# Patient Record
Sex: Male | Born: 1945 | Race: Black or African American | Hispanic: No | Marital: Single | Smoking: Current every day smoker
Health system: Southern US, Community
[De-identification: ages and names within clinical notes are randomized; demographics above are authoritative.]

## PROBLEM LIST (undated history)

## (undated) ENCOUNTER — Emergency Department (HOSPITAL_COMMUNITY): Payer: Medicare Other | Source: Home / Self Care

## (undated) DIAGNOSIS — R972 Elevated prostate specific antigen [PSA]: Secondary | ICD-10-CM

## (undated) DIAGNOSIS — I10 Essential (primary) hypertension: Secondary | ICD-10-CM

## (undated) DIAGNOSIS — Z951 Presence of aortocoronary bypass graft: Secondary | ICD-10-CM

## (undated) DIAGNOSIS — E162 Hypoglycemia, unspecified: Secondary | ICD-10-CM

## (undated) DIAGNOSIS — Z992 Dependence on renal dialysis: Secondary | ICD-10-CM

## (undated) DIAGNOSIS — M1990 Unspecified osteoarthritis, unspecified site: Secondary | ICD-10-CM

## (undated) DIAGNOSIS — K219 Gastro-esophageal reflux disease without esophagitis: Secondary | ICD-10-CM

## (undated) DIAGNOSIS — R0602 Shortness of breath: Secondary | ICD-10-CM

## (undated) DIAGNOSIS — N35919 Unspecified urethral stricture, male, unspecified site: Secondary | ICD-10-CM

## (undated) DIAGNOSIS — I4891 Unspecified atrial fibrillation: Secondary | ICD-10-CM

## (undated) DIAGNOSIS — C61 Malignant neoplasm of prostate: Secondary | ICD-10-CM

## (undated) DIAGNOSIS — I214 Non-ST elevation (NSTEMI) myocardial infarction: Secondary | ICD-10-CM

## (undated) DIAGNOSIS — R011 Cardiac murmur, unspecified: Secondary | ICD-10-CM

## (undated) DIAGNOSIS — K4090 Unilateral inguinal hernia, without obstruction or gangrene, not specified as recurrent: Secondary | ICD-10-CM

## (undated) DIAGNOSIS — N186 End stage renal disease: Secondary | ICD-10-CM

## (undated) DIAGNOSIS — N320 Bladder-neck obstruction: Secondary | ICD-10-CM

## (undated) DIAGNOSIS — Z720 Tobacco use: Secondary | ICD-10-CM

## (undated) DIAGNOSIS — E119 Type 2 diabetes mellitus without complications: Secondary | ICD-10-CM

## (undated) DIAGNOSIS — N189 Chronic kidney disease, unspecified: Secondary | ICD-10-CM

## (undated) DIAGNOSIS — I2510 Atherosclerotic heart disease of native coronary artery without angina pectoris: Secondary | ICD-10-CM

## (undated) DIAGNOSIS — E785 Hyperlipidemia, unspecified: Secondary | ICD-10-CM

## (undated) DIAGNOSIS — M2550 Pain in unspecified joint: Secondary | ICD-10-CM

## (undated) DIAGNOSIS — I509 Heart failure, unspecified: Secondary | ICD-10-CM

## (undated) HISTORY — DX: Chronic kidney disease, unspecified: N18.9

## (undated) HISTORY — DX: Essential (primary) hypertension: I10

## (undated) HISTORY — DX: Pain in unspecified joint: M25.50

## (undated) HISTORY — DX: Unspecified urethral stricture, male, unspecified site: N35.919

## (undated) HISTORY — DX: Bladder-neck obstruction: N32.0

## (undated) HISTORY — PX: KIDNEY SURGERY: SHX687

## (undated) HISTORY — DX: Elevated prostate specific antigen (PSA): R97.20

## (undated) HISTORY — PX: PELVIC LAPAROSCOPY: SHX162

## (undated) HISTORY — DX: Hyperlipidemia, unspecified: E78.5

## (undated) HISTORY — DX: Malignant neoplasm of prostate: C61

## (undated) HISTORY — PX: APPENDECTOMY: SHX54

---

## 2001-11-27 ENCOUNTER — Ambulatory Visit (HOSPITAL_COMMUNITY): Admission: RE | Admit: 2001-11-27 | Discharge: 2001-11-27 | Payer: Self-pay | Admitting: Family Medicine

## 2001-11-27 ENCOUNTER — Encounter: Payer: Self-pay | Admitting: Family Medicine

## 2002-10-22 ENCOUNTER — Emergency Department (HOSPITAL_COMMUNITY): Admission: EM | Admit: 2002-10-22 | Discharge: 2002-10-22 | Payer: Self-pay | Admitting: *Deleted

## 2003-04-23 ENCOUNTER — Ambulatory Visit (HOSPITAL_COMMUNITY): Admission: RE | Admit: 2003-04-23 | Discharge: 2003-04-23 | Payer: Self-pay | Admitting: Urology

## 2003-04-23 ENCOUNTER — Encounter (INDEPENDENT_AMBULATORY_CARE_PROVIDER_SITE_OTHER): Payer: Self-pay | Admitting: Specialist

## 2003-04-23 ENCOUNTER — Ambulatory Visit (HOSPITAL_BASED_OUTPATIENT_CLINIC_OR_DEPARTMENT_OTHER): Admission: RE | Admit: 2003-04-23 | Discharge: 2003-04-23 | Payer: Self-pay | Admitting: Urology

## 2003-09-19 ENCOUNTER — Ambulatory Visit: Payer: Self-pay | Admitting: Nurse Practitioner

## 2003-10-14 ENCOUNTER — Ambulatory Visit: Payer: Self-pay | Admitting: *Deleted

## 2003-10-22 ENCOUNTER — Ambulatory Visit: Payer: Self-pay | Admitting: Nurse Practitioner

## 2003-10-23 ENCOUNTER — Ambulatory Visit: Payer: Self-pay | Admitting: *Deleted

## 2004-05-20 ENCOUNTER — Ambulatory Visit: Payer: Self-pay | Admitting: Nurse Practitioner

## 2004-05-27 ENCOUNTER — Ambulatory Visit: Payer: Self-pay | Admitting: Nurse Practitioner

## 2004-06-19 ENCOUNTER — Emergency Department (HOSPITAL_COMMUNITY): Admission: EM | Admit: 2004-06-19 | Discharge: 2004-06-19 | Payer: Self-pay | Admitting: Emergency Medicine

## 2004-06-21 ENCOUNTER — Ambulatory Visit: Payer: Self-pay | Admitting: Nurse Practitioner

## 2004-11-30 ENCOUNTER — Ambulatory Visit: Payer: Self-pay | Admitting: Nurse Practitioner

## 2005-03-23 ENCOUNTER — Ambulatory Visit: Payer: Self-pay | Admitting: Nurse Practitioner

## 2005-04-25 ENCOUNTER — Ambulatory Visit: Payer: Self-pay | Admitting: Nurse Practitioner

## 2005-05-10 ENCOUNTER — Ambulatory Visit: Payer: Self-pay | Admitting: Internal Medicine

## 2005-10-14 ENCOUNTER — Ambulatory Visit: Payer: Self-pay | Admitting: Nurse Practitioner

## 2005-10-27 ENCOUNTER — Ambulatory Visit: Payer: Self-pay | Admitting: Nurse Practitioner

## 2005-11-10 ENCOUNTER — Ambulatory Visit: Payer: Self-pay | Admitting: Nurse Practitioner

## 2006-05-22 ENCOUNTER — Ambulatory Visit: Payer: Self-pay | Admitting: Nurse Practitioner

## 2006-06-14 ENCOUNTER — Ambulatory Visit: Payer: Self-pay | Admitting: Family Medicine

## 2006-09-27 ENCOUNTER — Encounter (INDEPENDENT_AMBULATORY_CARE_PROVIDER_SITE_OTHER): Payer: Self-pay | Admitting: *Deleted

## 2006-11-27 ENCOUNTER — Encounter (INDEPENDENT_AMBULATORY_CARE_PROVIDER_SITE_OTHER): Payer: Self-pay | Admitting: Nurse Practitioner

## 2006-11-27 ENCOUNTER — Ambulatory Visit: Payer: Self-pay | Admitting: Internal Medicine

## 2006-11-27 LAB — CONVERTED CEMR LAB
ALT: 17 units/L (ref 0–53)
AST: 19 units/L (ref 0–37)
Albumin: 4.5 g/dL (ref 3.5–5.2)
Alkaline Phosphatase: 104 units/L (ref 39–117)
BUN: 38 mg/dL — ABNORMAL HIGH (ref 6–23)
Basophils Absolute: 0 10*3/uL (ref 0.0–0.1)
Basophils Relative: 0 % (ref 0–1)
CO2: 23 meq/L (ref 19–32)
Calcium: 10.5 mg/dL (ref 8.4–10.5)
Chloride: 105 meq/L (ref 96–112)
Cholesterol: 151 mg/dL (ref 0–200)
Creatinine, Ser: 2.47 mg/dL — ABNORMAL HIGH (ref 0.40–1.50)
Eosinophils Absolute: 0.2 10*3/uL (ref 0.2–0.7)
Eosinophils Relative: 1 % (ref 0–5)
Glucose, Bld: 166 mg/dL — ABNORMAL HIGH (ref 70–99)
HCT: 47.3 % (ref 39.0–52.0)
HDL: 31 mg/dL — ABNORMAL LOW (ref >39)
Hemoglobin: 15.3 g/dL (ref 13.0–17.0)
LDL Cholesterol: 86 mg/dL (ref 0–99)
Lymphocytes Relative: 24 % (ref 12–46)
Lymphs Abs: 2.9 10*3/uL (ref 0.7–4.0)
MCHC: 32.3 g/dL (ref 30.0–36.0)
MCV: 98.5 fL (ref 78.0–100.0)
Monocytes Absolute: 0.6 10*3/uL (ref 0.1–1.0)
Monocytes Relative: 5 % (ref 3–12)
Neutro Abs: 8.6 10*3/uL — ABNORMAL HIGH (ref 1.7–7.7)
Neutrophils Relative %: 70 % (ref 43–77)
PSA: 2.86 ng/mL (ref 0.10–4.00)
Platelets: 240 10*3/uL (ref 150–400)
Potassium: 4.1 meq/L (ref 3.5–5.3)
RBC: 4.8 M/uL (ref 4.22–5.81)
RDW: 13.6 % (ref 11.5–15.5)
Sodium: 143 meq/L (ref 135–145)
TSH: 2.031 microintl units/mL (ref 0.350–5.50)
Total Bilirubin: 0.6 mg/dL (ref 0.3–1.2)
Total CHOL/HDL Ratio: 4.9
Total Protein: 7.2 g/dL (ref 6.0–8.3)
Triglycerides: 169 mg/dL — ABNORMAL HIGH (ref <150)
VLDL: 34 mg/dL (ref 0–40)
WBC: 12.2 10*3/uL — ABNORMAL HIGH (ref 4.0–10.5)

## 2006-11-28 ENCOUNTER — Encounter (INDEPENDENT_AMBULATORY_CARE_PROVIDER_SITE_OTHER): Payer: Self-pay | Admitting: Nurse Practitioner

## 2006-12-11 ENCOUNTER — Ambulatory Visit: Payer: Self-pay | Admitting: Internal Medicine

## 2007-01-03 ENCOUNTER — Ambulatory Visit: Payer: Self-pay | Admitting: Internal Medicine

## 2007-01-17 ENCOUNTER — Ambulatory Visit: Payer: Self-pay | Admitting: Internal Medicine

## 2007-01-17 ENCOUNTER — Encounter (INDEPENDENT_AMBULATORY_CARE_PROVIDER_SITE_OTHER): Payer: Self-pay | Admitting: Nurse Practitioner

## 2007-01-17 LAB — CONVERTED CEMR LAB
ALT: 12 units/L (ref 0–53)
AST: 14 units/L (ref 0–37)
Albumin: 4.2 g/dL (ref 3.5–5.2)
Alkaline Phosphatase: 88 units/L (ref 39–117)
BUN: 48 mg/dL — ABNORMAL HIGH (ref 6–23)
CO2: 22 meq/L (ref 19–32)
Calcium: 10.3 mg/dL (ref 8.4–10.5)
Chloride: 105 meq/L (ref 96–112)
Cholesterol: 172 mg/dL (ref 0–200)
Creatinine, Ser: 3.12 mg/dL — ABNORMAL HIGH (ref 0.40–1.50)
Glucose, Bld: 243 mg/dL — ABNORMAL HIGH (ref 70–99)
HDL: 37 mg/dL — ABNORMAL LOW (ref >39)
LDL Cholesterol: 103 mg/dL — ABNORMAL HIGH (ref 0–99)
Potassium: 4.4 meq/L (ref 3.5–5.3)
Sodium: 141 meq/L (ref 135–145)
Total Bilirubin: 0.3 mg/dL (ref 0.3–1.2)
Total CHOL/HDL Ratio: 4.6
Total Protein: 6.7 g/dL (ref 6.0–8.3)
Triglycerides: 159 mg/dL — ABNORMAL HIGH (ref <150)
VLDL: 32 mg/dL (ref 0–40)

## 2007-02-07 ENCOUNTER — Ambulatory Visit: Payer: Self-pay | Admitting: Family Medicine

## 2007-02-22 ENCOUNTER — Ambulatory Visit: Payer: Self-pay | Admitting: Internal Medicine

## 2007-03-13 ENCOUNTER — Encounter (INDEPENDENT_AMBULATORY_CARE_PROVIDER_SITE_OTHER): Payer: Self-pay | Admitting: Nurse Practitioner

## 2007-03-13 ENCOUNTER — Ambulatory Visit: Payer: Self-pay | Admitting: Internal Medicine

## 2007-03-13 LAB — CONVERTED CEMR LAB
ALT: 10 units/L (ref 0–53)
AST: 14 units/L (ref 0–37)
Albumin: 4.6 g/dL (ref 3.5–5.2)
Alkaline Phosphatase: 63 units/L (ref 39–117)
Bilirubin, Direct: 0.1 mg/dL (ref 0.0–0.3)
Cholesterol: 144 mg/dL (ref 0–200)
HDL: 33 mg/dL — ABNORMAL LOW (ref >39)
Indirect Bilirubin: 0.3 mg/dL (ref 0.0–0.9)
LDL Cholesterol: 85 mg/dL (ref 0–99)
Total Bilirubin: 0.4 mg/dL (ref 0.3–1.2)
Total CHOL/HDL Ratio: 4.4
Total Protein: 7.1 g/dL (ref 6.0–8.3)
Triglycerides: 131 mg/dL (ref <150)
VLDL: 26 mg/dL (ref 0–40)

## 2007-04-05 ENCOUNTER — Ambulatory Visit: Payer: Self-pay | Admitting: Internal Medicine

## 2007-04-30 ENCOUNTER — Ambulatory Visit: Payer: Self-pay | Admitting: Internal Medicine

## 2007-04-30 ENCOUNTER — Encounter (INDEPENDENT_AMBULATORY_CARE_PROVIDER_SITE_OTHER): Payer: Self-pay | Admitting: Nurse Practitioner

## 2007-04-30 LAB — CONVERTED CEMR LAB
BUN: 36 mg/dL — ABNORMAL HIGH (ref 6–23)
CO2: 21 meq/L (ref 19–32)
Calcium: 10.3 mg/dL (ref 8.4–10.5)
Chloride: 110 meq/L (ref 96–112)
Creatinine, Ser: 2.66 mg/dL — ABNORMAL HIGH (ref 0.40–1.50)
Glucose, Bld: 117 mg/dL — ABNORMAL HIGH (ref 70–99)
Potassium: 4 meq/L (ref 3.5–5.3)
Sodium: 143 meq/L (ref 135–145)

## 2007-05-15 ENCOUNTER — Ambulatory Visit: Payer: Self-pay | Admitting: Internal Medicine

## 2007-06-01 ENCOUNTER — Ambulatory Visit (HOSPITAL_COMMUNITY): Admission: RE | Admit: 2007-06-01 | Discharge: 2007-06-01 | Payer: Self-pay | Admitting: Nephrology

## 2007-07-12 ENCOUNTER — Ambulatory Visit: Payer: Self-pay | Admitting: Internal Medicine

## 2007-07-12 ENCOUNTER — Encounter (INDEPENDENT_AMBULATORY_CARE_PROVIDER_SITE_OTHER): Payer: Self-pay | Admitting: Nurse Practitioner

## 2007-07-12 LAB — CONVERTED CEMR LAB
ALT: 10 units/L (ref 0–53)
AST: 17 units/L (ref 0–37)
Albumin ELP: 60 % (ref 55.8–66.1)
Albumin: 4.6 g/dL (ref 3.5–5.2)
Alkaline Phosphatase: 83 units/L (ref 39–117)
Alpha-1-Globulin: 4.1 % (ref 2.9–4.9)
Alpha-2-Globulin: 11.6 % (ref 7.1–11.8)
BUN: 46 mg/dL — ABNORMAL HIGH (ref 6–23)
Beta Globulin: 5.9 % (ref 4.7–7.2)
CO2: 27 meq/L (ref 19–32)
Calcium, Total (PTH): 10.5 mg/dL (ref 8.4–10.5)
Calcium: 10.5 mg/dL (ref 8.4–10.5)
Chloride: 106 meq/L (ref 96–112)
Creatinine, Ser: 3.02 mg/dL — ABNORMAL HIGH (ref 0.40–1.50)
Ferritin: 362 ng/mL — ABNORMAL HIGH (ref 22–322)
Gamma Globulin: 13.1 % (ref 11.1–18.8)
Glucose, Bld: 111 mg/dL — ABNORMAL HIGH (ref 70–99)
Hemoglobin: 12.9 g/dL — ABNORMAL LOW (ref 13.0–17.0)
Iron: 55 ug/dL (ref 42–165)
PTH: 114.5 pg/mL — ABNORMAL HIGH (ref 14.0–72.0)
Phosphorus: 3.4 mg/dL (ref 2.3–4.6)
Potassium: 4 meq/L (ref 3.5–5.3)
Saturation Ratios: 17 % — ABNORMAL LOW (ref 20–55)
Sodium: 143 meq/L (ref 135–145)
TIBC: 324 ug/dL (ref 215–435)
Total Bilirubin: 0.4 mg/dL (ref 0.3–1.2)
Total Protein, Serum Electrophoresis: 7.4 g/dL (ref 6.0–8.3)
Total Protein: 7.4 g/dL (ref 6.0–8.3)
UIBC: 269 ug/dL

## 2007-07-19 ENCOUNTER — Encounter (INDEPENDENT_AMBULATORY_CARE_PROVIDER_SITE_OTHER): Payer: Self-pay | Admitting: Nurse Practitioner

## 2007-07-19 LAB — CONVERTED CEMR LAB
Albumin, U: DETECTED %
Alpha 1, Urine: DETECTED % — AB
Alpha 2, Urine: DETECTED % — AB
Beta, Urine: DETECTED % — AB
Free Kappa Lt Chains,Ur: 8.78 mg/dL — ABNORMAL HIGH (ref 0.04–1.51)
Free Kappa/Lambda Ratio: 7.26 — ABNORMAL HIGH (ref 0.46–4.00)
Free Lambda Lt Chains,Ur: 1.21 mg/dL — ABNORMAL HIGH (ref 0.08–1.01)
Gamma Globulin, Urine: DETECTED % — AB
Time: 24
Total Protein, Urine-Ur/day: 1451 mg/24hr — ABNORMAL HIGH (ref 10–140)
Volume, Urine: 1800 mL

## 2007-10-08 ENCOUNTER — Encounter (INDEPENDENT_AMBULATORY_CARE_PROVIDER_SITE_OTHER): Payer: Self-pay | Admitting: Family Medicine

## 2007-10-08 ENCOUNTER — Ambulatory Visit: Payer: Self-pay | Admitting: Family Medicine

## 2007-10-08 LAB — CONVERTED CEMR LAB
BUN: 44 mg/dL — ABNORMAL HIGH (ref 6–23)
CO2: 23 meq/L (ref 19–32)
Calcium: 9.7 mg/dL (ref 8.4–10.5)
Chloride: 106 meq/L (ref 96–112)
Cholesterol: 139 mg/dL (ref 0–200)
Creatinine, Ser: 3.02 mg/dL — ABNORMAL HIGH (ref 0.40–1.50)
HDL: 34 mg/dL — ABNORMAL LOW (ref >39)
Total CHOL/HDL Ratio: 4.1

## 2007-11-07 ENCOUNTER — Ambulatory Visit: Payer: Self-pay | Admitting: Internal Medicine

## 2007-12-19 ENCOUNTER — Encounter (INDEPENDENT_AMBULATORY_CARE_PROVIDER_SITE_OTHER): Payer: Self-pay | Admitting: Family Medicine

## 2007-12-19 ENCOUNTER — Ambulatory Visit: Payer: Self-pay | Admitting: Internal Medicine

## 2007-12-19 LAB — CONVERTED CEMR LAB
Albumin: 4.4 g/dL (ref 3.5–5.2)
BUN: 43 mg/dL — ABNORMAL HIGH (ref 6–23)
CO2: 22 meq/L (ref 19–32)
Calcium: 9.9 mg/dL (ref 8.4–10.5)
Chloride: 106 meq/L (ref 96–112)
Glucose, Bld: 146 mg/dL — ABNORMAL HIGH (ref 70–99)
HDL: 35 mg/dL — ABNORMAL LOW (ref >39)
PSA: 3.96 ng/mL (ref 0.10–4.00)
Potassium: 4.1 meq/L (ref 3.5–5.3)
Triglycerides: 95 mg/dL (ref <150)

## 2008-02-04 ENCOUNTER — Ambulatory Visit: Payer: Self-pay | Admitting: Internal Medicine

## 2008-03-07 ENCOUNTER — Ambulatory Visit: Payer: Self-pay | Admitting: Internal Medicine

## 2008-03-14 ENCOUNTER — Ambulatory Visit: Payer: Self-pay | Admitting: Internal Medicine

## 2008-03-24 ENCOUNTER — Ambulatory Visit: Payer: Self-pay | Admitting: Internal Medicine

## 2008-03-24 ENCOUNTER — Encounter (INDEPENDENT_AMBULATORY_CARE_PROVIDER_SITE_OTHER): Payer: Self-pay | Admitting: Internal Medicine

## 2008-03-24 LAB — CONVERTED CEMR LAB
HDL: 35 mg/dL — ABNORMAL LOW (ref >39)
LDL Cholesterol: 104 mg/dL — ABNORMAL HIGH (ref 0–99)
Total CHOL/HDL Ratio: 4.6

## 2008-03-28 ENCOUNTER — Encounter (INDEPENDENT_AMBULATORY_CARE_PROVIDER_SITE_OTHER): Payer: Self-pay | Admitting: *Deleted

## 2008-04-02 ENCOUNTER — Ambulatory Visit: Payer: Self-pay | Admitting: Internal Medicine

## 2008-05-14 ENCOUNTER — Ambulatory Visit: Payer: Self-pay | Admitting: Internal Medicine

## 2008-06-11 ENCOUNTER — Ambulatory Visit: Payer: Self-pay | Admitting: Internal Medicine

## 2008-07-23 ENCOUNTER — Ambulatory Visit: Payer: Self-pay | Admitting: Internal Medicine

## 2008-09-17 ENCOUNTER — Ambulatory Visit: Payer: Self-pay | Admitting: Internal Medicine

## 2008-10-19 ENCOUNTER — Emergency Department (HOSPITAL_COMMUNITY): Admission: EM | Admit: 2008-10-19 | Discharge: 2008-10-19 | Payer: Self-pay | Admitting: Emergency Medicine

## 2008-12-17 ENCOUNTER — Ambulatory Visit: Payer: Self-pay | Admitting: Internal Medicine

## 2008-12-17 ENCOUNTER — Encounter (INDEPENDENT_AMBULATORY_CARE_PROVIDER_SITE_OTHER): Payer: Self-pay | Admitting: Internal Medicine

## 2008-12-17 LAB — CONVERTED CEMR LAB
Albumin: 4.5 g/dL (ref 3.5–5.2)
Basophils Absolute: 0 10*3/uL (ref 0.0–0.1)
Calcium: 10.3 mg/dL (ref 8.4–10.5)
Chloride: 107 meq/L (ref 96–112)
Creatinine, Ser: 3.86 mg/dL — ABNORMAL HIGH (ref 0.40–1.50)
Eosinophils Absolute: 0.2 10*3/uL (ref 0.0–0.7)
Lymphs Abs: 2.5 10*3/uL (ref 0.7–4.0)
MCV: 95.8 fL (ref 78.0–100.0)
Neutrophils Relative %: 65 % (ref 43–77)
PTH: 178.2 pg/mL — ABNORMAL HIGH (ref 14.0–72.0)
Phosphorus: 3 mg/dL (ref 2.3–4.6)
Platelets: 236 10*3/uL (ref 150–400)
WBC: 9.6 10*3/uL (ref 4.0–10.5)

## 2009-01-27 ENCOUNTER — Ambulatory Visit: Payer: Self-pay | Admitting: Family Medicine

## 2009-02-24 ENCOUNTER — Ambulatory Visit: Payer: Self-pay | Admitting: Internal Medicine

## 2009-02-24 LAB — CONVERTED CEMR LAB
BUN: 38 mg/dL — ABNORMAL HIGH (ref 6–23)
Calcium: 10.4 mg/dL (ref 8.4–10.5)
Creatinine, Ser: 3.44 mg/dL — ABNORMAL HIGH (ref 0.40–1.50)
Potassium: 4.2 meq/L (ref 3.5–5.3)

## 2009-04-14 ENCOUNTER — Ambulatory Visit: Payer: Self-pay | Admitting: Internal Medicine

## 2009-04-14 LAB — CONVERTED CEMR LAB
Calcium: 10.2 mg/dL (ref 8.4–10.5)
Potassium: 4.5 meq/L (ref 3.5–5.3)
Sodium: 143 meq/L (ref 135–145)

## 2009-04-22 ENCOUNTER — Ambulatory Visit: Payer: Self-pay | Admitting: Internal Medicine

## 2009-06-05 ENCOUNTER — Ambulatory Visit: Payer: Self-pay | Admitting: Internal Medicine

## 2009-06-05 LAB — CONVERTED CEMR LAB
CO2: 22 meq/L (ref 19–32)
Glucose, Bld: 136 mg/dL — ABNORMAL HIGH (ref 70–99)
Potassium: 4.1 meq/L (ref 3.5–5.3)
Sodium: 140 meq/L (ref 135–145)

## 2009-06-15 ENCOUNTER — Ambulatory Visit: Payer: Self-pay | Admitting: Internal Medicine

## 2009-06-15 LAB — CONVERTED CEMR LAB
CO2: 23 meq/L (ref 19–32)
CRP: 0.2 mg/dL (ref <0.6)
Calcium: 10.2 mg/dL (ref 8.4–10.5)
Chloride: 109 meq/L (ref 96–112)
Cholesterol: 137 mg/dL (ref 0–200)
Creatinine, Ser: 4.07 mg/dL — ABNORMAL HIGH (ref 0.40–1.50)
Glucose, Bld: 94 mg/dL (ref 70–99)
Total Bilirubin: 0.4 mg/dL (ref 0.3–1.2)
Total CHOL/HDL Ratio: 3.9
Total Protein: 7.3 g/dL (ref 6.0–8.3)
Triglycerides: 95 mg/dL (ref <150)
VLDL: 19 mg/dL (ref 0–40)

## 2010-01-10 HISTORY — PX: AV FISTULA PLACEMENT, BRACHIOCEPHALIC: SHX1207

## 2010-01-18 ENCOUNTER — Encounter (INDEPENDENT_AMBULATORY_CARE_PROVIDER_SITE_OTHER): Payer: Self-pay | Admitting: *Deleted

## 2010-01-18 LAB — CONVERTED CEMR LAB
BUN: 43 mg/dL — ABNORMAL HIGH (ref 6–23)
Calcium: 10.9 mg/dL — ABNORMAL HIGH (ref 8.4–10.5)
Chloride: 103 meq/L (ref 96–112)
Creatinine, Ser: 3.89 mg/dL — ABNORMAL HIGH (ref 0.40–1.50)
Ferritin: 199 ng/mL (ref 22–322)
Hemoglobin: 13.5 g/dL (ref 13.0–17.0)
Iron: 57 ug/dL (ref 42–165)
Phosphorus: 3.8 mg/dL (ref 2.3–4.6)
Saturation Ratios: 19 % — ABNORMAL LOW (ref 20–55)
TIBC: 305 ug/dL (ref 215–435)

## 2010-01-29 ENCOUNTER — Encounter (INDEPENDENT_AMBULATORY_CARE_PROVIDER_SITE_OTHER): Payer: Self-pay | Admitting: Family Medicine

## 2010-01-29 LAB — CONVERTED CEMR LAB: PSA: 4.52 ng/mL — ABNORMAL HIGH (ref <=4.00)

## 2010-01-30 ENCOUNTER — Encounter (INDEPENDENT_AMBULATORY_CARE_PROVIDER_SITE_OTHER): Payer: Self-pay | Admitting: Family Medicine

## 2010-02-11 ENCOUNTER — Ambulatory Visit: Admit: 2010-02-11 | Payer: Self-pay | Admitting: Vascular Surgery

## 2010-02-11 ENCOUNTER — Ambulatory Visit (INDEPENDENT_AMBULATORY_CARE_PROVIDER_SITE_OTHER): Payer: Self-pay | Admitting: Vascular Surgery

## 2010-02-11 ENCOUNTER — Encounter (INDEPENDENT_AMBULATORY_CARE_PROVIDER_SITE_OTHER): Payer: Self-pay

## 2010-02-11 DIAGNOSIS — Z01810 Encounter for preprocedural cardiovascular examination: Secondary | ICD-10-CM

## 2010-02-11 DIAGNOSIS — N186 End stage renal disease: Secondary | ICD-10-CM

## 2010-02-11 DIAGNOSIS — N184 Chronic kidney disease, stage 4 (severe): Secondary | ICD-10-CM

## 2010-02-19 NOTE — Procedures (Unsigned)
CEPHALIC VEIN MAPPING  INDICATION:  Stage IV chronic kidney disease.  HISTORY:  EXAM: The right cephalic vein is compressible.  Diameter measurements range from 0.21 to 0.60 cm.  The right basilic vein is compressible.  Diameter measurements range from 0.38 to 0.55 cm.  The left cephalic vein is compressible.  Diameter measurements range from 0.26 to 0.49 cm.  The left basilic vein is compressible.  Diameter measurements range from 0.15 to 0.49 cm.  See attached worksheet for all measurements.  IMPRESSION:  Patent bilateral cephalic and basilic veins with diameter measurements as described above.  ___________________________________________ Janetta Hora. Fields, MD  CH/MEDQ  D:  02/11/2010  T:  02/11/2010  Job:  [id number]

## 2010-02-22 NOTE — Assessment & Plan Note (Signed)
OFFICE VISIT  [name] [name], [name] [name] DOB:  08-26-1945                                       02/11/2010 QIONG#:[id number]  CHIEF COMPLAINT:  Needs dialysis access.  HISTORY OF PRESENT ILLNESS:  The patient is a 65 year old male referred by Dr. Kathrene Bongo for placement of long-term hemodialysis access.  The patient had no prior access procedures.  He is currently not on dialysis.  He is right-handed.  Most recent serum creatinine on the records reviewed from Dr. Jon Gills office was 3.89 on January 18, 2010.  CHRONIC MEDICAL PROBLEMS:  Hypertension, renal insufficiency, diabetes. These are currently controlled and followed by Dr. Kathrene Bongo.  He also has a history of elevated cholesterol.  SOCIAL HISTORY:  He is single.  He has 4 children.  He smokes 1 pack of cigarettes per day.  He does not consume alcohol regularly.  FAMILY HISTORY:  Remarkable for diabetes and renal failure in his brother.  REVIEW OF SYSTEMS:  A full 12-point review of systems was performed with the patient today.  This was remarkable for some pain in his legs with walking and some recent weight gain.  All other systems were negative.  MEDICATIONS:  Avapro, aspirin, clonidine, allopurinol, Norvasc, Actos, Protonix, Lipitor, vitamin E, Amaryl, lisinopril, metoprolol, TriCor, hydralazine and Lasix.  ALLERGIES:  He has known drug allergies.  PHYSICAL EXAMINATION:  Blood pressure is 158/89 in the left arm, heart rate is 88 and regular, oxygen saturation is 99% on room air, respirations 16.  HEENT:  Unremarkable.  Neck has 2+ carotid pulses without bruit.  Chest:  Clear to auscultation.  Cardiac exam is regular rate rhythm with a 3/6 systolic murmur heard best in the right side of the chest.  Abdomen is soft, nontender, nondistended.  No masses. Extremities:  He has 2+ radial and 2+ femoral pulses bilaterally. Musculoskeletal exam shows no major obvious joint  deformities. Neurologic exam shows symmetric upper extremity and lower extremity motor strength which is 5/5.  Skin has no open ulcers or rashes.  On exam of the upper extremities bilaterally on placement of the tourniquet, cephalic vein is difficult to palpate.  He had a vein mapping ultrasound today, which shows the cephalic vein in the forearm is fairly small and inconsistent bilaterally.  However, the upper arm cephalic vein was between 33 mm and 49 mm in diameter on the left side and between 32 mm and 68 mm in diameter of the right side. The basilic vein was also greater than 3 mm bilaterally.  I believe the best option for the patient initially would be placement of left brachiocephalic AV fistula.  We have scheduled this for March 02, 2010.  Risks, benefits, possible complications and procedure details were explained to the patient today including bit not limited to non- maturation of the fistula, infection, bleeding, ischemic steal.  He understands and agrees to proceed.    Janetta Hora. Shakoya Gilmore, MD Electronically Signed  CEF/MEDQ  D:  02/11/2010  T:  02/12/2010  Job:  [id number]  cc:   Cecille Aver, M.D.

## 2010-02-25 ENCOUNTER — Ambulatory Visit (HOSPITAL_COMMUNITY)
Admission: RE | Admit: 2010-02-25 | Discharge: 2010-02-25 | Disposition: A | Discharge status: Home / Self Care | Payer: Self-pay | Source: Ambulatory Visit | Attending: Vascular Surgery | Admitting: Vascular Surgery

## 2010-02-25 ENCOUNTER — Other Ambulatory Visit: Payer: Self-pay | Admitting: Vascular Surgery

## 2010-02-25 ENCOUNTER — Encounter (HOSPITAL_COMMUNITY)
Admission: RE | Admit: 2010-02-25 | Discharge: 2010-02-25 | Disposition: A | Discharge status: Home / Self Care | Payer: Self-pay | Source: Ambulatory Visit | Attending: Vascular Surgery | Admitting: Vascular Surgery

## 2010-02-25 DIAGNOSIS — Z01818 Encounter for other preprocedural examination: Secondary | ICD-10-CM | POA: Insufficient documentation

## 2010-02-25 DIAGNOSIS — N186 End stage renal disease: Secondary | ICD-10-CM

## 2010-02-25 DIAGNOSIS — I120 Hypertensive chronic kidney disease with stage 5 chronic kidney disease or end stage renal disease: Secondary | ICD-10-CM | POA: Insufficient documentation

## 2010-02-25 LAB — SURGICAL PCR SCREEN: Staphylococcus aureus: NEGATIVE

## 2010-02-25 LAB — POCT I-STAT 4, (NA,K, GLUC, HGB,HCT): Hemoglobin: 12.9 g/dL — ABNORMAL LOW (ref 13.0–17.0)

## 2010-03-02 ENCOUNTER — Ambulatory Visit (HOSPITAL_COMMUNITY)
Admission: RE | Admit: 2010-03-02 | Discharge: 2010-03-02 | Disposition: A | Discharge status: Home / Self Care | Payer: Self-pay | Source: Ambulatory Visit | Attending: Vascular Surgery | Admitting: Vascular Surgery

## 2010-03-02 DIAGNOSIS — Z01810 Encounter for preprocedural cardiovascular examination: Secondary | ICD-10-CM | POA: Insufficient documentation

## 2010-03-02 DIAGNOSIS — J449 Chronic obstructive pulmonary disease, unspecified: Secondary | ICD-10-CM | POA: Insufficient documentation

## 2010-03-02 DIAGNOSIS — N186 End stage renal disease: Secondary | ICD-10-CM | POA: Insufficient documentation

## 2010-03-02 DIAGNOSIS — I120 Hypertensive chronic kidney disease with stage 5 chronic kidney disease or end stage renal disease: Secondary | ICD-10-CM | POA: Insufficient documentation

## 2010-03-02 DIAGNOSIS — K219 Gastro-esophageal reflux disease without esophagitis: Secondary | ICD-10-CM | POA: Insufficient documentation

## 2010-03-02 DIAGNOSIS — F17200 Nicotine dependence, unspecified, uncomplicated: Secondary | ICD-10-CM | POA: Insufficient documentation

## 2010-03-02 DIAGNOSIS — J4489 Other specified chronic obstructive pulmonary disease: Secondary | ICD-10-CM | POA: Insufficient documentation

## 2010-03-02 DIAGNOSIS — E119 Type 2 diabetes mellitus without complications: Secondary | ICD-10-CM | POA: Insufficient documentation

## 2010-03-02 LAB — GLUCOSE, CAPILLARY
Glucose-Capillary: 157 mg/dL — ABNORMAL HIGH (ref 70–99)
Glucose-Capillary: 162 mg/dL — ABNORMAL HIGH (ref 70–99)

## 2010-03-03 NOTE — Op Note (Signed)
  NAME:  [name] [name], [name] [name]                ACCOUNT NO.:  [id number]  MEDICAL RECORD NO.:  [id number]           PATIENT TYPE:  O  LOCATION:  [facility]                         FACILITY:  [facility]  PHYSICIAN:  Janetta Hora. Fields, MD  DATE OF BIRTH:  [birth year]  DATE OF PROCEDURE:  03/02/2010 DATE OF DISCHARGE:                              OPERATIVE REPORT   PROCEDURE:  Left brachiocephalic AV fistula.  PREOPERATIVE DIAGNOSIS:  End-stage renal disease.  POSTOPERATIVE DIAGNOSIS:  End-stage renal disease.  ANESTHESIA:  Local with IV sedation.  ASSISTANT:  Pecola Leisure, PA  OPERATIVE FINDINGS:  3.5-mm cephalic vein.  OPERATIVE DETAILS:  After obtaining informed consent, the patient was taken to the operating room.  The patient was placed in supine position on the operating table.  After adequate sedation, the patient's entire left upper extremity was prepped and draped in usual sterile fashion. Local anesthesia was infiltrated near the left antecubital crease.  A transverse incision was made in this location and carried down through the subcutaneous tissues down to the level of the cephalic vein.  The cephalic vein was of good quality, approximately 3.5 mm in diameter. This was dissected free circumferentially.  Small side branches were ligated and divided between silk ties.  Next, brachial artery was dissected free in the medial portion incision.  This was also of good quality approximately 3.5-4 mm in diameter.  This was dissected free circumferentially and small side branch was ligated and divided between silk ties or clips.  The patient was given 5000 units of intravenous heparin.  The distal cephalic vein was ligated with 2-0 silk tie and the vein was transected and swung over the level of the artery.  The artery was then controlled proximally and distally with vessel loops.  A longitudinal opening was made in the brachial artery and vein was then sewn end of vein to side of artery using  a running 7-0 Prolene suture. Just prior to completion anastomosis, this was fore bled, back bled, and thoroughly flushed.  Anastomosis was secured.  Vessel loops were released.  There was a palpable thrill in the fistula immediately.  The patient also had a palpable radial pulse.  Hemostasis was obtained. Subcutaneous tissues were reapproximated using running 3-0 Vicryl suture.  The skin was closed with 4-0 Vicryl subcuticular stitch.  The patient tolerated the procedure well and there were no complications.  Instrument, sponge, and needle counts were correct at the end of the case.  The patient was taken to the recovery room in stable condition.     Janetta Hora. Fields, MD     CEF/MEDQ  D:  03/02/2010  T:  03/02/2010  Job:  [id number]  Electronically Signed by Fabienne Bruns MD on 03/03/2010 03:09:43 PM

## 2010-04-01 ENCOUNTER — Ambulatory Visit (INDEPENDENT_AMBULATORY_CARE_PROVIDER_SITE_OTHER): Payer: Self-pay

## 2010-04-01 DIAGNOSIS — N186 End stage renal disease: Secondary | ICD-10-CM

## 2010-04-01 NOTE — Assessment & Plan Note (Signed)
OFFICE VISIT  MERCED, HANNERS DOB:  June 27, 1945                                       04/01/2010 JXBJY#:[id number]  CHIEF COMPLAINT:  Followup left brachial cephalic AV fistula.  HISTORY OF PRESENT ILLNESS:  This patient is a 65 year old gentleman with chronic renal insufficiency and with a GFR of 25 who had a left brachial cephalic AV fistula placed on 30/86/5784 by Dr. Darrick Penna.  The patient has been doing well with no signs of steal.  He has good motion and sensation in his hand and is able to use it normally.  PHYSICAL EXAMINATION:  This is a well-developed, well-nourished gentleman in no acute distress.  His heart rate is 84, saturations are 96% and his respiratory rate is 10.  Bilateral upper extremities are warm and pink.  He has good strength in the left hand.  His left hand is warm and pink.  He has 3+ radial pulse.  He has a good thrill and bruit in the left upper arm AV fistula.  The vein is somewhat tortuous but easily palpable.  It is approximately 4 mm in size.  ASSESSMENT:  Well maturing left brachial cephalic AV fistula which is slightly tortuous.  There are no signs of steal in the left upper extremity.  The patient will follow up as needed as he is presently not on hemodialysis.  Della Goo, PA-C  Charles E. Fields, MD Electronically Signed  RR/MEDQ  D:  04/01/2010  T:  04/01/2010  Job:  [id number]

## 2010-05-28 NOTE — Op Note (Signed)
NAME:  [name] [name], BARBERI NO.:  [id number]   MEDICAL RECORD NO.:  [id number]                   PATIENT TYPE:  AMB   LOCATION:  [facility]                                 FACILITY:  [facility]   PHYSICIAN:  Maretta Bees. Vonita Moss, M.D.             DATE OF BIRTH:  10/11/1945   DATE OF PROCEDURE:  04/23/2003  DATE OF DISCHARGE:                                 OPERATIVE REPORT   PREOPERATIVE DIAGNOSES:  1. Phimosis.  2. Balanitis.   POSTOPERATIVE DIAGNOSES:  1. Phimosis.  2. Balanitis.   OPERATION/PROCEDURE:  Circumcision.   SURGEON:  Maretta Bees. Vonita Moss, M.D.   ASSISTANT:  Thyra Breed, M.D.   ANESTHESIA:  General endotracheal anesthesia.   DRAINS:  None.   COMPLICATIONS:  None.   INDICATIONS FOR PROCEDURE:  [name] is a very pleasant 65 year old male  with increasing inability to withdraw his foreskin over the past three  months.  In addition to his phimosis, he has noted occasional purulent  drainage as well as dysuria consistent with balanitis.  He has medical  problems including diabetes mellitus which may have likely lead to his  phimosis as well as hypertension.  At this time [name] has been consented  on the risks, benefits and alternatives of undergoing a circumcision.  The  patient understands these risks and is willing to proceed.   DESCRIPTION OF PROCEDURE:  Following identification by his arm bracelet, the  patient was brought to the operating room and placed in the supine position.  He received preoperative IV antibiotics and his genitalia were shaved.  His  genitalia were then prepped and draped in the usual sterile fashion.  The  patient's foreskin was unable to be retracted initially.  The marking pen  was used to create a circumcising incision along the outline of the corona  beneath the penile skin.  Scalpel was used to sharply incise the foreskin.  Bovie electrocautery was used to obtain hemostasis.  We then turned our  attention to the  distal aspect of the phallus.  Two hemostats were used to  elevate the foreskin away from the glans.  Metzenbaum scissors were then  used to incise the foreskin in the midline to the level of the glans.  Prior  to proceeding, the glans and inner foreskin were then prepped again on the  operating room table with Betadine solution.  The inner aspect of the  foreskin was quite rugated and not smooth in appearance.  This was likely as  a result of the patient's balanitis.  However, once the specimen was  removed, it was sent for pathologic analysis.  The remainder of the foreskin  was then affixed on all four corners by hemostats.  Metzenbaum scissors were  then used to sharply remove the intervening piece of foreskin.  Once the  specimen was passed from the table, Bovie electrocautery was used to obtain  hemostasis, isolating any  active or potential bleeding on the penile shaft.  Once excellent hemostasis was obtained, a 4-0 chromic suture was used to  first reapproximate the frenulum and then create a U stitch to affix the  ventral midline.  A second 4-0 interrupted chromic suture was used to affix  the foreskin to the corona of the glans and the ventral midline.  Lateral  interrupted chromic sutures were then placed.  The intervening segments were  then closed using running 4-0 chromic suture.  The penile shaft skin was  then injected with 0.5% Marcaine.  The penis was then washed and dried.  Vaseline gauze was used to loosely wrap the suture line.  Rolling gauze was  then applied.  Finally a piece of Coban was used to complete the dressing.  Vaseline and bacitracin were then applied to the tip of the penis.  This  marked termination of the procedure.  The patient tolerated the procedure  well and there were no complications.   Please note that Dr. Vonita Moss was present and participated in the entire  procedure as he was the responsible surgeon.   DISPOSITION:  After awakening from general  anesthesia, the patient was  transported to the post anesthesia care unit in stable condition.  From  there the patient would be discharged to home.     Thyra Breed, MD                            Maretta Bees. Vonita Moss, M.D.    EG/MEDQ  D:  04/23/2003  T:  04/23/2003  Job:  [id number]

## 2010-06-03 ENCOUNTER — Encounter (INDEPENDENT_AMBULATORY_CARE_PROVIDER_SITE_OTHER): Payer: Self-pay

## 2010-06-03 ENCOUNTER — Ambulatory Visit (INDEPENDENT_AMBULATORY_CARE_PROVIDER_SITE_OTHER): Payer: Self-pay | Admitting: Vascular Surgery

## 2010-06-03 DIAGNOSIS — T82598A Other mechanical complication of other cardiac and vascular devices and implants, initial encounter: Secondary | ICD-10-CM

## 2010-06-03 DIAGNOSIS — N186 End stage renal disease: Secondary | ICD-10-CM

## 2010-06-04 NOTE — Assessment & Plan Note (Signed)
OFFICE VISIT  [name] [name], [name] [name] DOB:  Jan 27, 1945                                       06/03/2010 EAVWU#:[id number]  The patient returns today for followup after placement of a left brachiocephalic AV fistula on 03/02/2010.  The fistula is not currently being used.  He denies any steal symptoms in the arm.  CHRONIC MEDICAL PROBLEMS:  Continue to remain renal insufficiency, hypertension and diabetes.  These are controlled and followed by Dr. Kathrene Bongo.  REVIEW OF SYSTEMS:  He denies any shortness of breath.  He denies any chest pain.  PHYSICAL EXAM:  Vital signs:  Blood pressure is 124/73 in the right arm, heart rate 72 and regular, respirations 18.  Left upper extremity: There is an easily palpable thrill in the fistula.  It is palpable throughout most of the upper arm.  Left hand is pink, warm and well- perfused.  Neurologic:  Shows intact sensation and motor function in the left hand.  He had a duplex ultrasound of the left upper extremity today to analyze his fistula.  I reviewed and interpreted the study.  This shows that the fistula diameter is between 6 and 10 mm.  The depth from the skin is less than 1 cm throughout its entire course.  At this point I believe the patient's fistula is mature and ready for use at any time necessary.  He will follow up with [place] on an as-needed basis.    Janetta Hora. Cedric Mcclaine, MD Electronically Signed  CEF/MEDQ  D:  06/03/2010  T:  06/04/2010  Job:  [id number]

## 2010-06-10 NOTE — Procedures (Unsigned)
VASCULAR LAB EXAM  INDICATION:  A 60-month follow-up of left arm AV fistula.  HISTORY:  EXAM:  Left arm AV fistula duplex.  IMPRESSION: 1. Patent left brachial to cephalic arteriovenous fistula with no     focal increase in velocity or internal narrowing noted at the     anastomosis level or within the inflow or outflow vessels. 2. The antegrade left radial artery appears within normal limits. 3. Depth, diameter, velocity, and outflow vein branch measurements are     noted on the attached worksheet.  ___________________________________________ Janetta Hora. Fields, MD  CH/MEDQ  D:  06/03/2010  T:  06/03/2010  Job:  [id number]

## 2010-08-17 ENCOUNTER — Ambulatory Visit (INDEPENDENT_AMBULATORY_CARE_PROVIDER_SITE_OTHER): Payer: Self-pay | Admitting: Surgery

## 2010-08-18 ENCOUNTER — Ambulatory Visit (INDEPENDENT_AMBULATORY_CARE_PROVIDER_SITE_OTHER): Payer: PRIVATE HEALTH INSURANCE | Admitting: General Surgery

## 2010-08-18 ENCOUNTER — Encounter (INDEPENDENT_AMBULATORY_CARE_PROVIDER_SITE_OTHER): Payer: Self-pay | Admitting: General Surgery

## 2010-08-18 VITALS — BP 130/64 | HR 84 | Ht 67.0 in | Wt 198.0 lb

## 2010-08-18 DIAGNOSIS — N2581 Secondary hyperparathyroidism of renal origin: Secondary | ICD-10-CM | POA: Insufficient documentation

## 2010-08-18 NOTE — Progress Notes (Signed)
[name] [name] is a 65 y.o. male.    Chief Complaint  Patient presents with  . Eval elevated PTH    HPI HPI 65 year old African American male with diabetes mellitus, hypertension, chronic renal insufficiency not yet on hemodialysis referred for elevated parathyroid hormone level. The patient is a very poor historian. He did not bring any of his medications today. Moreover he cannot recall any of his medications.  He does have hypertension and diabetes mellitus. He also has chronic renal insufficiency. He has undergone placement of a AV fistula in anticipation of impending dialysis. He denies any bone fractures or osteoporosis. It appears his parathyroid hormone elevation was detected on routine chemistries. He denies any polydipsia, constipation, anorexia, nausea, or muscle weakness. He denies any depression. He has a remote history of kidney stones. He states that his nephrologist is Dr. Kathrene Bongo.    The patient does not know his medications. However a list of his medications from a vascular surgery history and physical exam from February 2012 does not show any thiazide diuretics. His medications at that time included Avapro, aspirin, clonidine, allopurinol, Norvasc, Actos, Protonix, Lipitor, vitamin E, Amaryl, lisinopril, metoprolol, TriCor, hydralazine and Lasix.  Past Medical History  Diagnosis Date  . Diabetes mellitus   . Hyperlipidemia   . Hypertension   . Chronic renal insufficiency   . Joint pain     Past Surgical History  Procedure Date  . Appendectomy 65 yrs old    open  . Av fistula placement, brachiocephalic 2012    Family History  Problem Relation Age of Onset  . Diabetes Brother   . Kidney disease Brother     Social History History  Substance Use Topics  . Smoking status: Current Everyday Smoker -- 1.0 packs/day for 30 years  . Smokeless tobacco: Not on file  . Alcohol Use: No    No Known Allergies  Current Outpatient Prescriptions  Medication Sig  Dispense Refill  . furosemide (LASIX) 80 MG tablet Take 80 mg by mouth 2 (two) times daily.          Review of Systems Review of Systems  Constitutional: Negative for fever, chills, weight loss and malaise/fatigue.  HENT:       Has dentures but doesn't wear  Eyes: Positive for blurred vision (both eyes).  Respiratory: Negative for shortness of breath.        +DOE  Cardiovascular: Positive for leg swelling. Negative for chest pain, orthopnea and PND.  Gastrointestinal: Negative for diarrhea, constipation, blood in stool and melena.  Genitourinary: Negative for dysuria and urgency.       Remote h/o nephrolithiasis; CRI - not on HD yet  Musculoskeletal: Positive for joint pain (rt shoulder).  Neurological: Negative for dizziness, tremors, seizures and loss of consciousness.  Endo/Heme/Allergies: Negative for polydipsia. Does not bruise/bleed easily.  Psychiatric/Behavioral: Negative for depression.    Physical Exam Physical Exam  Vitals reviewed. Constitutional: He is oriented to person, place, and time. He appears well-developed and well-nourished. No distress.       obese  Eyes: Pupils are equal, round, and reactive to light.       Muddy sclera  Neck: Normal range of motion. Neck supple. No JVD present. No tracheal deviation present. No mass and no thyromegaly present.  Cardiovascular: Normal rate.   Murmur (Rt upper chest III/VI SEM) heard. Respiratory: Effort normal and breath sounds normal. No respiratory distress. He has no wheezes.  GI: Soft. Bowel sounds are normal. He exhibits no distension. There is  no tenderness. There is no guarding.    Musculoskeletal: Normal range of motion. He exhibits no edema.  Lymphadenopathy:    He has no cervical adenopathy.  Neurological: He is alert and oriented to person, place, and time.  Skin: Skin is warm and dry.          No calciphylaxis   Psychiatric: He has a normal mood and affect. His behavior is normal.     Blood pressure  130/64, pulse 84, height 5\' 7"  (1.702 m), weight 198 lb (89.812 kg).  Data reviewed: I reviewed the notes from [facility] dated July 29, 2010  I also reviewed lab work that was completed on June 21, 2010. Hemoglobin 12.4, sodium 141, potassium 3.6, chloride 105, bicarbonate 22, BUN 43, creatinine 4.12, albumin 4.6, calcium normal at 10.1, phosphorus normal at 3.5, parathyroid hormone 340.2. Iron studies within normal limits  I also reviewed history and physical done by Dr. Fabienne Bruns in February 2012   Assessment/Plan This 65 year old African American male with diabetes mellitus, hypertension, chronic renal insufficiency, and elevated parathyroid hormone level  most consistent with secondary hyperparathyroidism.  Given his renal insufficiency, his elevated parathyroid hormone level is most consistent with secondary hyperparathyroidism.  At this point I did not believe the patient would benefit from surgery.  However he needs medical management for his elevated parathyroid hormone level. This is generally done with Cinacalcet.  His elevated parathyroid hormone level should be medically managed by his nephrologist.  I will leave it up to his nephrologist to let us know when surgery is indicated.  I will see him on an as-needed basis.  Mary Sella. Andrey Campanile, MD [facility], [place]  [facility] M 08/18/2010, 10:42 AM

## 2011-01-06 ENCOUNTER — Emergency Department (HOSPITAL_COMMUNITY)
Admission: EM | Admit: 2011-01-06 | Discharge: 2011-01-06 | Disposition: A | Discharge status: Home / Self Care | Payer: Medicaid Other | Attending: Emergency Medicine | Admitting: Emergency Medicine

## 2011-01-06 ENCOUNTER — Encounter (HOSPITAL_COMMUNITY): Payer: Self-pay | Admitting: *Deleted

## 2011-01-06 DIAGNOSIS — E119 Type 2 diabetes mellitus without complications: Secondary | ICD-10-CM | POA: Insufficient documentation

## 2011-01-06 DIAGNOSIS — F17200 Nicotine dependence, unspecified, uncomplicated: Secondary | ICD-10-CM | POA: Insufficient documentation

## 2011-01-06 DIAGNOSIS — M109 Gout, unspecified: Secondary | ICD-10-CM | POA: Insufficient documentation

## 2011-01-06 DIAGNOSIS — N189 Chronic kidney disease, unspecified: Secondary | ICD-10-CM | POA: Insufficient documentation

## 2011-01-06 DIAGNOSIS — I129 Hypertensive chronic kidney disease with stage 1 through stage 4 chronic kidney disease, or unspecified chronic kidney disease: Secondary | ICD-10-CM | POA: Insufficient documentation

## 2011-01-06 DIAGNOSIS — M25579 Pain in unspecified ankle and joints of unspecified foot: Secondary | ICD-10-CM | POA: Insufficient documentation

## 2011-01-06 DIAGNOSIS — E785 Hyperlipidemia, unspecified: Secondary | ICD-10-CM | POA: Insufficient documentation

## 2011-01-06 MED ORDER — PREDNISONE 10 MG PO TABS: 40.0000 mg | ORAL_TABLET | Freq: Every day | ORAL | Status: AC

## 2011-01-06 MED ORDER — PREDNISONE 20 MG PO TABS
60.0000 mg | ORAL_TABLET | Freq: Once | ORAL | Status: AC
  Administered 2011-01-06: 60 mg via ORAL
  Filled 2011-01-06: qty 3

## 2011-01-06 MED ORDER — HYDROCODONE-ACETAMINOPHEN 5-325 MG PO TABS
2.0000 | ORAL_TABLET | Freq: Once | ORAL | Status: AC
  Administered 2011-01-06: 2 via ORAL
  Filled 2011-01-06: qty 2

## 2011-01-06 MED ORDER — HYDROCODONE-ACETAMINOPHEN 5-500 MG PO TABS: 1.0000 | ORAL_TABLET | Freq: Four times a day (QID) | ORAL | Status: AC | PRN

## 2011-01-06 NOTE — ED Notes (Signed)
Pt has dialysis graft maturing in left upper arm.

## 2011-01-06 NOTE — ED Notes (Signed)
Pt reports gout to right ankle. States flare up started that last night. States was able to walk on his ankle till this am.

## 2011-01-06 NOTE — ED Notes (Signed)
Patient states he has gout in his right foot, around the ankle. Patient denies he has fallen or twisted his ankle. Patient states he has a hx of gout in his feet and once in his leg. Right ankle appears swollen with nonpitting edema and feels warm to touch. Patient states this episode started yesterday abut mid-day and is continuing today. Patient states he has been off is medication for gout for about a year. Patient denies chest pain, n/v.

## 2011-01-06 NOTE — ED Provider Notes (Signed)
History     CSN: [id number]  Arrival date & time 01/06/11  6578   First MD Initiated Contact with Patient 01/06/11 [phone]      Chief Complaint  Patient presents with  . Gout    (Consider location/radiation/quality/duration/timing/severity/associated sxs/prior treatment) Patient is a 65 y.o. male presenting with ankle pain.  Ankle Pain  The incident occurred 2 days ago. The incident occurred at home. There was no injury mechanism. The pain is present in the right ankle. The quality of the pain is described as aching and sharp. The pain is at a severity of 6/10. The pain is moderate. The pain has been constant since onset. Pertinent negatives include no numbness, no muscle weakness, no loss of sensation and no tingling. Inability to bear weight: Painful to bear weight. He reports no foreign bodies present. The symptoms are aggravated by bearing weight. He has tried nothing for the symptoms. The treatment provided no relief.    Past Medical History  Diagnosis Date  . Diabetes mellitus   . Hyperlipidemia   . Hypertension   . Chronic renal insufficiency   . Joint pain   . Gout     Past Surgical History  Procedure Date  . Appendectomy 65 yrs old    open  . Av fistula placement, brachiocephalic 2012    Family History  Problem Relation Age of Onset  . Diabetes Brother   . Kidney disease Brother     History  Substance Use Topics  . Smoking status: Current Everyday Smoker -- 1.0 packs/day for 30 years  . Smokeless tobacco: Not on file  . Alcohol Use: No      Review of Systems  Constitutional: Negative for fever and chills.  Respiratory: Negative for shortness of breath.   Cardiovascular: Negative for chest pain.  Gastrointestinal: Negative for nausea, vomiting and abdominal pain.  Neurological: Negative for tingling and numbness.  All other systems reviewed and are negative.    Allergies  Review of patient's allergies indicates no known allergies.  Home  Medications   Current Outpatient Rx  Name Route Sig Dispense Refill  . AMLODIPINE BESYLATE 10 MG PO TABS Oral Take 10 mg by mouth daily.      . ASPIRIN EC 81 MG PO TBEC Oral Take 81 mg by mouth daily.      Marland Kitchen CALCITRIOL 0.25 MCG PO CAPS Oral Take 0.25 mcg by mouth daily.      Marland Kitchen CLONIDINE HCL 0.3 MG PO TABS Oral Take 0.3 mg by mouth 2 (two) times daily.      . [employer] ACID REDUCER PO Oral Take 1 tablet by mouth daily.      . FUROSEMIDE 40 MG PO TABS Oral Take 80 mg by mouth 2 (two) times daily.      Marland Kitchen HYDRALAZINE HCL 100 MG PO TABS Oral Take 100 mg by mouth 2 (two) times daily.      Marland Kitchen METOPROLOL TARTRATE 25 MG PO TABS Oral Take 25 mg by mouth 2 (two) times daily.      Marland Kitchen TAMSULOSIN HCL 0.4 MG PO CAPS Oral Take 0.4 mg by mouth daily.      Marland Kitchen HYDROCODONE-ACETAMINOPHEN 5-500 MG PO TABS Oral Take 1-2 tablets by mouth every 6 (six) hours as needed for pain. 15 tablet 0  . PREDNISONE 10 MG PO TABS Oral Take 4 tablets (40 mg total) by mouth daily. 16 tablet 0    BP 142/73  Pulse 77  Temp(Src) 98.6 F (37 C) (Oral)  Resp 18  SpO2 95%  Physical Exam  Constitutional: He is oriented to person, place, and time. He appears well-developed and well-nourished. No distress.  HENT:  Head: Normocephalic and atraumatic.  Mouth/Throat: No oropharyngeal exudate.  Eyes: EOM are normal. Pupils are equal, round, and reactive to light.  Neck: Normal range of motion. Neck supple.  Cardiovascular: Normal rate and regular rhythm.  Exam reveals no friction rub.   No murmur heard. Pulmonary/Chest: Effort normal and breath sounds normal. No respiratory distress. He has no wheezes. He has no rales.  Abdominal: He exhibits no distension. There is no tenderness. There is no rebound.  Musculoskeletal: Normal range of motion. He exhibits no edema.       Right angle is mildly swollen. DP pulses intact bilaterally and 2+. Range of motion limited right ankle secondary to pain. No increased warmth compared to the contralateral  side. No erythema or large joint effusion appreciated.  Neurological: He is alert and oriented to person, place, and time.  Skin: He is not diaphoretic.    ED Course  Procedures (including critical care time)  Labs Reviewed - No data to display No results found.   1. Gout attack       MDM  64 year old male presents with right ankle and foot pain. States the pain feels like a gout flare. Has history of gout in same location previously. Denies fever, vomiting, or other systemic symptoms. Afebrile vital signs stable the emergency department. Right ankle is mildly smaller compared to the left, however no increased warmth. No erythema to the ankle. Good distal pulses and range of motion intact and ankle. Able to bear weight however it is difficult and hurts. No clinical concern for septic joint. We'll treat her with prednisone and Vicodin. Given a prescription for both and first dose of prednisone here. Instructed to followup with his regular doctor in 2 days for repeat    Will O. Derald Macleod, MD 01/06/11 [phone]

## 2011-01-08 NOTE — ED Provider Notes (Signed)
I saw and evaluated the patient, reviewed the resident's note and I agree with the findings and plan. C/o ankle pain, same as prior gout pain. No cellulitis. No fever.   Suzi Roots, MD 01/08/11 [phone]

## 2011-05-28 ENCOUNTER — Emergency Department (HOSPITAL_COMMUNITY)
Admission: EM | Admit: 2011-05-28 | Discharge: 2011-05-28 | Disposition: A | Discharge status: Home / Self Care | Payer: Medicaid Other | Source: Home / Self Care | Attending: Emergency Medicine | Admitting: Emergency Medicine

## 2011-05-28 ENCOUNTER — Encounter (HOSPITAL_COMMUNITY): Payer: Self-pay | Admitting: Emergency Medicine

## 2011-05-28 DIAGNOSIS — M109 Gout, unspecified: Secondary | ICD-10-CM

## 2011-05-28 MED ORDER — COLCHICINE 0.6 MG PO TABS
0.6000 mg | ORAL_TABLET | Freq: Two times a day (BID) | ORAL | Status: DC
Start: 2011-05-28 — End: 2011-09-21

## 2011-05-28 MED ORDER — INDOMETHACIN 25 MG PO CAPS: 25.0000 mg | ORAL_CAPSULE | Freq: Three times a day (TID) | ORAL | Status: AC

## 2011-05-28 MED ORDER — HYDROCODONE-ACETAMINOPHEN 5-325 MG PO TABS: 1.0000 | ORAL_TABLET | Freq: Four times a day (QID) | ORAL | Status: DC | PRN

## 2011-05-28 NOTE — ED Notes (Signed)
Right ankle pain, onset 05/26/2011.  Increasing pain, now extremely painful.  History of gout.

## 2011-05-28 NOTE — ED Provider Notes (Signed)
History     CSN: [id number]  Arrival date & time 05/28/11  1028   First MD Initiated Contact with Patient 05/28/11 1029      Chief Complaint  Patient presents with  . Ankle Pain    (Consider location/radiation/quality/duration/timing/severity/associated sxs/prior treatment) HPI Comments: Patient described that since Thursday started having pain on the lateral aspect of his right ankle, unrelated to any twisting, injury or fall. "It just started hurting, it's worse when I walk on it or move my ankle." It it feels like, when I had gout... it's very tender when I touch it or walk on it.  Patient denies any, weakness, tingling or numbness sensation or constitutional symptoms such as fevers, malaise fatigue or changes in appetite.  The history is provided by the patient and the spouse.    Past Medical History  Diagnosis Date  . Diabetes mellitus   . Hyperlipidemia   . Hypertension   . Chronic renal insufficiency   . Joint pain   . Gout     Past Surgical History  Procedure Date  . Appendectomy 66 yrs old    open  . Av fistula placement, brachiocephalic 2012    Family History  Problem Relation Age of Onset  . Diabetes Brother   . Kidney disease Brother     History  Substance Use Topics  . Smoking status: Current Everyday Smoker -- 1.0 packs/day for 30 years  . Smokeless tobacco: Not on file  . Alcohol Use: No      Review of Systems  Constitutional: Negative for fever, chills, activity change and appetite change.  Musculoskeletal: Positive for joint swelling. Negative for myalgias.  Skin: Negative for pallor and rash.    Allergies  Review of patient's allergies indicates no known allergies.  Home Medications   Current Outpatient Rx  Name Route Sig Dispense Refill  . AMLODIPINE BESYLATE 10 MG PO TABS Oral Take 10 mg by mouth daily.      . ASPIRIN EC 81 MG PO TBEC Oral Take 81 mg by mouth daily.      Marland Kitchen CALCITRIOL 0.25 MCG PO CAPS Oral Take 0.25 mcg by mouth  daily.      Marland Kitchen CLONIDINE HCL 0.3 MG PO TABS Oral Take 0.3 mg by mouth 2 (two) times daily.      . COLCHICINE 0.6 MG PO TABS Oral Take 1 tablet (0.6 mg total) by mouth 2 (two) times daily. 14 tablet 0  . [facility]S ACID REDUCER PO Oral Take 1 tablet by mouth daily.      . FUROSEMIDE 40 MG PO TABS Oral Take 80 mg by mouth 2 (two) times daily.      Marland Kitchen HYDRALAZINE HCL 100 MG PO TABS Oral Take 100 mg by mouth 2 (two) times daily.      Marland Kitchen HYDROCODONE-ACETAMINOPHEN 5-325 MG PO TABS Oral Take 1 tablet by mouth every 6 (six) hours as needed. 15 tablet 0  . INDOMETHACIN 25 MG PO CAPS Oral Take 1 capsule (25 mg total) by mouth 3 (three) times daily. 21 capsule 0  . METOPROLOL TARTRATE 25 MG PO TABS Oral Take 25 mg by mouth 2 (two) times daily.      Marland Kitchen TAMSULOSIN HCL 0.4 MG PO CAPS Oral Take 0.4 mg by mouth daily.        BP 163/72  Pulse 88  Temp(Src) 98.7 F (37.1 C) (Oral)  Resp 16  SpO2 95%  Physical Exam  Constitutional: He appears well-developed and well-nourished.  HENT:  Head: Normocephalic.  Eyes: Conjunctivae are normal.  Musculoskeletal:       Right ankle: He exhibits decreased range of motion and swelling. He exhibits no ecchymosis, no deformity, no laceration and normal pulse. tenderness. Lateral malleolus tenderness found. Achilles tendon exhibits no pain and no defect.       Feet:  Neurological: He is alert.  Skin: There is erythema.    ED Course  Procedures (including critical care time)  Labs Reviewed - No data to display No results found.   1. Gout       MDM  Exam and history were consistent with a flareup of gout. It's been less than 48 hours of sudden onset of right ankle swelling (non-trauma related).          Jimmie Molly, MD 05/28/11 [phone]

## 2011-05-28 NOTE — Discharge Instructions (Signed)
Try to keep your affected ankle elevated as much as possible and take this medicines as prescribed. Have printed some materials to familiarized herself with some diet modifications that could be helpful in trying to prevent future episodes. Any concerns or changes or worsening return for followup or see your primary care Dr.   Gout Gout is an inflammatory condition (arthritis) caused by a buildup of uric acid crystals in the joints. Uric acid is a chemical that is normally present in the blood. Under some circumstances, uric acid can form into crystals in your joints. This causes joint redness, soreness, and swelling (inflammation). Repeat attacks are common. Over time, uric acid crystals can form into masses (tophi) near a joint, causing disfigurement. Gout is treatable and often preventable. CAUSES  The disease begins with elevated levels of uric acid in the blood. Uric acid is produced by your body when it breaks down a naturally found substance called purines. This also happens when you eat certain foods such as meats and fish. Causes of an elevated uric acid level include:  Being passed down from parent to child (heredity).   Diseases that cause increased uric acid production (obesity, psoriasis, some cancers).   Excessive alcohol use.   Diet, especially diets rich in meat and seafood.   Medicines, including certain cancer-fighting drugs (chemotherapy), diuretics, and aspirin.   Chronic kidney disease. The kidneys are no longer able to remove uric acid well.   Problems with metabolism.  Conditions strongly associated with gout include:  Obesity.   High blood pressure.   High cholesterol.   Diabetes.  Not everyone with elevated uric acid levels gets gout. It is not understood why some people get gout and others do not. Surgery, joint injury, and eating too much of certain foods are some of the factors that can lead to gout. SYMPTOMS   An attack of gout comes on quickly. It  causes intense pain with redness, swelling, and warmth in a joint.   Fever can occur.   Often, only one joint is involved. Certain joints are more commonly involved:   Base of the big toe.   Knee.   Ankle.   Wrist.   Finger.  Without treatment, an attack usually goes away in a few days to weeks. Between attacks, you usually will not have symptoms, which is different from many other forms of arthritis. DIAGNOSIS  Your caregiver will suspect gout based on your symptoms and exam. Removal of fluid from the joint (arthrocentesis) is done to check for uric acid crystals. Your caregiver will give you a medicine that numbs the area (local anesthetic) and use a needle to remove joint fluid for exam. Gout is confirmed when uric acid crystals are seen in joint fluid, using a special microscope. Sometimes, blood, urine, and X-ray tests are also used. TREATMENT  There are 2 phases to gout treatment: treating the sudden onset (acute) attack and preventing attacks (prophylaxis). Treatment of an Acute Attack  Medicines are used. These include anti-inflammatory medicines or steroid medicines.   An injection of steroid medicine into the affected joint is sometimes necessary.   The painful joint is rested. Movement can worsen the arthritis.   You may use warm or cold treatments on painful joints, depending which works best for you.   Discuss the use of coffee, vitamin C, or cherries with your caregiver. These may be helpful treatment options.  Treatment to Prevent Attacks After the acute attack subsides, your caregiver may advise prophylactic medicine. These  medicines either help your kidneys eliminate uric acid from your body or decrease your uric acid production. You may need to stay on these medicines for a very long time. The early phase of treatment with prophylactic medicine can be associated with an increase in acute gout attacks. For this reason, during the first few months of treatment, your  caregiver may also advise you to take medicines usually used for acute gout treatment. Be sure you understand your caregiver's directions. You should also discuss dietary treatment with your caregiver. Certain foods such as meats and fish can increase uric acid levels. Other foods such as dairy can decrease levels. Your caregiver can give you a list of foods to avoid. HOME CARE INSTRUCTIONS   Do not take aspirin to relieve pain. This raises uric acid levels.   Only take over-the-counter or prescription medicines for pain, discomfort, or fever as directed by your caregiver.   Rest the joint as much as possible. When in bed, keep sheets and blankets off painful areas.   Keep the affected joint raised (elevated).   Use crutches if the painful joint is in your leg.   Drink enough water and fluids to keep your urine clear or pale yellow. This helps your body get rid of uric acid. Do not drink alcoholic beverages. They slow the passage of uric acid.   Follow your caregiver's dietary instructions. Pay careful attention to the amount of protein you eat. Your daily diet should emphasize fruits, vegetables, whole grains, and fat-free or low-fat milk products.   Maintain a healthy body weight.  SEEK MEDICAL CARE IF:   You have an oral temperature above 102 F (38.9 C).   You develop diarrhea, vomiting, or any side effects from medicines.   You do not feel better in 24 hours, or you are getting worse.  SEEK IMMEDIATE MEDICAL CARE IF:   Your joint becomes suddenly more tender and you have:   Chills.   An oral temperature above 102 F (38.9 C), not controlled by medicine.  MAKE SURE YOU:   Understand these instructions.   Will watch your condition.   Will get help right away if you are not doing well or get worse.  Document Released: 12/25/1999 Document Revised: 12/16/2010 Document Reviewed: 04/06/2009 [facility] Patient Information 2012 [place], [place].Gout Gout is an inflammatory  condition (arthritis) caused by a buildup of uric acid crystals in the joints. Uric acid is a chemical that is normally present in the blood. Under some circumstances, uric acid can form into crystals in your joints. This causes joint redness, soreness, and swelling (inflammation). Repeat attacks are common. Over time, uric acid crystals can form into masses (tophi) near a joint, causing disfigurement. Gout is treatable and often preventable. CAUSES  The disease begins with elevated levels of uric acid in the blood. Uric acid is produced by your body when it breaks down a naturally found substance called purines. This also happens when you eat certain foods such as meats and fish. Causes of an elevated uric acid level include:  Being passed down from parent to child (heredity).   Diseases that cause increased uric acid production (obesity, psoriasis, some cancers).   Excessive alcohol use.   Diet, especially diets rich in meat and seafood.   Medicines, including certain cancer-fighting drugs (chemotherapy), diuretics, and aspirin.   Chronic kidney disease. The kidneys are no longer able to remove uric acid well.   Problems with metabolism.  Conditions strongly associated with gout include:  Obesity.  High blood pressure.   High cholesterol.   Diabetes.  Not everyone with elevated uric acid levels gets gout. It is not understood why some people get gout and others do not. Surgery, joint injury, and eating too much of certain foods are some of the factors that can lead to gout. SYMPTOMS   An attack of gout comes on quickly. It causes intense pain with redness, swelling, and warmth in a joint.   Fever can occur.   Often, only one joint is involved. Certain joints are more commonly involved:   Base of the big toe.   Knee.   Ankle.   Wrist.   Finger.  Without treatment, an attack usually goes away in a few days to weeks. Between attacks, you usually will not have symptoms,  which is different from many other forms of arthritis. DIAGNOSIS  Your caregiver will suspect gout based on your symptoms and exam. Removal of fluid from the joint (arthrocentesis) is done to check for uric acid crystals. Your caregiver will give you a medicine that numbs the area (local anesthetic) and use a needle to remove joint fluid for exam. Gout is confirmed when uric acid crystals are seen in joint fluid, using a special microscope. Sometimes, blood, urine, and X-ray tests are also used. TREATMENT  There are 2 phases to gout treatment: treating the sudden onset (acute) attack and preventing attacks (prophylaxis). Treatment of an Acute Attack  Medicines are used. These include anti-inflammatory medicines or steroid medicines.   An injection of steroid medicine into the affected joint is sometimes necessary.   The painful joint is rested. Movement can worsen the arthritis.   You may use warm or cold treatments on painful joints, depending which works best for you.   Discuss the use of coffee, vitamin C, or cherries with your caregiver. These may be helpful treatment options.  Treatment to Prevent Attacks After the acute attack subsides, your caregiver may advise prophylactic medicine. These medicines either help your kidneys eliminate uric acid from your body or decrease your uric acid production. You may need to stay on these medicines for a very long time. The early phase of treatment with prophylactic medicine can be associated with an increase in acute gout attacks. For this reason, during the first few months of treatment, your caregiver may also advise you to take medicines usually used for acute gout treatment. Be sure you understand your caregiver's directions. You should also discuss dietary treatment with your caregiver. Certain foods such as meats and fish can increase uric acid levels. Other foods such as dairy can decrease levels. Your caregiver can give you a list of foods to  avoid. HOME CARE INSTRUCTIONS   Do not take aspirin to relieve pain. This raises uric acid levels.   Only take over-the-counter or prescription medicines for pain, discomfort, or fever as directed by your caregiver.   Rest the joint as much as possible. When in bed, keep sheets and blankets off painful areas.   Keep the affected joint raised (elevated).   Use crutches if the painful joint is in your leg.   Drink enough water and fluids to keep your urine clear or pale yellow. This helps your body get rid of uric acid. Do not drink alcoholic beverages. They slow the passage of uric acid.   Follow your caregiver's dietary instructions. Pay careful attention to the amount of protein you eat. Your daily diet should emphasize fruits, vegetables, whole grains, and fat-free or low-fat milk products.  Maintain a healthy body weight.  SEEK MEDICAL CARE IF:   You have an oral temperature above 102 F (38.9 C).   You develop diarrhea, vomiting, or any side effects from medicines.   You do not feel better in 24 hours, or you are getting worse.  SEEK IMMEDIATE MEDICAL CARE IF:   Your joint becomes suddenly more tender and you have:   Chills.   An oral temperature above 102 F (38.9 C), not controlled by medicine.  MAKE SURE YOU:   Understand these instructions.   Will watch your condition.   Will get help right away if you are not doing well or get worse.  Document Released: 12/25/1999 Document Revised: 12/16/2010 Document Reviewed: 04/06/2009 [employer] Patient Information 2012 [employer], [employer]Gout Gout is caused by a buildup of uric acid crystals in the joints. The crystals make your joints sore. This is like having sand in your joints. Repeat attacks are common. Gout can be treated. HOME CARE   Do not take aspirin for pain.   Only take medicine as told by your doctor.   You may use cold treatments (ice) on painful joints.   Put ice in a plastic bag.   Place a towel between  your skin and the bag.   Leave the ice on for 15 to 20 minutes at a time, 3 to 4 times a day.   Rest in bed as much as possible. When in bed, keep the sheets and blankets off your sore joints.   Keep the sore joints raised (elevated).   Use crutches if your legs or ankles hurt.   Drink enough water and fluids to keep your pee (urine) clear or pale yellow. This helps your body get rid of uric acid. Do not drink alcohol.   Follow diet instructions as told by your doctor.   Keep your body at a healthy weight.  GET HELP RIGHT AWAY IF:   You have a temperature by mouth above 102 F (38.9 C), not controlled by medicine.   You have watery poop (diarrhea).   You are throwing up (vomiting).   You do not feel better in 1 day, or you are getting worse.   Your joint hurts more.   You have the chills.  MAKE SURE YOU:   Understand these instructions.   Will watch your condition.   Will get help right away if you are not doing well or get worse.  Document Released: 10/06/2007 Document Revised: 12/16/2010 Document Reviewed: 04/06/2009 [facility] Patient Information 2012 [place], [place].Purine Restricted Diet A low-purine diet consists of foods that reduce uric acid made in your body. INDICATIONS FOR USE  Your caregiver may ask you to follow a low-purine diet to reduce gout flairs.  GUIDELINES  Avoid high-purine foods, including all alcohol, yeast extracts taken as supplements, and sauces made from meats (like gravy). Do not eat high-purine meats, including anchovies, sardines, herring, mussels, tuna, codfish, scallops, trout, haddock, bacon, organ meats, tripe, goose, wild game, and sweetbreads.  Grains  Allowed/Recommended: All, except those listed to consume in moderation.   Consume in Moderation: Oatmeal (? cup uncooked daily), wheat bran or germ ( cup daily), and whole grains.  Vegetables  Allowed/Recommended: All, except those listed to consume in moderation.   Consume in  Moderation: Asparagus, cauliflower, spinach, mushrooms, and green peas ( cup daily).  Fruit  Allowed/Recommended: All.   Consume in Moderation: None.  Meat and Meat Substitutes  Allowed/Recommended: Eggs, nuts, and peanut butter.   Consume in  Moderation: Limit to 4 to 6 oz daily. Avoid high-purine meats. Lentils, peas, and dried beans (1 cup daily).  Milk  Allowed/Recommended: All. Choose low-fat or skim when possible.   Consume in Moderation: None.  Fats and Oils  Allowed/Recommended: All.   Consume in Moderation: None.  Beverages  Allowed/Recommended: All, except those listed to avoid.   Avoid: All alcohol.  Condiments/Miscellaneous  Allowed/Recommended: All, except those listed to consume in moderation.   Consume in Moderation: Bouillon and meat-based broths and soups.  Document Released: 04/23/2010 Document Revised: 12/16/2010 Document Reviewed: 04/23/2010 [facility] Patient Information 2012 [place], [place].C.

## 2011-09-21 ENCOUNTER — Emergency Department (HOSPITAL_COMMUNITY)
Admission: EM | Admit: 2011-09-21 | Discharge: 2011-09-21 | Disposition: A | Discharge status: Home / Self Care | Payer: Medicaid Other | Source: Home / Self Care | Attending: Emergency Medicine | Admitting: Emergency Medicine

## 2011-09-21 ENCOUNTER — Encounter (HOSPITAL_COMMUNITY): Payer: Self-pay | Admitting: Emergency Medicine

## 2011-09-21 DIAGNOSIS — N289 Disorder of kidney and ureter, unspecified: Secondary | ICD-10-CM

## 2011-09-21 DIAGNOSIS — M109 Gout, unspecified: Secondary | ICD-10-CM

## 2011-09-21 LAB — POCT I-STAT, CHEM 8
BUN: 38 mg/dL — ABNORMAL HIGH (ref 6–23)
Calcium, Ion: 1.31 mmol/L — ABNORMAL HIGH (ref 1.13–1.30)
Chloride: 107 mEq/L (ref 96–112)
Glucose, Bld: 199 mg/dL — ABNORMAL HIGH (ref 70–99)

## 2011-09-21 MED ORDER — HYDROCODONE-ACETAMINOPHEN 5-325 MG PO TABS: 2.0000 | ORAL_TABLET | ORAL | Status: AC | PRN

## 2011-09-21 MED ORDER — METHYLPREDNISOLONE ACETATE 80 MG/ML IJ SUSP
80.0000 mg | Freq: Once | INTRAMUSCULAR | Status: AC
  Administered 2011-09-21: 80 mg via INTRAMUSCULAR

## 2011-09-21 MED ORDER — DICLOFENAC SODIUM 1 % TD GEL: 1.0000 "application " | Freq: Four times a day (QID) | TRANSDERMAL | Status: DC

## 2011-09-21 MED ORDER — COLCHICINE 0.6 MG PO TABS: ORAL_TABLET | ORAL | Status: DC

## 2011-09-21 MED ORDER — METHYLPREDNISOLONE ACETATE 80 MG/ML IJ SUSP
INTRAMUSCULAR | Status: AC
  Filled 2011-09-21: qty 1

## 2011-09-21 NOTE — ED Notes (Signed)
Prior hist of gout; "feels like gout again"

## 2011-09-21 NOTE — ED Provider Notes (Signed)
History     CSN: [id number]  Arrival date & time 09/21/11  1203   First MD Initiated Contact with Patient 09/21/11 1204      Chief Complaint  Patient presents with  . Foot Pain    (Consider location/radiation/quality/duration/timing/severity/associated sxs/prior treatment) HPI Comments: Patient reports atraumatic lateral right foot and right MTP hypersensitivity, pain, erythema consistent with previous gout flares starting 2 days ago. No nausea, vomiting, fevers. No deformity, paresthesias, bruising. Has been seen in ED and at the urgent care Center  2x this year with similar sx in his right ankle. States he has gout in both of his feet and ankles. He was prescribed colchicine at his last visit, which he states that he did not fill.   ROS as noted in HPI. All other ROS negative.   Patient is a 66 y.o. male presenting with lower extremity pain. The history is provided by the patient. No language interpreter was used.  Foot Pain This is a recurrent problem. The current episode started more than 2 days ago. The problem occurs constantly. The problem has not changed since onset.The symptoms are aggravated by walking and standing. Nothing relieves the symptoms. He has tried rest and a warm compress for the symptoms. The treatment provided no relief.    Past Medical History  Diagnosis Date  . Diabetes mellitus   . Hyperlipidemia   . Hypertension   . Chronic renal insufficiency   . Joint pain   . Gout     Past Surgical History  Procedure Date  . Appendectomy 66 yrs old    open  . Av fistula placement, brachiocephalic 2012    Family History  Problem Relation Age of Onset  . Diabetes Brother   . Kidney disease Brother     History  Substance Use Topics  . Smoking status: Current Every Day Smoker -- 1.0 packs/day for 30 years  . Smokeless tobacco: Not on file  . Alcohol Use: No      Review of Systems  Constitutional: Negative for fever.  Gastrointestinal: Negative for  nausea and vomiting.  Musculoskeletal: Positive for arthralgias.  Skin: Negative for rash.  Neurological: Negative for numbness.    Allergies  Review of patient's allergies indicates no known allergies.  Home Medications   Current Outpatient Rx  Name Route Sig Dispense Refill  . PANTOPRAZOLE SODIUM 40 MG PO TBEC Oral Take 40 mg by mouth daily.    Marland Kitchen AMLODIPINE BESYLATE 10 MG PO TABS Oral Take 10 mg by mouth daily.      . ASPIRIN EC 81 MG PO TBEC Oral Take 81 mg by mouth daily.      Marland Kitchen CALCITRIOL 0.25 MCG PO CAPS Oral Take 0.25 mcg by mouth daily.      Marland Kitchen CLONIDINE HCL 0.3 MG PO TABS Oral Take 0.3 mg by mouth 2 (two) times daily.      . COLCHICINE 0.6 MG PO TABS  2 tabs po x 1, then one tab po 1 hour later 6 tablet 0  . DICLOFENAC SODIUM 1 % TD GEL Topical Apply 1 application topically 4 (four) times daily. 100 g 0  . [employer] ACID REDUCER PO Oral Take 1 tablet by mouth daily.      Marland Kitchen HYDRALAZINE HCL 100 MG PO TABS Oral Take 100 mg by mouth 2 (two) times daily.      Marland Kitchen HYDROCODONE-ACETAMINOPHEN 5-325 MG PO TABS Oral Take 2 tablets by mouth every 4 (four) hours as needed for pain. 20 tablet  0  . METOPROLOL TARTRATE 25 MG PO TABS Oral Take 25 mg by mouth 2 (two) times daily.      Marland Kitchen TAMSULOSIN HCL 0.4 MG PO CAPS Oral Take 0.4 mg by mouth daily.        BP 113/65  Pulse 80  Temp 98.1 F (36.7 C) (Oral)  Resp 20  SpO2 96%  Physical Exam  Nursing note and vitals reviewed. Constitutional: He is oriented to person, place, and time. He appears well-developed and well-nourished.  HENT:  Head: Normocephalic and atraumatic.  Eyes: Conjunctivae normal and EOM are normal.  Neck: Normal range of motion.  Cardiovascular: Normal rate.   Pulmonary/Chest: Effort normal. No respiratory distress.  Abdominal: He exhibits no distension.  Musculoskeletal: Normal range of motion. He exhibits no edema.       Mild erythema, tenderness first left MTP. Sensation grossly intact. Patient able to wiggle all toes  actively.  Neurological: He is alert and oriented to person, place, and time. Coordination normal.  Skin: Skin is warm and dry.  Psychiatric: He has a normal mood and affect. His behavior is normal. Judgment and thought content normal.    ED Course  Procedures (including critical care time)  Labs Reviewed  POCT I-STAT, CHEM 8 - Abnormal; Notable for the following:    BUN 38 (*)     Creatinine, Ser 4.20 (*)     Glucose, Bld 199 (*)     Calcium, Ion 1.31 (*)     All other components within normal limits   No results found.   1. Gout flare   2. Renal insufficiency     Results for orders placed during the hospital encounter of 09/21/11  POCT I-STAT, CHEM 8      Component Value Range   Sodium 141  135 - 145 mEq/L   Potassium 3.8  3.5 - 5.1 mEq/L   Chloride 107  96 - 112 mEq/L   BUN 38 (*) 6 - 23 mg/dL   Creatinine, Ser 1.61 (*) 0.50 - 1.35 mg/dL   Glucose, Bld 096 (*) 70 - 99 mg/dL   Calcium, Ion 0.45 (*) 1.13 - 1.30 mmol/L   TCO2 23  0 - 100 mmol/L   Hemoglobin 13.6  13.0 - 17.0 g/dL   HCT 40.9  81.1 - 91.4 %     MDM  Presentation is consistent with gout flare. No evidence of septic joint. Will check i-STAT to guide NSAID therapy given patient's history of renal insufficiency. Depo-Medrol 80 mg IM x1 now.  Baseline BUN/creatinine 40s/high 3's-low 4's. NSAIDs contraindicated. Discontinuing his Lasix. Restarting colchicine, Norco as needed for pain. Topical Voltaren. Will have him followup with Dr. Kathrene Bongo, his nephrologist in 2 or 3 days. Discussed labs, MDM, and plan with patient. Signs and symptoms that should prompt his return to the ER. Patient agrees with plan.  Luiz Blare, MD 09/21/11 1319

## 2012-03-31 ENCOUNTER — Emergency Department (HOSPITAL_COMMUNITY)
Admission: EM | Admit: 2012-03-31 | Discharge: 2012-03-31 | Disposition: A | Discharge status: Home / Self Care | Payer: Medicaid Other | Attending: Emergency Medicine | Admitting: Emergency Medicine

## 2012-03-31 ENCOUNTER — Encounter (HOSPITAL_COMMUNITY): Payer: Self-pay | Admitting: Nurse Practitioner

## 2012-03-31 DIAGNOSIS — F17200 Nicotine dependence, unspecified, uncomplicated: Secondary | ICD-10-CM | POA: Insufficient documentation

## 2012-03-31 DIAGNOSIS — Z862 Personal history of diseases of the blood and blood-forming organs and certain disorders involving the immune mechanism: Secondary | ICD-10-CM | POA: Insufficient documentation

## 2012-03-31 DIAGNOSIS — Z79899 Other long term (current) drug therapy: Secondary | ICD-10-CM | POA: Insufficient documentation

## 2012-03-31 DIAGNOSIS — E119 Type 2 diabetes mellitus without complications: Secondary | ICD-10-CM | POA: Insufficient documentation

## 2012-03-31 DIAGNOSIS — Z8639 Personal history of other endocrine, nutritional and metabolic disease: Secondary | ICD-10-CM | POA: Insufficient documentation

## 2012-03-31 DIAGNOSIS — R040 Epistaxis: Secondary | ICD-10-CM

## 2012-03-31 DIAGNOSIS — Z7982 Long term (current) use of aspirin: Secondary | ICD-10-CM | POA: Insufficient documentation

## 2012-03-31 DIAGNOSIS — I129 Hypertensive chronic kidney disease with stage 1 through stage 4 chronic kidney disease, or unspecified chronic kidney disease: Secondary | ICD-10-CM | POA: Insufficient documentation

## 2012-03-31 DIAGNOSIS — N189 Chronic kidney disease, unspecified: Secondary | ICD-10-CM | POA: Insufficient documentation

## 2012-03-31 NOTE — ED Provider Notes (Signed)
History     CSN: [id number]  Arrival date & time 03/31/12  1101   First MD Initiated Contact with Patient 03/31/12 1126      Chief Complaint  Patient presents with  . Epistaxis    (Consider location/radiation/quality/duration/timing/severity/associated sxs/prior treatment) HPI Patient reports nosebleed from right nare last night lasting 30 minutes. He stopped the nosebleed by placing ice on his nose. He went to sleep. He had another nosebleed today lasting 30 or 40 minutes, again from right nare . Stop it by placing ice and packing his nose with cotton ball. He is presently asymptomatic. No other associated symptoms. Nothing makes symptoms better or worse. States "I feel great" Past Medical History  Diagnosis Date  . Diabetes mellitus   . Hyperlipidemia   . Hypertension   . Chronic renal insufficiency   . Joint pain   . Gout     Past Surgical History  Procedure Laterality Date  . Appendectomy  67 yrs old    open  . Av fistula placement, brachiocephalic  2012    Family History  Problem Relation Age of Onset  . Diabetes Brother   . Kidney disease Brother     History  Substance Use Topics  . Smoking status: Current Every Day Smoker -- 1.00 packs/day for 30 years  . Smokeless tobacco: Not on file  . Alcohol Use: No      Review of Systems  Constitutional: Negative.   HENT:       Epistaxis  Neurological: Negative.   Hematological: Negative.     Allergies  Review of patient's allergies indicates no known allergies.  Home Medications   Current Outpatient Rx  Name  Route  Sig  Dispense  Refill  . amLODipine (NORVASC) 10 MG tablet   Oral   Take 10 mg by mouth daily.           Marland Kitchen aspirin EC 81 MG tablet   Oral   Take 81 mg by mouth daily.           Marland Kitchen b complex-vitamin c-folic acid (NEPHRO-VITE) 0.8 MG TABS   Oral   Take 0.8 mg by mouth at bedtime.         . calcitRIOL (ROCALTROL) 0.25 MCG capsule   Oral   Take 0.25 mcg by mouth daily.            . cholecalciferol (VITAMIN D) 1000 UNITS tablet   Oral   Take 1,000 Units by mouth daily.         . cloNIDine (CATAPRES) 0.3 MG tablet   Oral   Take 0.3 mg by mouth 2 (two) times daily.           . diclofenac sodium (VOLTAREN) 1 % GEL   Topical   Apply 1 application topically 4 (four) times daily.   100 g   0   . furosemide (LASIX) 40 MG tablet   Oral   Take 40 mg by mouth daily.         . hydrALAZINE (APRESOLINE) 100 MG tablet   Oral   Take 100 mg by mouth 2 (two) times daily.           . metoprolol tartrate (LOPRESSOR) 25 MG tablet   Oral   Take 25 mg by mouth 2 (two) times daily.           Marland Kitchen omeprazole (PRILOSEC) 20 MG capsule   Oral   Take 20 mg by mouth daily.         Marland Kitchen  Tamsulosin HCl (FLOMAX) 0.4 MG CAPS   Oral   Take 0.4 mg by mouth daily.             BP 174/75  Pulse 62  Temp(Src) 97.2 F (36.2 C) (Oral)  Resp 16  SpO2 98%  Physical Exam  Nursing note and vitals reviewed. Constitutional: He appears well-developed and well-nourished.  HENT:  Head: Normocephalic and atraumatic.  Nose inspected with fiberoptic headlamp and nasal speculum. No dried blood nares no bleeding visualized. No bleeding site localized  Eyes: Conjunctivae are normal. Pupils are equal, round, and reactive to light.  Neck: Neck supple. No tracheal deviation present. No thyromegaly present.  Cardiovascular: Normal rate and regular rhythm.   No murmur heard. Pulmonary/Chest: Effort normal and breath sounds normal.  Abdominal: Soft. Bowel sounds are normal. He exhibits no distension. There is no tenderness.  Musculoskeletal: Normal range of motion. He exhibits no edema and no tenderness.  Left upper extremity with dialysis fistula with good thrill  Neurological: He is alert. Coordination normal.  Skin: Skin is warm and dry. No rash noted.  Psychiatric: He has a normal mood and affect.    ED Course  Procedures (including critical care time)  Labs Reviewed - No  data to display No results found.   No diagnosis found.    MDM  Patient's blood pressure mildly elevated. Did not take antihypertensive medications this morning. He was given his usual medications before leaving the ED. Plan home observation. Saline nasal spray. Return as needed. ENT referral.  Diagnosis epistaxis      Doug Sou, MD 03/31/12 1141

## 2012-03-31 NOTE — ED Notes (Signed)
ENT cart to bedside 

## 2012-03-31 NOTE — ED Notes (Signed)
Pt reports nosebleed last night for 20 minutes last night, controlled at home, then another nosebleed this am that he was able to control at home alos. Pt takes a daily asa.

## 2012-03-31 NOTE — ED Notes (Signed)
Pt has a dialysis access in Lt upper ARM for future plans for dialysis treatment.

## 2012-04-23 ENCOUNTER — Other Ambulatory Visit: Payer: Self-pay | Admitting: Urology

## 2012-05-04 ENCOUNTER — Encounter (HOSPITAL_COMMUNITY): Payer: Self-pay | Admitting: Pharmacy Technician

## 2012-05-09 ENCOUNTER — Ambulatory Visit (HOSPITAL_COMMUNITY)
Admission: RE | Admit: 2012-05-09 | Discharge: 2012-05-09 | Disposition: A | Discharge status: Home / Self Care | Payer: Medicare Other | Source: Ambulatory Visit | Attending: Urology | Admitting: Urology

## 2012-05-09 ENCOUNTER — Encounter (HOSPITAL_COMMUNITY)
Admission: RE | Admit: 2012-05-09 | Discharge: 2012-05-09 | Disposition: A | Discharge status: Home / Self Care | Payer: Medicare Other | Source: Ambulatory Visit | Attending: Urology | Admitting: Urology

## 2012-05-09 ENCOUNTER — Encounter (HOSPITAL_COMMUNITY): Payer: Self-pay

## 2012-05-09 DIAGNOSIS — Z01810 Encounter for preprocedural cardiovascular examination: Secondary | ICD-10-CM | POA: Insufficient documentation

## 2012-05-09 DIAGNOSIS — I10 Essential (primary) hypertension: Secondary | ICD-10-CM | POA: Insufficient documentation

## 2012-05-09 HISTORY — DX: Gastro-esophageal reflux disease without esophagitis: K21.9

## 2012-05-09 HISTORY — DX: Unspecified osteoarthritis, unspecified site: M19.90

## 2012-05-09 LAB — SURGICAL PCR SCREEN
MRSA, PCR: NEGATIVE
Staphylococcus aureus: NEGATIVE

## 2012-05-09 LAB — CBC
MCH: 31.9 pg (ref 26.0–34.0)
MCHC: 34.2 g/dL (ref 30.0–36.0)
Platelets: 291 10*3/uL (ref 150–400)

## 2012-05-09 LAB — BASIC METABOLIC PANEL
Calcium: 9.8 mg/dL (ref 8.4–10.5)
GFR calc Af Amer: 11 mL/min — ABNORMAL LOW (ref >90)
GFR calc non Af Amer: 10 mL/min — ABNORMAL LOW (ref >90)
Potassium: 4 mEq/L (ref 3.5–5.1)
Sodium: 141 mEq/L (ref 135–145)

## 2012-05-09 NOTE — Patient Instructions (Addendum)
22 [name] [name]  05/09/2012   Your procedure is scheduled on: 05/18/12  Report to [facility] at 0830 AM.  Call this number if you have problems the morning of surgery [phone]: [phone]   Remember:   Do not eat food or drink liquids After Midnight.     Take these medicines the morning of surgery with A SIP OF WATER: Norvasc, Metoprolol, lipitor, Clonidine, hydralazine   Do not wear jewelry, make-up or nail polish.  Do not wear lotions, powders, or perfumes. You may wear deodorant.  Do not shave 48 hours prior to surgery. Men may shave face and neck.  Do not bring valuables to the hospital.  Contacts, dentures or bridgework may not be worn into surgery.  Leave suitcase in the car. After surgery it may be brought to your room.  For patients admitted to the hospital, checkout time is 11:00 AM the day of discharge.    Please read over the following fact sheets that you were given: MRSA Information, incentive spirometry fact sheet, blood fact sheet.  Birdie Sons, RN  pre op nurse call if needed [phone]    FAILURE TO FOLLOW THESE INSTRUCTIONS MAY RESULT IN CANCELLATION OF YOUR SURGERY   Patient Signature: ___________________________________________

## 2012-05-18 ENCOUNTER — Inpatient Hospital Stay (HOSPITAL_COMMUNITY)
Admission: RE | Admit: 2012-05-18 | Discharge: 2012-05-20 | DRG: 707 | Disposition: A | Discharge status: Home / Self Care | Payer: Medicare Other | Source: Ambulatory Visit | Attending: Urology | Admitting: Urology

## 2012-05-18 ENCOUNTER — Ambulatory Visit (HOSPITAL_COMMUNITY): Payer: Medicare Other | Admitting: Registered Nurse

## 2012-05-18 ENCOUNTER — Encounter (HOSPITAL_COMMUNITY): Payer: Self-pay | Admitting: Registered Nurse

## 2012-05-18 ENCOUNTER — Encounter (HOSPITAL_COMMUNITY)
Admission: RE | Disposition: A | Discharge status: Home / Self Care | Payer: Self-pay | Source: Ambulatory Visit | Attending: Urology

## 2012-05-18 DIAGNOSIS — K219 Gastro-esophageal reflux disease without esophagitis: Secondary | ICD-10-CM | POA: Diagnosis present

## 2012-05-18 DIAGNOSIS — N186 End stage renal disease: Secondary | ICD-10-CM | POA: Diagnosis present

## 2012-05-18 DIAGNOSIS — I120 Hypertensive chronic kidney disease with stage 5 chronic kidney disease or end stage renal disease: Secondary | ICD-10-CM | POA: Diagnosis present

## 2012-05-18 DIAGNOSIS — K660 Peritoneal adhesions (postprocedural) (postinfection): Secondary | ICD-10-CM | POA: Diagnosis present

## 2012-05-18 DIAGNOSIS — M109 Gout, unspecified: Secondary | ICD-10-CM | POA: Diagnosis present

## 2012-05-18 DIAGNOSIS — Z992 Dependence on renal dialysis: Secondary | ICD-10-CM

## 2012-05-18 DIAGNOSIS — E785 Hyperlipidemia, unspecified: Secondary | ICD-10-CM | POA: Diagnosis present

## 2012-05-18 DIAGNOSIS — F17200 Nicotine dependence, unspecified, uncomplicated: Secondary | ICD-10-CM | POA: Diagnosis present

## 2012-05-18 DIAGNOSIS — E119 Type 2 diabetes mellitus without complications: Secondary | ICD-10-CM | POA: Diagnosis present

## 2012-05-18 DIAGNOSIS — C61 Malignant neoplasm of prostate: Principal | ICD-10-CM | POA: Diagnosis present

## 2012-05-18 HISTORY — PX: LYMPHADENECTOMY: SHX5960

## 2012-05-18 HISTORY — PX: ROBOT ASSISTED LAPAROSCOPIC RADICAL PROSTATECTOMY: SHX5141

## 2012-05-18 LAB — BASIC METABOLIC PANEL
BUN: 63 mg/dL — ABNORMAL HIGH (ref 6–23)
Calcium: 8.9 mg/dL (ref 8.4–10.5)
Creatinine, Ser: 5.27 mg/dL — ABNORMAL HIGH (ref 0.50–1.35)
GFR calc non Af Amer: 10 mL/min — ABNORMAL LOW (ref >90)
Glucose, Bld: 175 mg/dL — ABNORMAL HIGH (ref 70–99)

## 2012-05-18 LAB — TYPE AND SCREEN: ABO/RH(D): B POS

## 2012-05-18 LAB — HEMOGLOBIN AND HEMATOCRIT, BLOOD: Hemoglobin: 12 g/dL — ABNORMAL LOW (ref 13.0–17.0)

## 2012-05-18 LAB — GLUCOSE, CAPILLARY: Glucose-Capillary: 173 mg/dL — ABNORMAL HIGH (ref 70–99)

## 2012-05-18 SURGERY — ROBOTIC ASSISTED LAPAROSCOPIC RADICAL PROSTATECTOMY
Anesthesia: General | Wound class: Clean Contaminated

## 2012-05-18 MED ORDER — PROPOFOL 10 MG/ML IV BOLUS
INTRAVENOUS | Status: DC | PRN
  Administered 2012-05-18: 170 mg via INTRAVENOUS

## 2012-05-18 MED ORDER — ACETAMINOPHEN 10 MG/ML IV SOLN
INTRAVENOUS | Status: DC | PRN
  Administered 2012-05-18: 1000 mg via INTRAVENOUS

## 2012-05-18 MED ORDER — HYDRALAZINE HCL 100 MG PO TABS: 100.0000 mg | ORAL_TABLET | Freq: Two times a day (BID) | ORAL | Status: DC

## 2012-05-18 MED ORDER — ONDANSETRON HCL 4 MG/2ML IJ SOLN: 4.0000 mg | INTRAMUSCULAR | Status: DC | PRN

## 2012-05-18 MED ORDER — SODIUM CHLORIDE 0.9 % IV SOLN
INTRAVENOUS | Status: DC | PRN
  Administered 2012-05-18 (×2): via INTRAVENOUS

## 2012-05-18 MED ORDER — DEXTROSE-NACL 5-0.45 % IV SOLN
INTRAVENOUS | Status: DC
  Administered 2012-05-18 – 2012-05-19 (×2): via INTRAVENOUS

## 2012-05-18 MED ORDER — CEFAZOLIN SODIUM-DEXTROSE 2-3 GM-% IV SOLR
INTRAVENOUS | Status: AC
  Filled 2012-05-18: qty 50

## 2012-05-18 MED ORDER — MIDAZOLAM HCL 5 MG/5ML IJ SOLN
INTRAMUSCULAR | Status: DC | PRN
  Administered 2012-05-18: 1 mg via INTRAVENOUS

## 2012-05-18 MED ORDER — LIDOCAINE HCL (CARDIAC) 20 MG/ML IV SOLN
INTRAVENOUS | Status: DC | PRN
  Administered 2012-05-18: 40 mg via INTRAVENOUS

## 2012-05-18 MED ORDER — CIPROFLOXACIN HCL 500 MG PO TABS: 500.0000 mg | ORAL_TABLET | Freq: Two times a day (BID) | ORAL | Status: DC

## 2012-05-18 MED ORDER — CEFAZOLIN SODIUM-DEXTROSE 2-3 GM-% IV SOLR: 2.0000 g | INTRAVENOUS | Status: DC

## 2012-05-18 MED ORDER — SODIUM CHLORIDE 0.9 % IJ SOLN
INTRAMUSCULAR | Status: DC | PRN
  Administered 2012-05-18: 13:00:00

## 2012-05-18 MED ORDER — MEPERIDINE HCL 50 MG/ML IJ SOLN: 6.2500 mg | INTRAMUSCULAR | Status: DC | PRN

## 2012-05-18 MED ORDER — HYDROCODONE-ACETAMINOPHEN 5-325 MG PO TABS: 1.0000 | ORAL_TABLET | Freq: Four times a day (QID) | ORAL | Status: DC | PRN

## 2012-05-18 MED ORDER — VITAMIN D3 25 MCG (1000 UNIT) PO TABS
1000.0000 [IU] | ORAL_TABLET | Freq: Every day | ORAL | Status: DC
  Administered 2012-05-18 – 2012-05-20 (×3): 1000 [IU] via ORAL
  Filled 2012-05-18 (×3): qty 1

## 2012-05-18 MED ORDER — SODIUM CHLORIDE 0.9 % IV BOLUS (SEPSIS): 1000.0000 mL | Freq: Once | INTRAVENOUS | Status: DC

## 2012-05-18 MED ORDER — LIDOCAINE HCL 4 % MT SOLN
OROMUCOSAL | Status: DC | PRN
  Administered 2012-05-18: 4 mL via TOPICAL

## 2012-05-18 MED ORDER — CEFAZOLIN SODIUM-DEXTROSE 2-3 GM-% IV SOLR
INTRAVENOUS | Status: DC | PRN
  Administered 2012-05-18: 2 g via INTRAVENOUS

## 2012-05-18 MED ORDER — ASPIRIN EC 81 MG PO TBEC
81.0000 mg | DELAYED_RELEASE_TABLET | Freq: Every day | ORAL | Status: DC
  Administered 2012-05-18 – 2012-05-19 (×2): 81 mg via ORAL
  Filled 2012-05-18 (×3): qty 1

## 2012-05-18 MED ORDER — LACTATED RINGERS IV SOLN: INTRAVENOUS | Status: DC

## 2012-05-18 MED ORDER — FENTANYL CITRATE 0.05 MG/ML IJ SOLN: INTRAMUSCULAR | Status: DC | PRN

## 2012-05-18 MED ORDER — ATORVASTATIN CALCIUM 20 MG PO TABS
20.0000 mg | ORAL_TABLET | Freq: Every day | ORAL | Status: DC
  Administered 2012-05-19 – 2012-05-20 (×2): 20 mg via ORAL
  Filled 2012-05-18 (×2): qty 1

## 2012-05-18 MED ORDER — SUCCINYLCHOLINE CHLORIDE 20 MG/ML IJ SOLN
INTRAMUSCULAR | Status: DC | PRN
  Administered 2012-05-18: 100 mg via INTRAVENOUS

## 2012-05-18 MED ORDER — BUPIVACAINE LIPOSOME 1.3 % IJ SUSP
20.0000 mL | Freq: Once | INTRAMUSCULAR | Status: DC
  Filled 2012-05-18: qty 20

## 2012-05-18 MED ORDER — HYDROMORPHONE HCL PF 1 MG/ML IJ SOLN
0.5000 mg | INTRAMUSCULAR | Status: DC | PRN
  Administered 2012-05-18 – 2012-05-19 (×5): 1 mg via INTRAVENOUS
  Filled 2012-05-18 (×5): qty 1

## 2012-05-18 MED ORDER — CISATRACURIUM BESYLATE (PF) 10 MG/5ML IV SOLN
INTRAVENOUS | Status: DC | PRN
  Administered 2012-05-18 (×2): 2 mg via INTRAVENOUS
  Administered 2012-05-18: 10 mg via INTRAVENOUS

## 2012-05-18 MED ORDER — ACETAMINOPHEN 10 MG/ML IV SOLN
INTRAVENOUS | Status: AC
  Filled 2012-05-18: qty 100

## 2012-05-18 MED ORDER — ONDANSETRON HCL 4 MG/2ML IJ SOLN
INTRAMUSCULAR | Status: DC | PRN
  Administered 2012-05-18: 4 mg via INTRAVENOUS

## 2012-05-18 MED ORDER — PROMETHAZINE HCL 25 MG/ML IJ SOLN: 6.2500 mg | INTRAMUSCULAR | Status: DC | PRN

## 2012-05-18 MED ORDER — FUROSEMIDE 40 MG PO TABS
40.0000 mg | ORAL_TABLET | Freq: Every day | ORAL | Status: DC
  Administered 2012-05-18 – 2012-05-20 (×3): 40 mg via ORAL
  Filled 2012-05-18 (×3): qty 1

## 2012-05-18 MED ORDER — HYDRALAZINE HCL 50 MG PO TABS
100.0000 mg | ORAL_TABLET | Freq: Two times a day (BID) | ORAL | Status: DC
  Administered 2012-05-18 – 2012-05-20 (×4): 100 mg via ORAL
  Filled 2012-05-18 (×5): qty 2

## 2012-05-18 MED ORDER — BUPIVACAINE-EPINEPHRINE PF 0.25-1:200000 % IJ SOLN
INTRAMUSCULAR | Status: AC
  Filled 2012-05-18: qty 30

## 2012-05-18 MED ORDER — METOPROLOL TARTRATE 25 MG PO TABS
25.0000 mg | ORAL_TABLET | Freq: Two times a day (BID) | ORAL | Status: DC
  Administered 2012-05-18 – 2012-05-20 (×4): 25 mg via ORAL
  Filled 2012-05-18 (×5): qty 1

## 2012-05-18 MED ORDER — GLYCOPYRROLATE 0.2 MG/ML IJ SOLN
INTRAMUSCULAR | Status: DC | PRN
  Administered 2012-05-18: 0.4 mg via INTRAVENOUS

## 2012-05-18 MED ORDER — FENTANYL CITRATE 0.05 MG/ML IJ SOLN
INTRAMUSCULAR | Status: AC
  Filled 2012-05-18: qty 2

## 2012-05-18 MED ORDER — SUFENTANIL CITRATE 50 MCG/ML IV SOLN
INTRAVENOUS | Status: DC | PRN
  Administered 2012-05-18: 5 ug via INTRAVENOUS
  Administered 2012-05-18 (×3): 10 ug via INTRAVENOUS
  Administered 2012-05-18 (×2): 5 ug via INTRAVENOUS
  Administered 2012-05-18: 10 ug via INTRAVENOUS
  Administered 2012-05-18 (×3): 5 ug via INTRAVENOUS

## 2012-05-18 MED ORDER — STERILE WATER FOR IRRIGATION IR SOLN
Status: DC | PRN
  Administered 2012-05-18: 3000 mL

## 2012-05-18 MED ORDER — FENTANYL CITRATE 0.05 MG/ML IJ SOLN
25.0000 ug | INTRAMUSCULAR | Status: DC | PRN
  Administered 2012-05-18: 12.5 ug via INTRAVENOUS

## 2012-05-18 MED ORDER — OXYCODONE-ACETAMINOPHEN 5-325 MG PO TABS
1.0000 | ORAL_TABLET | ORAL | Status: DC | PRN
  Administered 2012-05-18: 1 via ORAL
  Administered 2012-05-19 – 2012-05-20 (×5): 2 via ORAL
  Filled 2012-05-18: qty 2
  Filled 2012-05-18: qty 1
  Filled 2012-05-18 (×4): qty 2

## 2012-05-18 MED ORDER — LACTATED RINGERS IR SOLN
Status: DC | PRN
  Administered 2012-05-18: 200 mL

## 2012-05-18 MED ORDER — AMLODIPINE BESYLATE 10 MG PO TABS
10.0000 mg | ORAL_TABLET | Freq: Every morning | ORAL | Status: DC
  Administered 2012-05-19 – 2012-05-20 (×2): 10 mg via ORAL
  Filled 2012-05-18 (×2): qty 1

## 2012-05-18 MED ORDER — CALCITRIOL 0.5 MCG PO CAPS
0.5000 ug | ORAL_CAPSULE | Freq: Every day | ORAL | Status: DC
  Administered 2012-05-18 – 2012-05-19 (×2): 0.5 ug via ORAL
  Filled 2012-05-18 (×3): qty 1

## 2012-05-18 MED ORDER — HEPARIN SODIUM (PORCINE) 1000 UNIT/ML IJ SOLN
INTRAMUSCULAR | Status: AC
  Filled 2012-05-18: qty 1

## 2012-05-18 MED ORDER — CLONIDINE HCL 0.3 MG PO TABS
0.3000 mg | ORAL_TABLET | Freq: Two times a day (BID) | ORAL | Status: DC
  Administered 2012-05-18 – 2012-05-20 (×4): 0.3 mg via ORAL
  Filled 2012-05-18 (×5): qty 1

## 2012-05-18 MED ORDER — ACETAMINOPHEN 10 MG/ML IV SOLN
1000.0000 mg | Freq: Four times a day (QID) | INTRAVENOUS | Status: AC
  Administered 2012-05-18 – 2012-05-19 (×3): 1000 mg via INTRAVENOUS
  Filled 2012-05-18 (×4): qty 100

## 2012-05-18 MED ORDER — HYDROMORPHONE HCL PF 1 MG/ML IJ SOLN
INTRAMUSCULAR | Status: DC | PRN
  Administered 2012-05-18 (×2): 1 mg via INTRAVENOUS

## 2012-05-18 MED ORDER — SENNA 8.6 MG PO TABS
1.0000 | ORAL_TABLET | Freq: Two times a day (BID) | ORAL | Status: DC
  Administered 2012-05-18 – 2012-05-20 (×4): 8.6 mg via ORAL
  Filled 2012-05-18: qty 2
  Filled 2012-05-18 (×3): qty 1

## 2012-05-18 MED ORDER — LACTATED RINGERS IV SOLN
INTRAVENOUS | Status: DC | PRN
  Administered 2012-05-18: 10:00:00 via INTRAVENOUS

## 2012-05-18 MED ORDER — NEOSTIGMINE METHYLSULFATE 1 MG/ML IJ SOLN
INTRAMUSCULAR | Status: DC | PRN
  Administered 2012-05-18: 3 mg via INTRAVENOUS

## 2012-05-18 MED ORDER — DOCUSATE SODIUM 100 MG PO CAPS
100.0000 mg | ORAL_CAPSULE | Freq: Two times a day (BID) | ORAL | Status: DC
  Administered 2012-05-18 – 2012-05-20 (×4): 100 mg via ORAL
  Filled 2012-05-18 (×5): qty 1

## 2012-05-18 MED ORDER — INDIGOTINDISULFONATE SODIUM 8 MG/ML IJ SOLN
INTRAMUSCULAR | Status: AC
  Filled 2012-05-18: qty 10

## 2012-05-18 SURGICAL SUPPLY — 62 items
ADH SKN CLS APL DERMABOND .7 (GAUZE/BANDAGES/DRESSINGS) ×2
CANISTER SUCTION 2500CC (MISCELLANEOUS) ×3 IMPLANT
CATH FOLEY 2WAY SLVR 18FR 30CC (CATHETERS) ×3 IMPLANT
CATH TIEMANN FOLEY 18FR 5CC (CATHETERS) ×3 IMPLANT
CHLORAPREP W/TINT 26ML (MISCELLANEOUS) ×3 IMPLANT
CLIP LIGATING HEM O LOK PURPLE (MISCELLANEOUS) ×5 IMPLANT
CLIP LIGATING HEMO LOK XL GOLD (MISCELLANEOUS) ×2 IMPLANT
CLOTH BEACON ORANGE TIMEOUT ST (SAFETY) ×3 IMPLANT
CONT SPECI 4OZ STER CLIK (MISCELLANEOUS) ×3 IMPLANT
CORD HIGH FREQUENCY UNIPOLAR (ELECTROSURGICAL) ×3 IMPLANT
COVER SURGICAL LIGHT HANDLE (MISCELLANEOUS) ×3 IMPLANT
COVER TIP SHEARS 8 DVNC (MISCELLANEOUS) ×2 IMPLANT
COVER TIP SHEARS 8MM DA VINCI (MISCELLANEOUS) ×1
CUTTER ECHEON FLEX ENDO 45 340 (ENDOMECHANICALS) ×3 IMPLANT
DECANTER SPIKE VIAL GLASS SM (MISCELLANEOUS) ×2 IMPLANT
DERMABOND ADVANCED (GAUZE/BANDAGES/DRESSINGS) ×1
DERMABOND ADVANCED .7 DNX12 (GAUZE/BANDAGES/DRESSINGS) ×2 IMPLANT
DRAPE SURG IRRIG POUCH 19X23 (DRAPES) ×3 IMPLANT
DRSG TEGADERM 2-3/8X2-3/4 SM (GAUZE/BANDAGES/DRESSINGS) ×8 IMPLANT
DRSG TEGADERM 4X4.75 (GAUZE/BANDAGES/DRESSINGS) ×4 IMPLANT
DRSG TEGADERM 6X8 (GAUZE/BANDAGES/DRESSINGS) ×6 IMPLANT
ELECT REM PT RETURN 9FT ADLT (ELECTROSURGICAL) ×3
ELECTRODE REM PT RTRN 9FT ADLT (ELECTROSURGICAL) ×2 IMPLANT
GAUZE SPONGE 2X2 8PLY STRL LF (GAUZE/BANDAGES/DRESSINGS) ×2 IMPLANT
GLOVE BIO SURGEON STRL SZ 6.5 (GLOVE) ×3 IMPLANT
GLOVE BIOGEL M STRL SZ7.5 (GLOVE) ×8 IMPLANT
GOWN STRL NON-REIN LRG LVL3 (GOWN DISPOSABLE) ×6 IMPLANT
GOWN STRL REIN XL XLG (GOWN DISPOSABLE) ×6 IMPLANT
HEMOSTAT SURGICEL 4X8 (HEMOSTASIS) ×1 IMPLANT
HOLDER FOLEY CATH W/STRAP (MISCELLANEOUS) ×3 IMPLANT
IV LACTATED RINGERS 1000ML (IV SOLUTION) ×2 IMPLANT
KIT ACCESSORY DA VINCI DISP (KITS) ×1
KIT ACCESSORY DVNC DISP (KITS) ×2 IMPLANT
KIT PROCEDURE DA VINCI SI (MISCELLANEOUS) ×1
KIT PROCEDURE DVNC SI (MISCELLANEOUS) ×2 IMPLANT
NDL INSUFFLATION 14GA 120MM (NEEDLE) ×2 IMPLANT
NDL SAFETY ECLIPSE 18X1.5 (NEEDLE) IMPLANT
NEEDLE HYPO 18GX1.5 SHARP (NEEDLE) ×3
NEEDLE HYPO 22GX1.5 SAFETY (NEEDLE) ×1 IMPLANT
NEEDLE INSUFFLATION 14GA 120MM (NEEDLE) ×3 IMPLANT
NEEDLE SPNL 22GX9.0 ACCUTG (NEEDLE) ×2 IMPLANT
PACK ROBOT UROLOGY CUSTOM (CUSTOM PROCEDURE TRAY) ×3 IMPLANT
RELOAD GREEN ECHELON 45 (STAPLE) ×3 IMPLANT
SCISSORS LAP 5X45 EPIX DISP (ENDOMECHANICALS) ×1 IMPLANT
SET TUBE IRRIG SUCTION NO TIP (IRRIGATION / IRRIGATOR) ×3 IMPLANT
SOLUTION ELECTROLUBE (MISCELLANEOUS) ×3 IMPLANT
SPONGE GAUZE 2X2 STER 10/PKG (GAUZE/BANDAGES/DRESSINGS)
SPONGE LAP 4X18 X RAY DECT (DISPOSABLE) ×3 IMPLANT
SUT ETHILON 3 0 PS 1 (SUTURE) ×3 IMPLANT
SUT MNCRL AB 4-0 PS2 18 (SUTURE) ×6 IMPLANT
SUT PDS AB 1 CT1 27 (SUTURE) ×6 IMPLANT
SUT VIC AB 3-0 SH 27 (SUTURE) ×12
SUT VIC AB 3-0 SH 27X BRD (SUTURE) IMPLANT
SUT VICRYL 0 UR6 27IN ABS (SUTURE) ×3 IMPLANT
SUT VLOC BARB 180 ABS3/0GR12 (SUTURE) ×9
SUTURE VLOC BRB 180 ABS3/0GR12 (SUTURE) IMPLANT
SYR 27GX1/2 1ML LL SAFETY (SYRINGE) ×3 IMPLANT
SYR 5ML LL (SYRINGE) ×2 IMPLANT
TOWEL OR NON WOVEN STRL DISP B (DISPOSABLE) ×3 IMPLANT
TROCAR 12M 150ML BLUNT (TROCAR) ×3 IMPLANT
TROCAR XCEL 12X100 BLDLESS (ENDOMECHANICALS) ×1 IMPLANT
WATER STERILE IRR 1500ML POUR (IV SOLUTION) ×4 IMPLANT

## 2012-05-18 NOTE — Progress Notes (Signed)
Patient still C/O pain, PO pain med given, stated he is still in pain to walk, will report off to night shift nurse to F/U. JP drain site still draining, dressing saturated with bloody output, dressing changed and Dr. Berneice Heinrich informed. Will continue to assess patient.

## 2012-05-18 NOTE — Progress Notes (Signed)
PAtient transferred from PACU, alert and oriented, C/O abdominal and back pain, PRN IV dilaudid given. Reviewed plain of care with patient. Surgical incision on the right side of the abd intact, no drainage, JP site on the left side draining alote dressing saturated with blood, reinforced dressing; will continue to monitor patient.

## 2012-05-18 NOTE — Anesthesia Preprocedure Evaluation (Addendum)
Anesthesia Evaluation  Patient identified by MRN, date of birth, ID band Patient awake    Reviewed: Allergy & Precautions, H&P , NPO status , Patient's Chart, lab work & pertinent test results  Airway Mallampati: II TM Distance: >3 FB Neck ROM: Full    Dental no notable dental hx.    Pulmonary Current Smoker,  breath sounds clear to auscultation  Pulmonary exam normal       Cardiovascular hypertension, Pt. on medications Rhythm:Regular Rate:Normal     Neuro/Psych negative neurological ROS  negative psych ROS   GI/Hepatic negative GI ROS, Neg liver ROS,   Endo/Other  diabetes, Type 2  Renal/GU CRFRenal disease  negative genitourinary   Musculoskeletal negative musculoskeletal ROS (+)   Abdominal   Peds negative pediatric ROS (+)  Hematology negative hematology ROS (+)   Anesthesia Other Findings   Reproductive/Obstetrics negative OB ROS                         Anesthesia Physical Anesthesia Plan  ASA: III  Anesthesia Plan: General   Post-op Pain Management:    Induction: Intravenous  Airway Management Planned: Oral ETT  Additional Equipment:   Intra-op Plan:   Post-operative Plan: Extubation in OR  Informed Consent: I have reviewed the patients History and Physical, chart, labs and discussed the procedure including the risks, benefits and alternatives for the proposed anesthesia with the patient or authorized representative who has indicated his/her understanding and acceptance.   Dental advisory given  Plan Discussed with: CRNA  Anesthesia Plan Comments:         Anesthesia Quick Evaluation

## 2012-05-18 NOTE — Anesthesia Postprocedure Evaluation (Signed)
  Anesthesia Post-op Note  Patient: [name] [name]  Procedure(s) Performed: Procedure(s) (LRB): ROBOTIC ASSISTED LAPAROSCOPIC RADICAL PROSTATECTOMY (N/A) LYMPHADENECTOMY (Bilateral)  Patient Location: PACU  Anesthesia Type: General  Level of Consciousness: awake and alert   Airway and Oxygen Therapy: Patient Spontanous Breathing  Post-op Pain: mild  Post-op Assessment: Post-op Vital signs reviewed, Patient's Cardiovascular Status Stable, Respiratory Function Stable, Patent Airway and No signs of Nausea or vomiting  Last Vitals:  Filed Vitals:   05/18/12 1500  BP: 169/72  Pulse: 71  Temp:   Resp: 13    Post-op Vital Signs: stable   Complications: No apparent anesthesia complications

## 2012-05-18 NOTE — Brief Op Note (Signed)
05/18/2012  2:02 PM  PATIENT:  [name] [name]  67 y.o. male  PRE-OPERATIVE DIAGNOSIS:  PROSTATE CANCER  POST-OPERATIVE DIAGNOSIS:  prostate cancer  PROCEDURE:  Procedure(s): ROBOTIC ASSISTED LAPAROSCOPIC RADICAL PROSTATECTOMY (N/A) LYMPHADENECTOMY (Bilateral)  SURGEON:  Surgeon(s) and Role:    * Sebastian Ache, MD - Primary  PHYSICIAN ASSISTANT:   ASSISTANTS: Lujean Rave, PA   ANESTHESIA:   local and general  EBL:  Total I/O In: 100 [I.V.:100] Out: 375 [Urine:75; Blood:300]  BLOOD ADMINISTERED:none  DRAINS: 1 - JP to bulb suction, 2 - Foley to straight drain   LOCAL MEDICATIONS USED:  MARCAINE     SPECIMEN:  Source of Specimen:  1 - pre prostatic fat. 2 - Rt pelvic lymph nodes. 3 - Lt pelvic lymph nodes. 4 - radical prostatectomy  DISPOSITION OF SPECIMEN:  PATHOLOGY  COUNTS:  YES  TOURNIQUET:  * No tourniquets in log *  DICTATION: .Other Dictation: Dictation Number [id number]  PLAN OF CARE: Admit to inpatient   PATIENT DISPOSITION:  PACU - hemodynamically stable.   Delay start of Pharmacological VTE agent (>24hrs) due to surgical blood loss or risk of bleeding: yes

## 2012-05-18 NOTE — H&P (Signed)
[name] [name] is an 67 y.o. male.    Chief Complaint: Pre-Op Robotic Prostatectomy  HPI:   1 - Prostate Cancer - Gleason 3+3=6 in 5/12 cores up to 50% including bilateral gland, base, apex + 4 cores HGPIN on prostate biopsy 01/2012. PSA 7.1. TRUS volume 47cc wtih mild intravesciale component of lateral lobes. [facility] predicts only 5% indolent.  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  3 - Chronic Kidney Disease - Baseline Cr 3-4 range since 2011. Renal US 2009 w/o hydro and c/w medical renal disease. Follows with Dr. Lacy Duverney at [facility].  PMH sig for DM2, HTN, AV Fistula (not used). No CV disease. No blood thinners.  Today [name] [name] is seen to proceed with robotic prostatectomy. No interval fevers.  Past Medical History  Diagnosis Date  . Diabetes mellitus   . Hyperlipidemia   . Hypertension   . Chronic renal insufficiency   . Joint pain   . Gout   . GERD (gastroesophageal reflux disease)   . Cancer     prostate cancer  . Arthritis     Past Surgical History  Procedure Laterality Date  . Appendectomy  66 yrs old    open  . Av fistula placement, brachiocephalic  2012    Family History  Problem Relation Age of Onset  . Diabetes Brother   . Kidney disease Brother    Social History:  reports that he has been smoking Cigarettes.  He has a 40 pack-year smoking history. He has never used smokeless tobacco. He reports that he does not drink alcohol or use illicit drugs.  Allergies: No Known Allergies  No prescriptions prior to admission    No results found for this or any previous visit (from the past 48 hour(s)). No results found.  Review of Systems  Constitutional: Negative.  Negative for fever, chills, weight loss and malaise/fatigue.  HENT: Negative.   Eyes: Negative.   Respiratory: Negative.   Cardiovascular: Negative.   Gastrointestinal: Negative.    Genitourinary: Negative.  Negative for dysuria, urgency, frequency, hematuria and flank pain.  Musculoskeletal: Negative.   Skin: Negative.   Endo/Heme/Allergies: Negative.   Psychiatric/Behavioral: Negative.     There were no vitals taken for this visit. Physical Exam  Constitutional: He is oriented to person, place, and time. He appears well-developed and well-nourished.  HENT:  Head: Normocephalic and atraumatic.  Eyes: EOM are normal. Pupils are equal, round, and reactive to light.  Neck: Normal range of motion. Neck supple.  Cardiovascular: Normal rate and regular rhythm.   Respiratory: Effort normal.  GI: Soft. Bowel sounds are normal.  Genitourinary: Penis normal.  Musculoskeletal: Normal range of motion.  Neurological: He is alert and oriented to person, place, and time.  Skin: Skin is warm and dry.  Psychiatric: He has a normal mood and affect. His behavior is normal. Judgment and thought content normal.     Assessment/Plan  1 - Prostate Cancer - We re-discussed prostatectomy and specifically robotic prostatectomy with bilateral pelvic lymphadenectomy being the technique that I most commonly perform. I showed the patient on their abdomen the approximately 6 small incision (trocar) sites as well as presumed extraction sites with robotic approach as well as possible open incision sites should open conversion be necessary. We discussed peri-operative risks including bleeding, infection, deep vein thrombosis, pulmonary embolism, compartment syndrome, nuropathy / neuropraxia, heart attack, stroke, death, as well as long-term  risks such as non-cure / need for additional therapy. We specifically addressed that the procedure would compromise urinary control leading to stress incontinence which typically resolves with time and pelvic rehabilitation (Kegel's, etc..), but can sometimes be permanent and require additional therapy including surgery. We also specifically addressed sexual  sequellae including significant erectile dysfunction which typically partially resolves with time but can also be permanent and require additional therapy including surgery.   We re- discussed the typical hospital course including usual 1-2 night hospitalization, discharge with foley catheter in place usually for 1-2 weeks before voiding trial as well as usually 2 week recovery until able to perform most non-strenuous activity and 6 weeks until able to return to most jobs and more strenuous activity such as exercise.   Pt wishes to proceed today as scheduled.  2 - Lower Urinary Tract Symptoms - Contiue current regimen. We again discussed the expected post-prostatectomy stress incontinence that typically resolves with time and adherence to Kegel-protocol.  3 - Chronic Kidney Disease - Prior w/u without hydro or significane obstructive component. Will monitor GFR / lytes / UOP in house.  Andria Head 05/18/2012, 6:23 AM

## 2012-05-18 NOTE — Transfer of Care (Signed)
Immediate Anesthesia Transfer of Care Note  Patient: [name] [name]  Procedure(s) Performed: Procedure(s): ROBOTIC ASSISTED LAPAROSCOPIC RADICAL PROSTATECTOMY (N/A) LYMPHADENECTOMY (Bilateral)  Patient Location: PACU  Anesthesia Type:General  Level of Consciousness: awake, alert , oriented and patient cooperative  Airway & Oxygen Therapy: Patient Spontanous Breathing and Patient connected to face mask oxygen  Post-op Assessment: Report given to PACU RN and Post -op Vital signs reviewed and stable  Post vital signs: stable  Complications: No apparent anesthesia complications

## 2012-05-19 LAB — BASIC METABOLIC PANEL
BUN: 69 mg/dL — ABNORMAL HIGH (ref 6–23)
CO2: 16 mEq/L — ABNORMAL LOW (ref 19–32)
Chloride: 108 mEq/L (ref 96–112)
Creatinine, Ser: 6.19 mg/dL — ABNORMAL HIGH (ref 0.50–1.35)
Glucose, Bld: 111 mg/dL — ABNORMAL HIGH (ref 70–99)

## 2012-05-19 LAB — CREATININE, FLUID (PLEURAL, PERITONEAL, JP DRAINAGE): Creat, Fluid: 35.8 mg/dL

## 2012-05-19 LAB — HEMOGLOBIN AND HEMATOCRIT, BLOOD: HCT: 30.5 % — ABNORMAL LOW (ref 39.0–52.0)

## 2012-05-19 NOTE — Progress Notes (Signed)
Pt saturated two abd bads and drakin sponges during the night from JP site. MD aware. Day nurse communicated copious amounts of drainage to the MD. Got 180 ml from JP drain for 12 hour shift.

## 2012-05-19 NOTE — Progress Notes (Signed)
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 PSA 7.1. Cr 5.2 immediately pre/post-op.  2 -  Chronic Kidney Disease - Baseline Cr 3-4 range since 2011. Renal US 2009 w/o hydro and c/w medical renal disease. Follows with Dr. Lacy Duverney at [facility].   Today [name] [name] is POD 1 from prostatectomy. He is ambulatory x 1 last night and keeping things down without emesis.   Objective: Vital signs in last 24 hours: Temp:  [97.9 F (36.6 C)-99.7 F (37.6 C)] 99.2 F (37.3 C) (05/10 0700) Pulse Rate:  [68-96] 93 (05/10 0700) Resp:  [11-20] 20 (05/10 0700) BP: (140-169)/(60-88) 157/76 mmHg (05/10 0700) SpO2:  [95 %-100 %] 97 % (05/10 0700) Weight:  [79.83 kg (175 lb 15.9 oz)] 79.83 kg (175 lb 15.9 oz) (05/09 1700) Last BM Date: 05/17/12  Intake/Output from previous day: 05/09 0701 - 05/10 0700 In: 2084.2 [P.O.:120; I.V.:1764.2; IV Piggyback:200] Out: 1205 [Urine:650; Drains:255; Blood:300] Intake/Output this shift: Total I/O In: -  Out: 300 [Urine:300]  General appearance: alert, cooperative and appears stated age Head: Normocephalic, without obvious abnormality, atraumatic Eyes: conjunctivae/corneas clear. PERRL, EOM's intact. Fundi benign. Ears: normal TM's and external ear canals both ears Nose: Nares normal. Septum midline. Mucosa normal. No drainage or sinus tenderness. Throat: lips, mucosa, and tongue normal; teeth and gums normal Neck: no adenopathy, no carotid bruit, no JVD, supple, symmetrical, trachea midline and thyroid not enlarged, symmetric, no tenderness/mass/nodules Back: symmetric, no curvature. ROM normal. No CVA tenderness. Resp: clear to auscultation bilaterally Chest wall: no tenderness Cardio: regular rate and rhythm, S1, S2 normal, no murmur, click, rub or gallop GI: soft, non-tender;  bowel sounds normal; no masses,  no organomegaly Male genitalia: normal, Foley c/d/i wtih amber-colored urine Extremities: extremities normal, atraumatic, no cyanosis or edema Pulses: 2+ and symmetric Skin: Skin color, texture, turgor normal. No rashes or lesions Lymph nodes: Cervical, supraclavicular, and axillary nodes normal. Neurologic: Grossly normal Incision/Wound: Port sites / extraction site c/d/i. JP with serosanguinous efflux, clearing.  Lab Results:   Recent Labs  05/18/12 1424 05/19/12 0527  HGB 12.0* 10.4*  HCT 35.8* 30.5*   BMET  Recent Labs  05/18/12 1424 05/19/12 0527  NA 138 139  K 3.8 4.9  CL 107 108  CO2 19 16*  GLUCOSE 175* 111*  BUN 63* 69*  CREATININE 5.27* 6.19*  CALCIUM 8.9 9.1   PT/INR No results found for this basename: LABPROT, INR,  in the last 72 hours ABG No results found for this basename: PHART, PCO2, PO2, HCO3,  in the last 72 hours  Studies/Results: No results found.  Anti-infectives: Anti-infectives   Start     Dose/Rate Route Frequency Ordered Stop   05/18/12 0846  ceFAZolin (ANCEF) IVPB 2 g/50 mL premix  Status:  Discontinued     2 g 100 mL/hr over 30 Minutes Intravenous 30 min pre-op 05/18/12 0846 05/18/12 1543   05/18/12 0000  ciprofloxacin (CIPRO) 500 MG tablet     500 mg Oral 2 times daily 05/18/12 1409        Assessment/Plan:  1 - Prostate Cancer - s/p robotic prostatectomy, path pending. Check JP Cr today.   2 -  Chronic Kidney Disease - Cr rising some, but K and lytes acceptable as is UOP. Continue judicious fluids and renal diet and monitor BMP  while in house.  3 - Dispo - Ambulate, remain in house, likely DC tomorrow.  [facility], Katesha Eichel 05/19/2012

## 2012-05-19 NOTE — Op Note (Signed)
NAME:  [name] [name], [name] [name] NO.:  [id number]  MEDICAL RECORD NO.:  [id number]  LOCATION:  1445                         FACILITY:  [facility]  PHYSICIAN:  Sebastian Ache, MD     DATE OF BIRTH:  09-Jan-1946  DATE OF PROCEDURE:  05/18/2012 DATE OF DISCHARGE:                              OPERATIVE REPORT   DIAGNOSIS:  Low risk prostate cancer.  PROCEDURE: 1. Robotic assisted laparoscopic prostatectomy. 2. Bilateral pelvic lymphadenectomy. 3. Robotic adhesiolysis.  ASSISTANT:  Pecola Leisure, PA.  FINDINGS:  Significant adhesions between the cecum and the anterior abdominal wall as well as the sigmoid in the left pelvis.  DRAINS: 1. JP to bulb suction. 2. Foley catheter to straight drain.  ESTIMATED BLOOD LOSS:  300 mL.  SPECIMEN: 1. Periprostatic fat. 2. Right pelvic lymph nodes. 3. Left pelvic lymph nodes. 4. Radical prostatectomy.  INDICATION:  [name] [name] is a pleasant 67 year old gentleman with history of end-stage renal disease predialysis who was found of evaluation of elevated PSA to have large volume, low risk prostate cancer.  Options were discussed including surveillance versus surgery versus ablative therapies and the patient wished to proceed with surgical extirpation with robotic assistance.  Informed consent was obtained and placed in medical record.  PROCEDURE IN DETAIL:  The patient being, [name] [name], was verified. Procedure was carried out.  Time-out was performed.  Intravenous antibiotics were administered.  General endotracheal anesthesia was reduced.  The patient was placed in a low lithotomy position.  Sterile field was created by prepping and draping the patient's penis, perineum, and proximal thighs using iodine x3.  The infraxiphoid abdomen was prepped using chlorhexidine gluconate x3.  Foley catheter was placed to straight drain.  Arms were tucked.  Sequential compression devices were applied. A test of steep T-berg positioning was  performed and he was found to be in suiteably position.  Next, a high-flow low pressure pneumoperitoneum was obtained using Veress technique in the supraumbilical midline as to  avoid an area of previous right lower quadrant surgery.  Next, a 12-mm robotic camera port was placed in the same location.  Laparoscopic examination of peritoneal cavity revealed no sign of visceral injury.  There were several areas of adhesions including the area of the cecum to the anterior abdominal wall, as well as sigmoid and the left pelvis that would need adhesiolysis.  Additional ports were then carefully placed as follows.  Under direct vision, a right paramedian 8-mm robotic port, right far lateral 12-mm assistant port, right paramedian 5-mm assistant port, left paramedian 8-mm robotic port, left far lateral 8-mm robotic port.  Robot was docked and passed through its electronic checks. Attention was then directed at adhesiolysis. Then, adhesions were taken down from the area of the cecum and the anterior abdominal wall and then allowed better access to the area of the right iliac fossa for later lymphadenectomy similarly and adhesions were taken down in the area of some redundant-appearing sigmoid and the left pelvis allowing better access to the left iliac fossa for later lymphadenectomy.  Great care was taken to avoid injury to the bowel, this did not occur.  Attention was directed at developing the space of Retzius.  Incision made lateral to the left medial umbilical ligament from midline towards the area of the iliac vessels and following their curvature.  The bladder was swept away from the pelvic sidewall towards the area of the endopelvic fascia.  A mirror-image dissection was performed on the right side. Intervening medial attachments were taken down using cautery scissors to expose the anterior base of the prostate.  There was a significant amount of periprostatic fat.  This was carefully  released and set aside for permanent pathology.  First on the left side, the endopelvic fascia was carefully spread away from the lateral aspect of the prostate. A mirror-image dissection was performed on the right side.  This revealed the area of the dorsal venous complex.  This was controlled using endovascular load stapler, which resulted in excellent hemostatic control and no injury to the membranous urethra.  Attention was directed to the bladder dissection.  The area of bladder neck was identified by removing the Foley catheter back and forth.  Dissection proceeded in anterior- posterior location.  This separated the bladder neck from the base of the prostate.  There was a significant intravesical component to the patient's prostate.  Great care was taken to avoid injury to the ureteral orifices and this did not occur.  Posterior dissection was performed by incising approximately 11 mm posterior-inferior lip of the bladder neck incising posteriorly, dissecting posteriorly.  Bilateral seminal vessels were encountered, dissected for distance of 4 cm placed on gentle superior traction.  Bilateral seminal vesicles were carefully dissected towards their tips and also placed on gentle lateral traction. Dissection was then proceeded in the base to apex orientation In the plane of Denonvilliers, sweeping perirectal fascia and fibrofatty tissue away from the posterior area of the prostate.  Great care was taken to avoid rectal injury which did not occur.  This exposed the neurovascular pedicles on both sides, vascular control was performed using a sequential clipping technique first on the left side and the right side at the base of the apex orientation towards the area of the apex of the prostate.  Neuromuscular tissue corresponding to the area of the presumed nerve plexus was carefully swept away from the prostate. Membranous urethra was then controlled using cold scissors taking great  care to leave adequate membranous urethral stump.  This completely freed the prostate.  The specimen was placed to an EndoCatch bag for later retrieval.  Attention was then directed at pelvic lymphadenectomy. Given the patient's low risk disease, a limited template was used, first on the right side, all fiber fatty tissue in the confines of the external iliac vein, obturator nerve, and pelvic sidewall were carefully mobilized.  Lympohstasis was achieved using cold clips, and set aside for permanent pathology, labeled as right pelvic lymph nodes. A mirror- image, lymphadenectomy was performed on the left side.  The obturator nerves were inspected following these maneuvers and found to be completely uninjured.  Attention was then directed at bladder neck reconstruction. Given the prominent intravesical component of the prostate, the caliber of the bladder neck required reconstruction.  Figure-of-eight using a 3- 0 Vicryl was placed on each side, which better approximated the diameter of the membranous urethra and bladder neck.  This also allowed complete visualization of the ureteral orifices which were also inherintly repositioned away from the area of anastomosis using the figure-of-eight sutures by pushing them laterally.  Next, posterior reconstruction was performed using 3-0 V-Loc suture reapproximating the posterior bladder Neck to the posterior urethral plate which brought  the urethra and bladder neck in a tension-free apposition.  Mucosa-to-mucosa anastomosis was performed using double-armed V-Loc suture from 6 o'clock to 12 o'clock position, taking great care to avoid including the Foley catheter.  This did not occur.  Anterior reconstruction was performed by anchoring the same anastomotic stitch over the previous puboprostatic ligaments.  A new Foley catheter was placed which irrigated quantitatively without leak.  There was mild venous oozing in the area of the bladder neck on the  right side.  2 pieces of Surgicel and the figure-of-eight 3- 0 suture were placed in this location which resulted in excellent hemostasis.  A closed suction drain was brought through the previous left lateral robotic port.  Robot was undocked.  Specimen was retrieved by extending the previous camera port site for total distance of approximately 4 cm and removing the prostate specimen in its entirety.  Two retrieval sites were closed at the level of the fascia using figure-of-eight PDS x3.  The previous 12-mm assistant port site was closed at the level of the fascia using a Carter-Thomason suture passer under direct vision.  All skin sites were reapproximated using subcuticular Monocryl followed by Dermabond.  A drain stitch was applied.  Procedure was terminated.  The patient tolerated the procedure well.  There were no immediate periprocedural complications.  The patient was taken to postanesthesia care unit in stable condition.          ______________________________ Sebastian Ache, MD     TM/MEDQ  D:  05/18/2012  T:  05/19/2012  Job:  [id number]

## 2012-05-20 LAB — BASIC METABOLIC PANEL
Chloride: 105 mEq/L (ref 96–112)
Creatinine, Ser: 7.02 mg/dL — ABNORMAL HIGH (ref 0.50–1.35)
GFR calc Af Amer: 9 mL/min — ABNORMAL LOW (ref >90)
Sodium: 138 mEq/L (ref 135–145)

## 2012-05-20 MED ORDER — SENNA-DOCUSATE SODIUM 8.6-50 MG PO TABS: 1.0000 | ORAL_TABLET | Freq: Two times a day (BID) | ORAL | Status: DC

## 2012-05-20 MED ORDER — HYDROCODONE-ACETAMINOPHEN 5-325 MG PO TABS: 1.0000 | ORAL_TABLET | Freq: Four times a day (QID) | ORAL | Status: DC | PRN

## 2012-05-20 MED ORDER — CEPHALEXIN 250 MG PO CAPS: 250.0000 mg | ORAL_CAPSULE | Freq: Every day | ORAL | Status: DC

## 2012-05-20 NOTE — Progress Notes (Signed)
At 1700 pt sister was provided list of private sitters that could come sit with patient at home to help provide assistance. Pt sister stated that she was unable to get in touch with any of the companies and wanted patient to stay another night. Eduction provided that pt is medically clear and has been discharged and that he would not qualify for home health services. Paged MD regarding family concerns. Pt sister then requested to speak with "social worker" regarding home health agencies that can come work with patient. Case manager on call was paged regarding family request. Family and patient wished to leave hospital prior to case manager returning phone call. Dr. Kathrynn Running aware. Jenna Routzahn A

## 2012-05-20 NOTE — Progress Notes (Signed)
Patient discharged home, discharge instructions given and explain to patient and he verbalized understanding, Surgical incision intact except for the JP drain site, draining pinkish drainage, dressing applied and demonstrated to patient about care at home and extra supplies for home also given, no other wound noted. Accompanied home by sister, transported to the car by staff via wheelchair.

## 2012-05-20 NOTE — Progress Notes (Signed)
Patient discharged home but wanted to wait for sister before I go over the discharge education/teaching because she is trying to find a aid to assist him at home. Waiting for patient sister, home supplies in room. JP drain and foley care demonstrated to patient. Dressing, leg bag and foley drainage bag given to patient. Patient verbalized understanding.

## 2012-05-20 NOTE — Progress Notes (Signed)
Patient sister here but stated she is still waiting  To get in tough with the sitter. Patient stated he can manage the foley and JP at home after the education.

## 2012-05-20 NOTE — Discharge Summary (Signed)
Physician Discharge Summary  Patient ID: [name] [name] MRN: [id number] DOB/AGE: [birth year] 67 y.o.  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 PSA 7.1. Pt with urine leak with JP Cr of 35 on POD 1 and JP output approx 1/3 of total output. JP drain to remain. This likely contributes to rise in Cr from some systemic reabsorption. Pt instructed on how to document daily totals. By POD 2, the day of discharge, pt ambulatory, tolerating diet, pain controlled, and felt to be adequate for discharge.  2 - Chronic Kidney Disease - Baseline Cr 3-4 range since 2011. Renal US 2009 w/o hydro and c/w medical renal disease. Follows with Dr. Lacy Duverney at [facility]. Cr 5.2 immed post-op and trend up slightly to 7.0 at discharge, K normal at <5 and UOP normal at approx 1500cc per day withotu volume overload.   Consults: None  Significant Diagnostic Studies: labs: Serum Cr 7.0, K 4.8, Path - pending, JP Cr 36. Path pending at discharge.  Treatments: surgery:  robotic prostatectomy on 5/9   Discharge Exam: Blood pressure 137/57, pulse 78, temperature 98.8 F (37.1 C), temperature source Oral, resp. rate 18, height 5\' 7"  (1.702 m), weight 79.83 kg (175 lb 15.9 oz), SpO2 100.00%. General appearance: alert, cooperative and appears stated age Head: Normocephalic, without obvious abnormality, atraumatic Eyes: conjunctivae/corneas clear. PERRL, EOM's intact. Fundi benign. Ears: normal TM's and external ear canals both ears Nose: Nares normal. Septum midline. Mucosa normal. No drainage or sinus tenderness. Throat: lips, mucosa, and tongue  normal; teeth and gums normal Neck: no adenopathy, no carotid bruit, no JVD, supple, symmetrical, trachea midline and thyroid not enlarged, symmetric, no tenderness/mass/nodules Back: symmetric, no curvature. ROM normal. No CVA tenderness. Resp: clear to auscultation bilaterally Chest wall: no tenderness Cardio: regular rate and rhythm, S1, S2 normal, no murmur, click, rub or gallop GI: soft, non-tender; bowel sounds normal; no masses,  no organomegaly Male genitalia: normal, Foley c/d/i with clear yellow urine. Extremities: extremities normal, atraumatic, no cyanosis or edema and LUE AVF with palpable thrill Pulses: 2+ and symmetric Skin: Skin color, texture, turgor normal. No rashes or lesions Lymph nodes: Cervical, supraclavicular, and axillary nodes normal. Neurologic: Grossly normal Incision/Wound: JP site with serosanguinous thin fluid in bulb. All incision sites c/d/i. No hernias.  Disposition: 01-Home or Self Care     Medication List    STOP taking these medications       aspirin EC 81 MG tablet     b complex-vitamin c-folic acid 0.8 MG Tabs     calcitRIOL 0.5 MCG capsule  Commonly known as:  ROCALTROL     cholecalciferol 1000 UNITS tablet  Commonly known as:  VITAMIN D     tamsulosin 0.4 MG Caps  Commonly known as:  FLOMAX      TAKE these medications       amLODipine 10 MG tablet  Commonly known as:  NORVASC  Take 10 mg by mouth every morning.     atorvastatin 20 MG tablet  Commonly known as:  LIPITOR  Take 20 mg by mouth every morning.     cephALEXin 250 MG capsule  Commonly known as:  KEFLEX  Take 1 capsule (250 mg total) by mouth daily. While catheter in place to prevent infection     cloNIDine 0.3 MG tablet  Commonly known as:  CATAPRES  Take 0.3 mg by mouth 2 (two) times daily.     diclofenac sodium 1 % Gel  Commonly known as:  VOLTAREN  Apply 4 g topically 4 (four) times daily. On feet for gout     furosemide 40 MG tablet  Commonly known as:   LASIX  Take 40 mg by mouth daily.     hydrALAZINE 100 MG tablet  Commonly known as:  APRESOLINE  Take 100 mg by mouth 2 (two) times daily.     HYDROcodone-acetaminophen 5-325 MG per tablet  Commonly known as:  NORCO  Take 1-2 tablets by mouth every 6 (six) hours as needed for pain.     metoprolol tartrate 25 MG tablet  Commonly known as:  LOPRESSOR  Take 25 mg by mouth 2 (two) times daily.     sennosides-docusate sodium 8.6-50 MG tablet  Commonly known as:  SENOKOT-S  Take 1 tablet by mouth 2 (two) times daily. While taking pain meds to prevent constipation.     sodium chloride 0.65 % nasal spray  Commonly known as:  OCEAN  Place 1 spray into the nose as needed (for nose bleed).           Follow-up Information   Follow up with Sebastian Ache, MD On 05/28/2012. (at 9:30)    Contact information:   [address] [place] [address] [phone]       Signed: Sebastian Ache 05/20/2012, 7:40 AM

## 2012-05-21 ENCOUNTER — Encounter (HOSPITAL_COMMUNITY): Payer: Self-pay | Admitting: Urology

## 2012-08-28 ENCOUNTER — Encounter (HOSPITAL_COMMUNITY)
Admission: RE | Admit: 2012-08-28 | Discharge: 2012-08-28 | Disposition: A | Discharge status: Home / Self Care | Payer: Medicare Other | Source: Ambulatory Visit | Attending: Nephrology | Admitting: Nephrology

## 2012-08-28 DIAGNOSIS — D509 Iron deficiency anemia, unspecified: Secondary | ICD-10-CM | POA: Insufficient documentation

## 2012-08-28 MED ORDER — SODIUM CHLORIDE 0.9 % IV SOLN
1020.0000 mg | Freq: Once | INTRAVENOUS | Status: AC
  Administered 2012-08-28: 1020 mg via INTRAVENOUS
  Filled 2012-08-28: qty 34

## 2012-09-02 ENCOUNTER — Encounter: Payer: Self-pay | Admitting: Radiation Oncology

## 2012-09-02 DIAGNOSIS — C61 Malignant neoplasm of prostate: Secondary | ICD-10-CM | POA: Insufficient documentation

## 2012-09-02 NOTE — Progress Notes (Signed)
Radiation Oncology         (336) [phone] ________________________________  Initial outpatient Consultation  Name: [name] [name] MRN: [id number]  Date: 09/03/2012  DOB: 07/05/1945  NW:[id number],MVHQIO, MD  Sebastian Ache, MD   REFERRING PHYSICIAN: Sebastian Ache, MD  DIAGNOSIS: 67 y.o. 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 gentleman.  He was noted to have an elevated PSA of 7.1 by his primary care physician, Dr. Julio Sicks in 11/13, representing an increase from 3.96 in 2009.  Accordingly, he was referred for evaluation in urology by Dr. Berneice Heinrich on 01/17/12,  digital rectal examination was performed at that time revealing a 30 gm prostate with no nodules.  The patient proceeded to transrectal ultrasound with 12 biopsies of the prostate on 02/07/12.  The prostate volume measured 46.84 cc.  Out of 12 core biopsies, 5 were positive.  The maximum Gleason score was 3+3.    The patient elected to proceed with robotic-assisted laparoscopic radical prostatectomy on 05/18/2012. At the time of surgery, patient was found to have adenocarcinoma involving approximately 35% of the gland with a Gleason's score of 4+3 involving both prostate lobes. Adenocarcinoma was present at the left apical margin and right mid anterior margin. Lymphovascular invasion was identified. The seminal vesicles were not involved. Extracapsular extension was not definitively identified.  Postoperatively, the patient was counseled regarding his high risk features and his urologist outlined the potential role for adjuvant versus salvage radiotherapy after 3-6 months of recovery to optimize urinary continence.  Since surgery, the patient has regained good urinary control. His PSA was 0.02 in June of 2014 and increased to 0.05 in July of 2014.  The  patient understands the potential role for radiotherapy following prostatectomy in the setting of adverse pathology findings and not 0 rising PSA. Accordingly, he has kindly been referred today for discussion of potential radiation treatment options.  PREVIOUS RADIATION THERAPY: No  PAST MEDICAL HISTORY:  has a past medical history of Diabetes mellitus; Hyperlipidemia; Hypertension; Chronic renal insufficiency; Joint pain; Gout; GERD (gastroesophageal reflux disease); Arthritis; Prostate cancer; Bladder neck contracture; Elevated prostate specific antigen (PSA); and Urethral stricture unspecified.    PAST SURGICAL HISTORY: Past Surgical History  Procedure Laterality Date  . Appendectomy  67 yrs old    open  . Av fistula placement, brachiocephalic  2012  . Robot assisted laparoscopic radical prostatectomy N/A 05/18/2012    Procedure: ROBOTIC ASSISTED LAPAROSCOPIC RADICAL PROSTATECTOMY;  Surgeon: Sebastian Ache, MD;  Location: [facility];  Service: Urology;  Laterality: N/A;  . Lymphadenectomy Bilateral 05/18/2012    Procedure: LYMPHADENECTOMY;  Surgeon: Sebastian Ache, MD;  Location: [facility];  Service: Urology;  Laterality: Bilateral;  . Circumcision, non-newborn    . Kidney surgery    . Pelvic laparoscopy      FAMILY HISTORY: family history includes Diabetes in his brother; Hypertension in his brother, brother, brother, sister, and sister; Kidney disease in his brother; Stroke in his mother.  SOCIAL HISTORY:  reports that he has been smoking Cigarettes.  He has a 40 pack-year smoking history. He has never used smokeless tobacco. He reports that he does not drink alcohol or use illicit drugs.  ALLERGIES: Review of patient's allergies indicates no known allergies.  MEDICATIONS:  Current Outpatient Prescriptions  Medication Sig Dispense Refill  . amLODipine (NORVASC) 10 MG tablet  Take 10 mg by mouth every morning.       Marland Kitchen aspirin 81 MG tablet Take 81 mg by mouth daily.      Marland Kitchen atorvastatin  (LIPITOR) 20 MG tablet Take 20 mg by mouth every morning.      . calcitRIOL (ROCALTROL) 0.25 MCG capsule Take 0.25 mcg by mouth daily.      . cephALEXin (KEFLEX) 250 MG capsule Take 1 capsule (250 mg total) by mouth daily. While catheter in place to prevent infection  14 capsule  0  . cholecalciferol (VITAMIN D) 1000 UNITS tablet Take 1,000 Units by mouth daily.      . cloNIDine (CATAPRES) 0.3 MG tablet Take 0.3 mg by mouth 2 (two) times daily.       . diclofenac sodium (VOLTAREN) 1 % GEL Apply 4 g topically 4 (four) times daily. On feet for gout      . furosemide (LASIX) 40 MG tablet Take 40 mg by mouth daily.      . hydrALAZINE (APRESOLINE) 100 MG tablet Take 100 mg by mouth 2 (two) times daily.       Marland Kitchen HYDROcodone-acetaminophen (NORCO) 5-325 MG per tablet Take 1-2 tablets by mouth every 6 (six) hours as needed for pain.  30 tablet  1  . metoprolol tartrate (LOPRESSOR) 25 MG tablet Take 25 mg by mouth 2 (two) times daily.        . sennosides-docusate sodium (SENOKOT-S) 8.6-50 MG tablet Take 1 tablet by mouth 2 (two) times daily. While taking pain meds to prevent constipation.  30 tablet  1  . sodium chloride (OCEAN) 0.65 % nasal spray Place 1 spray into the nose as needed (for nose bleed).      . tamsulosin (FLOMAX) 0.4 MG CAPS capsule Take by mouth.       No current facility-administered medications for this encounter.    REVIEW OF SYSTEMS:  A 15 point review of systems is documented in the electronic medical record. This was obtained by the nursing staff. However, I reviewed this with the patient to discuss relevant findings and make appropriate changes.  A comprehensive review of systems was negative..  The patient completed an IPSS and IIEF questionnaire.  His IPSS score was 8 indicating mild urinary outflow obstructive symptoms.  He indicated that his erectile function is unable to complete sexual activity.   PHYSICAL EXAM: This patient is in no acute distress.  He is alert and oriented.    height is 5\' 7"  (1.702 m) and weight is 171 lb 14.4 oz (77.973 kg). His oral temperature is 97.9 F (36.6 C). His blood pressure is 150/70 and his pulse is 78. His respiration is 16 and oxygen saturation is 100%.  He exhibits no respiratory distress or labored breathing.  He appears neurologically intact.  His mood is pleasant.  His affect is appropriate.  Please note the digital rectal exam findings described above.  KPS = 100  LABORATORY DATA:  Lab Results  Component Value Date   WBC 11.1* 05/09/2012   HGB 10.4* 05/19/2012   HCT 30.5* 05/19/2012   MCV 93.3 05/09/2012   PLT 291 05/09/2012   Lab Results  Component Value Date   NA 138 05/20/2012   K 4.7 05/20/2012   CL 105 05/20/2012   CO2 22 05/20/2012   Lab Results  Component Value Date   ALT 10 06/15/2009   AST 17 06/15/2009   ALKPHOS 66 06/15/2009   BILITOT 0.4 06/15/2009     RADIOGRAPHY: No  results found.    IMPRESSION: This gentleman is a 67 y.o. 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  At this time, the patient would likely benefit from prostatic fossa radiotherapy.  PLAN:Today, I talked to the patient and family about the findings and work-up thus far.  We discussed the natural history of disease and general treatment, highlighting the role or radiotherapy in the management.  We discussed the available radiation techniques, and focused on the details of logistics and delivery.  We reviewed the anticipated acute and late sequelae associated with radiation in this setting.  The patient was encouraged to ask questions that I answered to the best of my ability.  I filled out a patient counseling form during our discussion including treatment diagrams.  We retained a copy for our records.  The patient would like to proceed with radiation and will be scheduled for CT simulation.  I spent 60 minutes minutes face  to face with the patient and more than 50% of that time was spent in counseling and/or coordination of care.   ------------------------------------------------  Artist Pais. Kathrynn Running, M.D.

## 2012-09-03 ENCOUNTER — Ambulatory Visit
Admission: RE | Admit: 2012-09-03 | Discharge: 2012-09-03 | Disposition: A | Discharge status: Home / Self Care | Payer: Medicare Other | Source: Ambulatory Visit | Attending: Radiation Oncology | Admitting: Radiation Oncology

## 2012-09-03 ENCOUNTER — Encounter: Payer: Self-pay | Admitting: Radiation Oncology

## 2012-09-03 VITALS — BP 150/70 | HR 78 | Temp 97.9°F | Resp 16 | Ht 67.0 in | Wt 171.9 lb

## 2012-09-03 DIAGNOSIS — E119 Type 2 diabetes mellitus without complications: Secondary | ICD-10-CM | POA: Insufficient documentation

## 2012-09-03 DIAGNOSIS — K219 Gastro-esophageal reflux disease without esophagitis: Secondary | ICD-10-CM | POA: Insufficient documentation

## 2012-09-03 DIAGNOSIS — Z79899 Other long term (current) drug therapy: Secondary | ICD-10-CM | POA: Insufficient documentation

## 2012-09-03 DIAGNOSIS — C61 Malignant neoplasm of prostate: Secondary | ICD-10-CM

## 2012-09-03 DIAGNOSIS — N2581 Secondary hyperparathyroidism of renal origin: Secondary | ICD-10-CM

## 2012-09-03 DIAGNOSIS — I10 Essential (primary) hypertension: Secondary | ICD-10-CM | POA: Insufficient documentation

## 2012-09-03 DIAGNOSIS — E785 Hyperlipidemia, unspecified: Secondary | ICD-10-CM | POA: Insufficient documentation

## 2012-09-03 DIAGNOSIS — Z9079 Acquired absence of other genital organ(s): Secondary | ICD-10-CM | POA: Insufficient documentation

## 2012-09-03 NOTE — Progress Notes (Signed)
Complete PATIENT MEASURE OF DISTRESS worksheet with a score of 0 submitted to social work.  

## 2012-09-03 NOTE — Progress Notes (Signed)
Reports that on average he doesn't get up during the night to void because he has cut his oral fluid intake back considerably. Reports a strong normal urine flow. Denies hematuria or dysuria. Reports taking flomax as directed. Reports that he has taken five antibiotic pills for an infection. Reports that he has completed the dose and his "urine is clear but sudsy." Reports the month prior he took 10 days worth of antibiotics. Reports urinary incontinence has resolved. Denies pain associated with bowel movement, denies blood in stool or diarrhea. Reports drinking two cans of Ensure per day. Denies bone pain but does report arthritis in his shoulders. Denies nausea, vomiting, headache ,dizziness, night sweats or unintentional weight loss.

## 2012-09-03 NOTE — Addendum Note (Signed)
Encounter addended by: Agnes [name], RN on: 09/03/2012  1:37 PM<BR>     Documentation filed: Notes Section

## 2012-09-03 NOTE — Progress Notes (Signed)
GU Location of Tumor / Histology: adenocarcinoma of prostate   If Prostate Cancer, Gleason Score is (4 + 3=7) and PSA is (7.1 pre-op)  Patient presented to Dr. Berneice Heinrich in January 2014 with slow but, progress rise in PSA  Biopsies of prostate (if applicable) revealed:     Past/Anticipated interventions by urology, if any: referral to Dr. Kathrynn Running and follow up with Dr. Berneice Heinrich in six months  Past/Anticipated interventions by medical oncology, if any: None  Weight changes, if any: None noted  Bowel/Bladder complaints, if any: incontinence has greatly improved with Kegel exercises   Nausea/Vomiting, if any: None noted  Pain issues, if any:  None noted  SAFETY ISSUES:  Prior radiation? NO  Pacemaker/ICD? NO  Possible current pregnancy? NO  Is the patient on methotrexate? NO  Current Complaints / other details:  67 year old male. Single. Divorced. Housekeeper. With chronic kidney disease and stress urinary incontinence.

## 2012-09-03 NOTE — Progress Notes (Signed)
See progress note under physician encounter. 

## 2012-09-14 ENCOUNTER — Ambulatory Visit
Admission: RE | Admit: 2012-09-14 | Discharge: 2012-09-14 | Disposition: A | Discharge status: Home / Self Care | Payer: Medicare Other | Source: Ambulatory Visit | Attending: Radiation Oncology | Admitting: Radiation Oncology

## 2012-09-14 DIAGNOSIS — Z510 Encounter for antineoplastic radiation therapy: Secondary | ICD-10-CM | POA: Diagnosis present

## 2012-09-14 DIAGNOSIS — Z9079 Acquired absence of other genital organ(s): Secondary | ICD-10-CM | POA: Diagnosis not present

## 2012-09-14 DIAGNOSIS — C61 Malignant neoplasm of prostate: Secondary | ICD-10-CM | POA: Diagnosis not present

## 2012-09-14 DIAGNOSIS — R300 Dysuria: Secondary | ICD-10-CM | POA: Diagnosis not present

## 2012-09-14 NOTE — Progress Notes (Signed)
  Radiation Oncology         [phone] ________________________________  Name: Delvin Hedeen MRN: [id number]  Date: 09/14/2012  DOB: 11-01-1945  SIMULATION AND TREATMENT PLANNING NOTE  DIAGNOSIS:  67 y.o. 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  Identity was confirmed.  All relevant records and images related to the planned course of therapy were reviewed.  The patient freely provided informed written consent to proceed with treatment after reviewing the details related to the planned course of therapy. The consent form was witnessed and verified by the simulation staff.  Then, the patient was set-up in a stable reproducible supine position for radiation therapy.  A vacuum lock pillow device was custom fabricated to position his legs in a reproducible immobilized position.  Then, I performed a urethrogram under sterile conditions to identify the prostatic apex.  CT images were obtained.  Surface markings were placed.  The CT images were loaded into the planning software.  Then the prostate target and avoidance structures including the rectum, bladder, bowel and hips were contoured.  Treatment planning then occurred.  The radiation prescription was entered and confirmed.  A total of 1 complex treatment device was fabricated. I have requested : Intensity Modulated Radiotherapy (IMRT) is medically necessary for this case for the following reason:  Rectal sparing.Marland Kitchen  PLAN:  The patient will receive 68.4 Gy in 38 fractions.  ________________________________  Artist Pais Kathrynn Running, M.D.

## 2012-09-18 DIAGNOSIS — Z510 Encounter for antineoplastic radiation therapy: Secondary | ICD-10-CM | POA: Diagnosis not present

## 2012-09-25 ENCOUNTER — Ambulatory Visit
Admission: RE | Admit: 2012-09-25 | Discharge: 2012-09-25 | Disposition: A | Discharge status: Home / Self Care | Payer: Medicare Other | Source: Ambulatory Visit | Attending: Radiation Oncology | Admitting: Radiation Oncology

## 2012-09-25 DIAGNOSIS — Z510 Encounter for antineoplastic radiation therapy: Secondary | ICD-10-CM | POA: Diagnosis not present

## 2012-09-26 ENCOUNTER — Ambulatory Visit
Admission: RE | Admit: 2012-09-26 | Discharge: 2012-09-26 | Disposition: A | Discharge status: Home / Self Care | Payer: Medicare Other | Source: Ambulatory Visit | Attending: Radiation Oncology | Admitting: Radiation Oncology

## 2012-09-26 DIAGNOSIS — Z510 Encounter for antineoplastic radiation therapy: Secondary | ICD-10-CM | POA: Diagnosis not present

## 2012-09-27 ENCOUNTER — Ambulatory Visit
Admission: RE | Admit: 2012-09-27 | Discharge: 2012-09-27 | Disposition: A | Discharge status: Home / Self Care | Payer: Medicare Other | Source: Ambulatory Visit | Attending: Radiation Oncology | Admitting: Radiation Oncology

## 2012-09-27 ENCOUNTER — Encounter: Payer: Self-pay | Admitting: Radiation Oncology

## 2012-09-27 VITALS — BP 130/57 | HR 69 | Temp 97.8°F | Resp 20 | Wt 173.4 lb

## 2012-09-27 DIAGNOSIS — C61 Malignant neoplasm of prostate: Secondary | ICD-10-CM

## 2012-09-27 DIAGNOSIS — Z510 Encounter for antineoplastic radiation therapy: Secondary | ICD-10-CM | POA: Diagnosis not present

## 2012-09-27 NOTE — Progress Notes (Addendum)
Pt denies pain, urinary, bowel issues, fatigue, loss of appetite. Post sim ed completed w/pt and wife. Gave pt "Radiation and You' booklet w/all pertinent information marked and discussed, re: fatigue, urinary issues/management, nutrition, pain. All questions answered. Pt and wife verbalized understanding.

## 2012-09-27 NOTE — Progress Notes (Signed)
  Radiation Oncology         [phone] ________________________________  Name: [name] [name] MRN: [id number]  Date: 09/27/2012  DOB: 1945/11/23  Weekly Radiation Therapy Management  Current Dose: 5.4 Gy     Planned Dose:  68.4 Gy  Narrative . . . . . . . . The patient presents for routine under treatment assessment.                                                      The patient is without complaint.                                 Set-up films were reviewed.                                 The chart was checked. Physical Findings. . .  weight is 173 lb 6.4 oz (78.654 kg). His oral temperature is 97.8 F (36.6 C). His blood pressure is 130/57 and his pulse is 69. His respiration is 20. . Weight essentially stable.  No significant changes. Impression . . . . . . . The patient is  tolerating radiation. Plan . . . . . . . . . . . . Continue treatment as planned.  ________________________________  Artist Pais. Kathrynn Running, M.D.

## 2012-09-28 ENCOUNTER — Ambulatory Visit
Admission: RE | Admit: 2012-09-28 | Discharge: 2012-09-28 | Disposition: A | Discharge status: Home / Self Care | Payer: Medicare Other | Source: Ambulatory Visit | Attending: Radiation Oncology | Admitting: Radiation Oncology

## 2012-09-28 DIAGNOSIS — Z510 Encounter for antineoplastic radiation therapy: Secondary | ICD-10-CM | POA: Diagnosis not present

## 2012-10-01 ENCOUNTER — Ambulatory Visit
Admission: RE | Admit: 2012-10-01 | Discharge: 2012-10-01 | Disposition: A | Discharge status: Home / Self Care | Payer: Medicare Other | Source: Ambulatory Visit | Attending: Radiation Oncology | Admitting: Radiation Oncology

## 2012-10-01 DIAGNOSIS — Z510 Encounter for antineoplastic radiation therapy: Secondary | ICD-10-CM | POA: Diagnosis not present

## 2012-10-02 ENCOUNTER — Ambulatory Visit
Admission: RE | Admit: 2012-10-02 | Discharge: 2012-10-02 | Disposition: A | Discharge status: Home / Self Care | Payer: Medicare Other | Source: Ambulatory Visit | Attending: Radiation Oncology | Admitting: Radiation Oncology

## 2012-10-02 DIAGNOSIS — Z510 Encounter for antineoplastic radiation therapy: Secondary | ICD-10-CM | POA: Diagnosis not present

## 2012-10-03 ENCOUNTER — Ambulatory Visit
Admission: RE | Admit: 2012-10-03 | Discharge: 2012-10-03 | Disposition: A | Discharge status: Home / Self Care | Payer: Medicare Other | Source: Ambulatory Visit | Attending: Radiation Oncology | Admitting: Radiation Oncology

## 2012-10-03 DIAGNOSIS — Z510 Encounter for antineoplastic radiation therapy: Secondary | ICD-10-CM | POA: Diagnosis not present

## 2012-10-04 ENCOUNTER — Ambulatory Visit
Admission: RE | Admit: 2012-10-04 | Discharge: 2012-10-04 | Disposition: A | Discharge status: Home / Self Care | Payer: Medicare Other | Source: Ambulatory Visit | Attending: Radiation Oncology | Admitting: Radiation Oncology

## 2012-10-04 DIAGNOSIS — Z510 Encounter for antineoplastic radiation therapy: Secondary | ICD-10-CM | POA: Diagnosis not present

## 2012-10-05 ENCOUNTER — Ambulatory Visit
Admission: RE | Admit: 2012-10-05 | Discharge: 2012-10-05 | Disposition: A | Discharge status: Home / Self Care | Payer: Medicare Other | Source: Ambulatory Visit | Attending: Radiation Oncology | Admitting: Radiation Oncology

## 2012-10-05 ENCOUNTER — Encounter: Payer: Self-pay | Admitting: Radiation Oncology

## 2012-10-05 VITALS — BP 138/63 | HR 67 | Resp 16 | Wt 171.4 lb

## 2012-10-05 DIAGNOSIS — C61 Malignant neoplasm of prostate: Secondary | ICD-10-CM

## 2012-10-05 DIAGNOSIS — Z510 Encounter for antineoplastic radiation therapy: Secondary | ICD-10-CM | POA: Diagnosis not present

## 2012-10-05 NOTE — Progress Notes (Addendum)
  Radiation Oncology         [phone] ________________________________  Name: Elijiah Mickley MRN: [id number]  Date: 10/05/2012  DOB: 06/09/1945  Weekly Radiation Therapy Management  Current Dose: 16.2 Gy     Planned Dose:  68.4 Gy  Narrative . . . . . . . . The patient presents for routine under treatment assessment.                                  The patient is without complaint.                                 Set-up films were reviewed.                                 The chart was checked. Physical Findings. . .  weight is 171 lb 6.4 oz (77.747 kg). His blood pressure is 138/63 and his pulse is 67. His respiration is 16. . Weight essentially stable.  No significant changes. Impression . . . . . . . The patient is tolerating radiation. Plan . . . . . . . . . . . . Continue treatment as planned.  ________________________________  Artist Pais. Kathrynn Running, M.D.

## 2012-10-05 NOTE — Addendum Note (Signed)
Encounter addended by: Oneita Hurt, MD on: 10/05/2012  8:55 AM<BR>     Documentation filed: Notes Section

## 2012-10-05 NOTE — Progress Notes (Signed)
Reports a strong urine stream and denies difficulty emptying his bladder. Denies hematuria or dysuria. Denies urgency and incontinence. Reports intermittent burning with urination. Reports on average he gets up twice during the night to void and three times if he drinks a lot before bed. Denies diarrhea, pain or blood associated with bowel movements.

## 2012-10-08 ENCOUNTER — Ambulatory Visit
Admission: RE | Admit: 2012-10-08 | Discharge: 2012-10-08 | Disposition: A | Discharge status: Home / Self Care | Payer: Medicare Other | Source: Ambulatory Visit | Attending: Radiation Oncology | Admitting: Radiation Oncology

## 2012-10-08 DIAGNOSIS — Z510 Encounter for antineoplastic radiation therapy: Secondary | ICD-10-CM | POA: Diagnosis not present

## 2012-10-09 ENCOUNTER — Ambulatory Visit
Admission: RE | Admit: 2012-10-09 | Discharge: 2012-10-09 | Disposition: A | Discharge status: Home / Self Care | Payer: Medicare Other | Source: Ambulatory Visit | Attending: Radiation Oncology | Admitting: Radiation Oncology

## 2012-10-09 DIAGNOSIS — Z510 Encounter for antineoplastic radiation therapy: Secondary | ICD-10-CM | POA: Diagnosis not present

## 2012-10-10 ENCOUNTER — Ambulatory Visit
Admission: RE | Admit: 2012-10-10 | Discharge: 2012-10-10 | Disposition: A | Discharge status: Home / Self Care | Payer: Medicare Other | Source: Ambulatory Visit | Attending: Radiation Oncology | Admitting: Radiation Oncology

## 2012-10-10 DIAGNOSIS — Z510 Encounter for antineoplastic radiation therapy: Secondary | ICD-10-CM | POA: Diagnosis not present

## 2012-10-11 ENCOUNTER — Ambulatory Visit
Admission: RE | Admit: 2012-10-11 | Discharge: 2012-10-11 | Disposition: A | Discharge status: Home / Self Care | Payer: Medicare Other | Source: Ambulatory Visit | Attending: Radiation Oncology | Admitting: Radiation Oncology

## 2012-10-11 DIAGNOSIS — Z510 Encounter for antineoplastic radiation therapy: Secondary | ICD-10-CM | POA: Diagnosis not present

## 2012-10-12 ENCOUNTER — Encounter: Payer: Self-pay | Admitting: Radiation Oncology

## 2012-10-12 ENCOUNTER — Ambulatory Visit
Admission: RE | Admit: 2012-10-12 | Discharge: 2012-10-12 | Disposition: A | Discharge status: Home / Self Care | Payer: Medicare Other | Source: Ambulatory Visit | Attending: Radiation Oncology | Admitting: Radiation Oncology

## 2012-10-12 VITALS — BP 164/74 | HR 81 | Resp 16 | Wt 171.9 lb

## 2012-10-12 DIAGNOSIS — C61 Malignant neoplasm of prostate: Secondary | ICD-10-CM

## 2012-10-12 DIAGNOSIS — Z510 Encounter for antineoplastic radiation therapy: Secondary | ICD-10-CM | POA: Diagnosis not present

## 2012-10-12 NOTE — Progress Notes (Signed)
  Radiation Oncology         [phone] ________________________________  Name: [name] [name] MRN: [id number]  Date: 10/12/2012  DOB: 30-Mar-1945  Weekly Radiation Therapy Management  Current Dose: 25.2 Gy     Planned Dose:  68.4 Gy  Narrative . . . . . . . . The patient presents for routine under treatment assessment.                                                      Reports a strong urine stream and denies difficulty emptying his bladder. Denies hematuria. Denies urgency and incontinence. Reports intermittent burning with urination. Reports on average he gets up twice during the night to void and three times if he drinks a lot before bed. Denies diarrhea, pain or blood associated with bowel movements. Reports drinking a ensure each morning and each night. Weight stable                                 Set-up films were reviewed.                                 The chart was checked. Physical Findings. . .  weight is 171 lb 14.4 oz (77.973 kg). His blood pressure is 164/74 and his pulse is 81. His respiration is 16. . Weight essentially stable.  No significant changes. Impression . . . . . . . The patient is tolerating radiation. Plan . . . . . . . . . . . . Continue treatment as planned.  ________________________________  Artist Pais. Kathrynn Running, M.D.

## 2012-10-12 NOTE — Progress Notes (Signed)
Reports a strong urine stream and denies difficulty emptying his bladder. Denies hematuria. Denies urgency and incontinence. Reports intermittent burning with urination. Reports on average he gets up twice during the night to void and three times if he drinks a lot before bed. Denies diarrhea, pain or blood associated with bowel movements. Reports drinking a ensure each morning and each night. Weight stable.

## 2012-10-15 ENCOUNTER — Ambulatory Visit
Admission: RE | Admit: 2012-10-15 | Discharge: 2012-10-15 | Disposition: A | Discharge status: Home / Self Care | Payer: Medicare Other | Source: Ambulatory Visit | Attending: Radiation Oncology | Admitting: Radiation Oncology

## 2012-10-15 DIAGNOSIS — Z510 Encounter for antineoplastic radiation therapy: Secondary | ICD-10-CM | POA: Diagnosis not present

## 2012-10-16 ENCOUNTER — Ambulatory Visit
Admission: RE | Admit: 2012-10-16 | Discharge: 2012-10-16 | Disposition: A | Discharge status: Home / Self Care | Payer: Medicare Other | Source: Ambulatory Visit | Attending: Radiation Oncology | Admitting: Radiation Oncology

## 2012-10-16 DIAGNOSIS — Z510 Encounter for antineoplastic radiation therapy: Secondary | ICD-10-CM | POA: Diagnosis not present

## 2012-10-17 ENCOUNTER — Ambulatory Visit
Admission: RE | Admit: 2012-10-17 | Discharge: 2012-10-17 | Disposition: A | Discharge status: Home / Self Care | Payer: Medicare Other | Source: Ambulatory Visit | Attending: Radiation Oncology | Admitting: Radiation Oncology

## 2012-10-17 ENCOUNTER — Encounter: Payer: Self-pay | Admitting: Radiation Oncology

## 2012-10-17 ENCOUNTER — Telehealth: Payer: Self-pay | Admitting: *Deleted

## 2012-10-17 VITALS — BP 139/65 | HR 72 | Resp 16 | Wt 171.2 lb

## 2012-10-17 DIAGNOSIS — C61 Malignant neoplasm of prostate: Secondary | ICD-10-CM

## 2012-10-17 DIAGNOSIS — Z510 Encounter for antineoplastic radiation therapy: Secondary | ICD-10-CM | POA: Diagnosis not present

## 2012-10-17 NOTE — Telephone Encounter (Signed)
error 

## 2012-10-17 NOTE — Progress Notes (Signed)
  Radiation Oncology         (336) [phone] ________________________________  Name: [name] [name] MRN: [id number]  Date: 10/17/2012  DOB: 13-Dec-1945  Weekly Radiation Therapy Management  Current Dose: 30.6 Gy     Planned Dose:  68.4 Gy  Narrative . . . . . . . . The patient presents for routine under treatment assessment.                                             Reports a strong urine stream and denies difficulty emptying his bladder. Denies hematuria. Denies urgency and incontinence. Reports intermittent burning with urination. Reports on average he gets up twice during the night to void and three times if he drinks a lot before bed. However, patient reports that he did not have to get up last night to void at all. Denies diarrhea, pain or blood associated with bowel movements. Reports drinking a ensure each morning and each night.                                  Set-up films were reviewed.                                 The chart was checked. Physical Findings. . .  weight is 171 lb 3.2 oz (77.656 kg). His blood pressure is 139/65 and his pulse is 72. His respiration is 16. . Weight essentially stable.  No significant changes. Impression . . . . . . . The patient is tolerating radiation. Plan . . . . . . . . . . . . Continue treatment as planned.  ________________________________  Artist Pais. Kathrynn Running, M.D.

## 2012-10-17 NOTE — Progress Notes (Signed)
Reports a strong urine stream and denies difficulty emptying his bladder. Denies hematuria. Denies urgency and incontinence. Reports intermittent burning with urination. Reports on average he gets up twice during the night to void and three times if he drinks a lot before bed. However, patient reports that he did not have to get up last night to void at all. Denies diarrhea, pain or blood associated with bowel movements. Reports drinking a ensure each morning and each night. Weight stable.

## 2012-10-18 ENCOUNTER — Ambulatory Visit
Admission: RE | Admit: 2012-10-18 | Discharge: 2012-10-18 | Disposition: A | Discharge status: Home / Self Care | Payer: Medicare Other | Source: Ambulatory Visit | Attending: Radiation Oncology | Admitting: Radiation Oncology

## 2012-10-18 DIAGNOSIS — Z510 Encounter for antineoplastic radiation therapy: Secondary | ICD-10-CM | POA: Diagnosis not present

## 2012-10-19 ENCOUNTER — Ambulatory Visit
Admission: RE | Admit: 2012-10-19 | Discharge: 2012-10-19 | Disposition: A | Discharge status: Home / Self Care | Payer: Medicare Other | Source: Ambulatory Visit | Attending: Radiation Oncology | Admitting: Radiation Oncology

## 2012-10-19 DIAGNOSIS — Z510 Encounter for antineoplastic radiation therapy: Secondary | ICD-10-CM | POA: Diagnosis not present

## 2012-10-22 ENCOUNTER — Ambulatory Visit
Admission: RE | Admit: 2012-10-22 | Discharge: 2012-10-22 | Disposition: A | Discharge status: Home / Self Care | Payer: Medicare Other | Source: Ambulatory Visit | Attending: Radiation Oncology | Admitting: Radiation Oncology

## 2012-10-22 DIAGNOSIS — Z510 Encounter for antineoplastic radiation therapy: Secondary | ICD-10-CM | POA: Diagnosis not present

## 2012-10-23 ENCOUNTER — Ambulatory Visit
Admission: RE | Admit: 2012-10-23 | Discharge: 2012-10-23 | Disposition: A | Discharge status: Home / Self Care | Payer: Medicare Other | Source: Ambulatory Visit | Attending: Radiation Oncology | Admitting: Radiation Oncology

## 2012-10-23 DIAGNOSIS — Z510 Encounter for antineoplastic radiation therapy: Secondary | ICD-10-CM | POA: Diagnosis not present

## 2012-10-24 ENCOUNTER — Ambulatory Visit
Admission: RE | Admit: 2012-10-24 | Discharge: 2012-10-24 | Disposition: A | Discharge status: Home / Self Care | Payer: Medicare Other | Source: Ambulatory Visit | Attending: Radiation Oncology | Admitting: Radiation Oncology

## 2012-10-24 DIAGNOSIS — Z510 Encounter for antineoplastic radiation therapy: Secondary | ICD-10-CM | POA: Diagnosis not present

## 2012-10-25 ENCOUNTER — Ambulatory Visit
Admission: RE | Admit: 2012-10-25 | Discharge: 2012-10-25 | Disposition: A | Discharge status: Home / Self Care | Payer: Medicare Other | Source: Ambulatory Visit | Attending: Radiation Oncology | Admitting: Radiation Oncology

## 2012-10-25 DIAGNOSIS — Z510 Encounter for antineoplastic radiation therapy: Secondary | ICD-10-CM | POA: Diagnosis not present

## 2012-10-26 ENCOUNTER — Ambulatory Visit
Admission: RE | Admit: 2012-10-26 | Discharge: 2012-10-26 | Disposition: A | Discharge status: Home / Self Care | Payer: Medicare Other | Source: Ambulatory Visit | Attending: Radiation Oncology | Admitting: Radiation Oncology

## 2012-10-26 ENCOUNTER — Encounter: Payer: Self-pay | Admitting: Radiation Oncology

## 2012-10-26 VITALS — BP 125/60 | HR 68 | Resp 18 | Wt 173.5 lb

## 2012-10-26 DIAGNOSIS — C61 Malignant neoplasm of prostate: Secondary | ICD-10-CM

## 2012-10-26 DIAGNOSIS — Z510 Encounter for antineoplastic radiation therapy: Secondary | ICD-10-CM | POA: Diagnosis not present

## 2012-10-26 NOTE — Progress Notes (Signed)
Reports dysuria. Denies hematuria. Reports that he voided twice during the night. Denies diarrhea. Denies fatigue. Reports a strong urine stream. Denies difficulty emptying his bladder. Denies urgency or incontinence.

## 2012-10-26 NOTE — Progress Notes (Signed)
Department of Radiation Oncology  Phone:  [phone] Fax:        [phone]  Weekly Treatment Note    Name: [name] [name] Date: 10/26/2012 MRN: [id number] DOB: [birth year]   Current dose: 43.2 Gy  Current fraction: 24   MEDICATIONS: Current Outpatient Prescriptions  Medication Sig Dispense Refill  . amLODipine (NORVASC) 10 MG tablet Take 10 mg by mouth every morning.       Marland Kitchen aspirin 81 MG tablet Take 81 mg by mouth daily.      Marland Kitchen atorvastatin (LIPITOR) 20 MG tablet Take 20 mg by mouth every morning.      . calcitRIOL (ROCALTROL) 0.25 MCG capsule Take 0.25 mcg by mouth daily.      . cephALEXin (KEFLEX) 250 MG capsule Take 1 capsule (250 mg total) by mouth daily. While catheter in place to prevent infection  14 capsule  0  . cholecalciferol (VITAMIN D) 1000 UNITS tablet Take 1,000 Units by mouth daily.      . cloNIDine (CATAPRES) 0.3 MG tablet Take 0.3 mg by mouth 2 (two) times daily.       . diclofenac sodium (VOLTAREN) 1 % GEL Apply 4 g topically 4 (four) times daily. On feet for gout      . furosemide (LASIX) 40 MG tablet Take 40 mg by mouth daily.      . hydrALAZINE (APRESOLINE) 100 MG tablet Take 100 mg by mouth 2 (two) times daily.       . metoprolol tartrate (LOPRESSOR) 25 MG tablet Take 25 mg by mouth 2 (two) times daily.        . sennosides-docusate sodium (SENOKOT-S) 8.6-50 MG tablet Take 1 tablet by mouth 2 (two) times daily. While taking pain meds to prevent constipation.  30 tablet  1  . sodium chloride (OCEAN) 0.65 % nasal spray Place 1 spray into the nose as needed (for nose bleed).      . tamsulosin (FLOMAX) 0.4 MG CAPS capsule Take by mouth.      Marland Kitchen HYDROcodone-acetaminophen (NORCO) 5-325 MG per tablet Take 1-2 tablets by mouth every 6 (six) hours as needed for pain.  30 tablet  1   No current facility-administered medications for this encounter.     ALLERGIES: Review of patient's allergies indicates no known allergies.   LABORATORY DATA:  Lab Results    Component Value Date   WBC 11.1* 05/09/2012   HGB 10.4* 05/19/2012   HCT 30.5* 05/19/2012   MCV 93.3 05/09/2012   PLT 291 05/09/2012   Lab Results  Component Value Date   NA 138 05/20/2012   K 4.7 05/20/2012   CL 105 05/20/2012   CO2 22 05/20/2012   Lab Results  Component Value Date   ALT 10 06/15/2009   AST 17 06/15/2009   ALKPHOS 66 06/15/2009   BILITOT 0.4 06/15/2009     NARRATIVE: Praneel Haisley was seen today for weekly treatment management. The chart was checked and the patient's films were reviewed. The patient is doing well. Clinically he is stable. He reports a strong stream with nocturia approximately 2 times per night. The patient does have just a little bit of dysuria at the end of urination. This really is not bothering him at this time.  PHYSICAL EXAMINATION: weight is 173 lb 8 oz (78.699 kg). His blood pressure is 125/60 and his pulse is 68. His respiration is 18.        ASSESSMENT: The patient is doing satisfactorily with treatment.  PLAN: We will  continue with the patient's radiation treatment as planned.

## 2012-10-29 ENCOUNTER — Ambulatory Visit
Admission: RE | Admit: 2012-10-29 | Discharge: 2012-10-29 | Disposition: A | Discharge status: Home / Self Care | Payer: Medicare Other | Source: Ambulatory Visit | Attending: Radiation Oncology | Admitting: Radiation Oncology

## 2012-10-29 DIAGNOSIS — Z510 Encounter for antineoplastic radiation therapy: Secondary | ICD-10-CM | POA: Diagnosis not present

## 2012-10-30 ENCOUNTER — Ambulatory Visit
Admission: RE | Admit: 2012-10-30 | Discharge: 2012-10-30 | Disposition: A | Discharge status: Home / Self Care | Payer: Medicare Other | Source: Ambulatory Visit | Attending: Radiation Oncology | Admitting: Radiation Oncology

## 2012-10-30 DIAGNOSIS — Z510 Encounter for antineoplastic radiation therapy: Secondary | ICD-10-CM | POA: Diagnosis not present

## 2012-10-31 ENCOUNTER — Encounter: Payer: Self-pay | Admitting: Radiation Oncology

## 2012-10-31 ENCOUNTER — Ambulatory Visit
Admission: RE | Admit: 2012-10-31 | Discharge: 2012-10-31 | Disposition: A | Discharge status: Home / Self Care | Payer: Medicare Other | Source: Ambulatory Visit | Attending: Radiation Oncology | Admitting: Radiation Oncology

## 2012-10-31 VITALS — BP 156/79 | HR 74 | Resp 18 | Wt 173.7 lb

## 2012-10-31 DIAGNOSIS — C61 Malignant neoplasm of prostate: Secondary | ICD-10-CM

## 2012-10-31 DIAGNOSIS — Z510 Encounter for antineoplastic radiation therapy: Secondary | ICD-10-CM | POA: Diagnosis not present

## 2012-10-31 NOTE — Progress Notes (Signed)
  Radiation Oncology         [phone] ________________________________  Name: [name] [name]  MRN: [id number]  Date: 10/31/2012  DOB: [birth year]  Weekly Radiation Therapy Management  Current Dose: 48.6 Gy     Planned Dose:  68.4 Gy  Narrative . . . . . . . . The patient presents for routine under treatment assessment.                                                      The patient is without complaint.                                 Set-up films were reviewed.                                 The chart was checked. Physical Findings. . .  weight is 173 lb 11.2 oz (78.79 kg). His blood pressure is 156/79 and his pulse is 74. His respiration is 18. . Weight essentially stable.  No significant changes. Impression . . . . . . . The patient is tolerating radiation. Plan . . . . . . . . . . . . Continue treatment as planned.  ________________________________  Artist Pais. Kathrynn Running, M.D.

## 2012-10-31 NOTE — Progress Notes (Signed)
Reports his father passed away this week of cancer at 90 something. Blood pressure elevated this morning. Patient reports he didn't sleep well last night and hasn't taken his bp medication yet this morning. Reports burning with urination at the end of the stream. Reports on average he gets up twice during the night. Denies diarrhea. Denies hematuria. Reports a strong steady urine stream.

## 2012-11-01 ENCOUNTER — Ambulatory Visit
Admission: RE | Admit: 2012-11-01 | Discharge: 2012-11-01 | Disposition: A | Discharge status: Home / Self Care | Payer: Medicare Other | Source: Ambulatory Visit | Attending: Radiation Oncology | Admitting: Radiation Oncology

## 2012-11-01 DIAGNOSIS — Z510 Encounter for antineoplastic radiation therapy: Secondary | ICD-10-CM | POA: Diagnosis not present

## 2012-11-02 ENCOUNTER — Ambulatory Visit
Admission: RE | Admit: 2012-11-02 | Discharge: 2012-11-02 | Disposition: A | Discharge status: Home / Self Care | Payer: Medicare Other | Source: Ambulatory Visit | Attending: Radiation Oncology | Admitting: Radiation Oncology

## 2012-11-02 DIAGNOSIS — Z510 Encounter for antineoplastic radiation therapy: Secondary | ICD-10-CM | POA: Diagnosis not present

## 2012-11-05 ENCOUNTER — Ambulatory Visit
Admission: RE | Admit: 2012-11-05 | Discharge: 2012-11-05 | Disposition: A | Discharge status: Home / Self Care | Payer: Medicare Other | Source: Ambulatory Visit | Attending: Radiation Oncology | Admitting: Radiation Oncology

## 2012-11-05 DIAGNOSIS — Z510 Encounter for antineoplastic radiation therapy: Secondary | ICD-10-CM | POA: Diagnosis not present

## 2012-11-06 ENCOUNTER — Ambulatory Visit
Admission: RE | Admit: 2012-11-06 | Discharge: 2012-11-06 | Disposition: A | Discharge status: Home / Self Care | Payer: Medicare Other | Source: Ambulatory Visit | Attending: Radiation Oncology | Admitting: Radiation Oncology

## 2012-11-06 DIAGNOSIS — Z510 Encounter for antineoplastic radiation therapy: Secondary | ICD-10-CM | POA: Diagnosis not present

## 2012-11-07 ENCOUNTER — Ambulatory Visit
Admission: RE | Admit: 2012-11-07 | Discharge: 2012-11-07 | Disposition: A | Discharge status: Home / Self Care | Payer: Medicare Other | Source: Ambulatory Visit | Attending: Radiation Oncology | Admitting: Radiation Oncology

## 2012-11-07 DIAGNOSIS — Z510 Encounter for antineoplastic radiation therapy: Secondary | ICD-10-CM | POA: Diagnosis not present

## 2012-11-08 ENCOUNTER — Ambulatory Visit
Admission: RE | Admit: 2012-11-08 | Discharge: 2012-11-08 | Disposition: A | Discharge status: Home / Self Care | Payer: Medicare Other | Source: Ambulatory Visit | Attending: Radiation Oncology | Admitting: Radiation Oncology

## 2012-11-08 DIAGNOSIS — Z510 Encounter for antineoplastic radiation therapy: Secondary | ICD-10-CM | POA: Diagnosis not present

## 2012-11-09 ENCOUNTER — Ambulatory Visit
Admission: RE | Admit: 2012-11-09 | Discharge: 2012-11-09 | Disposition: A | Discharge status: Home / Self Care | Payer: Medicare Other | Source: Ambulatory Visit | Attending: Radiation Oncology | Admitting: Radiation Oncology

## 2012-11-09 ENCOUNTER — Encounter: Payer: Self-pay | Admitting: Radiation Oncology

## 2012-11-09 VITALS — BP 110/56 | HR 67 | Resp 16 | Wt 173.0 lb

## 2012-11-09 DIAGNOSIS — Z510 Encounter for antineoplastic radiation therapy: Secondary | ICD-10-CM | POA: Diagnosis not present

## 2012-11-09 DIAGNOSIS — C61 Malignant neoplasm of prostate: Secondary | ICD-10-CM

## 2012-11-09 NOTE — Progress Notes (Signed)
Reports dysuria. Reports on average he gets up twice during the night to void. Denies hematuria. Reports a strong urine stream. Reports difficulty completely emptying his bladder. Denies diarrhea. Weight stable. Denies fatigue.

## 2012-11-09 NOTE — Progress Notes (Signed)
   Department of Radiation Oncology  Phone:  [phone] Fax:        [phone]  Weekly Treatment Note    Name: [name] [name] Date: 11/09/2012 MRN: [id number] DOB: 1945/06/04   Current dose: 61.2 Gy  Current fraction: 34   MEDICATIONS: Current Outpatient Prescriptions  Medication Sig Dispense Refill  . amLODipine (NORVASC) 10 MG tablet Take 10 mg by mouth every morning.       Marland Kitchen aspirin 81 MG tablet Take 81 mg by mouth daily.      Marland Kitchen atorvastatin (LIPITOR) 20 MG tablet Take 20 mg by mouth every morning.      . calcitRIOL (ROCALTROL) 0.25 MCG capsule Take 0.25 mcg by mouth daily.      . cephALEXin (KEFLEX) 250 MG capsule Take 1 capsule (250 mg total) by mouth daily. While catheter in place to prevent infection  14 capsule  0  . cholecalciferol (VITAMIN D) 1000 UNITS tablet Take 1,000 Units by mouth daily.      . cloNIDine (CATAPRES) 0.3 MG tablet Take 0.3 mg by mouth 2 (two) times daily.       . diclofenac sodium (VOLTAREN) 1 % GEL Apply 4 g topically 4 (four) times daily. On feet for gout      . furosemide (LASIX) 40 MG tablet Take 40 mg by mouth daily.      . hydrALAZINE (APRESOLINE) 100 MG tablet Take 100 mg by mouth 2 (two) times daily.       Marland Kitchen HYDROcodone-acetaminophen (NORCO) 5-325 MG per tablet Take 1-2 tablets by mouth every 6 (six) hours as needed for pain.  30 tablet  1  . metoprolol tartrate (LOPRESSOR) 25 MG tablet Take 25 mg by mouth 2 (two) times daily.        . sennosides-docusate sodium (SENOKOT-S) 8.6-50 MG tablet Take 1 tablet by mouth 2 (two) times daily. While taking pain meds to prevent constipation.  30 tablet  1  . sodium chloride (OCEAN) 0.65 % nasal spray Place 1 spray into the nose as needed (for nose bleed).      . tamsulosin (FLOMAX) 0.4 MG CAPS capsule Take by mouth.       No current facility-administered medications for this encounter.     ALLERGIES: Review of patient's allergies indicates no known allergies.   LABORATORY DATA:  Lab Results    Component Value Date   WBC 11.1* 05/09/2012   HGB 10.4* 05/19/2012   HCT 30.5* 05/19/2012   MCV 93.3 05/09/2012   PLT 291 05/09/2012   Lab Results  Component Value Date   NA 138 05/20/2012   K 4.7 05/20/2012   CL 105 05/20/2012   CO2 22 05/20/2012   Lab Results  Component Value Date   ALT 10 06/15/2009   AST 17 06/15/2009   ALKPHOS 66 06/15/2009   BILITOT 0.4 06/15/2009     NARRATIVE: [name] [name] was seen today for weekly treatment management. The chart was checked and the patient's films were reviewed. The patient reports some dysuria. He has nocturia approximately 2 times per night. He has a strong urinary stream. Clinically he has been stable. He denies any fatigue.  PHYSICAL EXAMINATION: weight is 173 lb (78.472 kg). His blood pressure is 110/56 and his pulse is 67. His respiration is 16.        ASSESSMENT: The patient is doing satisfactorily with treatment.  PLAN: We will continue with the patient's radiation treatment as planned.

## 2012-11-12 ENCOUNTER — Ambulatory Visit
Admission: RE | Admit: 2012-11-12 | Discharge: 2012-11-12 | Disposition: A | Discharge status: Home / Self Care | Payer: Medicare Other | Source: Ambulatory Visit | Attending: Radiation Oncology | Admitting: Radiation Oncology

## 2012-11-12 DIAGNOSIS — Z510 Encounter for antineoplastic radiation therapy: Secondary | ICD-10-CM | POA: Diagnosis not present

## 2012-11-13 ENCOUNTER — Ambulatory Visit
Admission: RE | Admit: 2012-11-13 | Discharge: 2012-11-13 | Disposition: A | Discharge status: Home / Self Care | Payer: Medicare Other | Source: Ambulatory Visit | Attending: Radiation Oncology | Admitting: Radiation Oncology

## 2012-11-13 DIAGNOSIS — Z510 Encounter for antineoplastic radiation therapy: Secondary | ICD-10-CM | POA: Diagnosis not present

## 2012-11-14 ENCOUNTER — Ambulatory Visit
Admission: RE | Admit: 2012-11-14 | Discharge: 2012-11-14 | Disposition: A | Discharge status: Home / Self Care | Payer: Medicare Other | Source: Ambulatory Visit | Attending: Radiation Oncology | Admitting: Radiation Oncology

## 2012-11-14 ENCOUNTER — Encounter: Payer: Self-pay | Admitting: Radiation Oncology

## 2012-11-14 VITALS — BP 126/63 | HR 67 | Temp 98.0°F | Resp 16 | Wt 174.0 lb

## 2012-11-14 DIAGNOSIS — C61 Malignant neoplasm of prostate: Secondary | ICD-10-CM

## 2012-11-14 DIAGNOSIS — Z510 Encounter for antineoplastic radiation therapy: Secondary | ICD-10-CM | POA: Diagnosis not present

## 2012-11-14 NOTE — Progress Notes (Addendum)
Reports dysuria. Reports on average he gets up twice during the night to void. Denies hematuria. Reports a strong urine stream. Reports difficulty completely emptying his bladder. Denies diarrhea. Weight stable. Denies fatigue. Provided patient with one month follow up appointment card since he finishes tomorrow.

## 2012-11-14 NOTE — Progress Notes (Signed)
  Radiation Oncology         (336) [phone] ________________________________  Name: [name] [name] MRN: [id number]  Date: 11/14/2012  DOB: 1945/05/17  Weekly Radiation Therapy Management  Current Dose: 66.6 Gy     Planned Dose:  68.4 Gy  Narrative . . . . . . . . The patient presents for routine under treatment assessment.                                   The patient is without complaint.                                 Set-up films were reviewed.                                 The chart was checked. Physical Findings. . .  weight is 174 lb (78.926 kg). His oral temperature is 98 F (36.7 C). His blood pressure is 126/63 and his pulse is 67. His respiration is 16. . Weight essentially stable.  No significant changes. Impression . . . . . . . The patient is tolerating radiation. Plan . . . . . . . . . . . . Continue treatment as planned.  ________________________________  Artist Pais. Kathrynn Running, M.D.

## 2012-11-15 ENCOUNTER — Encounter: Payer: Self-pay | Admitting: Radiation Oncology

## 2012-11-15 ENCOUNTER — Ambulatory Visit
Admission: RE | Admit: 2012-11-15 | Discharge: 2012-11-15 | Disposition: A | Discharge status: Home / Self Care | Payer: Medicare Other | Source: Ambulatory Visit | Attending: Radiation Oncology | Admitting: Radiation Oncology

## 2012-11-15 DIAGNOSIS — Z510 Encounter for antineoplastic radiation therapy: Secondary | ICD-10-CM | POA: Diagnosis not present

## 2012-12-20 ENCOUNTER — Encounter: Payer: Self-pay | Admitting: Radiation Oncology

## 2012-12-20 ENCOUNTER — Ambulatory Visit
Admission: RE | Admit: 2012-12-20 | Discharge: 2012-12-20 | Disposition: A | Discharge status: Home / Self Care | Payer: Medicare Other | Source: Ambulatory Visit | Attending: Radiation Oncology | Admitting: Radiation Oncology

## 2012-12-20 VITALS — BP 153/73 | HR 75 | Temp 98.0°F | Resp 16 | Wt 173.6 lb

## 2012-12-20 DIAGNOSIS — C61 Malignant neoplasm of prostate: Secondary | ICD-10-CM

## 2012-12-20 NOTE — Progress Notes (Signed)
Reports dysuria has finally resolved. Denies hematuria. Reports a steady stream of urine. Reports frequency is less but, fluid intake is also less as directed "by kidney doctor." Reports on two occasions he passed gas and "dribbled a small amount of urine." Reports that he continues to take flomax as directed. Reports energy level is slowly improving. Has not set up urology follow up yet. Denies pain at this time.

## 2012-12-20 NOTE — Progress Notes (Signed)
Radiation Oncology         (336) [phone] ________________________________  Name: [name] [name] MRN: [id number]  Date: 12/20/2012  DOB: [birth year]  Follow-Up Visit Note  CC: Jackie Plum, MD  Sebastian Ache, MD  Diagnosis:   67 y.o. 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  Reports dysuria has finally resolved. Denies hematuria. Reports a steady stream of urine. Reports frequency is less but, fluid intake is also less as directed "by kidney doctor." Reports on two occasions he passed gas and "dribbled a small amount of urine." Reports that he continues to take flomax as directed. Reports energy level is slowly improving. Has not set up urology follow up yet. Denies pain at this time                              ALLERGIES:  has No Known Allergies.  Meds: Current Outpatient Prescriptions  Medication Sig Dispense Refill  . amLODipine (NORVASC) 10 MG tablet Take 10 mg by mouth every morning.       Marland Kitchen aspirin 81 MG tablet Take 81 mg by mouth daily.      Marland Kitchen atorvastatin (LIPITOR) 20 MG tablet Take 20 mg by mouth every morning.      . calcitRIOL (ROCALTROL) 0.25 MCG capsule Take 0.25 mcg by mouth daily.      . cephALEXin (KEFLEX) 250 MG capsule Take 1 capsule (250 mg total) by mouth daily. While catheter in place to prevent infection  14 capsule  0  . cholecalciferol (VITAMIN D) 1000 UNITS tablet Take 1,000 Units by mouth daily.      . cloNIDine (CATAPRES) 0.3 MG tablet Take 0.3 mg by mouth 2 (two) times daily.       . diclofenac sodium (VOLTAREN) 1 % GEL Apply 4 g topically 4 (four) times daily. On feet for gout      . furosemide (LASIX) 40 MG tablet Take 40 mg by mouth daily.      . hydrALAZINE (APRESOLINE) 100 MG tablet Take 100 mg by mouth 2 (two)  times daily.       Marland Kitchen HYDROcodone-acetaminophen (NORCO) 5-325 MG per tablet Take 1-2 tablets by mouth every 6 (six) hours as needed for pain.  30 tablet  1  . metoprolol tartrate (LOPRESSOR) 25 MG tablet Take 25 mg by mouth 2 (two) times daily.        . sennosides-docusate sodium (SENOKOT-S) 8.6-50 MG tablet Take 1 tablet by mouth 2 (two) times daily. While taking pain meds to prevent constipation.  30 tablet  1  . sodium chloride (OCEAN) 0.65 % nasal spray Place 1 spray into the nose as needed (for nose bleed).      . tamsulosin (FLOMAX) 0.4 MG CAPS capsule Take by mouth.       No current facility-administered medications for this encounter.    Physical Findings: The patient is in no acute distress. Patient is alert and oriented.  weight is 173 lb 9.6 oz (78.744 kg). His oral temperature is 98 F (36.7 C). His blood pressure is 153/73 and his pulse is 75. His respiration is 16 and oxygen saturation is 100%. .  No significant changes.  Impression:  The patient is recovering  from the effects of radiation.  Plan:  He will continue to follow-up with urology for ongoing PSA determinations.  I will look forward to following his response through their correspondence, and be happy to participate in care if clinically indicated.  I talked to the patient about what to expect in the future, including his risk for erectile dysfunction and rectal bleeding.  I encouraged him to call or return to the office if he has any question about his previous radiation or possible radiation effects.  He was comfortable with this plan.  _____________________________________  Artist Pais. Kathrynn Running, M.D.

## 2012-12-24 NOTE — Progress Notes (Signed)
  Radiation Oncology         (336) [phone] ________________________________  Name: [name] [name] MRN: [id number]  Date: 11/15/2012  DOB: 08/31/1945  End of Treatment Note  Diagnosis:   67 y.o. 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  Daily precise positioning was established with body fix immobilization custom molded. Image guidance was performed with daily megavoltage CT and table corrections.  Narrative: The patient tolerated radiation treatment relatively well.   The patient experienced some urinary outflow obstructive symptoms. He feels that he has incomplete emptying of his bladder. Otherwise, he had no significant bowel or bladder symptoms.  Plan: The patient has completed radiation treatment. The patient will return to radiation oncology clinic for routine followup in one month. I advised them to call or return sooner if they have any questions or concerns related to their recovery or treatment. ________________________________  Artist Pais. Kathrynn Running, M.D.

## 2013-03-29 ENCOUNTER — Encounter: Payer: Self-pay | Admitting: *Deleted

## 2013-04-21 ENCOUNTER — Emergency Department (HOSPITAL_COMMUNITY): Payer: Medicare Other

## 2013-04-21 ENCOUNTER — Encounter (HOSPITAL_COMMUNITY): Payer: Self-pay | Admitting: Emergency Medicine

## 2013-04-21 ENCOUNTER — Inpatient Hospital Stay (HOSPITAL_COMMUNITY)
Admission: EM | Admit: 2013-04-21 | Discharge: 2013-04-25 | DRG: 189 | Disposition: A | Discharge status: Home / Self Care | Payer: Medicare Other | Attending: Internal Medicine | Admitting: Internal Medicine

## 2013-04-21 DIAGNOSIS — E785 Hyperlipidemia, unspecified: Secondary | ICD-10-CM | POA: Diagnosis present

## 2013-04-21 DIAGNOSIS — I120 Hypertensive chronic kidney disease with stage 5 chronic kidney disease or end stage renal disease: Secondary | ICD-10-CM | POA: Diagnosis present

## 2013-04-21 DIAGNOSIS — I2789 Other specified pulmonary heart diseases: Secondary | ICD-10-CM | POA: Diagnosis present

## 2013-04-21 DIAGNOSIS — D631 Anemia in chronic kidney disease: Secondary | ICD-10-CM | POA: Diagnosis present

## 2013-04-21 DIAGNOSIS — N186 End stage renal disease: Secondary | ICD-10-CM | POA: Diagnosis present

## 2013-04-21 DIAGNOSIS — K219 Gastro-esophageal reflux disease without esophagitis: Secondary | ICD-10-CM | POA: Diagnosis present

## 2013-04-21 DIAGNOSIS — R7989 Other specified abnormal findings of blood chemistry: Secondary | ICD-10-CM

## 2013-04-21 DIAGNOSIS — R0600 Dyspnea, unspecified: Secondary | ICD-10-CM

## 2013-04-21 DIAGNOSIS — N2581 Secondary hyperparathyroidism of renal origin: Secondary | ICD-10-CM | POA: Diagnosis present

## 2013-04-21 DIAGNOSIS — Z7982 Long term (current) use of aspirin: Secondary | ICD-10-CM

## 2013-04-21 DIAGNOSIS — D62 Acute posthemorrhagic anemia: Secondary | ICD-10-CM | POA: Diagnosis present

## 2013-04-21 DIAGNOSIS — N039 Chronic nephritic syndrome with unspecified morphologic changes: Secondary | ICD-10-CM

## 2013-04-21 DIAGNOSIS — N189 Chronic kidney disease, unspecified: Secondary | ICD-10-CM

## 2013-04-21 DIAGNOSIS — D72829 Elevated white blood cell count, unspecified: Secondary | ICD-10-CM | POA: Diagnosis present

## 2013-04-21 DIAGNOSIS — I959 Hypotension, unspecified: Secondary | ICD-10-CM | POA: Diagnosis not present

## 2013-04-21 DIAGNOSIS — N179 Acute kidney failure, unspecified: Secondary | ICD-10-CM

## 2013-04-21 DIAGNOSIS — F17200 Nicotine dependence, unspecified, uncomplicated: Secondary | ICD-10-CM | POA: Diagnosis present

## 2013-04-21 DIAGNOSIS — I5043 Acute on chronic combined systolic (congestive) and diastolic (congestive) heart failure: Secondary | ICD-10-CM | POA: Diagnosis present

## 2013-04-21 DIAGNOSIS — E872 Acidosis, unspecified: Secondary | ICD-10-CM | POA: Diagnosis present

## 2013-04-21 DIAGNOSIS — R799 Abnormal finding of blood chemistry, unspecified: Secondary | ICD-10-CM

## 2013-04-21 DIAGNOSIS — I272 Pulmonary hypertension, unspecified: Secondary | ICD-10-CM | POA: Diagnosis present

## 2013-04-21 DIAGNOSIS — Z823 Family history of stroke: Secondary | ICD-10-CM

## 2013-04-21 DIAGNOSIS — R748 Abnormal levels of other serum enzymes: Secondary | ICD-10-CM | POA: Diagnosis present

## 2013-04-21 DIAGNOSIS — I059 Rheumatic mitral valve disease, unspecified: Secondary | ICD-10-CM | POA: Diagnosis present

## 2013-04-21 DIAGNOSIS — E119 Type 2 diabetes mellitus without complications: Secondary | ICD-10-CM | POA: Diagnosis present

## 2013-04-21 DIAGNOSIS — R778 Other specified abnormalities of plasma proteins: Secondary | ICD-10-CM | POA: Diagnosis present

## 2013-04-21 DIAGNOSIS — Z992 Dependence on renal dialysis: Secondary | ICD-10-CM

## 2013-04-21 DIAGNOSIS — M109 Gout, unspecified: Secondary | ICD-10-CM | POA: Diagnosis present

## 2013-04-21 DIAGNOSIS — J9600 Acute respiratory failure, unspecified whether with hypoxia or hypercapnia: Principal | ICD-10-CM | POA: Diagnosis present

## 2013-04-21 DIAGNOSIS — D649 Anemia, unspecified: Secondary | ICD-10-CM | POA: Diagnosis present

## 2013-04-21 DIAGNOSIS — R011 Cardiac murmur, unspecified: Secondary | ICD-10-CM

## 2013-04-21 DIAGNOSIS — Z8546 Personal history of malignant neoplasm of prostate: Secondary | ICD-10-CM

## 2013-04-21 DIAGNOSIS — Z8249 Family history of ischemic heart disease and other diseases of the circulatory system: Secondary | ICD-10-CM

## 2013-04-21 DIAGNOSIS — Z833 Family history of diabetes mellitus: Secondary | ICD-10-CM

## 2013-04-21 DIAGNOSIS — J810 Acute pulmonary edema: Secondary | ICD-10-CM

## 2013-04-21 DIAGNOSIS — Z79899 Other long term (current) drug therapy: Secondary | ICD-10-CM

## 2013-04-21 DIAGNOSIS — I509 Heart failure, unspecified: Secondary | ICD-10-CM | POA: Diagnosis present

## 2013-04-21 HISTORY — DX: Cardiac murmur, unspecified: R01.1

## 2013-04-21 HISTORY — DX: Heart failure, unspecified: I50.9

## 2013-04-21 HISTORY — DX: Shortness of breath: R06.02

## 2013-04-21 LAB — I-STAT ARTERIAL BLOOD GAS, ED
Acid-base deficit: 7 mmol/L — ABNORMAL HIGH (ref 0.0–2.0)
BICARBONATE: 16.6 meq/L — AB (ref 20.0–24.0)
O2 Saturation: 92 %
PCO2 ART: 27.1 mmHg — AB (ref 35.0–45.0)
PH ART: 7.396 (ref 7.350–7.450)
PO2 ART: 63 mmHg — AB (ref 80.0–100.0)
Patient temperature: 98.6
TCO2: 17 mmol/L (ref 0–100)

## 2013-04-21 LAB — COMPREHENSIVE METABOLIC PANEL
ALT: 10 U/L (ref 0–53)
AST: 12 U/L (ref 0–37)
Albumin: 3.3 g/dL — ABNORMAL LOW (ref 3.5–5.2)
Alkaline Phosphatase: 76 U/L (ref 39–117)
BILIRUBIN TOTAL: 0.3 mg/dL (ref 0.3–1.2)
BUN: 59 mg/dL — AB (ref 6–23)
CHLORIDE: 111 meq/L (ref 96–112)
CO2: 16 mEq/L — ABNORMAL LOW (ref 19–32)
Calcium: 9.6 mg/dL (ref 8.4–10.5)
Creatinine, Ser: 6.2 mg/dL — ABNORMAL HIGH (ref 0.50–1.35)
GFR calc Af Amer: 10 mL/min — ABNORMAL LOW (ref >90)
GFR calc non Af Amer: 8 mL/min — ABNORMAL LOW (ref >90)
Glucose, Bld: 136 mg/dL — ABNORMAL HIGH (ref 70–99)
Potassium: 4.8 mEq/L (ref 3.7–5.3)
Sodium: 144 mEq/L (ref 137–147)
Total Protein: 6.6 g/dL (ref 6.0–8.3)

## 2013-04-21 LAB — BASIC METABOLIC PANEL
BUN: 58 mg/dL — AB (ref 6–23)
CHLORIDE: 109 meq/L (ref 96–112)
CO2: 17 mEq/L — ABNORMAL LOW (ref 19–32)
Calcium: 9.7 mg/dL (ref 8.4–10.5)
Creatinine, Ser: 6.2 mg/dL — ABNORMAL HIGH (ref 0.50–1.35)
GFR calc Af Amer: 10 mL/min — ABNORMAL LOW (ref >90)
GFR, EST NON AFRICAN AMERICAN: 8 mL/min — AB (ref >90)
GLUCOSE: 112 mg/dL — AB (ref 70–99)
POTASSIUM: 4.6 meq/L (ref 3.7–5.3)
Sodium: 143 mEq/L (ref 137–147)

## 2013-04-21 LAB — URINALYSIS, ROUTINE W REFLEX MICROSCOPIC
Bilirubin Urine: NEGATIVE
GLUCOSE, UA: NEGATIVE mg/dL
HGB URINE DIPSTICK: NEGATIVE
Ketones, ur: NEGATIVE mg/dL
Leukocytes, UA: NEGATIVE
Nitrite: NEGATIVE
PH: 6.5 (ref 5.0–8.0)
Protein, ur: 100 mg/dL — AB
SPECIFIC GRAVITY, URINE: 1.01 (ref 1.005–1.030)
Urobilinogen, UA: 0.2 mg/dL (ref 0.0–1.0)

## 2013-04-21 LAB — TSH: TSH: 2.76 u[IU]/mL (ref 0.350–4.500)

## 2013-04-21 LAB — I-STAT TROPONIN, ED: Troponin i, poc: 0.13 ng/mL (ref 0.00–0.08)

## 2013-04-21 LAB — URINE MICROSCOPIC-ADD ON

## 2013-04-21 LAB — CBC
HEMATOCRIT: 29.9 % — AB (ref 39.0–52.0)
HEMOGLOBIN: 10 g/dL — AB (ref 13.0–17.0)
MCH: 31.9 pg (ref 26.0–34.0)
MCHC: 33.4 g/dL (ref 30.0–36.0)
MCV: 95.5 fL (ref 78.0–100.0)
Platelets: 255 10*3/uL (ref 150–400)
RBC: 3.13 MIL/uL — ABNORMAL LOW (ref 4.22–5.81)
RDW: 13.7 % (ref 11.5–15.5)
WBC: 12.7 10*3/uL — ABNORMAL HIGH (ref 4.0–10.5)

## 2013-04-21 LAB — MAGNESIUM: MAGNESIUM: 2.1 mg/dL (ref 1.5–2.5)

## 2013-04-21 LAB — TROPONIN I
Troponin I: 0.32 ng/mL (ref <0.30)
Troponin I: <0.3 ng/mL (ref <0.30)

## 2013-04-21 LAB — MRSA PCR SCREENING: MRSA BY PCR: NEGATIVE

## 2013-04-21 LAB — PRO B NATRIURETIC PEPTIDE: Pro B Natriuretic peptide (BNP): 10908 pg/mL — ABNORMAL HIGH (ref 0–125)

## 2013-04-21 MED ORDER — ASPIRIN 81 MG PO CHEW
324.0000 mg | CHEWABLE_TABLET | Freq: Once | ORAL | Status: AC
  Administered 2013-04-21: 324 mg via ORAL
  Filled 2013-04-21: qty 4

## 2013-04-21 MED ORDER — SODIUM CHLORIDE 0.9 % IV SOLN: 250.0000 mL | INTRAVENOUS | Status: DC | PRN

## 2013-04-21 MED ORDER — DEXTROSE 5 % IV SOLN
120.0000 mg | Freq: Three times a day (TID) | INTRAVENOUS | Status: DC
  Filled 2013-04-21 (×2): qty 12

## 2013-04-21 MED ORDER — PANTOPRAZOLE SODIUM 40 MG PO TBEC
40.0000 mg | DELAYED_RELEASE_TABLET | Freq: Every day | ORAL | Status: DC
  Administered 2013-04-22 – 2013-04-25 (×4): 40 mg via ORAL
  Filled 2013-04-21 (×4): qty 1

## 2013-04-21 MED ORDER — CALCITRIOL 0.25 MCG PO CAPS
0.2500 ug | ORAL_CAPSULE | Freq: Every day | ORAL | Status: DC
  Administered 2013-04-21 – 2013-04-24 (×4): 0.25 ug via ORAL
  Filled 2013-04-21 (×5): qty 1

## 2013-04-21 MED ORDER — TAMSULOSIN HCL 0.4 MG PO CAPS
0.4000 mg | ORAL_CAPSULE | Freq: Every day | ORAL | Status: DC
  Administered 2013-04-21 – 2013-04-24 (×4): 0.4 mg via ORAL
  Filled 2013-04-21 (×5): qty 1

## 2013-04-21 MED ORDER — FUROSEMIDE 10 MG/ML IJ SOLN
100.0000 mg | Freq: Once | INTRAMUSCULAR | Status: AC
  Administered 2013-04-21: 100 mg via INTRAVENOUS
  Filled 2013-04-21: qty 10

## 2013-04-21 MED ORDER — FUROSEMIDE 10 MG/ML IJ SOLN
40.0000 mg | Freq: Once | INTRAMUSCULAR | Status: AC
  Administered 2013-04-21: 40 mg via INTRAVENOUS
  Filled 2013-04-21: qty 4

## 2013-04-21 MED ORDER — METOPROLOL TARTRATE 25 MG PO TABS
25.0000 mg | ORAL_TABLET | Freq: Two times a day (BID) | ORAL | Status: DC
  Administered 2013-04-21 – 2013-04-23 (×3): 25 mg via ORAL
  Filled 2013-04-21 (×5): qty 1

## 2013-04-21 MED ORDER — SODIUM CHLORIDE 0.9 % IJ SOLN
3.0000 mL | Freq: Two times a day (BID) | INTRAMUSCULAR | Status: DC
  Administered 2013-04-21: 3 mL via INTRAVENOUS

## 2013-04-21 MED ORDER — ATORVASTATIN CALCIUM 20 MG PO TABS
20.0000 mg | ORAL_TABLET | Freq: Every morning | ORAL | Status: DC
  Administered 2013-04-22 – 2013-04-25 (×4): 20 mg via ORAL
  Filled 2013-04-21 (×4): qty 1

## 2013-04-21 MED ORDER — HEPARIN SODIUM (PORCINE) 5000 UNIT/ML IJ SOLN
5000.0000 [IU] | Freq: Three times a day (TID) | INTRAMUSCULAR | Status: DC
  Administered 2013-04-21 – 2013-04-25 (×12): 5000 [IU] via SUBCUTANEOUS
  Filled 2013-04-21 (×14): qty 1

## 2013-04-21 MED ORDER — ASPIRIN 81 MG PO TABS: 81.0000 mg | ORAL_TABLET | Freq: Every morning | ORAL | Status: DC

## 2013-04-21 MED ORDER — ONDANSETRON HCL 4 MG/2ML IJ SOLN: 4.0000 mg | Freq: Four times a day (QID) | INTRAMUSCULAR | Status: DC | PRN

## 2013-04-21 MED ORDER — SODIUM CHLORIDE 0.9 % IJ SOLN: 3.0000 mL | INTRAMUSCULAR | Status: DC | PRN

## 2013-04-21 MED ORDER — ASPIRIN EC 81 MG PO TBEC
81.0000 mg | DELAYED_RELEASE_TABLET | Freq: Every day | ORAL | Status: DC
  Administered 2013-04-22 – 2013-04-25 (×4): 81 mg via ORAL
  Filled 2013-04-21 (×4): qty 1

## 2013-04-21 MED ORDER — FUROSEMIDE 10 MG/ML IJ SOLN
80.0000 mg | Freq: Once | INTRAMUSCULAR | Status: AC
  Administered 2013-04-21: 80 mg via INTRAVENOUS
  Filled 2013-04-21: qty 8

## 2013-04-21 MED ORDER — FUROSEMIDE 10 MG/ML IJ SOLN
10.0000 mg/h | INTRAVENOUS | Status: DC
  Administered 2013-04-21 – 2013-04-23 (×2): 10 mg/h via INTRAVENOUS
  Filled 2013-04-21 (×4): qty 25

## 2013-04-21 MED ORDER — AMLODIPINE BESYLATE 10 MG PO TABS
10.0000 mg | ORAL_TABLET | Freq: Every morning | ORAL | Status: DC
  Administered 2013-04-22 – 2013-04-24 (×3): 10 mg via ORAL
  Filled 2013-04-21 (×3): qty 1

## 2013-04-21 MED ORDER — ACETAMINOPHEN 325 MG PO TABS
650.0000 mg | ORAL_TABLET | ORAL | Status: DC | PRN
  Administered 2013-04-24: 325 mg via ORAL
  Administered 2013-04-24 – 2013-04-25 (×2): 650 mg via ORAL
  Filled 2013-04-21: qty 2

## 2013-04-21 MED ORDER — HYDRALAZINE HCL 50 MG PO TABS
100.0000 mg | ORAL_TABLET | Freq: Two times a day (BID) | ORAL | Status: DC
  Administered 2013-04-21 – 2013-04-24 (×5): 100 mg via ORAL
  Filled 2013-04-21 (×7): qty 2

## 2013-04-21 MED ORDER — CLONIDINE HCL 0.3 MG PO TABS
0.3000 mg | ORAL_TABLET | Freq: Two times a day (BID) | ORAL | Status: DC
  Administered 2013-04-21 – 2013-04-24 (×5): 0.3 mg via ORAL
  Filled 2013-04-21 (×7): qty 1

## 2013-04-21 NOTE — H&P (Signed)
[facility] History and Physical  [name] [name] [id number] DOB: 12-Dec-1945 DOA: 04/21/2013  Referring physician: Dr. Modena Nunnery PCP: Benito Mccreedy, MD   Chief Complaint: Shortness of breath  HPI: [name] [name] is a 68 y.o. male  With history of diabetes, hypertension, hyperlipidemia, chronic kidney disease with a fistula that was placed approximately 2 years ago, gallop, gastroesophageal reflux disease, history of prostate cancer status post robotic assisted laparoscopic radical process back to me 05/18/2012 who presents to the ED with a 2 to three-day history of worsening shortness of breath on exertion. Patient stated that shortness of breath has worsened to the point where he had to stop and sit down today. Patient endorses orthopnea, proximal nocturnal dyspnea, lower extremity edema, wheezing. Patient denies any fevers, no chills, no chest pain, no constipation, no diarrhea, no weight gain, no change in chronic dysuria, no nausea, no vomiting, no diaphoresis, no palpitations, no weakness. Patient denies any melena, no hematemesis, no hematochezia. Patient was seen in the ED EKG done had T-wave changes in leads V4 through V6 which were more pronounced than prior EKG. Basic metabolic profile had a bicarbonate of 17 BUN of 58 creatinine of 6.20. First set of troponin was elevated at 0.3 to. Pro BNP was elevated at 10,908. CBC had a white count of 12.7 hemoglobin of 10.0 otherwise was within normal limits. Chest x-ray was consistent with pulmonary edema. Patient was given 80 mg of IV Lasix x1. We were called to admit the patient for further evaluation and management.   Review of Systems: As per history of present illness otherwise negative. Constitutional:  No weight loss, night sweats, Fevers, chills, fatigue.  HEENT:  No headaches, Difficulty swallowing,Tooth/dental problems,Sore throat,  No sneezing, itching, ear ache, nasal congestion, post nasal drip,  Cardio-vascular:  No chest  pain, Orthopnea, PND, swelling in lower extremities, anasarca, dizziness, palpitations  GI:  No heartburn, indigestion, abdominal pain, nausea, vomiting, diarrhea, change in bowel habits, loss of appetite  Resp:  No shortness of breath with exertion or at rest. No excess mucus, no productive cough, No non-productive cough, No coughing up of blood.No change in color of mucus.No wheezing.No chest wall deformity  Skin:  no rash or lesions.  GU:  no dysuria, change in color of urine, no urgency or frequency. No flank pain.  Musculoskeletal:  No joint pain or swelling. No decreased range of motion. No back pain.  Psych:  No change in mood or affect. No depression or anxiety. No memory loss.   Past Medical History  Diagnosis Date  . Diabetes mellitus   . Hyperlipidemia   . Hypertension   . Chronic renal insufficiency   . Joint pain   . Gout   . GERD (gastroesophageal reflux disease)   . Arthritis   . Prostate cancer     adenocarcinoma gleason 7  . Bladder neck contracture   . Elevated prostate specific antigen (PSA)   . Urethral stricture unspecified   . Murmur, cardiac 04/21/2013   Past Surgical History  Procedure Laterality Date  . Appendectomy  69 yrs old    open  . Av fistula placement, brachiocephalic  0000000  . Robot assisted laparoscopic radical prostatectomy N/A 05/18/2012    Procedure: ROBOTIC ASSISTED LAPAROSCOPIC RADICAL PROSTATECTOMY;  Surgeon: Alexis Frock, MD;  Location: [facility];  Service: Urology;  Laterality: N/A;  . Lymphadenectomy Bilateral 05/18/2012    Procedure: LYMPHADENECTOMY;  Surgeon: Alexis Frock, MD;  Location: [facility];  Service: Urology;  Laterality: Bilateral;  . Circumcision, non-newborn    .  Kidney surgery    . Pelvic laparoscopy     Social History:  reports that he has been smoking Cigarettes.  He has a 40 pack-year smoking history. He has never used smokeless tobacco. He reports that he does not drink alcohol or use illicit drugs.  No Known  Allergies  Family History  Problem Relation Age of Onset  . Diabetes Brother   . Kidney disease Brother   . Hypertension Brother   . Stroke Mother   . Hypertension Sister   . Hypertension Brother   . Hypertension Brother   . Hypertension Sister      Prior to Admission medications   Medication Sig Start Date End Date Taking? Authorizing Provider  amLODipine (NORVASC) 10 MG tablet Take 10 mg by mouth every morning.    Yes Historical Provider, MD  aspirin 81 MG tablet Take 81 mg by mouth every morning.    Yes Historical Provider, MD  atorvastatin (LIPITOR) 20 MG tablet Take 20 mg by mouth every morning.   Yes Historical Provider, MD  calcitRIOL (ROCALTROL) 0.25 MCG capsule Take 0.25 mcg by mouth at bedtime.    Yes Historical Provider, MD  cholecalciferol (VITAMIN D) 1000 UNITS tablet Take 1,000 Units by mouth every morning.    Yes Historical Provider, MD  cloNIDine (CATAPRES) 0.3 MG tablet Take 0.3 mg by mouth 2 (two) times daily.    Yes Historical Provider, MD  diclofenac sodium (VOLTAREN) 1 % GEL Apply 4 g topically 4 (four) times daily. On feet for gout   Yes Historical Provider, MD  furosemide (LASIX) 40 MG tablet Take 40 mg by mouth every morning.    Yes Historical Provider, MD  hydrALAZINE (APRESOLINE) 100 MG tablet Take 100 mg by mouth 2 (two) times daily.    Yes Historical Provider, MD  metoprolol tartrate (LOPRESSOR) 25 MG tablet Take 25 mg by mouth 2 (two) times daily.     Yes Historical Provider, MD  OVER THE COUNTER MEDICATION Take 1 tablet by mouth daily as needed (Dietary supplementation). Red vitamin pill: Take in the morning as needed.   Yes Historical Provider, MD  PRESCRIPTION MEDICATION Apply 1 application topically daily as needed (Itching--spots on waist and top of foot). Gel for itching   Yes Historical Provider, MD  tamsulosin (FLOMAX) 0.4 MG CAPS capsule Take 0.4 mg by mouth at bedtime.    Yes Historical Provider, MD   Physical Exam: Filed Vitals:   04/21/13 1730    BP: 136/71  Pulse: 76  Temp:   Resp: 20    BP 136/71  Pulse 76  Temp(Src) 98.3 F (36.8 C) (Oral)  Resp 20  Ht 5\' 7"  (1.702 m)  Wt 78.291 kg (172 lb 9.6 oz)  BMI 27.03 kg/m2  SpO2 97%  General:  Patient sitting bolt upright use of accessory muscles of respiration however speaking in complete sentences. Eyes: PERRLA, EOMI,, normal lids, irises & conjunctiva ENT: grossly normal hearing, lips & tongue Neck: no LAD, masses or thyromegaly, positive JVD Cardiovascular: RRR, with a harsh systolic murmur at the left upper sternal border. Positive JVD. 2+ bilateral lower extremity edema. Respiratory: Bibasilar crackles. Use of accessory muscles of respiration. Thoracoabdominal breathing no wheezing.  Abdomen: soft, ntnd, positive bowel sounds, no rebound, no guarding. Skin: no rash or induration seen on limited exam Musculoskeletal: grossly normal tone BUE/BLE. Left upper extremity with good thrill at site of fistula. Psychiatric: grossly normal mood and affect, speech fluent and appropriate Neurologic: Alert and oriented x3. Cranial nerves  II through XII are grossly intact. Sensation is intact. Visual fields are intact. Gait not tested secondary to safety.           Labs on Admission:  Basic Metabolic Panel:  Recent Labs Lab 04/21/13 1426  NA 143  K 4.6  CL 109  CO2 17*  GLUCOSE 112*  BUN 58*  CREATININE 6.20*  CALCIUM 9.7   Liver Function Tests: No results found for this basename: AST, ALT, ALKPHOS, BILITOT, PROT, ALBUMIN,  in the last 168 hours No results found for this basename: LIPASE, AMYLASE,  in the last 168 hours No results found for this basename: AMMONIA,  in the last 168 hours CBC:  Recent Labs Lab 04/21/13 1426  WBC 12.7*  HGB 10.0*  HCT 29.9*  MCV 95.5  PLT 255   Cardiac Enzymes:  Recent Labs Lab 04/21/13 1613  TROPONINI 0.32*    BNP (last 3 results)  Recent Labs  04/21/13 1426  PROBNP 10908.0*   CBG: No results found for this  basename: GLUCAP,  in the last 168 hours  Radiological Exams on Admission: Dg Chest 2 View  04/21/2013   CLINICAL DATA:  Shortness of breath, history of hypertension  EXAM: CHEST  2 VIEW  COMPARISON:  DG CHEST 2 VIEW dated 05/09/2012  FINDINGS: Mild cardiac enlargement. Severe interstitial opacification in the bilateral central lung zones with vascular congestion and tiny bilateral pleural effusions.  IMPRESSION: Congestive heart failure with severe interstitial pulmonary edema   Electronically Signed   By: Skipper Cliche M.D.   On: 04/21/2013 15:41    EKG: Independently reviewed. NSR with t wave inversion in V4 through V6 more pronounced than prior EKG.  Assessment/Plan Principal Problem:   Acute respiratory failure Active Problems:   Acute exacerbation of CHF (congestive heart failure)   Hyperparathyroidism, secondary   Dyspnea   Elevated troponin   CKD (chronic kidney disease)   Leukocytosis   Anemia   Murmur, cardiac  #1 acute respiratory failure Patient will use of accessory muscles of respiration shortness of breath, orthopnea, paroxysmal nocturnal dyspnea, lower extremity edema, known to have elevated pro BNP and chest x-ray consistent with pulmonary edema. EKG does show more pronounced T wave inversions in leads V4 through V6. First set of troponin of 0.32. Patient does have chronic kidney disease and a fistula placed 2 years ago. Differential includes acute CHF exacerbation versus worsening renal function/end-stage renal disease requiring hemodialysis. Will admit the patient to the step down unit. Cycle cardiac enzymes every 6 hours x3. Check a 2-D echo. Check a TSH. Strict is and os. Daily weights. Place on Lasix 120 mg IV every 8 hours. We'll consult with cardiology and nephrology for further evaluation and management. We'll place on the BiPAP. We'll also consult critical care medicine.  #2 acute CHF exacerbation Patient presented with symptoms consistent with acute CHF  exacerbation including worsening shortness of breath, orthopnea, paroxysmal nocturnal dyspnea, lower extremity edema, positive JVD. With elevated pro BNP. The rest of troponin slightly elevated at 0.32. EKG does have multiple bouts T-wave in lead inversions in leads V4 through V6. Will cycle cardiac enzymes every 6 hours x3. Check a fasting lipid panel. Check a 2-D echo. Check a TSH. Place on Lasix 120 mg IV every 8 hours. Continue home dose aspirin, beta blocker, hydralazine, Lipitor. Strict I./os. Daily weights. Place on the BiPAP. Consult with cardiology for further evaluation and management.  #3 harsh systolic murmur Concern for possible aortic stenosis. Will check a 2-D echo.  #  4 chronic kidney disease Patient with a history of chronic kidney disease. Patient has a fistula in place. Patient presented in volume overload with acute respiratory failure. Concern as to whether patient has progressed to end-stage renal disease where he might need hemodialysis started. We'll consult with nephrology for further evaluation and management.  #5 elevated troponin Questionable etiology. May be secondary to problem #2 versus acute coronary syndrome. We'll continue to cycle serial cardiac enzymes. Check a 2-D echo. Continue aspirin, beta blocker, hydralazine, Lipitor. Follow.  #6 anemia Likely anemia of chronic disease secondary to chronic kidney disease. Check an anemia panel. Follow H&H.  #7 leukocytosis Chest x-ray negative for acute infiltrate. Check a UA with cultures and sensitivities. Follow for now.  #8 prophylaxis PPI for GI prophylaxis. Heparin for DVT prophylaxis.  Code Status: Full Family Communication: Updated patient and sister at bedside. Disposition Plan: Admit to the step down unit.  Time spent: 75 minutes  Eugenie Filler M.D. [facility] Pager [phone]

## 2013-04-21 NOTE — ED Notes (Signed)
Attempted report again. RN is off floor transporting pt, will call back ASAP

## 2013-04-21 NOTE — ED Notes (Signed)
Attempted to call report

## 2013-04-21 NOTE — Consult Note (Signed)
Renal Service Consult Note [facility]  [name] [name] 04/21/2013 [name] [name] Requesting Physician: Dr Grandville Silos    Reason for Consult:  CKD stage V HPI: The patient is a 68 y.o. year-old with hx of DM, HTN, gout, DJD, prostate cancer, CHF and CKD who presented to ED today with SOB increasingly worsening over the past few weeks.  +Orthopnea. Seen in ED where EKG showed some T-wave changes and troponin was 0.32. WBC 12k, Hb 10.0, CXR showing CHF with IS pulm edema.   Patient denies any chest pain, N/V/D, no wt gain, +chronic dysuria, no gen weakness. +LE swelling, wheezing and PND.   He got IV lasix in ED and has voided 3 times, SOB is better, on nasal cannula.     Date    Creat    eGFR 2008-2009 2.47 - 3.02 2010-2011 3.44 - 4.28 2012-2013 3.89 - 4.29 May 2012 5.27 - 7.02  7 - 10 04/21/13 6.20   8  ROS  no rash  no HA  no CP  no jt pain  Past Medical History  Past Medical History  Diagnosis Date  . Diabetes mellitus   . Hyperlipidemia   . Hypertension   . Chronic renal insufficiency   . Joint pain   . Gout   . GERD (gastroesophageal reflux disease)   . Arthritis   . Prostate cancer     adenocarcinoma gleason 7  . Bladder neck contracture   . Elevated prostate specific antigen (PSA)   . Urethral stricture unspecified   . Murmur, cardiac 04/21/2013  . CHF (congestive heart failure)   . Shortness of breath    Past Surgical History  Past Surgical History  Procedure Laterality Date  . Appendectomy  68 yrs old    open  . Av fistula placement, brachiocephalic  [id number]  . Robot assisted laparoscopic radical prostatectomy N/A 05/18/2012    Procedure: ROBOTIC ASSISTED LAPAROSCOPIC RADICAL PROSTATECTOMY;  Surgeon: Alexis Frock, MD;  Location: [facility];  Service: Urology;  Laterality: N/A;  . Lymphadenectomy Bilateral 05/18/2012    Procedure: LYMPHADENECTOMY;  Surgeon: Alexis Frock, MD;  Location: [facility];  Service: Urology;  Laterality: Bilateral;  .  Circumcision, non-newborn    . Kidney surgery    . Pelvic laparoscopy     Family History  Family History  Problem Relation Age of Onset  . Diabetes Brother   . Kidney disease Brother   . Hypertension Brother   . Stroke Mother   . Hypertension Sister   . Hypertension Brother   . Hypertension Brother   . Hypertension Sister    Social History  reports that he has been smoking Cigarettes.  He has a 40 pack-year smoking history. He has never used smokeless tobacco. He reports that he does not drink alcohol or use illicit drugs. Allergies No Known Allergies Home medications Prior to Admission medications   Medication Sig Start Date End Date Taking? Authorizing Provider  amLODipine (NORVASC) 10 MG tablet Take 10 mg by mouth every morning.    Yes Historical Provider, MD  aspirin 81 MG tablet Take 81 mg by mouth every morning.    Yes Historical Provider, MD  atorvastatin (LIPITOR) 20 MG tablet Take 20 mg by mouth every morning.   Yes Historical Provider, MD  calcitRIOL (ROCALTROL) 0.25 MCG capsule Take 0.25 mcg by mouth at bedtime.    Yes Historical Provider, MD  cholecalciferol (VITAMIN D) 1000 UNITS tablet Take 1,000 Units by mouth every morning.  Yes Historical Provider, MD  cloNIDine (CATAPRES) 0.3 MG tablet Take 0.3 mg by mouth 2 (two) times daily.    Yes Historical Provider, MD  diclofenac sodium (VOLTAREN) 1 % GEL Apply 4 g topically 4 (four) times daily. On feet for gout   Yes Historical Provider, MD  furosemide (LASIX) 40 MG tablet Take 40 mg by mouth every morning.    Yes Historical Provider, MD  hydrALAZINE (APRESOLINE) 100 MG tablet Take 100 mg by mouth 2 (two) times daily.    Yes Historical Provider, MD  metoprolol tartrate (LOPRESSOR) 25 MG tablet Take 25 mg by mouth 2 (two) times daily.     Yes Historical Provider, MD  OVER THE COUNTER MEDICATION Take 1 tablet by mouth daily as needed (Dietary supplementation). Red vitamin pill: Take in the morning as needed.   Yes Historical  Provider, MD  PRESCRIPTION MEDICATION Apply 1 application topically daily as needed (Itching--spots on waist and top of foot). Gel for itching   Yes Historical Provider, MD  tamsulosin (FLOMAX) 0.4 MG CAPS capsule Take 0.4 mg by mouth at bedtime.    Yes Historical Provider, MD   Liver Function Tests No results found for this basename: AST, ALT, ALKPHOS, BILITOT, PROT, ALBUMIN,  in the last 168 hours No results found for this basename: LIPASE, AMYLASE,  in the last 168 hours CBC  Recent Labs Lab 04/21/13 1426  WBC 12.7*  HGB 10.0*  HCT 29.9*  MCV 95.5  PLT 191   Basic Metabolic Panel  Recent Labs Lab 04/21/13 1426  NA 143  K 4.6  CL 109  CO2 17*  GLUCOSE 112*  BUN 58*  CREATININE 6.20*  CALCIUM 9.7    Filed Vitals:   04/21/13 1830 04/21/13 1900 04/21/13 1930 04/21/13 1938  BP: 171/75 157/78 151/80 151/80  Pulse: 76 88 76 77  Temp: 98 F (36.7 C)     TempSrc: Oral     Resp: 20 23 30 23   Height: 5' 7"  (1.702 m)     Weight: 75.8 kg (167 lb 1.7 oz)     SpO2: 99% 97% 100% 100%   Exam Alert, looks fatigued, dyspneic wild minimal exertion, not in severe distress No rash, cyanosis or gangrene Sclera anicteric, throat clear +JVD Bilateral rales 1/3 up post lung fields RRR transmitted bruit from L arm AVF, no rub Abd soft, NTND, no mass or ascites GU nl genitalia No leg or UE edema LUA AVF strong bruit and mature on exam Neuro is nf, no asterixis  CXR - pulm edema moderate severity  Assessment: 1 Dyspnea / acute pulm edema / volume excess 2 CKD stage V- uremic 3 DM2 4 HTN on 4 BP meds 5 Anemia Hb 10.0 6 Prostate cancer s/p surgery   Plan- agree with high dose IV lasix, fluid restriction, renal diet, BP meds.  Needs to start dialysis. Pt agreeable, orders written for first HD tomorrow. AVF looks good.  Decrease volume with HD / diuresis.   Kelly Splinter MD (pgr) [phone]    ([phone] 04/21/2013, 8:23 PM

## 2013-04-21 NOTE — ED Notes (Signed)
Pt reports sob increasingly worse over past few weeks. Reports since Friday he can not lie down to sleep or do any activity without getting SOB. He is labored at rest now. No pain

## 2013-04-21 NOTE — Progress Notes (Signed)
Rt placed pt on bipap as order, but rn reported dr only wanted bipap as needed.  RT removed machine and placed pt back on o2 at this time.. RT will continue to monitor.  Bipap on standby at this time.

## 2013-04-21 NOTE — ED Notes (Signed)
Admitting physician requesting pt not be transferred until he comes down to assess the pt

## 2013-04-21 NOTE — ED Provider Notes (Signed)
CSN: [id number]     Arrival date & time 04/21/13  1350 History   First MD Initiated Contact with Patient 04/21/13 1500     Chief Complaint  Patient presents with  . Shortness of Breath     (Consider location/radiation/quality/duration/timing/severity/associated sxs/prior Treatment) The history is provided by the patient.   history of present illness: 68 yo male who presents with chief complaint of shortness of breath. He reports progressively worsening shortness of breath over the course of the past 2 months. Symptoms have been acutely worse for the past 2 days. Started with shortness of breath with exertion that has now progressed to an inability to recline even at an angle. Symptoms constant now described as severe. He denies any fevers, cough, chest pain. He has had associated worsening lower extremity edema. Patient had dialysis fistula placed 2 years ago. It has not yet been accessed. He reports being arranged for an upcoming cardiac workup but has no known history of congestive heart failure.  Past Medical History  Diagnosis Date  . Diabetes mellitus   . Hyperlipidemia   . Hypertension   . Chronic renal insufficiency   . Joint pain   . Gout   . GERD (gastroesophageal reflux disease)   . Arthritis   . Prostate cancer     adenocarcinoma gleason 7  . Bladder neck contracture   . Elevated prostate specific antigen (PSA)   . Urethral stricture unspecified   . Murmur, cardiac 04/21/2013  . CHF (congestive heart failure)   . Shortness of breath    Past Surgical History  Procedure Laterality Date  . Appendectomy  68 yrs old    open  . Av fistula placement, brachiocephalic  [id number]  . Robot assisted laparoscopic radical prostatectomy N/A 05/18/2012    Procedure: ROBOTIC ASSISTED LAPAROSCOPIC RADICAL PROSTATECTOMY;  Surgeon: Alexis Frock, MD;  Location: [facility];  Service: Urology;  Laterality: N/A;  . Lymphadenectomy Bilateral 05/18/2012    Procedure: LYMPHADENECTOMY;  Surgeon: Alexis Frock, MD;  Location: [facility];  Service: Urology;  Laterality: Bilateral;  . Circumcision, non-newborn    . Kidney surgery    . Pelvic laparoscopy     Family History  Problem Relation Age of Onset  . Diabetes Brother   . Kidney disease Brother   . Hypertension Brother   . Stroke Mother   . Hypertension Sister   . Hypertension Brother   . Hypertension Brother   . Hypertension Sister    History  Substance Use Topics  . Smoking status: Current Every Day Smoker -- 1.00 packs/day for 40 years    Types: Cigarettes  . Smokeless tobacco: Never Used     Comment: reports smoking approximately 15 cigarettes per day  . Alcohol Use: No    Review of Systems  Constitutional: Negative for fever and chills.  HENT: Negative.   Eyes: Negative.   Respiratory: Positive for shortness of breath. Negative for cough.   Cardiovascular: Positive for leg swelling. Negative for chest pain and palpitations.  Gastrointestinal: Negative for nausea, vomiting, abdominal pain, diarrhea and constipation.  Genitourinary: Negative.   Musculoskeletal: Negative.   Skin: Negative.   Neurological: Negative.   All other systems reviewed and are negative.     Allergies  Review of patient's allergies indicates no known allergies.  Home Medications   No current outpatient prescriptions on file. BP 172/98  Pulse 76  Temp(Src) 98 F (36.7 C) (Oral)  Resp 23  Ht 5\' 7"  (1.702 m)  Wt 167 lb 1.7  oz (75.8 kg)  BMI 26.17 kg/m2  SpO2 96% Physical Exam  Nursing note and vitals reviewed. Constitutional: He is oriented to person, place, and time. He appears well-developed and well-nourished. No distress.  HENT:  Head: Normocephalic and atraumatic.  Eyes: Conjunctivae are normal.  Neck: Neck supple.  Cardiovascular: Normal rate, regular rhythm and intact distal pulses.   Murmur heard.  Systolic murmur is present with a grade of 4/6  Pulmonary/Chest: Accessory muscle usage present. Tachypnea noted. He has rales  (diffuse bilaterally).  Abdominal: Soft. He exhibits no distension. There is no tenderness.  Musculoskeletal: Normal range of motion.  Neurological: He is alert and oriented to person, place, and time.  Skin: Skin is warm and dry.    ED Course  Procedures (including critical care time) Labs Review Labs Reviewed  CBC - Abnormal; Notable for the following:    WBC 12.7 (*)    RBC 3.13 (*)    Hemoglobin 10.0 (*)    HCT 29.9 (*)    All other components within normal limits  BASIC METABOLIC PANEL - Abnormal; Notable for the following:    CO2 17 (*)    Glucose, Bld 112 (*)    BUN 58 (*)    Creatinine, Ser 6.20 (*)    GFR calc non Af Amer 8 (*)    GFR calc Af Amer 10 (*)    All other components within normal limits  PRO B NATRIURETIC PEPTIDE - Abnormal; Notable for the following:    Pro B Natriuretic peptide (BNP) 10908.0 (*)    All other components within normal limits  TROPONIN I - Abnormal; Notable for the following:    Troponin I 0.32 (*)    All other components within normal limits  URINALYSIS, ROUTINE W REFLEX MICROSCOPIC - Abnormal; Notable for the following:    Protein, ur 100 (*)    All other components within normal limits  COMPREHENSIVE METABOLIC PANEL - Abnormal; Notable for the following:    CO2 16 (*)    Glucose, Bld 136 (*)    BUN 59 (*)    Creatinine, Ser 6.20 (*)    Albumin 3.3 (*)    GFR calc non Af Amer 8 (*)    GFR calc Af Amer 10 (*)    All other components within normal limits  I-STAT TROPOININ, ED - Abnormal; Notable for the following:    Troponin i, poc 0.13 (*)    All other components within normal limits  I-STAT ARTERIAL BLOOD GAS, ED - Abnormal; Notable for the following:    pCO2 arterial 27.1 (*)    pO2, Arterial 63.0 (*)    Bicarbonate 16.6 (*)    Acid-base deficit 7.0 (*)    All other components within normal limits  MRSA PCR SCREENING  URINE CULTURE  MAGNESIUM  TSH  TROPONIN I  URINE MICROSCOPIC-ADD ON  VITAMIN B12  FOLATE  IRON AND  TIBC  FERRITIN  LIPID PANEL  BASIC METABOLIC PANEL  PRO B NATRIURETIC PEPTIDE  TROPONIN I  TROPONIN I  HEPATITIS B SURFACE ANTIGEN  HEPATITIS B SURFACE ANTIBODY  HEPATITIS B CORE ANTIBODY, IGM  LIPID PANEL   Imaging Review Dg Chest 2 View  04/21/2013   CLINICAL DATA:  Shortness of breath, history of hypertension  EXAM: CHEST  2 VIEW  COMPARISON:  DG CHEST 2 VIEW dated 05/09/2012  FINDINGS: Mild cardiac enlargement. Severe interstitial opacification in the bilateral central lung zones with vascular congestion and tiny bilateral pleural effusions.  IMPRESSION: Congestive  heart failure with severe interstitial pulmonary edema   Electronically Signed   By: Skipper Cliche M.D.   On: 04/21/2013 15:41     EKG Interpretation   Date/Time:  Sunday April 21 2013 14:01:14 EDT Ventricular Rate:  81 PR Interval:  184 QRS Duration: 98 QT Interval:  372 QTC Calculation: 432 R Axis:   97 Text Interpretation:  Normal sinus rhythm Possible Left atrial enlargement  Rightward axis T wave abnormality, consider lateral ischemia Abnormal ECG  Confirmed by Christy Gentles  MD, Elenore Rota ([id number]) on 04/21/2013 2:15:07 PM      MDM   Final diagnoses:  Chronic renal failure  Dyspnea    [name] [name] is a 68 y.o. male with PMSH of hypertension, hyperlipidemia, chronic renal insufficiency here with 2 days of acutely worsening shortness of breath and orthopnea.  Concern for acute on chronic renal failure, new onset heart failure. No signs of infectious etiology.  Labs/Imaging: CBC, BMP, BNP, Trop, CXR.   Date: 04/21/2013  Rate: 81  Rhythm: normal sinus rhythm  QRS Axis: normal  Intervals: normal  ST/T Wave abnormalities: nonspecific T wave changes  Conduction Disutrbances:none  Narrative Interpretation: Nonspecific T wave changes inferolaterally unchanged from prior EKG  Old EKG Reviewed: unchanged   CXR with diffuse infiltrate bilaterally.  BNP 10,900.  Giving Lasix 80 mg IV.  Hospitalist consult and  will admit for further management.   Renaldo Reel, MD 04/21/13 2337

## 2013-04-21 NOTE — Consult Note (Signed)
Reason for Consult: elevation troponin, BNP, concern for HF Referring Physician: Dr. [name] is an 68 y.o. male.  HPI: [name] [name] is a 68 yo man with PMH of T2DM, hypertension, dylslipidemia, chronic kidney disease with fistula placed 2 years ago (not on dialysis yet), who still makes urine, GERD, prostate cancer who presents with several days of shortness of breath with exertion requiring multiple stops. He has associated PND and orthopnea with lower extremity edema. No infectious symptoms of fever/chills/nausea/vomiting/diarrhea. He walks frequently with his sister for 1-1.5 miles without difficulty or SOB. Cardiology consulted for mild elevation in troponin, significant BNP elevation and pulmonary edema on chest x-ray. 80+40 mg IV lasix give in the ER.      Past Medical History  Diagnosis Date  . Diabetes mellitus   . Hyperlipidemia   . Hypertension   . Chronic renal insufficiency   . Joint pain   . Gout   . GERD (gastroesophageal reflux disease)   . Arthritis   . Prostate cancer     adenocarcinoma gleason 7  . Bladder neck contracture   . Elevated prostate specific antigen (PSA)   . Urethral stricture unspecified   . Murmur, cardiac 04/21/2013    Past Surgical History  Procedure Laterality Date  . Appendectomy  68 yrs old    open  . Av fistula placement, brachiocephalic  [id number]  . Robot assisted laparoscopic radical prostatectomy N/A 05/18/2012    Procedure: ROBOTIC ASSISTED LAPAROSCOPIC RADICAL PROSTATECTOMY;  Surgeon: Alexis Frock, MD;  Location: [facility];  Service: Urology;  Laterality: N/A;  . Lymphadenectomy Bilateral 05/18/2012    Procedure: LYMPHADENECTOMY;  Surgeon: Alexis Frock, MD;  Location: [facility];  Service: Urology;  Laterality: Bilateral;  . Circumcision, non-newborn    . Kidney surgery    . Pelvic laparoscopy      Family History  Problem Relation Age of Onset  . Diabetes Brother   . Kidney disease Brother   . Hypertension Brother   .  Stroke Mother   . Hypertension Sister   . Hypertension Brother   . Hypertension Brother   . Hypertension Sister     Social History:  reports that he has been smoking Cigarettes.  He has a 40 pack-year smoking history. He has never used smokeless tobacco. He reports that he does not drink alcohol or use illicit drugs.  Allergies: No Known Allergies  Medications:  I have reviewed the patient's current medications. Prior to Admission:  Prescriptions prior to admission  Medication Sig Dispense Refill  . amLODipine (NORVASC) 10 MG tablet Take 10 mg by mouth every morning.       Marland Kitchen aspirin 81 MG tablet Take 81 mg by mouth every morning.       Marland Kitchen atorvastatin (LIPITOR) 20 MG tablet Take 20 mg by mouth every morning.      . calcitRIOL (ROCALTROL) 0.25 MCG capsule Take 0.25 mcg by mouth at bedtime.       . cholecalciferol (VITAMIN D) 1000 UNITS tablet Take 1,000 Units by mouth every morning.       . cloNIDine (CATAPRES) 0.3 MG tablet Take 0.3 mg by mouth 2 (two) times daily.       . diclofenac sodium (VOLTAREN) 1 % GEL Apply 4 g topically 4 (four) times daily. On feet for gout      . furosemide (LASIX) 40 MG tablet Take 40 mg by mouth every morning.       . hydrALAZINE (APRESOLINE) 100 MG tablet  Take 100 mg by mouth 2 (two) times daily.       . metoprolol tartrate (LOPRESSOR) 25 MG tablet Take 25 mg by mouth 2 (two) times daily.        Marland Kitchen OVER THE COUNTER MEDICATION Take 1 tablet by mouth daily as needed (Dietary supplementation). Red vitamin pill: Take in the morning as needed.      Marland Kitchen PRESCRIPTION MEDICATION Apply 1 application topically daily as needed (Itching--spots on waist and top of foot). Gel for itching      . tamsulosin (FLOMAX) 0.4 MG CAPS capsule Take 0.4 mg by mouth at bedtime.        Scheduled: . [START ON 04/22/2013] amLODipine  10 mg Oral q morning - 10a  . [START ON 04/22/2013] aspirin EC  81 mg Oral Daily  . [START ON 04/22/2013] atorvastatin  20 mg Oral q morning - 10a  .  calcitRIOL  0.25 mcg Oral QHS  . cloNIDine  0.3 mg Oral BID  . [START ON 04/22/2013] furosemide  120 mg Intravenous 3 times per day  . heparin  5,000 Units Subcutaneous 3 times per day  . hydrALAZINE  100 mg Oral BID  . metoprolol tartrate  25 mg Oral BID  . [START ON 04/22/2013] pantoprazole  40 mg Oral Q0600  . sodium chloride  3 mL Intravenous Q12H  . tamsulosin  0.4 mg Oral QHS   Continuous:   Results for orders placed during the hospital encounter of 04/21/13 (from the past 48 hour(s))  CBC     Status: Abnormal   Collection Time    04/21/13  2:26 PM      Result Value Ref Range   WBC 12.7 (*) 4.0 - 10.5 K/uL   RBC 3.13 (*) 4.22 - 5.81 MIL/uL   Hemoglobin 10.0 (*) 13.0 - 17.0 g/dL   HCT 29.9 (*) 39.0 - 52.0 %   MCV 95.5  78.0 - 100.0 fL   MCH 31.9  26.0 - 34.0 pg   MCHC 33.4  30.0 - 36.0 g/dL   RDW 13.7  11.5 - 15.5 %   Platelets 255  150 - 400 K/uL  BASIC METABOLIC PANEL     Status: Abnormal   Collection Time    04/21/13  2:26 PM      Result Value Ref Range   Sodium 143  137 - 147 mEq/L   Potassium 4.6  3.7 - 5.3 mEq/L   Chloride 109  96 - 112 mEq/L   CO2 17 (*) 19 - 32 mEq/L   Glucose, Bld 112 (*) 70 - 99 mg/dL   BUN 58 (*) 6 - 23 mg/dL   Creatinine, Ser 6.20 (*) 0.50 - 1.35 mg/dL   Calcium 9.7  8.4 - 10.5 mg/dL   GFR calc non Af Amer 8 (*) >90 mL/min   GFR calc Af Amer 10 (*) >90 mL/min   Comment: (NOTE)     The eGFR has been calculated using the CKD EPI equation.     This calculation has not been validated in all clinical situations.     eGFR's persistently <90 mL/min signify possible Chronic Kidney     Disease.  PRO B NATRIURETIC PEPTIDE     Status: Abnormal   Collection Time    04/21/13  2:26 PM      Result Value Ref Range   Pro B Natriuretic peptide (BNP) 10908.0 (*) 0 - 125 pg/mL  Randolm Idol, ED     Status: Abnormal   Collection  Time    04/21/13  4:00 PM      Result Value Ref Range   Troponin i, poc 0.13 (*) 0.00 - 0.08 ng/mL   Comment NOTIFIED  PHYSICIAN     Comment 3            Comment: Due to the release kinetics of cTnI,     a negative result within the first hours     of the onset of symptoms does not rule out     myocardial infarction with certainty.     If myocardial infarction is still suspected,     repeat the test at appropriate intervals.  TROPONIN I     Status: Abnormal   Collection Time    04/21/13  4:13 PM      Result Value Ref Range   Troponin I 0.32 (*) <0.30 ng/mL   Comment:            Due to the release kinetics of cTnI,     a negative result within the first hours     of the onset of symptoms does not rule out     myocardial infarction with certainty.     If myocardial infarction is still suspected,     repeat the test at appropriate intervals.     CRITICAL RESULT CALLED TO, READ BACK BY AND VERIFIED WITH:     BULLOCK,N RN 04/21/13 1705 [place]  I-STAT ARTERIAL BLOOD GAS, ED     Status: Abnormal   Collection Time    04/21/13  6:02 PM      Result Value Ref Range   pH, Arterial 7.396  7.350 - 7.450   pCO2 arterial 27.1 (*) 35.0 - 45.0 mmHg   pO2, Arterial 63.0 (*) 80.0 - 100.0 mmHg   Bicarbonate 16.6 (*) 20.0 - 24.0 mEq/L   TCO2 17  0 - 100 mmol/L   O2 Saturation 92.0     Acid-base deficit 7.0 (*) 0.0 - 2.0 mmol/L   Patient temperature 98.6 F     Collection site RADIAL, ALLEN'S TEST ACCEPTABLE     Drawn by Operator     Sample type ARTERIAL      Dg Chest 2 View  04/21/2013   CLINICAL DATA:  Shortness of breath, history of hypertension  EXAM: CHEST  2 VIEW  COMPARISON:  DG CHEST 2 VIEW dated 05/09/2012  FINDINGS: Mild cardiac enlargement. Severe interstitial opacification in the bilateral central lung zones with vascular congestion and tiny bilateral pleural effusions.  IMPRESSION: Congestive heart failure with severe interstitial pulmonary edema   Electronically Signed   By: Skipper Cliche M.D.   On: 04/21/2013 15:41    Review of Systems  Constitutional: Positive for malaise/fatigue. Negative for  fever, chills and weight loss.  HENT: Negative for ear pain.   Eyes: Negative for double vision and redness.  Respiratory: Positive for shortness of breath. Negative for hemoptysis.   Cardiovascular: Negative for chest pain and claudication.  Gastrointestinal: Negative for nausea and vomiting.  Genitourinary: Negative for dysuria and hematuria.  Musculoskeletal: Negative for myalgias and neck pain.  Skin: Negative for rash.  Neurological: Negative for tingling, tremors, sensory change and headaches.  Endo/Heme/Allergies: Negative for polydipsia.  Psychiatric/Behavioral: Negative for suicidal ideas, hallucinations and substance abuse.   Blood pressure 136/71, pulse 76, temperature 98.3 F (36.8 C), temperature source Oral, resp. rate 20, height _0  (1.702 m), weight 78.291 kg (172 lb 9.6 oz), SpO2 97.00%. Physical Exam  Nursing note and vitals reviewed.  Constitutional: He is oriented to person, place, and time. He appears well-developed and well-nourished. No distress.  HENT:  Head: Normocephalic and atraumatic.  Nose: Nose normal.  Mouth/Throat: Oropharynx is clear and moist. No oropharyngeal exudate.  Eyes: Conjunctivae and EOM are normal. Pupils are equal, round, and reactive to light. No scleral icterus.  Neck: Normal range of motion. Neck supple. JVD present. No tracheal deviation present.  Cardiovascular: Normal rate, regular rhythm and intact distal pulses.   Murmur heard. Respiratory: He is in respiratory distress. He has wheezes. He has rales.  Scattered wheezes and rales at bases 1/2 way up bilaterally  GI: Soft. Bowel sounds are normal. He exhibits no distension. There is no tenderness. There is no rebound.  Musculoskeletal: Normal range of motion. He exhibits edema.  Neurological: He is alert and oriented to person, place, and time. No cranial nerve deficit. Coordination normal.  Skin: Skin is warm and dry. No rash noted. He is not diaphoretic. No erythema.  Psychiatric:  He has a normal mood and affect. His behavior is normal. Thought content normal.   Labs reviewed; na 143, K 4.6, bun/cr 58/6.2, abg 7.39/27/63  Wbc 12.7, h/h 10/30, plt 250s Chest x-ray: bilateral pulmonary edema ECG: SR, HR 80s, diffuse V3-V6, lateral ST depressions/twi Trop 0.13 and 0.32 ProBNP 10,900  Problem List Acute volume overload Chronic renal failure - near end state renal failure Troponinemia Pulmonary edema Systolic murmur at apex and mid-chest Elevated BNP Hypertension Dyslipidemia T2DM Leukocytosis Anemia  Assessment/Plan: [name] is a 68 yo man with T2DM, HTN, HLD, CKD stage V/VI here with pulmonary edema. Differential diagnosis for his pulmonary edema is acute on chronic renal failure, flash pulmonary edema, myocardial infarction, acute heart failure. I favor a diagnosis of progressive kidney disease. I interpret his low level of elevated troponin in the sitting of significant CKD and BNP as evidence of supply/demand mismatch with underlying coronary artery disease very possible but not culprit. At some point functional test vs. cardiac catheterization to delineate coronary anatomy is warranted. In the acute setting, I agree with aggressive IV diuresis (120 mg IV lasix given in total thus far), start lasix gtt and if no response, then hemodialysis initiation should be strongly considered. Will trend troponins for now and if significant rise and/or fall then initiation of heparin is warranted but would not start heparin for this low level troponin for now. He has a systolic murmur at apex c/w mitral regurgitation that could be functional so echocardiogram will be helpful. He's currently hemodynamically stable so will also watch closely.  - stepdown/ICU admission with telemetry - aggressive diuresis; IV lasix 120 mg, would start lasix gtt at 10 mg/hr and titrate up as necessary - trend cardiac markers (large aspirin given) - renal consult to help in determination of  initiation of dialysis  - echocardiogram in AM to evaluate LV function and presence of MVR - consider functional test vs cardiac catheterization once acute issues resolved (unless overt NSTEMI occurs and is felt to be significantly contributory)  Jules Husbands 04/21/2013, 6:32 PM

## 2013-04-21 NOTE — ED Notes (Signed)
Pt denies CP, continues to c/o sob but sts much better than when he arrived.

## 2013-04-21 NOTE — ED Notes (Signed)
Dr. Thompson at bedside. 

## 2013-04-21 NOTE — ED Provider Notes (Signed)
I saw and evaluated the patient, reviewed the resident's note and I agree with the findings and plan.   EKG Interpretation   Date/Time:  Sunday April 21 2013 14:01:14 EDT Ventricular Rate:  81 PR Interval:  184 QRS Duration: 98 QT Interval:  372 QTC Calculation: 432 R Axis:   97 Text Interpretation:  Normal sinus rhythm Possible Left atrial enlargement  Rightward axis T wave abnormality, consider lateral ischemia Abnormal ECG  Confirmed by Christy Gentles  MD, Elenore Rota ([id number]) on 04/21/2013 2:15:07 PM      Pt presents with worsening dyspnea, history of chronic renal failure not on dialysis.  CXR consistent with worsening CHF.  Plan on admission, IV diuretics.  Does not appear to be in distress and require Bipap at this time.  Ultimately would benefit from Renal and cardiology consultation.    Kathalene Frames, MD 04/21/13 [phone]

## 2013-04-22 DIAGNOSIS — D649 Anemia, unspecified: Secondary | ICD-10-CM

## 2013-04-22 DIAGNOSIS — I059 Rheumatic mitral valve disease, unspecified: Secondary | ICD-10-CM

## 2013-04-22 DIAGNOSIS — R011 Cardiac murmur, unspecified: Secondary | ICD-10-CM

## 2013-04-22 LAB — BASIC METABOLIC PANEL
BUN: 62 mg/dL — ABNORMAL HIGH (ref 6–23)
CALCIUM: 9.6 mg/dL (ref 8.4–10.5)
CO2: 15 meq/L — AB (ref 19–32)
Chloride: 111 mEq/L (ref 96–112)
Creatinine, Ser: 6.51 mg/dL — ABNORMAL HIGH (ref 0.50–1.35)
GFR calc Af Amer: 9 mL/min — ABNORMAL LOW (ref >90)
GFR calc non Af Amer: 8 mL/min — ABNORMAL LOW (ref >90)
Glucose, Bld: 96 mg/dL (ref 70–99)
POTASSIUM: 4.4 meq/L (ref 3.7–5.3)
SODIUM: 146 meq/L (ref 137–147)

## 2013-04-22 LAB — FERRITIN: FERRITIN: 189 ng/mL (ref 22–322)

## 2013-04-22 LAB — LIPID PANEL
Cholesterol: 87 mg/dL (ref 0–200)
HDL: 31 mg/dL — AB (ref >39)
LDL Cholesterol: 41 mg/dL (ref 0–99)
TRIGLYCERIDES: 76 mg/dL (ref <150)
Total CHOL/HDL Ratio: 2.8 RATIO
VLDL: 15 mg/dL (ref 0–40)

## 2013-04-22 LAB — PHOSPHORUS
PHOSPHORUS: 4.1 mg/dL (ref 2.3–4.6)
Phosphorus: 4.2 mg/dL (ref 2.3–4.6)

## 2013-04-22 LAB — HEPATITIS B SURFACE ANTIBODY,QUALITATIVE: HEP B S AB: NEGATIVE

## 2013-04-22 LAB — URINE CULTURE
COLONY COUNT: NO GROWTH
CULTURE: NO GROWTH

## 2013-04-22 LAB — HEPATITIS B SURFACE ANTIGEN: Hepatitis B Surface Ag: NEGATIVE

## 2013-04-22 LAB — PRO B NATRIURETIC PEPTIDE: PRO B NATRI PEPTIDE: 11525 pg/mL — AB (ref 0–125)

## 2013-04-22 LAB — FOLATE: Folate: >20 ng/mL

## 2013-04-22 LAB — TROPONIN I: Troponin I: <0.3 ng/mL (ref <0.30)

## 2013-04-22 LAB — VITAMIN B12: Vitamin B-12: 655 pg/mL (ref 211–911)

## 2013-04-22 LAB — HEPATITIS B CORE ANTIBODY, IGM: Hep B C IgM: NONREACTIVE

## 2013-04-22 LAB — GLUCOSE, CAPILLARY: GLUCOSE-CAPILLARY: 116 mg/dL — AB (ref 70–99)

## 2013-04-22 LAB — IRON AND TIBC
Iron: 32 ug/dL — ABNORMAL LOW (ref 42–135)
Saturation Ratios: 15 % — ABNORMAL LOW (ref 20–55)
TIBC: 210 ug/dL — AB (ref 215–435)
UIBC: 178 ug/dL (ref 125–400)

## 2013-04-22 MED ORDER — HYDRALAZINE HCL 20 MG/ML IJ SOLN
10.0000 mg | Freq: Once | INTRAMUSCULAR | Status: AC
  Administered 2013-04-22: 10 mg via INTRAVENOUS

## 2013-04-22 MED ORDER — PNEUMOCOCCAL VAC POLYVALENT 25 MCG/0.5ML IJ INJ
0.5000 mL | INJECTION | INTRAMUSCULAR | Status: AC
  Administered 2013-04-23: 0.5 mL via INTRAMUSCULAR
  Filled 2013-04-22: qty 0.5

## 2013-04-22 MED ORDER — INSULIN ASPART 100 UNIT/ML ~~LOC~~ SOLN
0.0000 [IU] | Freq: Three times a day (TID) | SUBCUTANEOUS | Status: DC
  Administered 2013-04-23 (×2): 1 [IU] via SUBCUTANEOUS
  Administered 2013-04-24: 2 [IU] via SUBCUTANEOUS
  Administered 2013-04-24 – 2013-04-25 (×2): 1 [IU] via SUBCUTANEOUS

## 2013-04-22 MED ORDER — HYDRALAZINE HCL 20 MG/ML IJ SOLN
10.0000 mg | Freq: Four times a day (QID) | INTRAMUSCULAR | Status: DC | PRN
  Administered 2013-04-22 – 2013-04-23 (×2): 10 mg via INTRAVENOUS
  Filled 2013-04-22 (×2): qty 1

## 2013-04-22 NOTE — Progress Notes (Signed)
  Echocardiogram 2D Echocardiogram has been performed.  Valinda Hoar 04/22/2013, 3:25 PM

## 2013-04-22 NOTE — Progress Notes (Signed)
[facility] TEAM 1 - Stepdown/ICU TEAM Progress Note  [name] [name] HYQ:[id number] DOB: 21-Jun-1945 DOA: 04/21/2013 PCP: Benito Mccreedy, MD  Admit HPI / Brief Narrative: 68 y.o. M with history of diabetes, hypertension, hyperlipidemia, chronic kidney disease with a fistula that was placed approximately 2 years ago, gastroesophageal reflux disease, prostate cancer status post robotic assisted laparoscopic radical resection 05/18/2012 who presented to the ED with a 2-3 day history of worsening shortness of breath on exertion. Patient was seen in the ED EKG done had T-wave changes in leads V4 through V6 which were more pronounced than prior EKG. Basic metabolic profile had a bicarbonate of 17 BUN of 58 creatinine of 6.20. First set of troponin was elevated at 0.3. CBC had a white count of 12.7 hemoglobin of 10.0 otherwise was within normal limits. Chest x-ray was consistent with pulmonary edema.  HPI/Subjective: Pt is feeling much better today.  SOB has improved markedly.  Denies cp, f/c, n/v, or abdom pain.    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 Nephrology following - to begin HD today   Elevated troponin Likely stress induced in setting of severe volume overload and kidney failure - Cards has seen   Anemia of chronic kidney disease Care per Nephrology   HTN Poorly controlled - for HD today - no med adjustments until after HD  DM Does not appear to be on meds as outpt - check A1c - order CBG checks   Code Status: FULL Family Communication: spoke w/ pt and family at bedside Disposition Plan: SDU - possible transfer to Nephrology floor 4/14  Consultants: Cardiology Nephrology  Procedures: TTE - 4/12 -  pending   Antibiotics: none  DVT prophylaxis: SQ heparin   Objective: Blood pressure 160/69, pulse 93, temperature 97.9 F (36.6 C), temperature source Oral, resp. rate 18, height 5\' 7"  (1.702 m), weight 75.116 kg (165 lb 9.6 oz), SpO2 99.00%.  Intake/Output Summary (Last 24 hours) at 04/22/13 1302 Last data filed at 04/22/13 1200  Gross per 24 hour  Intake 566.17 ml  Output   3025 ml  Net -2458.83 ml   Exam: General: No acute respiratory distress sitting in bed  Lungs: mild bibasilar crackles - no wheeze  Cardiovascular: Regular rate and rhythm without gallop or rub - 2/6 holosystolic M  Abdomen: Nontender, nondistended, soft, bowel sounds positive, no rebound, no ascites, no appreciable mass Extremities: No significant cyanosis, clubbing, or edema bilateral lower extremities  Data Reviewed: Basic Metabolic Panel:  Recent Labs Lab 04/21/13 1426 04/21/13 2035 04/22/13 0735  NA 143 144 146  K 4.6 4.8 4.4  CL 109 111 111  CO2 17* 16* 15*  GLUCOSE 112* 136* 96  BUN 58* 59* 62*  CREATININE 6.20* 6.20* 6.51*  CALCIUM 9.7 9.6 9.6  MG  --  2.1  --    Liver Function Tests:  Recent Labs Lab 04/21/13 2035  AST 12  ALT 10  ALKPHOS 76  BILITOT 0.3  PROT 6.6  ALBUMIN 3.3*   CBC:  Recent Labs Lab 04/21/13 1426  WBC 12.7*  HGB 10.0*  HCT 29.9*  MCV 95.5  PLT 255   Cardiac Enzymes:  Recent Labs Lab 04/21/13 1613 04/21/13 2035 04/22/13 0040  04/22/13 0735  TROPONINI 0.32* <0.30 <0.30 <0.30   BNP (last 3 results)  Recent Labs  04/21/13 1426 04/22/13 0735  PROBNP 10908.0* 11525.0*    Recent Results (from the past 240 hour(s))  MRSA PCR SCREENING     Status: None   Collection Time    04/21/13  7:02 PM      Result Value Ref Range Status   MRSA by PCR NEGATIVE  NEGATIVE Final   Comment:            The GeneXpert MRSA Assay (FDA     approved for NASAL specimens     only), is one component of a     comprehensive MRSA colonization     surveillance  program. It is not     intended to diagnose MRSA     infection nor to guide or     monitor treatment for     MRSA infections.     Studies:  Recent x-ray studies have been reviewed in detail by the Attending Physician  Scheduled Meds:  Scheduled Meds: . amLODipine  10 mg Oral q morning - 10a  . aspirin EC  81 mg Oral Daily  . atorvastatin  20 mg Oral q morning - 10a  . calcitRIOL  0.25 mcg Oral QHS  . cloNIDine  0.3 mg Oral BID  . heparin  5,000 Units Subcutaneous 3 times per day  . hydrALAZINE  100 mg Oral BID  . metoprolol tartrate  25 mg Oral BID  . pantoprazole  40 mg Oral Q0600  . [START ON 04/23/2013] pneumococcal 23 valent vaccine  0.5 mL Intramuscular Tomorrow-1000  . sodium chloride  3 mL Intravenous Q12H  . tamsulosin  0.4 mg Oral QHS    Time spent on care of this patient: 35 mins   Cherene Altes, MD   [facility] Office  [phone] Pager - Text Page per Amion as per below:  On-Call/Text Page:      [email or web address]      password [email or web address]  If 7PM-7AM, please contact night-coverage [email or web address] Password [email or web address] 04/22/2013, 1:02 PM   LOS: 1 day

## 2013-04-22 NOTE — Care Management Note (Signed)
    Page 1 of 1   04/22/2013     10:32:35 AM   CARE MANAGEMENT NOTE 04/22/2013  Patient:  [name] [name],[name] [name]   Account Number:  [id number]  Date Initiated:  04/22/2013  Documentation initiated by:  Elissa Hefty  Subjective/Objective Assessment:   adm w resp failure, iv lasix drip     Action/Plan:   lives w wife, pcp dr Iona Beard [email or web address]   Anticipated DC Date:     Anticipated DC Plan:  [place]  CM consult      Choice offered to / List presented to:             Status of service:   Medicare Important Message given?   (If response is "NO", the following Medicare IM given date fields will be blank) Date Medicare IM given:   Date Additional Medicare IM given:    Discharge Disposition:    Per UR Regulation:  Reviewed for med. necessity/level of care/duration of stay  If discussed at [place] of Stay Meetings, dates discussed:    Comments:  4/13 1031 debbie Trea Carnegie rn,bsn will moniter pt for dc planning needs as pt progresses.

## 2013-04-22 NOTE — Progress Notes (Signed)
Had first hemodialysis Tx. Tx was ordered for 2hr 63min. Tx was stopped 35 min early due to severe cramping in legs and hands that did not resovle with UF off and a 200cc NS bolus. UF goal was 2.5L. Was only able to get 1463cc of fluid off. VS remained stable during the Tx,

## 2013-04-22 NOTE — Progress Notes (Signed)
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 rate 18, height 5' 7"  (1.702 m), weight 75.116 kg (165 lb 9.6 oz), SpO2 99.00%. NAD NCAT / EOMI Nl WOB, bibasilar crackles RRR AVF + B/T No rashes/lesions  Assessment 1. CKD5HD/ESRD 2. Anemia, at goal 3. Uremia 4. HTN 5. DM 6. Met acidosis 7. Volume Excess   Plan 1. HD today, Tx #1, Use AVF. Pt CLIP process started 2. Check Phos, PTH  Pearson Grippe MD 04/22/2013, 2:23 PM   Recent Labs Lab 04/21/13 1426 04/21/13 2035 04/22/13 0735  NA 143 144 146  K 4.6 4.8 4.4  CL 109 111 111  CO2 17* 16* 15*  GLUCOSE 112* 136* 96  BUN 58* 59* 62*  CREATININE 6.20* 6.20* 6.51*  CALCIUM 9.7 9.6 9.6    Recent Labs Lab 04/21/13 1426  WBC 12.7*  HGB 10.0*  HCT 29.9*  MCV 95.5  PLT 255

## 2013-04-22 NOTE — Progress Notes (Signed)
Subjective:  Breathing better following diuresis.  No chest pain.  Objective:   Vital Signs in the last 24 hours: Temp:  [97.9 F (36.6 C)-98.3 F (36.8 C)] 97.9 F (36.6 C) (04/13 1149) Pulse Rate:  [69-93] 93 (04/13 1149) Resp:  [11-30] 18 (04/13 1149) BP: (126-188)/(66-121) 160/69 mmHg (04/13 1149) SpO2:  [94 %-100 %] 99 % (04/13 1149) Weight:  [165 lb 9.6 oz (75.116 kg)-172 lb 9.6 oz (78.291 kg)] 165 lb 9.6 oz (75.116 kg) (04/13 0431)  Intake/Output from previous day: 04/12 0701 - 04/13 0700 In: 316.2 [P.O.:240; I.V.:76.2] Out: 2025 [Urine:2025]  Medications: . amLODipine  10 mg Oral q morning - 10a  . aspirin EC  81 mg Oral Daily  . atorvastatin  20 mg Oral q morning - 10a  . calcitRIOL  0.25 mcg Oral QHS  . cloNIDine  0.3 mg Oral BID  . heparin  5,000 Units Subcutaneous 3 times per day  . hydrALAZINE  100 mg Oral BID  . metoprolol tartrate  25 mg Oral BID  . pantoprazole  40 mg Oral Q0600  . [START ON 04/23/2013] pneumococcal 23 valent vaccine  0.5 mL Intramuscular Tomorrow-1000  . sodium chloride  3 mL Intravenous Q12H  . tamsulosin  0.4 mg Oral QHS    . furosemide (LASIX) infusion 10 mg/hr (04/21/13 2353)    Physical Exam:   General appearance: alert, cooperative and no distress Neck: no adenopathy, supple, symmetrical, trachea midline, thyroid not enlarged, symmetric, no tenderness/mass/nodules and JVD 8 cm Lungs: decreased BS with basilar rales Heart: regular rate and rhythm and 1/6 Systolic murmur Abdomen: soft, non-tender; bowel sounds normal; no masses,  no organomegaly Extremities: no edema, redness or tenderness in the calves or thighs Pulses: 2+ and symmetric Skin: Skin color, texture, turgor normal. No rashes or lesions Neurologic: Grossly normal   Rate: 80  Rhythm: normal sinus rhythm  ECG:  NSR with lateral T wave changes  Lab Results:    Recent Labs  04/21/13 2035 04/22/13 0735  NA 144 146  K 4.8 4.4  CL 111 111  CO2 16* 15*    GLUCOSE 136* 96  BUN 59* 62*  CREATININE 6.20* 6.51*   Troponin (Point of Care Test)  Recent Labs  04/21/13 1600  TROPIPOC 0.13*    Recent Labs  04/22/13 0040 04/22/13 0735  TROPONINI <0.30 <0.30   Hepatic Function Panel  Recent Labs  04/21/13 2035  PROT 6.6  ALBUMIN 3.3*  AST 12  ALT 10  ALKPHOS 76  BILITOT 0.3   No results found for this basename: INR,  in the last 72 hours BNP (last 3 results)  Recent Labs  04/21/13 1426 04/22/13 0735  PROBNP 10908.0* 11525.0*    Lipid Panel     Component Value Date/Time   CHOL 87 04/22/2013 0735   TRIG 76 04/22/2013 0735   HDL 31* 04/22/2013 0735   CHOLHDL 2.8 04/22/2013 0735   VLDL 15 04/22/2013 0735   LDLCALC 41 04/22/2013 0735      Imaging:  Dg Chest 2 View  04/21/2013   CLINICAL DATA:  Shortness of breath, history of hypertension  EXAM: CHEST  2 VIEW  COMPARISON:  DG CHEST 2 VIEW dated 05/09/2012  FINDINGS: Mild cardiac enlargement. Severe interstitial opacification in the bilateral central lung zones with vascular congestion and tiny bilateral pleural effusions.  IMPRESSION: Congestive heart failure with severe interstitial pulmonary edema   Electronically Signed   By: Skipper Cliche M.D.   On: 04/21/2013 15:41  Assessment/Plan:   Principal Problem:   Acute respiratory failure Active Problems:   Hyperparathyroidism, secondary   Dyspnea   Elevated troponin   CKD (chronic kidney disease)   Acute exacerbation of CHF (congestive heart failure)   Leukocytosis   Anemia   Murmur, cardiac   [name] [name] is a 68 yo man with T2DM, HTN, HLD, CKD stage V/VI  Admitted with pulmonary edema. Mild trop POC elevation with normal subsequent lab; suspect secondary to CHF rather than true NSTEMI.  I/O -2058 since admission with IV lasix. To initiate dialysis today. Will schedule for echo. CXR with severe interstitial edema. If dialysis is to continue consider definitive R and L cardiac cath vs functional testing for risk  stratification.    Troy Sine, MD, [facility] 04/22/2013, 12:11 PM

## 2013-04-22 NOTE — Progress Notes (Signed)
Md notified pts SBP 160s-180s. New orders received. Will continue to monitor. [name] [name]

## 2013-04-22 NOTE — Progress Notes (Signed)
Notified Md about pt's SBP 170's -180's. New orders received.  Will continue to monitor. Wynona Canes

## 2013-04-23 DIAGNOSIS — I272 Pulmonary hypertension, unspecified: Secondary | ICD-10-CM | POA: Diagnosis present

## 2013-04-23 DIAGNOSIS — I5043 Acute on chronic combined systolic (congestive) and diastolic (congestive) heart failure: Secondary | ICD-10-CM | POA: Diagnosis present

## 2013-04-23 LAB — CBC
HCT: 31 % — ABNORMAL LOW (ref 39.0–52.0)
Hemoglobin: 10.3 g/dL — ABNORMAL LOW (ref 13.0–17.0)
MCH: 31.6 pg (ref 26.0–34.0)
MCHC: 33.2 g/dL (ref 30.0–36.0)
MCV: 95.1 fL (ref 78.0–100.0)
PLATELETS: 226 10*3/uL (ref 150–400)
RBC: 3.26 MIL/uL — AB (ref 4.22–5.81)
RDW: 13.1 % (ref 11.5–15.5)
WBC: 12.8 10*3/uL — ABNORMAL HIGH (ref 4.0–10.5)

## 2013-04-23 LAB — BASIC METABOLIC PANEL
BUN: 45 mg/dL — ABNORMAL HIGH (ref 6–23)
CALCIUM: 9.7 mg/dL (ref 8.4–10.5)
CO2: 20 meq/L (ref 19–32)
Chloride: 108 mEq/L (ref 96–112)
Creatinine, Ser: 5.27 mg/dL — ABNORMAL HIGH (ref 0.50–1.35)
GFR calc Af Amer: 12 mL/min — ABNORMAL LOW (ref >90)
GFR calc non Af Amer: 10 mL/min — ABNORMAL LOW (ref >90)
GLUCOSE: 105 mg/dL — AB (ref 70–99)
Potassium: 3.8 mEq/L (ref 3.7–5.3)
Sodium: 147 mEq/L (ref 137–147)

## 2013-04-23 LAB — PTH, INTACT AND CALCIUM
Calcium, Total (PTH): 9.4 mg/dL (ref 8.4–10.5)
PTH: 649.2 pg/mL — ABNORMAL HIGH (ref 14.0–72.0)

## 2013-04-23 LAB — HEPATITIS B CORE ANTIBODY, TOTAL: Hep B Core Total Ab: NONREACTIVE

## 2013-04-23 LAB — GLUCOSE, CAPILLARY
Glucose-Capillary: 107 mg/dL — ABNORMAL HIGH (ref 70–99)
Glucose-Capillary: 149 mg/dL — ABNORMAL HIGH (ref 70–99)
Glucose-Capillary: 131 mg/dL — ABNORMAL HIGH (ref 70–99)
Glucose-Capillary: 142 mg/dL — ABNORMAL HIGH (ref 70–99)

## 2013-04-23 LAB — PARATHYROID HORMONE, INTACT (NO CA): PTH: 533.2 pg/mL — ABNORMAL HIGH (ref 14.0–72.0)

## 2013-04-23 LAB — HEPATITIS B SURFACE ANTIBODY,QUALITATIVE: HEP B S AB: NEGATIVE

## 2013-04-23 LAB — PRO B NATRIURETIC PEPTIDE: Pro B Natriuretic peptide (BNP): 9802 pg/mL — ABNORMAL HIGH (ref 0–125)

## 2013-04-23 LAB — HEMOGLOBIN A1C
Hgb A1c MFr Bld: 5 % (ref <5.7)
Mean Plasma Glucose: 97 mg/dL (ref <117)

## 2013-04-23 LAB — HEPATITIS B SURFACE ANTIGEN: Hepatitis B Surface Ag: NEGATIVE

## 2013-04-23 MED ORDER — METOPROLOL TARTRATE 50 MG PO TABS
50.0000 mg | ORAL_TABLET | Freq: Two times a day (BID) | ORAL | Status: DC
  Administered 2013-04-23 – 2013-04-25 (×4): 50 mg via ORAL
  Filled 2013-04-23 (×5): qty 1

## 2013-04-23 NOTE — Progress Notes (Signed)
Subjective:  Breathing better; dialysis initiated yesterday but stopped secondary to cramps. No chest pain.  Objective:   Vital Signs in the last 24 hours: Temp:  [98 F (36.7 C)-98.4 F (36.9 C)] 98.2 F (36.8 C) (04/14 1127) Pulse Rate:  [41-112] 78 (04/14 1127) Resp:  [14-23] 18 (04/14 0000) BP: (121-190)/(56-112) 121/56 mmHg (04/14 1127) SpO2:  [92 %-100 %] 98 % (04/14 1127) Weight:  [158 lb 8.2 oz (71.9 kg)-163 lb 9.3 oz (74.2 kg)] 158 lb 8.2 oz (71.9 kg) (04/14 0344)  Intake/Output from previous day: 04/13 0701 - 04/14 0700 In: 660 [P.O.:420; I.V.:240] Out: 3188 [Urine:1725]  Medications: . amLODipine  10 mg Oral q morning - 10a  . aspirin EC  81 mg Oral Daily  . atorvastatin  20 mg Oral q morning - 10a  . calcitRIOL  0.25 mcg Oral QHS  . cloNIDine  0.3 mg Oral BID  . heparin  5,000 Units Subcutaneous 3 times per day  . hydrALAZINE  100 mg Oral BID  . insulin aspart  0-9 Units Subcutaneous TID WC  . metoprolol tartrate  25 mg Oral BID  . pantoprazole  40 mg Oral Q0600  . tamsulosin  0.4 mg Oral QHS       Physical Exam:   General appearance: alert, cooperative and no distress Neck: no adenopathy, no carotid bruit, no JVD, supple, symmetrical, trachea midline and thyroid not enlarged, symmetric, no tenderness/mass/nodules Lungs: improved aeration Heart: regular rate and rhythm and 1/6 sem Abdomen: soft, non-tender; bowel sounds normal; no masses,  no organomegaly Extremities: extremities normal, atraumatic, no cyanosis or edema Neurologic: Grossly normal   Rate: 64  Rhythm: normal sinus rhythm  Lab Results:    Recent Labs  04/22/13 0735 04/23/13 0322  NA 146 147  K 4.4 3.8  CL 111 108  CO2 15* 20  GLUCOSE 96 105*  BUN 62* 45*  CREATININE 6.51* 5.27*    Recent Labs  04/22/13 0040 04/22/13 0735  TROPONINI <0.30 <0.30   Hepatic Function Panel  Recent Labs  04/21/13 2035  PROT 6.6  ALBUMIN 3.3*  AST 12  ALT 10  ALKPHOS 76  BILITOT  0.3   No results found for this basename: INR,  in the last 72 hours BNP (last 3 results)  Recent Labs  04/21/13 1426 04/22/13 0735 04/23/13 0322  PROBNP 10908.0* 11525.0* 9802.0*    Lipid Panel     Component Value Date/Time   CHOL 87 04/22/2013 0735   TRIG 76 04/22/2013 0735   HDL 31* 04/22/2013 0735   CHOLHDL 2.8 04/22/2013 0735   VLDL 15 04/22/2013 0735   LDLCALC 41 04/22/2013 0735      Imaging:  Dg Chest 2 View  04/21/2013   CLINICAL DATA:  Shortness of breath, history of hypertension  EXAM: CHEST  2 VIEW  COMPARISON:  DG CHEST 2 VIEW dated 05/09/2012  FINDINGS: Mild cardiac enlargement. Severe interstitial opacification in the bilateral central lung zones with vascular congestion and tiny bilateral pleural effusions.  IMPRESSION: Congestive heart failure with severe interstitial pulmonary edema   Electronically Signed   By: Skipper Cliche M.D.   On: 04/21/2013 15:41    Echo Study Conclusions  - Left ventricle: The cavity size was normal. There was severe concentric hypertrophy. Systolic function was mildly to moderately reduced. The estimated ejection fraction was in the range of 40% to 45%. LV apical false tendon. Inferior hypokinesis. Doppler parameters are consistent with abnormal left ventricular relaxation (grade 1 diastolic dysfunction).  The E/e' ratio is >10, suggesting elevated LV filling pressure. - Aortic valve: Trileaflet; mildly calcified leaflets. There was no stenosis. Mild to moderate regurgitation. - Mitral valve: Mildly thickened leaflets . Moderate, posteriorly directed regurgitation. - Left atrium: Moderately dilated (37 cm2). - Tricuspid valve: Mild regurgitation. - Pulmonary arteries: PA peak pressure: 77mm Hg (S). - Inferior vena cava: The vessel was normal in size; the respirophasic diameter changes were in the normal range (= 50%); findings are consistent with normal central venous pressure. - Pericardium, extracardiac: There was no  pericardial effusion   Assessment/Plan:   Principal Problem:   Acute respiratory failure Active Problems:   Hyperparathyroidism, secondary   Dyspnea   Elevated troponin   CKD (chronic kidney disease)   Acute exacerbation of CHF (congestive heart failure)   Leukocytosis   Anemia   Murmur, cardiac   Pulmonary hypertension   Systolic and diastolic CHF, acute on chronic   Breathing better following dialysis. Now ESRD with initial volume overload contributing to mild Trop increase. EF 40 - 45% with inferior hypokinesis. Severe LVH with Grade I diastolic dysfunction.  Mild/mod AR, Mod MR, and pulmonary Htn.  No angina. With reduced LV function if plan is for DC tomorrow would do outpatient lexiscan myoview for CAD/ischemic evaluation.    Troy Sine, MD, [facility] 04/23/2013, 12:24 PM

## 2013-04-23 NOTE — Progress Notes (Signed)
04/23/2013 10:20 AM Hemodialysis Outpatient Note; this patient has been accepted at the [facility] at [facility] on a Tuesday, Thursday and Saturday 2nd shift schedule. Treatment can begin on Thursday April 25, 2013 at 11:00 AM. Thank you.Gordy Savers

## 2013-04-23 NOTE — Progress Notes (Addendum)
ICU/STEP DOWN [facility] PROGRESS NOTE  [name] [name] NWG:[id number] DOB: Sep 16, 1945 DOA: 04/21/2013 PCP: Benito Mccreedy, MD       Principal Problem:   Acute respiratory failure Active Problems:   Hyperparathyroidism, secondary   Dyspnea   Elevated troponin   CKD (chronic kidney disease)   Acute exacerbation of CHF (congestive heart failure)   Leukocytosis   Anemia   Murmur, cardiac   Pulmonary hypertension   Systolic and diastolic CHF, acute on chronic      VITAL SIGNS:  Temp: 36.9 Pulse Rate: 78 Resp: BP: SpO2: 98% on room FiO2 (%):   Ventilator settings  Mode  Rate  Tidal Volume  FiO2  PO2/ FIO2  PIP  Plateau      Assessment/Plan: Neuro  Resp 1. Acute hypoxic respiratory failure due to pulmonary edema; resolved with HD. Patient to receive HTN on 4/14 per nephrology  CVS  1.Acute CHF -systolic and diastolic  -Due to uncontrolled HTN and volume overload related to kidney failure - to initiate HD -   2.Harsh systolic Murmur  -Systolic and diastolic CHF see echocardiogram results below    3. Elevated troponin  -Per cardiology if patient discharged to in a.m. would perform outpatient [place] for CAD/ischemic evaluation  4. HTN  -Poorly controlled but improving continue amlodipine 10 mg daily, Catapres 0.3 mg BID, hydralazine 100 mg BID -Increase metoprolol to 50 mg BID     GI  Renal balance today;        /overall;        Creatinine ;        Hourly output   1. 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 DM  -Hemoglobin A1c pending   -Continue NovoLog 0-9 units with meals, CBG QAC/QHS   Extremeties  Heme/labs  ID    Code Status: Full Family Communication:  Disposition Plan:     Devices   LINES / TUBES:        Consultants:   Procedures/SIGNIFICANT EVENTS: Echocardiogram 04/23/2013 - Left ventricle: The cavity size was normal. Severe  concentric hypertrophy.  -LVEF= 40% to 45%. LV apical false tendon. Inferior hypokinesis.  -(grade 1 diastolic dysfunction). The E/e' ratio is >10, suggesting elevated LV filling pressure. - Aortic valve: no stenosis. Mild to moderate regurgitation. - Mitral valve: Moderate, posteriorly directed regurgitation. - Left atrium: Moderately dilated (37 cm2). - Tricuspid valve: Mild regurgitation. - Pulmonary arteries: PA peak pressure: 67mm Hg (S). - Inferior vena cava: The vessel was normal in size; the respirophasic diameter changes were in the normal range (= 50%); findings are consistent with normal central venous pressure.      CULTURES:      Antibiotics:    Continuous Infusions:     HPI/Subjective: 68 y.o. M with history of diabetes, hypertension, hyperlipidemia, chronic kidney disease with a fistula that was placed approximately 2 years ago, gastroesophageal reflux disease, prostate cancer status post robotic assisted laparoscopic radical resection 05/18/2012 who presented to the ED with a 2-3 day history of worsening shortness of breath on exertion. Patient was seen in the ED EKG done had T-wave changes in leads V4 through V6 which were more pronounced than prior EKG. Basic metabolic profile had a bicarbonate of 17 BUN of 58 creatinine of 6.20. First set of troponin was elevated at 0.3. CBC had a white count of 12.7 hemoglobin of 10.0 otherwise was within normal limits. Chest x-ray  was consistent with pulmonary edema. 4/14 patient states negative SOB, negative CP, negative N./V.    Exam:   General: A./O. x4, NAD   Cardiovascular: Regular in rate, negative murmurs rubs or gallops  Respiratory: Clear to auscultation bilateral  Abdomen: Soft, nontender, nondistended, plus bowel sounds  Musculoskeletal:  Negative pedal edema, left AV fistula covered and clean negative sign of infection     Data Reviewed: Basic Metabolic Panel:  Recent Labs Lab 04/21/13 1426  04/21/13 2035 04/22/13 0735 04/22/13 1532 04/22/13 2000 04/23/13 0322  NA 143 144 146  --   --  147  K 4.6 4.8 4.4  --   --  3.8  CL 109 111 111  --   --  108  CO2 17* 16* 15*  --   --  20  GLUCOSE 112* 136* 96  --   --  105*  BUN 58* 59* 62*  --   --  45*  CREATININE 6.20* 6.20* 6.51*  --   --  5.27*  CALCIUM 9.7 9.6 9.6  --  9.4 9.7  MG  --  2.1  --   --   --   --   PHOS  --   --   --  4.2 4.1  --    Liver Function Tests:  Recent Labs Lab 04/21/13 2035  AST 12  ALT 10  ALKPHOS 76  BILITOT 0.3  PROT 6.6  ALBUMIN 3.3*   No results found for this basename: LIPASE, AMYLASE,  in the last 168 hours No results found for this basename: AMMONIA,  in the last 168 hours CBC:  Recent Labs Lab 04/21/13 1426 04/23/13 0322  WBC 12.7* 12.8*  HGB 10.0* 10.3*  HCT 29.9* 31.0*  MCV 95.5 95.1  PLT 255 226   Cardiac Enzymes:  Recent Labs Lab 04/21/13 1613 04/21/13 2035 04/22/13 0040 04/22/13 0735  TROPONINI 0.32* <0.30 <0.30 <0.30   BNP (last 3 results)  Recent Labs  04/21/13 1426 04/22/13 0735 04/23/13 0322  PROBNP 10908.0* 11525.0* 9802.0*   CBG:  Recent Labs Lab 04/22/13 1639 04/23/13 0853 04/23/13 1129  GLUCAP 116* 107* 149*    Recent Results (from the past 240 hour(s))  MRSA PCR SCREENING     Status: None   Collection Time    04/21/13  7:02 PM      Result Value Ref Range Status   MRSA by PCR NEGATIVE  NEGATIVE Final   Comment:            The GeneXpert MRSA Assay (FDA     approved for NASAL specimens     only), is one component of a     comprehensive MRSA colonization     surveillance program. It is not     intended to diagnose MRSA     infection nor to guide or     monitor treatment for     MRSA infections.  URINE CULTURE     Status: None   Collection Time    04/21/13  7:04 PM      Result Value Ref Range Status   Specimen Description URINE, CLEAN CATCH   Final   Special Requests NONE   Final   Culture  Setup Time     Final   Value:  04/21/2013 20:27     Performed at [facility]     Final   Value: NO GROWTH     Performed at [facility]  Final   Value: NO GROWTH     Performed at [facility]   Report Status 04/22/2013 FINAL   Final     Dg Chest 2 View  04/21/2013   CLINICAL DATA:  Shortness of breath, history of hypertension  EXAM: CHEST  2 VIEW  COMPARISON:  DG CHEST 2 VIEW dated 05/09/2012  FINDINGS: Mild cardiac enlargement. Severe interstitial opacification in the bilateral central lung zones with vascular congestion and tiny bilateral pleural effusions.  IMPRESSION: Congestive heart failure with severe interstitial pulmonary edema   Electronically Signed   By: Skipper Cliche M.D.   On: 04/21/2013 15:41    Scheduled Meds: . amLODipine  10 mg Oral q morning - 10a  . aspirin EC  81 mg Oral Daily  . atorvastatin  20 mg Oral q morning - 10a  . calcitRIOL  0.25 mcg Oral QHS  . cloNIDine  0.3 mg Oral BID  . heparin  5,000 Units Subcutaneous 3 times per day  . hydrALAZINE  100 mg Oral BID  . insulin aspart  0-9 Units Subcutaneous TID WC  . metoprolol tartrate  25 mg Oral BID  . pantoprazole  40 mg Oral Q0600  . tamsulosin  0.4 mg Oral QHS        Time spent:40 min    Allie Bossier  [facility] Pager [phone]. If 7PM-7AM, please contact night-coverage at [email or web address], password [facility] 04/23/2013, 12:55 PM  LOS: 2 days

## 2013-04-23 NOTE — Progress Notes (Signed)
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, volume overload  Subjective:  HD #1 yesterday evening, stopped early 2/2 cramping despite stopped UF and NS replacement Pt w/o complaint this AM Lying flat on RA with no SOB BP stable Still on lasix gtt Has outpt chair THS 2nd shift [place]   04/13 0701 - 04/14 0700 In: 660 [P.O.:420; I.V.:240] Out: 3188 [Urine:1725]  Filed Weights   04/22/13 2000 04/22/13 2245 04/23/13 0344  Weight: 74.2 kg (163 lb 9.3 oz) 72.3 kg (159 lb 6.3 oz) 71.9 kg (158 lb 8.2 oz)    Current meds: reviewed calcitriol 0.25 daily Current Labs: reviewed    Physical Exam:  Blood pressure 162/69, pulse 89, temperature 98.4 F (36.9 C), temperature source Oral, resp. rate 18, height 5' 7"  (1.702 m), weight 71.9 kg (158 lb 8.2 oz), SpO2 92.00%. NAD NCAT / EOMI Nl WOB,  NO crackles RRR, RUSB murmur abates with occlusion of AVF AVF + B/T No rashes/lesions  Assessment 1. CKD5HD/ESRD 2. Anemia, at goal 3. Uremia, improving 4. HTN, stable, elevated 5. DM  6. Met acidosis, improving with HD 7. Volume Excess, resolving 8. Murmur is actually from AVF   Plan 1. HD in AM tomorrow 4/15 2. Stop lasix gtt 3. Phos at goal, PTH pending 4. Likely ok for DC tomorrow after HD, would like one more tx inpt given not entirely successful yesterday  Pearson Grippe MD 04/23/2013, 10:28 AM   Recent Labs Lab 04/21/13 2035 04/22/13 0735 04/22/13 1532 04/22/13 2000 04/23/13 0322  NA 144 146  --   --  147  K 4.8 4.4  --   --  3.8  CL 111 111  --   --  108  CO2 16* 15*  --   --  20  GLUCOSE 136* 96  --   --  105*  BUN 59* 62*  --   --  45*  CREATININE 6.20* 6.51*  --   --  5.27*  CALCIUM 9.6 9.6  --   --  9.7  PHOS  --   --  4.2 4.1  --     Recent Labs Lab 04/21/13 1426 04/23/13 0322  WBC 12.7* 12.8*  HGB 10.0* 10.3*  HCT 29.9* 31.0*  MCV 95.5 95.1  PLT 255 226

## 2013-04-24 DIAGNOSIS — R0989 Other specified symptoms and signs involving the circulatory and respiratory systems: Secondary | ICD-10-CM

## 2013-04-24 DIAGNOSIS — R0609 Other forms of dyspnea: Secondary | ICD-10-CM

## 2013-04-24 LAB — BASIC METABOLIC PANEL
BUN: 51 mg/dL — AB (ref 6–23)
CO2: 21 mEq/L (ref 19–32)
CREATININE: 6.37 mg/dL — AB (ref 0.50–1.35)
Calcium: 9.9 mg/dL (ref 8.4–10.5)
Chloride: 104 mEq/L (ref 96–112)
GFR calc Af Amer: 10 mL/min — ABNORMAL LOW (ref >90)
GFR, EST NON AFRICAN AMERICAN: 8 mL/min — AB (ref >90)
GLUCOSE: 108 mg/dL — AB (ref 70–99)
POTASSIUM: 4.1 meq/L (ref 3.7–5.3)
Sodium: 142 mEq/L (ref 137–147)

## 2013-04-24 LAB — GLUCOSE, CAPILLARY
Glucose-Capillary: 129 mg/dL — ABNORMAL HIGH (ref 70–99)
Glucose-Capillary: 182 mg/dL — ABNORMAL HIGH (ref 70–99)

## 2013-04-24 MED ORDER — LIDOCAINE HCL (PF) 1 % IJ SOLN: 5.0000 mL | INTRAMUSCULAR | Status: DC | PRN

## 2013-04-24 MED ORDER — PENTAFLUOROPROP-TETRAFLUOROETH EX AERO
INHALATION_SPRAY | CUTANEOUS | Status: AC
  Administered 2013-04-24: 1 via TOPICAL
  Filled 2013-04-24: qty 103.5

## 2013-04-24 MED ORDER — PENTAFLUOROPROP-TETRAFLUOROETH EX AERO
1.0000 "application " | INHALATION_SPRAY | CUTANEOUS | Status: DC | PRN
  Administered 2013-04-24: 1 via TOPICAL

## 2013-04-24 MED ORDER — LIDOCAINE-PRILOCAINE 2.5-2.5 % EX CREA
1.0000 "application " | TOPICAL_CREAM | CUTANEOUS | Status: DC | PRN
  Filled 2013-04-24: qty 5

## 2013-04-24 MED ORDER — SODIUM CHLORIDE 0.9 % IV SOLN: 100.0000 mL | INTRAVENOUS | Status: DC | PRN

## 2013-04-24 MED ORDER — ALTEPLASE 2 MG IJ SOLR
2.0000 mg | Freq: Once | INTRAMUSCULAR | Status: DC | PRN
  Filled 2013-04-24: qty 2

## 2013-04-24 MED ORDER — ACETAMINOPHEN 325 MG PO TABS
ORAL_TABLET | ORAL | Status: AC
  Filled 2013-04-24: qty 1

## 2013-04-24 MED ORDER — HEPARIN SODIUM (PORCINE) 1000 UNIT/ML DIALYSIS: 2000.0000 [IU] | INTRAMUSCULAR | Status: DC | PRN

## 2013-04-24 MED ORDER — NEPRO/CARBSTEADY PO LIQD
237.0000 mL | ORAL | Status: DC | PRN
  Filled 2013-04-24: qty 237

## 2013-04-24 MED ORDER — HEPARIN SODIUM (PORCINE) 1000 UNIT/ML DIALYSIS: 1000.0000 [IU] | INTRAMUSCULAR | Status: DC | PRN

## 2013-04-24 NOTE — Progress Notes (Signed)
Pharmacist Heart Failure Core Measure Documentation  Assessment: Rane Blitch has an EF documented as 40-45% on 04/22/13 by ECHO.  Rationale: Heart failure patients with left ventricular systolic dysfunction (LVSD) and an EF < 40% should be prescribed an angiotensin converting enzyme inhibitor (ACEI) or angiotensin receptor blocker (ARB) at discharge unless a contraindication is documented in the medical record.  This patient is not currently on an ACEI or ARB for HF.  This note is being placed in the record in order to provide documentation that a contraindication to the use of these agents is present for this encounter.  ACE Inhibitor or Angiotensin Receptor Blocker is contraindicated (specify all that apply)  []   ACEI allergy AND ARB allergy []   Angioedema []   Moderate or severe aortic stenosis []   Hyperkalemia []   Hypotension []   Renal artery stenosis [x]   Worsening renal function, preexisting renal disease or dysfunction   Benjamine Sprague [facility] 04/24/2013 10:13 AM

## 2013-04-24 NOTE — Progress Notes (Signed)
[facility] TEAM 1 - Stepdown/ICU TEAM Progress Note  [name] [name] DBZ:[id number] DOB: [birth year] DOA: 04/21/2013 PCP: Benito Mccreedy, MD  Admit HPI / Brief Narrative: 69 y.o. M with history of diabetes, hypertension, hyperlipidemia, chronic kidney disease with a fistula that was placed approximately 2 years ago, gastroesophageal reflux disease, prostate cancer status post robotic assisted laparoscopic radical resection 05/18/2012 who presented to the ED with a 2-3 day history of worsening shortness of breath on exertion. Patient was seen in the ED EKG done had T-wave changes in leads V4 through V6 which were more pronounced than prior EKG. Basic metabolic profile had a bicarbonate of 17 BUN of 58 creatinine of 6.20. First set of troponin was elevated at 0.3. CBC had a white count of 12.7 hemoglobin of 10.0 otherwise was within normal limits. Chest x-ray was consistent with pulmonary edema.  HPI/Subjective: No new complaints today.  Is scheduled for hemodialysis again this evening.  Currently denies chest pain nausea vomiting abdominal pain or shortness of breath.  Assessment/Plan:  Acute hypoxic respiratory failure due to pulmonary edema Rapidly improving with hemodialysis - wean to room air  Acute CHF - diastolic + systolic (EF 12%) Due to uncontrolled HTN and volume overload related to kidney failure - being treated with ongoing dialysis - nephrology to arrange for initiation of outpatient hemodialysis - given newly appreciated systolic component would benefit from coronary artery evaluation - cardiology suggest outpatient Cardiolite/Myoview after discharge - no ACE inhibitor at present due to hypotension - beta blocker dose limited by hypotension  Harsh systolic M TTE reveals moderate mitral valve regurg  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 Nephrology following and arranging for outpatient hemodialysis   Elevated troponin Likely stress induced in setting of severe volume overload and kidney failure - Cards  has seen - for outpatient Cardiolite/Myoview  Anemia of chronic kidney disease Care per Nephrology   HTN >> Hypotension  During initial portion of hospitalization patient suffered with poorly controlled hypertension - with hemodialysis his blood pressure has actually begun to drop and has been moderately hypotensive over the last 12 hours - I will adjust his medication regimen and we will monitor him after his hemodialysis this evening  DM Does not appear to be on meds as outpt - A1c 5.0 - CBG reasonably controlled   Code Status: FULL Family Communication: No family present at time of exam today Disposition Plan: Will not attempt to transfer from stepdown unit as patient will likely discharge home 4/16 if blood pressure remains stable following hemodialysis today  Consultants: Cardiology Nephrology  Procedures: TTE - 4/12 -  - Left ventricle: The cavity size was normal. Severe concentric hypertrophy.  -LVEF= 40% to 45%. LV apical false tendon. Inferior hypokinesis.  -(grade 1 diastolic dysfunction). The E/e' ratio is >10, suggesting elevated LV filling pressure. - Aortic valve: no stenosis. Mild to moderate regurgitation. - Mitral valve: Moderate, posteriorly directed regurgitation. - Left atrium: Moderately dilated (37 cm2). - Tricuspid valve: Mild regurgitation. - Pulmonary arteries: PA peak pressure: 31mm Hg (S). - Inferior vena cava: The vessel was normal in size; the respirophasic diameter changes were in the normal range (= 50%); findings are consistent with normal central venous pressure.  Antibiotics: none  DVT prophylaxis: SQ heparin   Objective: Blood pressure 130/61, pulse 86, temperature 98.7 F (37.1 C), temperature source Oral, resp. rate 19, height 5\' 7"  (1.702 m), weight 72.1 kg (158 lb 15.2 oz), SpO2 98.00%.  Intake/Output Summary (Last 24 hours) at 04/24/13 1408 Last data filed at 04/24/13  1300  Gross per 24 hour  Intake   1280 ml  Output    600 ml    Net    680 ml   Exam: General: No acute respiratory distress  Lungs: Clear to auscultation throughout all fields with no wheeze Cardiovascular: Regular rate and rhythm without gallop or rub - 1/6 holosystolic M  Abdomen: Nontender, nondistended, soft, bowel sounds positive, no rebound, no ascites, no appreciable mass Extremities: No significant cyanosis, clubbing, or edema bilateral lower extremities  Data Reviewed: Basic Metabolic Panel:  Recent Labs Lab 04/21/13 1426 04/21/13 2035 04/22/13 0735 04/22/13 1532 04/22/13 2000 04/23/13 0322 04/24/13 0225  NA 143 144 146  --   --  147 142  K 4.6 4.8 4.4  --   --  3.8 4.1  CL 109 111 111  --   --  108 104  CO2 17* 16* 15*  --   --  20 21  GLUCOSE 112* 136* 96  --   --  105* 108*  BUN 58* 59* 62*  --   --  45* 51*  CREATININE 6.20* 6.20* 6.51*  --   --  5.27* 6.37*  CALCIUM 9.7 9.6 9.6  --  9.4 9.7 9.9  MG  --  2.1  --   --   --   --   --   PHOS  --   --   --  4.2 4.1  --   --    Liver Function Tests:  Recent Labs Lab 04/21/13 2035  AST 12  ALT 10  ALKPHOS 76  BILITOT 0.3  PROT 6.6  ALBUMIN 3.3*   CBC:  Recent Labs Lab 04/21/13 1426 04/23/13 0322  WBC 12.7* 12.8*  HGB 10.0* 10.3*  HCT 29.9* 31.0*  MCV 95.5 95.1  PLT 255 226   Cardiac Enzymes:  Recent Labs Lab 04/21/13 1613 04/21/13 2035 04/22/13 0040 04/22/13 0735  TROPONINI 0.32* <0.30 <0.30 <0.30   BNP (last 3 results)  Recent Labs  04/21/13 1426 04/22/13 0735 04/23/13 0322  PROBNP 10908.0* 11525.0* 9802.0*    Recent Results (from the past 240 hour(s))  MRSA PCR SCREENING     Status: None   Collection Time    04/21/13  7:02 PM      Result Value Ref Range Status   MRSA by PCR NEGATIVE  NEGATIVE Final   Comment:            The GeneXpert MRSA Assay (FDA     approved for NASAL specimens     only), is one component of a     comprehensive MRSA colonization     surveillance program. It is not     intended to diagnose MRSA     infection  nor to guide or     monitor treatment for     MRSA infections.  URINE CULTURE     Status: None   Collection Time    04/21/13  7:04 PM      Result Value Ref Range Status   Specimen Description URINE, CLEAN CATCH   Final   Special Requests NONE   Final   Culture  Setup Time     Final   Value: 04/21/2013 20:27     Performed at [facility]     Final   Value: NO GROWTH     Performed at [facility]   Culture     Final   Value: NO GROWTH  Performed at [facility]   Report Status 04/22/2013 FINAL   Final     Studies:  Recent x-ray studies have been reviewed in detail by the Attending Physician  Scheduled Meds:  Scheduled Meds: . amLODipine  10 mg Oral q morning - 10a  . aspirin EC  81 mg Oral Daily  . atorvastatin  20 mg Oral q morning - 10a  . calcitRIOL  0.25 mcg Oral QHS  . cloNIDine  0.3 mg Oral BID  . heparin  5,000 Units Subcutaneous 3 times per day  . hydrALAZINE  100 mg Oral BID  . insulin aspart  0-9 Units Subcutaneous TID WC  . metoprolol tartrate  50 mg Oral BID  . pantoprazole  40 mg Oral Q0600  . tamsulosin  0.4 mg Oral QHS    Time spent on care of this patient: 35 mins   Cherene Altes, MD   [facility] Office  [phone] Pager - Text Page per Amion as per below:  On-Call/Text Page:      [email or web address]      password [email or web address]  If 7PM-7AM, please contact night-coverage [email or web address] Password [email or web address] 04/24/2013, 2:08 PM   LOS: 3 days

## 2013-04-24 NOTE — Procedures (Signed)
I was present at this dialysis session. I have reviewed the session itself and made appropriate changes.   Pearson Grippe  MD 04/24/2013, 2:40 PM

## 2013-04-24 NOTE — Progress Notes (Signed)
Notified Central telemetry of pt being in HD at this time for all alarms.

## 2013-04-24 NOTE — Progress Notes (Signed)
   Subjective:  No chest pain; awaiting dialysis.   Objective:   Vital Signs in the last 24 hours: Temp:  [97.9 F (36.6 C)-98.8 F (37.1 C)] 98.2 F (36.8 C) (04/15 0746) Pulse Rate:  [66-79] 73 (04/15 0319) Resp:  [16-18] 18 (04/15 0319) BP: (108-141)/(40-65) 108/49 mmHg (04/15 0746) SpO2:  [93 %-99 %] 96 % (04/15 0746) Weight:  [158 lb 15.2 oz (72.1 kg)] 158 lb 15.2 oz (72.1 kg) (04/15 0319)  Intake/Output from previous day: 04/14 0701 - 04/15 0700 In: 1300 [P.O.:1270; I.V.:30] Out: 725 [Urine:725]  Medications: . amLODipine  10 mg Oral q morning - 10a  . aspirin EC  81 mg Oral Daily  . atorvastatin  20 mg Oral q morning - 10a  . calcitRIOL  0.25 mcg Oral QHS  . cloNIDine  0.3 mg Oral BID  . heparin  5,000 Units Subcutaneous 3 times per day  . hydrALAZINE  100 mg Oral BID  . insulin aspart  0-9 Units Subcutaneous TID WC  . metoprolol tartrate  50 mg Oral BID  . pantoprazole  40 mg Oral Q0600  . tamsulosin  0.4 mg Oral QHS       Physical Exam:   General appearance: alert, cooperative and no distress Neck: no adenopathy, no JVD, supple, symmetrical, trachea midline and thyroid not enlarged, symmetric, no tenderness/mass/nodules Lungs: no rales or rhonchi Heart: regular rate and rhythm and 1/6 sem Abdomen: soft, non-tender; bowel sounds normal; no masses,  no organomegaly Extremities: extremities normal, atraumatic, no cyanosis or edema Neurologic: Grossly normal   Rate: 65  Rhythm: normal sinus rhythm  Lab Results:    Recent Labs  04/23/13 0322 04/24/13 0225  NA 147 142  K 3.8 4.1  CL 108 104  CO2 20 21  GLUCOSE 105* 108*  BUN 45* 51*  CREATININE 5.27* 6.37*    Recent Labs  04/22/13 0040 04/22/13 0735  TROPONINI <0.30 <0.30   Hepatic Function Panel  Recent Labs  04/21/13 2035  PROT 6.6  ALBUMIN 3.3*  AST 12  ALT 10  ALKPHOS 76  BILITOT 0.3   No results found for this basename: INR,  in the last 72 hours BNP (last 3  results)  Recent Labs  04/21/13 1426 04/22/13 0735 04/23/13 0322  PROBNP 10908.0* 11525.0* 9802.0*    Lipid Panel     Component Value Date/Time   CHOL 87 04/22/2013 0735   TRIG 76 04/22/2013 0735   HDL 31* 04/22/2013 0735   CHOLHDL 2.8 04/22/2013 0735   VLDL 15 04/22/2013 0735   LDLCALC 41 04/22/2013 0735      Imaging:  No results found.    Assessment/Plan:   Principal Problem:   Acute respiratory failure Active Problems:   Hyperparathyroidism, secondary   Dyspnea   Elevated troponin   CKD (chronic kidney disease)   Acute exacerbation of CHF (congestive heart failure)   Leukocytosis   Anemia   Murmur, cardiac   Pulmonary hypertension   Systolic and diastolic CHF, acute on chronic   No chest pain or dyspnea. For dialysis with possible dc later if tolerates and stable. Will need f/u cardiology ov and plan for outpatient cardiolite/myoview for ischemic assessment.   Troy Sine, MD, [facility] 04/24/2013, 11:40 AM

## 2013-04-24 NOTE — Progress Notes (Signed)
Hemodialysis- Treatment hindered with cramping during last 30 minutes. Pt completed treatment however unable to meet UF goal d/t cramping severely and bp drop x 1 during tx. Vitals stable. Continue to monitor patient.

## 2013-04-25 DIAGNOSIS — I2789 Other specified pulmonary heart diseases: Secondary | ICD-10-CM

## 2013-04-25 LAB — GLUCOSE, CAPILLARY
GLUCOSE-CAPILLARY: 135 mg/dL — AB (ref 70–99)
GLUCOSE-CAPILLARY: 142 mg/dL — AB (ref 70–99)
Glucose-Capillary: 120 mg/dL — ABNORMAL HIGH (ref 70–99)

## 2013-04-25 MED ORDER — HEPARIN SODIUM (PORCINE) 1000 UNIT/ML DIALYSIS
20.0000 [IU]/kg | INTRAMUSCULAR | Status: DC | PRN
Start: 2013-04-25 — End: 2013-04-25

## 2013-04-25 MED ORDER — ACETAMINOPHEN 325 MG PO TABS
ORAL_TABLET | ORAL | Status: AC
  Filled 2013-04-25: qty 2

## 2013-04-25 MED ORDER — METOPROLOL TARTRATE 50 MG PO TABS: 50.0000 mg | ORAL_TABLET | Freq: Two times a day (BID) | ORAL | Status: DC

## 2013-04-25 NOTE — Progress Notes (Signed)
MD  With order to d/c pt. home, called hemodialysis dept. to let them know , HD Rn claimed that pt is in the sched. And needs to be done as ordered by Renal MD . Patient made aware and is willing to have it done before discharging him. MD made aware who claimed if everything is fine with HD then he be discharged.

## 2013-04-25 NOTE — Progress Notes (Signed)
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 rate 18, height 5' 7" (1.702 m), weight 71.2 kg (156 lb 15.5 oz), SpO2 100.00%. NAD NCAT / EOMI Nl WOB,  NO crackles RRR, RUSB murmur abates with occlusion of AVF AVF + B/T No rashes/lesions  Assessment 1. CKD5HD/ESRD 2. Anemia, at goal 3. Uremia, improving 4. HTN, now hypo/norotension 5. DM  6. Met acidosis, improving with HD 7. Volume Excess, resolving 8. Murmur is actually from AVF   Plan 1. HD #3, EDW of 71.5kg 2. Follow PTH as pt rec HD  3. OK for dc post HD today if tolerates ok.  Can go to [facility] tomorrow for paperwork and then nextTx on Saturday.  [name] Sanford MD 04/25/2013, 9:26 AM   Recent Labs Lab 04/22/13 0735 04/22/13 1532 04/22/13 2000 04/23/13 0322 04/24/13 0225  NA 146  --   --  147 142  K 4.4  --   --  3.8 4.1  CL 111  --   --  108 104  CO2 15*  --   --  20 21  GLUCOSE 96  --   --  105* 108*  BUN 62*  --   --  45* 51*  CREATININE 6.51*  --   --  5.27* 6.37*  CALCIUM 9.6  --  9.4 9.7 9.9  PHOS  --  4.2 4.1  --   --     Recent Labs Lab 04/21/13 1426 04/23/13 0322  WBC 12.7* 12.8*  HGB 10.0* 10.3*  HCT 29.9* 31.0*  MCV 95.5 95.1  PLT 255 226              

## 2013-04-25 NOTE — Discharge Summary (Signed)
Physician Discharge Summary  Kaevon Cotta XBM:[id number] DOB: 04-20-1945 DOA: 04/21/2013  PCP: Benito Mccreedy, MD  Admit date: 04/21/2013 Discharge date: 04/25/2013  Time spent: 40 minutes  Recommendations for Outpatient Follow-up:   Acute hypoxic respiratory failure due to pulmonary edema  -Currently on room air without SOB is stable for  -Followup with PCP   Acute CHF - diastolic + systolic (EF 02%)  -Responded to HD and improved BP control.  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    Elevated troponin  -See acute CHF    Anemia of chronic kidney disease  Care per Nephrology   HTN >> Hypotension  - W/I AHA guidelines.  -Discharge on metoprolol 50 mg BID -Would not restart patient's other HTN medication at this time; PCP/cardiologist to add additional medication as required   DM  -Does not appear to be on meds as outpt - A1c 5.0  -Followup with PCP for diabetic control    Discharge Diagnoses:  Principal Problem:   Acute respiratory failure Active Problems:   Hyperparathyroidism, secondary   Dyspnea   Elevated troponin   CKD (chronic kidney disease)   Acute exacerbation of CHF (congestive heart failure)   Leukocytosis   Anemia   Murmur, cardiac   Pulmonary hypertension   Systolic and diastolic CHF, acute on chronic   Discharge Condition: Stable  Diet recommendation: Heart healthy carb modified  Filed Weights   04/24/13 0319 04/24/13 1758 04/25/13 0300  Weight: 72.1 kg (158 lb 15.2 oz)  70.7 kg (155 lb 13.8 oz) 71.2 kg (156 lb 15.5 oz)    History of present illness:  68 y.o. BM PMHx  diabetes, hypertension, hyperlipidemia, chronic kidney disease with a fistula that was placed approximately 2 years ago, gastroesophageal reflux disease, prostate cancer status post robotic assisted laparoscopic radical resection 05/18/2012 who presented to the ED with a 2-3 day history of worsening shortness of breath on exertion. Patient was seen in the ED EKG done had T-wave changes in leads V4 through V6 which were more pronounced than prior EKG. Basic metabolic profile had a bicarbonate of 17 BUN of 58 creatinine of 6.20. First set of troponin was elevated at 0.3. CBC had a white count of 12.7 hemoglobin of 10.0 otherwise was within normal limits. Chest x-ray was consistent with pulmonary edema. 4/14 patient states negative SOB, negative CP, negative N./V. 4/16 patient ambulating around room/ward without CP or SOB stable for discharge. Patient will return to his HD on T/Th/Sat    Procedures: Echocardiogram 04/23/2013  - Left ventricle: The cavity size was normal. Severe concentric hypertrophy.  -LVEF= 40% to 45%. LV apical false tendon. Inferior hypokinesis.  -(grade 1 diastolic dysfunction). The E/e' ratio is >10, suggesting elevated LV filling pressure. - Aortic valve: no stenosis. Mild to moderate regurgitation. - Mitral valve: Moderate, posteriorly directed regurgitation. - Left atrium: Moderately dilated (37 cm2). - Tricuspid valve: Mild regurgitation. - Pulmonary arteries: PA peak pressure: 44mm Hg (S). - Inferior vena cava: Findings are consistent with normal central venous pressure.     Consultations: Dr. Marcello Moores  Georgina Peer (cardiology)  Dr. Pearson Grippe (nephrology)   Antibiotics    Discharge Exam: Filed Vitals:   04/25/13 0012 04/25/13 0300 04/25/13 0744 04/25/13 1119  BP: 94/43 109/42 112/44 124/57  Pulse: 80 70 67 61  Temp: 98.1 F (36.7 C) 98 F (36.7 C) 97.9 F (36.6  C) 97.6 F (36.4 C)  TempSrc: Oral Oral Oral Oral  Resp: 14 14 18 20   Height:      Weight:  71.2 kg (156 lb 15.5 oz)    SpO2: 98% 97% 100% 100%    General: A./O. X4, NAD. Cardiovascular: RRR, Positive 1/6 systolic murmur, Negative rubs or gallops  Respiratory: Clear to auscultation bilateral  Discharge Instructions     Medication List    STOP taking these medications       amLODipine 10 MG tablet  Commonly known as:  NORVASC     cloNIDine 0.3 MG tablet  Commonly known as:  CATAPRES     furosemide 40 MG tablet  Commonly known as:  LASIX     hydrALAZINE 100 MG tablet  Commonly known as:  APRESOLINE      TAKE these medications       aspirin 81 MG tablet  Take 81 mg by mouth every morning.     atorvastatin 20 MG tablet  Commonly known as:  LIPITOR  Take 20 mg by mouth every morning.     calcitRIOL 0.25 MCG capsule  Commonly known as:  ROCALTROL  Take 0.25 mcg by mouth at bedtime.     cholecalciferol 1000 UNITS tablet  Commonly known as:  VITAMIN D  Take 1,000 Units by mouth every morning.     diclofenac sodium 1 % Gel  Commonly known as:  VOLTAREN  Apply 4 g topically 4 (four) times daily. On feet for gout     metoprolol 50 MG tablet  Commonly known as:  LOPRESSOR  Take 1 tablet (50 mg total) by mouth 2 (two) times daily.     OVER THE COUNTER MEDICATION  Take 1 tablet by mouth daily as needed (Dietary supplementation). Red vitamin pill: Take in the morning as needed.     PRESCRIPTION MEDICATION  Apply 1 application topically daily as needed (Itching--spots on waist and top of foot). Gel for itching     tamsulosin 0.4 MG Caps capsule  Commonly known as:  FLOMAX  Take 0.4 mg by mouth at bedtime.       No Known Allergies Follow-up Information   Follow up with OSEI-BONSU,GEORGE, MD. Schedule an appointment as soon as possible for a visit in 1 week. [facility] followup; management of BP medication, diabetic medication)    Specialty:  Internal Medicine    Contact information:   [address] [place] [address] [phone]        The results of significant diagnostics from this hospitalization (including imaging, microbiology, ancillary and laboratory) are listed below for reference.    Significant Diagnostic Studies: Dg Chest 2 View  04/21/2013   CLINICAL DATA:  Shortness of breath, history of hypertension  EXAM: CHEST  2 VIEW  COMPARISON:  DG CHEST 2 VIEW dated 05/09/2012  FINDINGS: Mild cardiac enlargement. Severe interstitial opacification in the bilateral central lung zones with vascular congestion and tiny bilateral pleural effusions.  IMPRESSION: Congestive heart failure with severe interstitial pulmonary edema   Electronically Signed   By: Skipper Cliche M.D.   On: 04/21/2013 15:41    Microbiology: Recent Results (from the past 240 hour(s))  MRSA PCR SCREENING     Status: None   Collection Time    04/21/13  7:02 PM      Result Value Ref Range Status   MRSA by PCR NEGATIVE  NEGATIVE Final   Comment:            The GeneXpert MRSA Assay (FDA     approved for NASAL specimens     only), is one component of a     comprehensive MRSA colonization     surveillance program. It is not     intended to diagnose MRSA     infection nor to guide or     monitor treatment for     MRSA infections.  URINE CULTURE     Status: None   Collection Time    04/21/13  7:04 PM      Result Value Ref Range Status   Specimen Description URINE, CLEAN CATCH   Final   Special Requests NONE   Final   Culture  Setup Time     Final   Value: 04/21/2013 20:27     Performed at [facility]     Final   Value: NO GROWTH     Performed at [facility]   Culture     Final   Value: NO GROWTH     Performed at [facility]   Report Status 04/22/2013 FINAL   Final     Labs: Basic Metabolic Panel:  Recent Labs Lab 04/21/13 1426 04/21/13 2035 04/22/13 0735 04/22/13 1532 04/22/13 2000  04/23/13 0322 04/24/13 0225  NA 143 144 146  --   --  147 142  K 4.6 4.8 4.4  --   --  3.8 4.1  CL 109 111 111  --   --  108 104  CO2 17* 16* 15*  --   --  20 21  GLUCOSE 112* 136* 96  --   --  105* 108*  BUN 58* 59* 62*  --   --  45* 51*  CREATININE 6.20* 6.20* 6.51*  --   --  5.27* 6.37*  CALCIUM 9.7 9.6 9.6  --  9.4 9.7 9.9  MG  --  2.1  --   --   --   --   --   PHOS  --   --   --  4.2 4.1  --   --    Liver Function Tests:  Recent Labs Lab 04/21/13 2035  AST 12  ALT 10  ALKPHOS 76  BILITOT 0.3  PROT 6.6  ALBUMIN 3.3*   No results found for this basename: LIPASE, AMYLASE,  in the last 168 hours No results found for this basename: AMMONIA,  in the last 168 hours CBC:  Recent Labs Lab 04/21/13 1426 04/23/13 0322  WBC 12.7* 12.8*  HGB 10.0* 10.3*  HCT 29.9* 31.0*  MCV 95.5 95.1  PLT 255 226   Cardiac Enzymes:  Recent Labs Lab 04/21/13 1613 04/21/13 2035 04/22/13 0040 04/22/13 0735  TROPONINI 0.32* <0.30 <0.30 <0.30   BNP: BNP (last 3 results)  Recent Labs  04/21/13 1426 04/22/13 0735 04/23/13 0322  PROBNP 10908.0* 11525.0* 9802.0*   CBG:  Recent Labs Lab 04/23/13 2107 04/24/13 0750 04/24/13 1154 04/25/13 0749 04/25/13 1121  GLUCAP 142* 129* 182* 120* 135*       Signed:  Dia Crawford, MD [facility] [phone] pager

## 2013-04-25 NOTE — Progress Notes (Signed)
   Subjective:   No chest pain; For dialysis later today  Objective:   Vital Signs in the last 24 hours: Temp:  [97.6 F (36.4 C)-98.1 F (36.7 C)] 97.6 F (36.4 C) (04/16 1119) Pulse Rate:  [61-84] 61 (04/16 1119) Resp:  [14-20] 20 (04/16 1119) BP: (90-124)/(42-70) 124/57 mmHg (04/16 1119) SpO2:  [97 %-100 %] 100 % (04/16 1119) Weight:  [155 lb 13.8 oz (70.7 kg)-156 lb 15.5 oz (71.2 kg)] 156 lb 15.5 oz (71.2 kg) (04/16 0300)  Intake/Output from previous day: 04/15 0701 - 04/16 0700 In: 600 [P.O.:600] Out: 1258 [Urine:175]  Medications: . aspirin EC  81 mg Oral Daily  . atorvastatin  20 mg Oral q morning - 10a  . calcitRIOL  0.25 mcg Oral QHS  . heparin  5,000 Units Subcutaneous 3 times per day  . insulin aspart  0-9 Units Subcutaneous TID WC  . metoprolol tartrate  50 mg Oral BID  . pantoprazole  40 mg Oral Q0600  . tamsulosin  0.4 mg Oral QHS       Physical Exam:   General appearance: alert, cooperative and no distress Neck: no adenopathy, no carotid bruit, no JVD, supple, symmetrical, trachea midline and thyroid not enlarged, symmetric, no tenderness/mass/nodules Lungs: clear to auscultation bilaterally Heart: regular rate and rhythm; 2/6 sem, rare ectopy Abdomen: soft, non-tender; bowel sounds normal; no masses,  no organomegaly Extremities: extremities normal, atraumatic, no cyanosis or edema Neurologic: Grossly normal   Rate: 67  Rhythm: normal sinus rhythm and PVC  Lab Results:    Recent Labs  04/23/13 0322 04/24/13 0225  NA 147 142  K 3.8 4.1  CL 108 104  CO2 20 21  GLUCOSE 105* 108*  BUN 45* 51*  CREATININE 5.27* 6.37*   No results found for this basename: TROPONINI, CK, MB,  in the last 72 hours Hepatic Function Panel No results found for this basename: PROT, ALBUMIN, AST, ALT, ALKPHOS, BILITOT, BILIDIR, IBILI,  in the last 72 hours No results found for this basename: INR,  in the last 72 hours BNP (last 3 results)  Recent Labs  04/21/13 1426 04/22/13 0735 04/23/13 0322  PROBNP 10908.0* 11525.0* 9802.0*    Lipid Panel     Component Value Date/Time   CHOL 87 04/22/2013 0735   TRIG 76 04/22/2013 0735   HDL 31* 04/22/2013 0735   CHOLHDL 2.8 04/22/2013 0735   VLDL 15 04/22/2013 0735   LDLCALC 41 04/22/2013 0735      Imaging:  No results found.    Assessment/Plan:   Principal Problem:   Acute respiratory failure Active Problems:   Hyperparathyroidism, secondary   Dyspnea   Elevated troponin   CKD (chronic kidney disease)   Acute exacerbation of CHF (congestive heart failure)   Leukocytosis   Anemia   Murmur, cardiac   Pulmonary hypertension   Systolic and diastolic CHF, acute on chronic   No chest pain. Seems to be tolerating dialysis. No dysnea. For possible dc after dialysis later today. Will need cardiology f/u for ischemic assesssment.   Troy Sine, MD, [facility] 04/25/2013, 2:15 PM

## 2013-06-26 ENCOUNTER — Encounter: Payer: Self-pay | Admitting: Radiation Oncology

## 2013-08-08 ENCOUNTER — Encounter (HOSPITAL_COMMUNITY): Payer: Self-pay | Admitting: Emergency Medicine

## 2013-08-08 ENCOUNTER — Emergency Department (INDEPENDENT_AMBULATORY_CARE_PROVIDER_SITE_OTHER)
Admission: EM | Admit: 2013-08-08 | Discharge: 2013-08-08 | Disposition: A | Discharge status: Home / Self Care | Payer: Medicare Other | Source: Home / Self Care

## 2013-08-08 DIAGNOSIS — IMO0002 Reserved for concepts with insufficient information to code with codable children: Secondary | ICD-10-CM | POA: Diagnosis not present

## 2013-08-08 DIAGNOSIS — X58XXXA Exposure to other specified factors, initial encounter: Secondary | ICD-10-CM

## 2013-08-08 DIAGNOSIS — S39013A Strain of muscle, fascia and tendon of pelvis, initial encounter: Secondary | ICD-10-CM

## 2013-08-08 HISTORY — DX: Dependence on renal dialysis: Z99.2

## 2013-08-08 NOTE — Discharge Instructions (Signed)
Groin Strain Ibuprofen for pain as needed A groin strain (also called a groin pull) is an injury to the muscles or tendon on the upper inner part of the thigh. These muscles are called the adductor muscles or groin muscles. They are responsible for moving the leg across the body. A muscle strain occurs when a muscle is overstretched and some muscle fibers are torn. A groin strain can range from mild to severe depending on how many muscle fibers are affected and whether the muscle fibers are partially or completely torn.  Groin strains usually occur during exercise or participation in sports. The injury often happens when a sudden, violent force is placed on a muscle, stretching the muscle too far. A strain is more likely to occur when your muscles are not warmed up or if you are not properly conditioned. Depending on the severity of the groin strain, recovery time may vary from a few weeks to several weeks. Severe injuries often require 4-6 weeks for recovery. In these cases, complete healing can take 4-5 months.  CAUSES   Stretching the groin muscles too far or too suddenly, often during side-to-side motion with an abrupt change in direction.  Putting repeated stress on the groin muscles over a long period of time.  Performing vigorous activity without properly stretching the groin muscles beforehand. SYMPTOMS   Pain and tenderness in the groin area. This begins as sharp pain and persists as a dull ache.  Popping or snapping feeling when the injury occurs (for severe strains).  Swelling or bruising.  Muscle spasms.  Weakness in the leg.  Stiffness in the groin area with decreased ability to move the affected muscles. DIAGNOSIS  Your caregiver will perform a physical exam to diagnose a groin strain. You will be asked about your symptoms and how the injury occurred. X-rays are sometimes needed to rule out a broken bone or cartilage problems. Your caregiver may order a CT scan or MRI if a  complete muscle tear is suspected. TREATMENT  A groin strain will often heal on its own. Your caregiver may prescribe medicines to help manage pain and swelling (anti-inflammatory medicine). You may be told to use crutches for the first few days to minimize your pain. HOME CARE INSTRUCTIONS   Rest. Do not use the strained muscle if it causes pain.  Put ice on the injured area.  Put ice in a plastic bag.  Place a towel between your skin and the bag.  Leave the ice on for 15-20 minutes, every 2-3 hours. Do this for the first 2 days after the injury.  Only take over-the-counter or prescription medicines as directed by your caregiver.  Wrap the injured area with an elastic bandage as directed by your caregiver.  Keep the injured leg raised (elevated).  Walk, stretch, and perform range-of-motion exercises to improve blood flow to the injured area. Only perform these activities if you can do so without any pain. To prevent muscle strains:  Warm up before exercise.  Develop proper conditioning and strength in the groin muscles. SEEK IMMEDIATE MEDICAL CARE IF:   You have increased pain or swelling in the affected area.   Your symptoms are not improving or are getting worse. MAKE SURE YOU:   Understand these instructions.  Will watch your condition.  Will get help right away if you are not doing well or get worse. Document Released: 08/25/2003 Document Revised: 12/14/2011 Document Reviewed: 08/31/2011 [facility] Patient Information 2015 [place], [place]. This information is not intended to  replace advice given to you by your health care provider. Make sure you discuss any questions you have with your health care provider. ° °

## 2013-08-08 NOTE — ED Provider Notes (Signed)
CSN: [id number]     Arrival date & time 08/08/13  1427 History   First MD Initiated Contact with Patient 08/08/13 1440     Chief Complaint  Patient presents with  . Groin Swelling   (Consider location/radiation/quality/duration/timing/severity/associated sxs/prior Treatment) HPI Comments: 68 year old male is complaining of right groin pain for the past 7-8 days. He states it is worse after her walking short medium distances. It is relieved with sitting down and rest. He denies seeing any bulges. Denies genital pain. Denies any new urinary symptoms. Patient has a history of prostate cancer with subsequent surgery. Denies trauma or other injury that may have precipitated this pain. He does walk for exercise.   Past Medical History  Diagnosis Date  . Diabetes mellitus   . Hyperlipidemia   . Hypertension   . Chronic renal insufficiency   . Joint pain   . Gout   . GERD (gastroesophageal reflux disease)   . Arthritis   . Prostate cancer     adenocarcinoma gleason 7  . Bladder neck contracture   . Elevated prostate specific antigen (PSA)   . Urethral stricture unspecified   . Murmur, cardiac 04/21/2013  . CHF (congestive heart failure)   . Shortness of breath   . Dialysis patient    Past Surgical History  Procedure Laterality Date  . Appendectomy  68 yrs old    open  . Av fistula placement, brachiocephalic  6144  . Robot assisted laparoscopic radical prostatectomy N/A 05/18/2012    Procedure: ROBOTIC ASSISTED LAPAROSCOPIC RADICAL PROSTATECTOMY;  Surgeon: Alexis Frock, MD;  Location: [facility];  Service: Urology;  Laterality: N/A;  . Lymphadenectomy Bilateral 05/18/2012    Procedure: LYMPHADENECTOMY;  Surgeon: Alexis Frock, MD;  Location: [facility];  Service: Urology;  Laterality: Bilateral;  . Circumcision, non-newborn    . Kidney surgery    . Pelvic laparoscopy     Family History  Problem Relation Age of Onset  . Diabetes Brother   . Kidney disease Brother   . Hypertension Brother    . Stroke Mother   . Hypertension Sister   . Hypertension Brother   . Hypertension Brother   . Hypertension Sister    History  Substance Use Topics  . Smoking status: Current Every Day Smoker -- 1.00 packs/day for 40 years    Types: Cigarettes  . Smokeless tobacco: Never Used     Comment: reports smoking approximately 15 cigarettes per day  . Alcohol Use: No    Review of Systems  Constitutional: Positive for activity change. Negative for fever and fatigue.  Respiratory: Negative.   Gastrointestinal: Negative.   Genitourinary: Negative.  Negative for dysuria, discharge, penile swelling, scrotal swelling, penile pain and testicular pain.  Musculoskeletal: Negative for back pain, joint swelling, myalgias and neck pain.       As per HPI  Skin: Negative.   Neurological: Negative for dizziness, weakness, numbness and headaches.    Allergies  Review of patient's allergies indicates no known allergies.  Home Medications   Prior to Admission medications   Medication Sig Start Date End Date Taking? Authorizing Provider  aspirin 81 MG tablet Take 81 mg by mouth every morning.     Historical Provider, MD  atorvastatin (LIPITOR) 20 MG tablet Take 20 mg by mouth every morning.    Historical Provider, MD  calcitRIOL (ROCALTROL) 0.25 MCG capsule Take 0.25 mcg by mouth at bedtime.     Historical Provider, MD  cholecalciferol (VITAMIN D) 1000 UNITS tablet Take 1,000 Units  by mouth every morning.     Historical Provider, MD  diclofenac sodium (VOLTAREN) 1 % GEL Apply 4 g topically 4 (four) times daily. On feet for gout    Historical Provider, MD  metoprolol (LOPRESSOR) 50 MG tablet Take 1 tablet (50 mg total) by mouth 2 (two) times daily. 04/25/13   Allie Bossier, MD  OVER THE COUNTER MEDICATION Take 1 tablet by mouth daily as needed (Dietary supplementation). Red vitamin pill: Take in the morning as needed.    Historical Provider, MD  PRESCRIPTION MEDICATION Apply 1 application topically daily  as needed (Itching--spots on waist and top of foot). Gel for itching    Historical Provider, MD  tamsulosin (FLOMAX) 0.4 MG CAPS capsule Take 0.4 mg by mouth at bedtime.     Historical Provider, MD   BP 138/88  Pulse 72  Temp(Src) 98.6 F (37 C) (Oral)  Resp 18  SpO2 100% Physical Exam  Nursing note and vitals reviewed. Constitutional: He is oriented to person, place, and time. He appears well-developed and well-nourished. No distress.  Neck: Normal range of motion. Neck supple.  Cardiovascular: Normal rate.   Pulmonary/Chest: Effort normal. No respiratory distress.  Abdominal: Soft. Bowel sounds are normal. He exhibits no distension and no mass. There is no tenderness. There is no rebound and no guarding. Hernia confirmed negative in the right inguinal area.  Genitourinary: Testes normal and penis normal.    Right testis shows no mass, no swelling and no tenderness. Right testis is descended. Left testis shows no mass, no swelling and no tenderness. Left testis is descended.  Tenderness to small area of L inguina as in diagram. No bulging, distension or swelling.  Deep inguinal palpation via scrotum does not produce pain or tenderness. No masses. Neg cough-tap reflex.  Right hip abduction in a frog position produces pain in the right groin. Genital exam is nl.   Musculoskeletal: He exhibits no edema.  Lymphadenopathy:       Right: No inguinal adenopathy present.  Neurological: He is alert and oriented to person, place, and time. No cranial nerve deficit. He exhibits normal muscle tone.  Skin: Skin is warm and dry.  Psychiatric: He has a normal mood and affect.    ED Course  Procedures (including critical care time) Labs Review Labs Reviewed - No data to display  Imaging Review No results found.   MDM   1. Strain of right inguinal muscle, initial encounter    No evidence of hernia on this exam. Suspect strain only. Rest, limit activity that causes pain.  Ice  locally Motrin prn.    Janne Napoleon, NP 08/08/13 [phone]

## 2013-08-08 NOTE — ED Notes (Signed)
Pt  Noticed   Swelling  Tender  Area  r  Inguinal  Region  He  Noticed  This  About 1  Week  Ago        He  denys  A  specefic  Injury         He   Ambulated  To  Room  With  A  Steady  Fluid  Gait   -  He  denys  Any  Urinary  Symptoms         He  Does  Not  Appear  In  Severe  Distress       He  Is  A  Dialysis   Pt        With  Access  l  Arm

## 2013-08-09 NOTE — ED Provider Notes (Signed)
Medical screening examination/treatment/procedure(s) were performed by a resident physician or non-physician practitioner and as the supervising physician I was immediately available for consultation/collaboration.  Lynne Leader, MD    Gregor Hams, MD 08/09/13 [phone]

## 2013-08-13 ENCOUNTER — Inpatient Hospital Stay (HOSPITAL_COMMUNITY)
Admission: EM | Admit: 2013-08-13 | Discharge: 2013-09-06 | DRG: 233 | Disposition: A | Discharge status: Skilled Nursing Facility | Payer: Medicare Other | Attending: Cardiothoracic Surgery | Admitting: Cardiothoracic Surgery

## 2013-08-13 ENCOUNTER — Encounter (HOSPITAL_COMMUNITY): Payer: Self-pay | Admitting: Emergency Medicine

## 2013-08-13 ENCOUNTER — Emergency Department (HOSPITAL_COMMUNITY): Payer: Medicare Other

## 2013-08-13 DIAGNOSIS — I454 Nonspecific intraventricular block: Secondary | ICD-10-CM | POA: Diagnosis present

## 2013-08-13 DIAGNOSIS — E785 Hyperlipidemia, unspecified: Secondary | ICD-10-CM | POA: Diagnosis present

## 2013-08-13 DIAGNOSIS — I498 Other specified cardiac arrhythmias: Secondary | ICD-10-CM | POA: Diagnosis not present

## 2013-08-13 DIAGNOSIS — E1129 Type 2 diabetes mellitus with other diabetic kidney complication: Secondary | ICD-10-CM | POA: Diagnosis present

## 2013-08-13 DIAGNOSIS — I2510 Atherosclerotic heart disease of native coronary artery without angina pectoris: Secondary | ICD-10-CM | POA: Diagnosis present

## 2013-08-13 DIAGNOSIS — Z79899 Other long term (current) drug therapy: Secondary | ICD-10-CM

## 2013-08-13 DIAGNOSIS — D649 Anemia, unspecified: Secondary | ICD-10-CM | POA: Diagnosis present

## 2013-08-13 DIAGNOSIS — I509 Heart failure, unspecified: Secondary | ICD-10-CM | POA: Diagnosis present

## 2013-08-13 DIAGNOSIS — N2581 Secondary hyperparathyroidism of renal origin: Secondary | ICD-10-CM | POA: Diagnosis present

## 2013-08-13 DIAGNOSIS — M109 Gout, unspecified: Secondary | ICD-10-CM | POA: Diagnosis present

## 2013-08-13 DIAGNOSIS — R778 Other specified abnormalities of plasma proteins: Secondary | ICD-10-CM

## 2013-08-13 DIAGNOSIS — I472 Ventricular tachycardia, unspecified: Secondary | ICD-10-CM | POA: Diagnosis not present

## 2013-08-13 DIAGNOSIS — I959 Hypotension, unspecified: Secondary | ICD-10-CM | POA: Diagnosis not present

## 2013-08-13 DIAGNOSIS — I5043 Acute on chronic combined systolic (congestive) and diastolic (congestive) heart failure: Secondary | ICD-10-CM | POA: Diagnosis present

## 2013-08-13 DIAGNOSIS — M129 Arthropathy, unspecified: Secondary | ICD-10-CM | POA: Diagnosis present

## 2013-08-13 DIAGNOSIS — I4892 Unspecified atrial flutter: Secondary | ICD-10-CM | POA: Diagnosis not present

## 2013-08-13 DIAGNOSIS — I214 Non-ST elevation (NSTEMI) myocardial infarction: Secondary | ICD-10-CM | POA: Diagnosis present

## 2013-08-13 DIAGNOSIS — Z7982 Long term (current) use of aspirin: Secondary | ICD-10-CM | POA: Diagnosis not present

## 2013-08-13 DIAGNOSIS — N185 Chronic kidney disease, stage 5: Secondary | ICD-10-CM

## 2013-08-13 DIAGNOSIS — D72829 Elevated white blood cell count, unspecified: Secondary | ICD-10-CM | POA: Diagnosis not present

## 2013-08-13 DIAGNOSIS — M899 Disorder of bone, unspecified: Secondary | ICD-10-CM | POA: Diagnosis present

## 2013-08-13 DIAGNOSIS — R011 Cardiac murmur, unspecified: Secondary | ICD-10-CM

## 2013-08-13 DIAGNOSIS — F17200 Nicotine dependence, unspecified, uncomplicated: Secondary | ICD-10-CM | POA: Diagnosis present

## 2013-08-13 DIAGNOSIS — I4729 Other ventricular tachycardia: Secondary | ICD-10-CM | POA: Diagnosis not present

## 2013-08-13 DIAGNOSIS — K219 Gastro-esophageal reflux disease without esophagitis: Secondary | ICD-10-CM | POA: Diagnosis present

## 2013-08-13 DIAGNOSIS — I252 Old myocardial infarction: Secondary | ICD-10-CM | POA: Diagnosis not present

## 2013-08-13 DIAGNOSIS — Z992 Dependence on renal dialysis: Secondary | ICD-10-CM

## 2013-08-13 DIAGNOSIS — N186 End stage renal disease: Secondary | ICD-10-CM | POA: Diagnosis present

## 2013-08-13 DIAGNOSIS — Z8546 Personal history of malignant neoplasm of prostate: Secondary | ICD-10-CM | POA: Diagnosis not present

## 2013-08-13 DIAGNOSIS — K5900 Constipation, unspecified: Secondary | ICD-10-CM | POA: Diagnosis not present

## 2013-08-13 DIAGNOSIS — Z951 Presence of aortocoronary bypass graft: Secondary | ICD-10-CM

## 2013-08-13 DIAGNOSIS — I120 Hypertensive chronic kidney disease with stage 5 chronic kidney disease or end stage renal disease: Secondary | ICD-10-CM | POA: Diagnosis present

## 2013-08-13 DIAGNOSIS — D500 Iron deficiency anemia secondary to blood loss (chronic): Secondary | ICD-10-CM

## 2013-08-13 DIAGNOSIS — R079 Chest pain, unspecified: Secondary | ICD-10-CM | POA: Diagnosis present

## 2013-08-13 DIAGNOSIS — J9382 Other air leak: Secondary | ICD-10-CM | POA: Diagnosis not present

## 2013-08-13 DIAGNOSIS — M949 Disorder of cartilage, unspecified: Secondary | ICD-10-CM

## 2013-08-13 DIAGNOSIS — D62 Acute posthemorrhagic anemia: Secondary | ICD-10-CM | POA: Diagnosis present

## 2013-08-13 DIAGNOSIS — R7989 Other specified abnormal findings of blood chemistry: Secondary | ICD-10-CM

## 2013-08-13 LAB — BASIC METABOLIC PANEL
ANION GAP: 19 — AB (ref 5–15)
ANION GAP: 17 — AB (ref 5–15)
BUN: 48 mg/dL — ABNORMAL HIGH (ref 6–23)
BUN: 54 mg/dL — ABNORMAL HIGH (ref 6–23)
CALCIUM: 9.8 mg/dL (ref 8.4–10.5)
CALCIUM: 9.2 mg/dL (ref 8.4–10.5)
CO2: 29 meq/L (ref 19–32)
CO2: 27 mEq/L (ref 19–32)
CREATININE: 10.44 mg/dL — AB (ref 0.50–1.35)
Chloride: 95 mEq/L — ABNORMAL LOW (ref 96–112)
Chloride: 97 mEq/L (ref 96–112)
Creatinine, Ser: 11.56 mg/dL — ABNORMAL HIGH (ref 0.50–1.35)
GFR calc Af Amer: 5 mL/min — ABNORMAL LOW (ref >90)
GFR calc Af Amer: 5 mL/min — ABNORMAL LOW (ref >90)
GFR calc non Af Amer: 4 mL/min — ABNORMAL LOW (ref >90)
GFR calc non Af Amer: 4 mL/min — ABNORMAL LOW (ref >90)
Glucose, Bld: 107 mg/dL — ABNORMAL HIGH (ref 70–99)
Glucose, Bld: 81 mg/dL (ref 70–99)
Potassium: 3.8 mEq/L (ref 3.7–5.3)
Potassium: 4.2 mEq/L (ref 3.7–5.3)
Sodium: 143 mEq/L (ref 137–147)
Sodium: 141 mEq/L (ref 137–147)

## 2013-08-13 LAB — CBC
HCT: 36.8 % — ABNORMAL LOW (ref 39.0–52.0)
HCT: 35 % — ABNORMAL LOW (ref 39.0–52.0)
Hemoglobin: 12.1 g/dL — ABNORMAL LOW (ref 13.0–17.0)
Hemoglobin: 11.2 g/dL — ABNORMAL LOW (ref 13.0–17.0)
MCH: 31.7 pg (ref 26.0–34.0)
MCH: 30.9 pg (ref 26.0–34.0)
MCHC: 32.9 g/dL (ref 30.0–36.0)
MCHC: 32 g/dL (ref 30.0–36.0)
MCV: 96.3 fL (ref 78.0–100.0)
MCV: 96.7 fL (ref 78.0–100.0)
Platelets: 249 10*3/uL (ref 150–400)
Platelets: 231 10*3/uL (ref 150–400)
RBC: 3.82 MIL/uL — AB (ref 4.22–5.81)
RBC: 3.62 MIL/uL — ABNORMAL LOW (ref 4.22–5.81)
RDW: 14.8 % (ref 11.5–15.5)
RDW: 14.8 % (ref 11.5–15.5)
WBC: 13.5 10*3/uL — ABNORMAL HIGH (ref 4.0–10.5)
WBC: 12 10*3/uL — ABNORMAL HIGH (ref 4.0–10.5)

## 2013-08-13 LAB — GLUCOSE, CAPILLARY
Glucose-Capillary: 87 mg/dL (ref 70–99)
Glucose-Capillary: 94 mg/dL (ref 70–99)

## 2013-08-13 LAB — CBG MONITORING, ED
Glucose-Capillary: 60 mg/dL — ABNORMAL LOW (ref 70–99)
Glucose-Capillary: 96 mg/dL (ref 70–99)

## 2013-08-13 LAB — I-STAT TROPONIN, ED
Troponin i, poc: 0.17 ng/mL (ref 0.00–0.08)
Troponin i, poc: 0.2 ng/mL (ref 0.00–0.08)

## 2013-08-13 LAB — HEPARIN LEVEL (UNFRACTIONATED): Heparin Unfractionated: <0.1 IU/mL — ABNORMAL LOW (ref 0.30–0.70)

## 2013-08-13 LAB — MRSA PCR SCREENING: MRSA by PCR: NEGATIVE

## 2013-08-13 LAB — TROPONIN I: TROPONIN I: 0.43 ng/mL — AB (ref <0.30)

## 2013-08-13 MED ORDER — CALCITRIOL 0.5 MCG PO CAPS
0.5000 ug | ORAL_CAPSULE | ORAL | Status: DC
  Administered 2013-08-15 – 2013-08-17 (×2): 0.5 ug via ORAL
  Filled 2013-08-13 (×5): qty 1

## 2013-08-13 MED ORDER — LIDOCAINE HCL (PF) 1 % IJ SOLN: 5.0000 mL | INTRAMUSCULAR | Status: DC | PRN

## 2013-08-13 MED ORDER — SODIUM CHLORIDE 0.9 % IJ SOLN: 3.0000 mL | INTRAMUSCULAR | Status: DC | PRN

## 2013-08-13 MED ORDER — INSULIN ASPART 100 UNIT/ML ~~LOC~~ SOLN
0.0000 [IU] | Freq: Three times a day (TID) | SUBCUTANEOUS | Status: DC
  Administered 2013-08-16: 1 [IU] via SUBCUTANEOUS
  Administered 2013-08-17: 2 [IU] via SUBCUTANEOUS

## 2013-08-13 MED ORDER — MORPHINE SULFATE 4 MG/ML IJ SOLN: 4.0000 mg | Freq: Once | INTRAMUSCULAR | Status: DC

## 2013-08-13 MED ORDER — AMLODIPINE BESYLATE 5 MG PO TABS
5.0000 mg | ORAL_TABLET | Freq: Every day | ORAL | Status: DC
  Administered 2013-08-13 – 2013-08-17 (×5): 5 mg via ORAL
  Filled 2013-08-13 (×7): qty 1

## 2013-08-13 MED ORDER — LIDOCAINE-PRILOCAINE 2.5-2.5 % EX CREA: 1.0000 "application " | TOPICAL_CREAM | CUTANEOUS | Status: DC | PRN

## 2013-08-13 MED ORDER — DEXTROSE 50 % IV SOLN
25.0000 mL | Freq: Once | INTRAVENOUS | Status: AC
  Administered 2013-08-13: 25 mL via INTRAVENOUS
  Filled 2013-08-13: qty 50

## 2013-08-13 MED ORDER — INSULIN ASPART 100 UNIT/ML ~~LOC~~ SOLN: 0.0000 [IU] | Freq: Every day | SUBCUTANEOUS | Status: DC

## 2013-08-13 MED ORDER — SODIUM CHLORIDE 0.9 % IV SOLN
INTRAVENOUS | Status: DC
  Administered 2013-08-14: 07:00:00 via INTRAVENOUS

## 2013-08-13 MED ORDER — ALTEPLASE 2 MG IJ SOLR: 2.0000 mg | Freq: Once | INTRAMUSCULAR | Status: AC | PRN

## 2013-08-13 MED ORDER — SEVELAMER CARBONATE 800 MG PO TABS
1600.0000 mg | ORAL_TABLET | Freq: Three times a day (TID) | ORAL | Status: DC
  Administered 2013-08-15: 1600 mg via ORAL
  Administered 2013-08-15: 800 mg via ORAL
  Administered 2013-08-16 – 2013-08-18 (×5): 1600 mg via ORAL
  Filled 2013-08-13 (×20): qty 2

## 2013-08-13 MED ORDER — MORPHINE SULFATE 4 MG/ML IJ SOLN
4.0000 mg | Freq: Once | INTRAMUSCULAR | Status: AC
  Administered 2013-08-13: 4 mg via INTRAVENOUS
  Filled 2013-08-13: qty 1

## 2013-08-13 MED ORDER — METOPROLOL TARTRATE 12.5 MG HALF TABLET
12.5000 mg | ORAL_TABLET | Freq: Two times a day (BID) | ORAL | Status: DC
  Administered 2013-08-13 – 2013-08-14 (×3): 12.5 mg via ORAL
  Filled 2013-08-13 (×4): qty 1

## 2013-08-13 MED ORDER — RENA-VITE PO TABS
1.0000 | ORAL_TABLET | Freq: Every day | ORAL | Status: DC
  Administered 2013-08-13: 23:00:00 via ORAL
  Administered 2013-08-14 – 2013-08-18 (×5): 1 via ORAL
  Filled 2013-08-13 (×7): qty 1

## 2013-08-13 MED ORDER — HEPARIN SODIUM (PORCINE) 1000 UNIT/ML DIALYSIS
20.0000 [IU]/kg | INTRAMUSCULAR | Status: DC | PRN
  Filled 2013-08-13: qty 2

## 2013-08-13 MED ORDER — HEPARIN BOLUS VIA INFUSION
4000.0000 [IU] | Freq: Once | INTRAVENOUS | Status: AC
  Administered 2013-08-13: 4000 [IU] via INTRAVENOUS
  Filled 2013-08-13: qty 4000

## 2013-08-13 MED ORDER — NA FERRIC GLUC CPLX IN SUCROSE 12.5 MG/ML IV SOLN
62.5000 mg | INTRAVENOUS | Status: DC
  Administered 2013-08-15: 62.5 mg via INTRAVENOUS
  Filled 2013-08-13 (×2): qty 5

## 2013-08-13 MED ORDER — NITROGLYCERIN 0.4 MG SL SUBL: 0.4000 mg | SUBLINGUAL_TABLET | SUBLINGUAL | Status: DC | PRN

## 2013-08-13 MED ORDER — PENTAFLUOROPROP-TETRAFLUOROETH EX AERO: 1.0000 "application " | INHALATION_SPRAY | CUTANEOUS | Status: DC | PRN

## 2013-08-13 MED ORDER — ATORVASTATIN CALCIUM 20 MG PO TABS
20.0000 mg | ORAL_TABLET | Freq: Every morning | ORAL | Status: DC
  Administered 2013-08-13 – 2013-08-18 (×6): 20 mg via ORAL
  Filled 2013-08-13 (×7): qty 1

## 2013-08-13 MED ORDER — SODIUM CHLORIDE 0.9 % IV SOLN: 250.0000 mL | INTRAVENOUS | Status: DC | PRN

## 2013-08-13 MED ORDER — HEPARIN BOLUS VIA INFUSION
3000.0000 [IU] | Freq: Once | INTRAVENOUS | Status: AC
  Administered 2013-08-13: 3000 [IU] via INTRAVENOUS
  Filled 2013-08-13 (×2): qty 3000

## 2013-08-13 MED ORDER — ASPIRIN EC 81 MG PO TBEC
81.0000 mg | DELAYED_RELEASE_TABLET | Freq: Every day | ORAL | Status: DC
  Administered 2013-08-14 – 2013-08-18 (×5): 81 mg via ORAL
  Filled 2013-08-13 (×6): qty 1

## 2013-08-13 MED ORDER — ASPIRIN 81 MG PO CHEW
324.0000 mg | CHEWABLE_TABLET | ORAL | Status: AC
  Administered 2013-08-14: 324 mg via ORAL
  Filled 2013-08-13: qty 4

## 2013-08-13 MED ORDER — SODIUM CHLORIDE 0.9 % IJ SOLN: 3.0000 mL | Freq: Two times a day (BID) | INTRAMUSCULAR | Status: DC

## 2013-08-13 MED ORDER — ACETAMINOPHEN 325 MG PO TABS
650.0000 mg | ORAL_TABLET | ORAL | Status: DC | PRN
  Administered 2013-08-15 – 2013-08-16 (×2): 650 mg via ORAL
  Filled 2013-08-13: qty 2

## 2013-08-13 MED ORDER — HEPARIN (PORCINE) IN NACL 100-0.45 UNIT/ML-% IJ SOLN
1650.0000 [IU]/h | INTRAMUSCULAR | Status: DC
  Administered 2013-08-13: 1250 [IU]/h via INTRAVENOUS
  Administered 2013-08-13: 1000 [IU]/h via INTRAVENOUS
  Filled 2013-08-13 (×4): qty 250

## 2013-08-13 MED ORDER — SODIUM CHLORIDE 0.9 % IV SOLN: 100.0000 mL | INTRAVENOUS | Status: DC | PRN

## 2013-08-13 MED ORDER — ONDANSETRON HCL 4 MG/2ML IJ SOLN: 4.0000 mg | Freq: Four times a day (QID) | INTRAMUSCULAR | Status: DC | PRN

## 2013-08-13 MED ORDER — TAMSULOSIN HCL 0.4 MG PO CAPS
0.4000 mg | ORAL_CAPSULE | Freq: Every day | ORAL | Status: DC
  Administered 2013-08-13 – 2013-08-18 (×6): 0.4 mg via ORAL
  Filled 2013-08-13 (×7): qty 1

## 2013-08-13 MED ORDER — HEPARIN SODIUM (PORCINE) 1000 UNIT/ML DIALYSIS: 1000.0000 [IU] | INTRAMUSCULAR | Status: DC | PRN

## 2013-08-13 MED ORDER — NEPRO/CARBSTEADY PO LIQD
237.0000 mL | ORAL | Status: DC | PRN
  Filled 2013-08-13: qty 237

## 2013-08-13 NOTE — ED Notes (Signed)
Paged Cardiology regarding pt being due for dialysis today. Pt updated on POC.

## 2013-08-13 NOTE — Progress Notes (Signed)
Cardiology PA paged in regards to orders. Patient can eat today but must be NPO after midnight. They have consulted renal and they will take over for HD. No new problems at this time. [name] [name]

## 2013-08-13 NOTE — Progress Notes (Signed)
[place] for heparin Indication: chest pain/ACS  No Known Allergies  Patient Measurements: Height: 5\' 6"  (167.6 cm) Weight: 167 lb (75.751 kg) IBW/kg (Calculated) : 63.8 Heparin dosing weight: 76kg  Vital Signs: Temp: 98.7 F (37.1 C) (08/04 0151) Temp src: Oral (08/04 0634) BP: 125/74 mmHg (08/04 1230) Pulse Rate: 93 (08/04 1230)  Labs:  Recent Labs  08/13/13 0200 08/13/13 0312 08/13/13 0948 08/13/13 1224  HGB 12.1*  --  11.2*  --   HCT 36.8*  --  35.0*  --   PLT 249  --  231  --   HEPARINUNFRC  --   --   --  <0.10*  CREATININE 10.44*  --  11.56*  --   TROPONINI  --  0.43*  --   --     Estimated Creatinine Clearance: 5.7 ml/min (by C-G formula based on Cr of 11.56).  Assessment: 68yo male c/o sudden onset of CP, worsens upon standing, feels similar to prior heartburn, no relief w/ NTG, initial troponin mildly elevated, started on heparin early this morning. Initial heparin level undetectable- spoke with RNs in ED and no issues with line. Had to hold on order to give amp of D50 for low CBG, but this was done after heparin level was drawn. CBC stable, no bleeding noted.  Goal of Therapy:  Heparin level 0.3-0.7 units/ml Monitor platelets by anticoagulation protocol: Yes   Plan:  1. rebolus with heparin 3000 units IV x1 2. Increase heparin drip to 1250units/hr 3. Heparin level in 8 hours 4. Daily heparin level and CBC  Kyndall Amero D. Blayre Papania, PharmD, BCPS Clinical Pharmacist Pager: [phone] 08/13/2013 1:38 PM

## 2013-08-13 NOTE — ED Notes (Signed)
Patient reported NTG SL tab had no effect on his chest pain. Primarily positioning changes his pain. Patient more comfortable sitting up. HOB currently elevated for comfort.

## 2013-08-13 NOTE — ED Notes (Signed)
Paged cardiology to inform the cardiologist of the pts CBG. EDP also made aware.

## 2013-08-13 NOTE — ED Notes (Addendum)
Patient here with chest pain which started while sitting on edge of bed around 1900 last night. States that he could feel the pain coming on before that time. States that pain gets worse when standing, but feels similar to previous heartburn pain. Denies all other anginal equivalents. Patient received ASA 324mg  and NTG SL 0.4mg  x1 via EMS prior to arrival.

## 2013-08-13 NOTE — ED Notes (Signed)
Troponin results given to Dr. Ashok Cordia on Pod A

## 2013-08-13 NOTE — Consult Note (Signed)
[facility] Renal Consultation Note    Indication for Consultation:  Management of ESRD/hemodialysis; anemia, hypertension/volume and secondary hyperparathyroidism PCP:  HPI: [name] [name] is a 68 y.o. 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  He had no SOB, DOE. He had been attending his HD treatments regularly. Most recent Hgb is > 12 but leaving about 1 kg above EDW, because of cramping. A stress test was recommended following his April admission at which time he had a troponin of 0.32. EKG today showed NSR, incomplete BBB. Heart cath was planned for today, but postponed so we were consulted to provide dialysis this eafternoon. Troponins since admission are 0.17>0.43>0.2. He's had no fever, chills, N, V, D. He still makes urine.  He had been feeling fine prior to the events last evening without prior CP.  Past Medical History  Diagnosis Date  . Diabetes mellitus   . Hyperlipidemia   . Hypertension   . Chronic renal insufficiency   . Joint pain   . Gout   . GERD (gastroesophageal reflux disease)   . Arthritis   . Prostate cancer     adenocarcinoma gleason 7  . Bladder neck contracture   . Elevated prostate specific antigen (PSA)   . Urethral stricture unspecified   . Murmur, cardiac 04/21/2013  . CHF (congestive heart failure)   . Shortness of breath   . Dialysis patient    Past Surgical History  Procedure Laterality Date  . Appendectomy  68 yrs old    open  . Av fistula placement, brachiocephalic  1610  . Robot assisted laparoscopic radical prostatectomy N/A 05/18/2012    Procedure: ROBOTIC ASSISTED LAPAROSCOPIC RADICAL PROSTATECTOMY;  Surgeon: Alexis Frock, MD;  Location: [facility];  Service: Urology;  Laterality: N/A;  . Lymphadenectomy Bilateral 05/18/2012    Procedure: LYMPHADENECTOMY;  Surgeon: Alexis Frock, MD;  Location: [facility];   Service: Urology;  Laterality: Bilateral;  . Circumcision, non-newborn    . Kidney surgery    . Pelvic laparoscopy     Family History  Problem Relation Age of Onset  . Diabetes Brother   . Kidney disease Brother   . Hypertension Brother   . Stroke Mother   . Hypertension Sister   . Hypertension Brother   . Hypertension Brother   . Hypertension Sister    Social History:  reports that he has been smoking Cigarettes.  He has a 40 pack-year smoking history. He has never used smokeless tobacco. He reports that he does not drink alcohol or use illicit drugs. No Known Allergies Prior to Admission medications   Medication Sig Start Date End Date Taking? Authorizing Provider  amLODipine (NORVASC) 5 MG tablet Take 5 mg by mouth daily.   Yes Historical Provider, MD  aspirin 81 MG tablet Take 81 mg by mouth every morning.    Yes Historical Provider, MD  atorvastatin (LIPITOR) 20 MG tablet Take 20 mg by mouth every morning.   Yes Historical Provider, MD  glipiZIDE (GLUCOTROL XL) 2.5 MG 24 hr tablet Take 2.5 mg by mouth daily with breakfast.   Yes Historical Provider, MD  tamsulosin (FLOMAX) 0.4 MG CAPS capsule Take 0.4 mg by mouth at bedtime.    Yes Historical Provider, MD   Current Facility-Administered Medications  Medication Dose Route Frequency Provider Last Rate Last Dose  . acetaminophen (TYLENOL) tablet 650 mg  650 mg  Oral Q4H PRN Grafton Folk, MD      . amLODipine (NORVASC) tablet 5 mg  5 mg Oral Daily Grafton Folk, MD   5 mg at 08/13/13 1056  . [START ON 08/14/2013] aspirin EC tablet 81 mg  81 mg Oral Daily Grafton Folk, MD      . atorvastatin (LIPITOR) tablet 20 mg  20 mg Oral q morning - 10a Mian A Yousuf, MD   20 mg at 08/13/13 1056  . heparin ADULT infusion 100 units/mL (25000 units/250 mL)  1,250 Units/hr Intravenous Continuous Lauren Bajbus, RPH 12.5 mL/hr at 08/13/13 1343 1,250 Units/hr at 08/13/13 1343  . insulin aspart (novoLOG) injection 0-5 Units  0-5 Units Subcutaneous QHS  Mian A Yousuf, MD      . insulin aspart (novoLOG) injection 0-9 Units  0-9 Units Subcutaneous TID WC Mian A Yousuf, MD      . metoprolol tartrate (LOPRESSOR) tablet 12.5 mg  12.5 mg Oral BID Grafton Folk, MD   12.5 mg at 08/13/13 1056  . morphine 4 MG/ML injection 4 mg  4 mg Intravenous Once Delora Fuel, MD      . nitroGLYCERIN (NITROSTAT) SL tablet 0.4 mg  0.4 mg Sublingual Q5 Min x 3 PRN Grafton Folk, MD      . ondansetron (ZOFRAN) injection 4 mg  4 mg Intravenous Q6H PRN Grafton Folk, MD      . tamsulosin (FLOMAX) capsule 0.4 mg  0.4 mg Oral QHS Grafton Folk, MD       Labs: Basic Metabolic Panel:  Recent Labs Lab 08/13/13 0200 08/13/13 0948  NA 143 141  K 3.8 4.2  CL 95* 97  CO2 29 27  GLUCOSE 107* 81  BUN 48* 54*  CREATININE 10.44* 11.56*  CALCIUM 9.8 9.2  CBC:  Recent Labs Lab 08/13/13 0200 08/13/13 0948  WBC 13.5* 12.0*  HGB 12.1* 11.2*  HCT 36.8* 35.0*  MCV 96.3 96.7  PLT 249 231   Cardiac Enzymes:  Recent Labs Lab 08/13/13 0312  TROPONINI 0.43*   CBG:  Recent Labs Lab 08/13/13 1227 08/13/13 1316  GLUCAP 60* 96   Studies/Results: Dg Chest Portable 1 View  08/13/2013   CLINICAL DATA:  Chest pain  EXAM: PORTABLE CHEST - 1 VIEW  COMPARISON:  04/21/2013  FINDINGS: Mild bibasilar atelectasis/infiltrate. Negative for heart failure or effusion. Heart size upper normal. Negative for mass lesion.  IMPRESSION: Mild bibasilar atelectasis/ infiltrate. Resolution of congestive heart failure since the prior study.   Electronically Signed   By: Franchot Gallo M.D.   On: 08/13/2013 02:41    ROS: As per HPI otherwise negative. Physical Exam: Filed Vitals:   08/13/13 1330 08/13/13 1400 08/13/13 1430 08/13/13 1600  BP: 136/76 130/73 133/72 145/74  Pulse: 89 81 86 88  Temp:      TempSrc:      Resp: 18 18 20 17   Height:      Weight:      SpO2: 93% 96% 98%      General: Well developed, well nourished, in no acute distress.wering O2 watching TV  Head:  Normocephalic, atraumatic, sclera non-icteric, mucus membranes are moist Neck: Supple. JVD not elevated. Lungs: Clear bilaterally to auscultation without wheezes, rales, or rhonchi. Breathing is unlabored. Heart: irreg irreg on tele - 85-90s Abdomen: Soft, non-tender, non-distended with normoactive bowel sounds. No rebound/guarding. No obvious abdominal masses. M-S:  Strength and to ne appear normal for age. Lower extremities:without edema or ischemic changes,  no open wounds  Neuro: Alert and oriented X 3. Moves all extremities spontaneously. Psych:  Responds to questions appropriately with a normal affect. Dialysis Access: left AVF + bruit/thrill  Dialysis Orders: Center: TTS [facility] 4 hr 180 400/800 2k 2Ca EDW 73 -- venofer 50 per week no Aranesp calcitriol 0.5 Recent labs:  Hgb 12.1 7/30 16% sat ferritin 131 P 5.6 Corr Ca 9.8 iPTH 1000-- UF about 2.5 q HD has only got down to 74s the past 4 tmt - runs full time  Assessment/Plan: 1. NSTEMI - for heart cath tomorrow - plan HD today; troponin 0.43, pain free - work up per cards; had been on statin and baby asa PTA 2. ESRD - TTS - [facility] - HD today K 4.2 this am, been npo - plan HD 2K 2Ca for two hours then switch to 4 K bath for the  remainder of his treatment 3. Hypertension/volume  - ok, attempt to get to edw- if any cramping back off on goal 4. Anemia  - Hgb > 12 , 11.2 on recheck no Aranesp - continue weekly IV Fe 5. Metabolic bone disease -  On 2 Ca bath due to hypercalcemia - continue 0.5 calcitriol/add renvela 2 ac tid = usual dose 6. Nutrition - had been NPO - changed to carb mod renal with fluid restriction/add multivit  [name] Jacobson, PA-C [place] [phone] 08/13/2013, 4:07 PM   I have seen and examined this patient and agree with plan and documentation of MBergman PAC.  Very nice 68 yo AAM with ESRD d/t DM, on HD since April 2015 via L AVF, who experienced substernal chest pain at rest associated with a  troponin bump c/w NSTEMI.  He has been pain free since admission. He had been advised in April when he presented with systolic heart failure (EF 45%), mitral regurg, and pulmonary edema to undergo outpt stress testing but did not have cardiology followup.  Plans are for cardiac cath tomorrow.  He will undergo his routine TTS dialysis this evening.    [name] [name] B,MD 08/13/2013 5:27 PM

## 2013-08-13 NOTE — ED Provider Notes (Signed)
CSN: [id number]     Arrival date & time 08/13/13  0138 History   First MD Initiated Contact with Patient 08/13/13 [phone]     Chief Complaint  Patient presents with  . Chest Pain     (Consider location/radiation/quality/duration/timing/severity/associated sxs/prior Treatment) Patient is a 68 y.o. male presenting with chest pain. The history is provided by the patient.  Chest Pain He had onset this evening of epigastric pain with radiation to left shoulder. He describes it as a squeezing type of pain which is worse when he lays flat. He rates pain at 9/10 if he lays flat at 6/10 if he sits up. There is no associated dyspnea, nausea, diaphoresis. He has never had pain like this before. EMS gave him baby aspirin and nitroglycerin which did not affect the pain at all. He is a dialysis patient and is due for dialysis later today.  Past Medical History  Diagnosis Date  . Diabetes mellitus   . Hyperlipidemia   . Hypertension   . Chronic renal insufficiency   . Joint pain   . Gout   . GERD (gastroesophageal reflux disease)   . Arthritis   . Prostate cancer     adenocarcinoma gleason 7  . Bladder neck contracture   . Elevated prostate specific antigen (PSA)   . Urethral stricture unspecified   . Murmur, cardiac 04/21/2013  . CHF (congestive heart failure)   . Shortness of breath   . Dialysis patient    Past Surgical History  Procedure Laterality Date  . Appendectomy  68 yrs old    open  . Av fistula placement, brachiocephalic  4920  . Robot assisted laparoscopic radical prostatectomy N/A 05/18/2012    Procedure: ROBOTIC ASSISTED LAPAROSCOPIC RADICAL PROSTATECTOMY;  Surgeon: Alexis Frock, MD;  Location: [facility];  Service: Urology;  Laterality: N/A;  . Lymphadenectomy Bilateral 05/18/2012    Procedure: LYMPHADENECTOMY;  Surgeon: Alexis Frock, MD;  Location: [facility];  Service: Urology;  Laterality: Bilateral;  . Circumcision, non-newborn    . Kidney surgery    . Pelvic laparoscopy      Family History  Problem Relation Age of Onset  . Diabetes Brother   . Kidney disease Brother   . Hypertension Brother   . Stroke Mother   . Hypertension Sister   . Hypertension Brother   . Hypertension Brother   . Hypertension Sister    History  Substance Use Topics  . Smoking status: Current Every Day Smoker -- 1.00 packs/day for 40 years    Types: Cigarettes  . Smokeless tobacco: Never Used     Comment: reports smoking approximately 15 cigarettes per day  . Alcohol Use: No    Review of Systems  Cardiovascular: Positive for chest pain.  All other systems reviewed and are negative.     Allergies  Review of patient's allergies indicates no known allergies.  Home Medications   Prior to Admission medications   Medication Sig Start Date End Date Taking? Authorizing Provider  aspirin 81 MG tablet Take 81 mg by mouth every morning.     Historical Provider, MD  atorvastatin (LIPITOR) 20 MG tablet Take 20 mg by mouth every morning.    Historical Provider, MD  calcitRIOL (ROCALTROL) 0.25 MCG capsule Take 0.25 mcg by mouth at bedtime.     Historical Provider, MD  cholecalciferol (VITAMIN D) 1000 UNITS tablet Take 1,000 Units by mouth every morning.     Historical Provider, MD  diclofenac sodium (VOLTAREN) 1 % GEL Apply 4 g  topically 4 (four) times daily. On feet for gout    Historical Provider, MD  metoprolol (LOPRESSOR) 50 MG tablet Take 1 tablet (50 mg total) by mouth 2 (two) times daily. 04/25/13   Allie Bossier, MD  OVER THE COUNTER MEDICATION Take 1 tablet by mouth daily as needed (Dietary supplementation). Red vitamin pill: Take in the morning as needed.    Historical Provider, MD  PRESCRIPTION MEDICATION Apply 1 application topically daily as needed (Itching--spots on waist and top of foot). Gel for itching    Historical Provider, MD  tamsulosin (FLOMAX) 0.4 MG CAPS capsule Take 0.4 mg by mouth at bedtime.     Historical Provider, MD   BP 155/85  Pulse 106  Temp(Src)  98.7 F (37.1 C) (Oral)  Resp 16  Ht 5\' 6"  (1.676 m)  Wt 167 lb (75.751 kg)  BMI 26.97 kg/m2  SpO2 94% Physical Exam  Nursing note and vitals reviewed.  68 year old male, resting comfortably and in no acute distress. Vital signs are significant for hypertension with blood pressure 135/85, and tachycardia with heart rate 106. Oxygen saturation is 94%, which is normal. Head is normocephalic and atraumatic. PERRLA, EOMI. Oropharynx is clear. Neck is nontender and supple without adenopathy or JVD. Back is nontender and there is no CVA tenderness. Lungs are clear without rales, wheezes, or rhonchi. Chest is nontender. Heart has regular rate and rhythm without murmur. Abdomen is soft, flat, nontender without masses or hepatosplenomegaly and peristalsis is normoactive. Extremities have no cyanosis or edema, full range of motion is present. AV fistulas present in the left upper arm with thrill present. Skin is warm and dry without rash. Neurologic: Mental status is normal, cranial nerves are intact, there are no motor or sensory deficits.  ED Course  Procedures (including critical care time) Labs Review Results for orders placed during the hospital encounter of 08/13/13  CBC      Result Value Ref Range   WBC 13.5 (*) 4.0 - 10.5 K/uL   RBC 3.82 (*) 4.22 - 5.81 MIL/uL   Hemoglobin 12.1 (*) 13.0 - 17.0 g/dL   HCT 36.8 (*) 39.0 - 52.0 %   MCV 96.3  78.0 - 100.0 fL   MCH 31.7  26.0 - 34.0 pg   MCHC 32.9  30.0 - 36.0 g/dL   RDW 14.8  11.5 - 15.5 %   Platelets 249  150 - 400 K/uL  BASIC METABOLIC PANEL      Result Value Ref Range   Sodium 143  137 - 147 mEq/L   Potassium 3.8  3.7 - 5.3 mEq/L   Chloride 95 (*) 96 - 112 mEq/L   CO2 29  19 - 32 mEq/L   Glucose, Bld 107 (*) 70 - 99 mg/dL   BUN 48 (*) 6 - 23 mg/dL   Creatinine, Ser 10.44 (*) 0.50 - 1.35 mg/dL   Calcium 9.8  8.4 - 10.5 mg/dL   GFR calc non Af Amer 4 (*) >90 mL/min   GFR calc Af Amer 5 (*) >90 mL/min   Anion gap 19 (*) 5 -  15  I-STAT TROPOININ, ED      Result Value Ref Range   Troponin i, poc 0.17 (*) 0.00 - 0.08 ng/mL   Comment NOTIFIED PHYSICIAN     Comment 3            Imaging Review Dg Chest Portable 1 View  08/13/2013   CLINICAL DATA:  Chest pain  EXAM: PORTABLE CHEST -  1 VIEW  COMPARISON:  04/21/2013  FINDINGS: Mild bibasilar atelectasis/infiltrate. Negative for heart failure or effusion. Heart size upper normal. Negative for mass lesion.  IMPRESSION: Mild bibasilar atelectasis/ infiltrate. Resolution of congestive heart failure since the prior study.   Electronically Signed   By: Franchot Gallo M.D.   On: 08/13/2013 02:41     EKG Interpretation   Date/Time:  Tuesday August 13 2013 01:48:03 EDT Ventricular Rate:  106 PR Interval:  194 QRS Duration: 99 QT Interval:  341 QTC Calculation: 453 R Axis:   90 Text Interpretation:  Sinus tachycardia Probable left atrial enlargement  Borderline right axis deviation Low voltage, extremity leads Borderline ST  elevation, anterior leads When compared with ECG of 04/23/2013, T wave  inversion in the  Anterolateral leads has resolved Confirmed by [facility]  MD,  Dejai Schubach ([id number]) on 08/13/2013 2:05:34 AM      CRITICAL CARE Performed by: [email or web address] Total critical care time: 40 minutes Critical care time was exclusive of separately billable procedures and treating other patients. Critical care was necessary to treat or prevent imminent or life-threatening deterioration. Critical care was time spent personally by me on the following activities: development of treatment plan with patient and/or surrogate as well as nursing, discussions with consultants, evaluation of patient's response to treatment, examination of patient, obtaining history from patient or surrogate, ordering and performing treatments and interventions, ordering and review of laboratory studies, ordering and review of radiographic studies, pulse oximetry and re-evaluation of patient's condition.  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 Troponin can be mildly elevated in renal failure but on review of old records, he had been admitted to in April for pulmonary edema and had a troponin elevated at the same level which was confirmed by the main lab troponin. He started on heparin and main mild troponin will be checked. Is given morphine for pain.  After one dose of morphine, chest pain is almost completely gone and he will be given a second dose. Need lab troponin is still pending. He was noted to have a self-limited run of ventricular tachycardia which resolved without treatment. Case is discussed with Dr. Inda Castle of cardiology service who agrees to admit the patient.  Delora Fuel, MD 29/93/71 [id number]

## 2013-08-13 NOTE — ED Notes (Signed)
i-stat Trop. result given to Dr. Roxanne Mins

## 2013-08-13 NOTE — H&P (Signed)
Cardiology H&P Note  Patient ID: [name] [name], MRN: [id number], DOB/AGE: 1945/10/07 68 y.o. Admit date: 08/13/2013   Date of Consult: 08/13/2013 Primary Physician: Benito Mccreedy, MD Primary Cardiologist:   Chief Complaint: Chest pain     Assessment and Plan:  -NSTEMI - Type I vs II Chest pain highly  atypical , however troponin now more elevated than previous visit and pt with multiple risk factors  -CMP - systolic and diastolic dysfunction with mod MR - compensated  -HTN - controlled  -DM Type II  - ESRD on HD   Plan  Admit to tele  Cycle CE  Start heparin gtt and metoprolol 12.5mg  BID  , cont Aspirin , statin , CCB  Hold glipizide, keep NPO and start SSI  May need LHC in am    68 y.o. BM PMHx diabetes, hypertension, hyperlipidemia, chronic kidney disease on HD, Chronic systolic and diastolic dysfunction EF 60% with moderate MR , admitted  04/2013 for heart failure exacerbation with mildly elevated troponin ( 0.32, plan for outpt cardiolite) here with chest pain   HPI: pt states that when he was getting ready to go to bed he started experiencing pain l" like someone is pushing my chest "  Substernal., non-radiating worse with inspiration and lying on his right side. Denies any  orthopnea, PND , LE edema , DOE ,focal weakness, syncope, bleeding diathesis , claudication , palpitation etc .  Reports medication compliance. Has not been able to obtain the stress test recommended on last hospital d/c   Past Medical History  Diagnosis Date  . Diabetes mellitus   . Hyperlipidemia   . Hypertension   . Chronic renal insufficiency   . Joint pain   . Gout   . GERD (gastroesophageal reflux disease)   . Arthritis   . Prostate cancer     adenocarcinoma gleason 7  . Bladder neck contracture   . Elevated prostate specific antigen (PSA)   . Urethral stricture unspecified   . Murmur, cardiac 04/21/2013  . CHF (congestive heart failure)   . Shortness of breath   . Dialysis patient        Most Recent Cardiac Studies: Left ventricle: The cavity size was normal. Severe concentric hypertrophy.  -LVEF= 40% to 45%. LV apical false tendon. Inferior hypokinesis.  -(grade 1 diastolic dysfunction). The E/e' ratio is >10, suggesting elevated LV filling pressure. - Aortic valve: no stenosis. Mild to moderate regurgitation. - Mitral valve: Moderate, posteriorly directed regurgitation. - Left atrium: Moderately dilated (37 cm2). - Tricuspid valve: Mild regurgitation. - Pulmonary arteries: PA peak pressure: 51mm Hg (S). - Inferior vena cava: Findings are consistent with normal central venous pressure    EKG 08/13/2013 NSR, low voltage QRS in ext lead, incomplete BBB    Surgical History:  Past Surgical History  Procedure Laterality Date  . Appendectomy  68 yrs old    open  . Av fistula placement, brachiocephalic  1093  . Robot assisted laparoscopic radical prostatectomy N/A 05/18/2012    Procedure: ROBOTIC ASSISTED LAPAROSCOPIC RADICAL PROSTATECTOMY;  Surgeon: Alexis Frock, MD;  Location: [facility];  Service: Urology;  Laterality: N/A;  . Lymphadenectomy Bilateral 05/18/2012    Procedure: LYMPHADENECTOMY;  Surgeon: Alexis Frock, MD;  Location: [facility];  Service: Urology;  Laterality: Bilateral;  . Circumcision, non-newborn    . Kidney surgery    . Pelvic laparoscopy       Home Meds: Prior to Admission medications   Medication Sig Start Date End Date Taking? Authorizing Provider  amLODipine (NORVASC) 5 MG tablet Take 5 mg by mouth daily.   Yes Historical Provider, MD  aspirin 81 MG tablet Take 81 mg by mouth every morning.    Yes Historical Provider, MD  atorvastatin (LIPITOR) 20 MG tablet Take 20 mg by mouth every morning.   Yes Historical Provider, MD  glipiZIDE (GLUCOTROL XL) 2.5 MG 24 hr tablet Take 2.5 mg by mouth daily with breakfast.   Yes Historical Provider, MD  tamsulosin (FLOMAX) 0.4 MG CAPS capsule Take 0.4 mg by mouth at bedtime.    Yes Historical Provider, MD     Inpatient Medications:  .  morphine injection  4 mg Intravenous Once   . heparin 1,000 Units/hr (08/13/13 0313)    Allergies: No Known Allergies  History   Social History  . Marital Status: Married    Spouse Name: N/A    Number of Children: N/A  . Years of Education: N/A   Occupational History  . Not on file.   Social History Main Topics  . Smoking status: Current Every Day Smoker -- 1.00 packs/day for 40 years    Types: Cigarettes  . Smokeless tobacco: Never Used     Comment: reports smoking approximately 15 cigarettes per day  . Alcohol Use: No  . Drug Use: No  . Sexual Activity: Not on file   Other Topics Concern  . Not on file   Social History Narrative  . No narrative on file     Family History  Problem Relation Age of Onset  . Diabetes Brother   . Kidney disease Brother   . Hypertension Brother   . Stroke Mother   . Hypertension Sister   . Hypertension Brother   . Hypertension Brother   . Hypertension Sister      Review of Systems General: negative for chills, fever, night sweats or weight changes.  Cardiovascular:per HPI  Dermatological: negative for rash Respiratory: negative for cough or wheezing Urologic: negative for hematuria Abdominal: negative for nausea, vomiting, diarrhea, bright red blood per rectum, melena, or hematemesis Neurologic: negative for visual changes, syncope, or dizziness All other systems reviewed and are otherwise negative except as noted above.  Labs: No results found for this basename: CKTOTAL, CKMB, TROPONINI,  in the last 72 hours Lab Results  Component Value Date   WBC 13.5* 08/13/2013   HGB 12.1* 08/13/2013   HCT 36.8* 08/13/2013   MCV 96.3 08/13/2013   PLT 249 08/13/2013    Recent Labs Lab 08/13/13 0200  NA 143  K 3.8  CL 95*  CO2 29  BUN 48*  CREATININE 10.44*  CALCIUM 9.8  GLUCOSE 107*   Lab Results  Component Value Date   CHOL 87 04/22/2013   HDL 31* 04/22/2013   LDLCALC 41 04/22/2013   TRIG 76  04/22/2013   No results found for this basename: DDIMER  Trop I 0.42  Radiology/Studies:  Dg Chest Portable 1 View  08/13/2013   CLINICAL DATA:  Chest pain  EXAM: PORTABLE CHEST - 1 VIEW  COMPARISON:  04/21/2013  FINDINGS: Mild bibasilar atelectasis/infiltrate. Negative for heart failure or effusion. Heart size upper normal. Negative for mass lesion.  IMPRESSION: Mild bibasilar atelectasis/ infiltrate. Resolution of congestive heart failure since the prior study.   Electronically Signed   By: Franchot Gallo M.D.   On: 08/13/2013 02:41     Physical Exam: Blood pressure 148/74, pulse 104, temperature 98.7 F (37.1 C), temperature source Oral, resp. rate 30, height 5\' 6"  (1.676 m), weight  75.751 kg (167 lb), SpO2 94.00%. General: Well developed, well nourished, in no acute distress.  Neck: Negative for carotid bruits. JVD not elevated. Lungs: Clear bilaterally to auscultation without wheezes, rales, or rhonchi. Breathing is unlabored. Heart: RRR with S1 S2. 3/6 harsh pansystolic murmur radiating to axilla Abdomen: Soft, non-tender, non-distended with normoactive bowel sounds. No hepatomegaly. No rebound/guarding. No obvious abdominal masses. Extremities: No clubbing or cyanosis. No edema.  Distal pedal pulses are 2+ and equal bilaterally. Neuro: Alert and oriented X 3. No facial asymmetry. No focal deficit. Moves all extremities spontaneously. Psych:  Responds to questions appropriately with a normal affect.   Cory Roughen, A M.D  08/13/2013, 3:54 AM

## 2013-08-13 NOTE — ED Notes (Signed)
Patient is dialysis patient, due for dialysis today. Normal schedule is Tues, Thurs, Sat.

## 2013-08-13 NOTE — Progress Notes (Signed)
Patient Name: [name] [name] Date of Encounter: 08/13/2013  Active Problems:   NSTEMI (non-ST elevated myocardial infarction)    Patient Profile: 68 yo male w/ hx DM, HTN, HLD, ESRD on Tu/Th/Sat HD, S&D CHF, supposed to get nuc stress after admit 04/2013 but never did. Admitted early 08/04 w/ chest pain.  SUBJECTIVE: Chest pain resolved. No NTG. Worse with certain movements, lying on right side. Not brought on by ambulation. Unaware of elevated enzymes.   OBJECTIVE Filed Vitals:   08/13/13 1230 08/13/13 1300 08/13/13 1330 08/13/13 1400  BP: 125/74 134/76 136/76 130/73  Pulse: 93 88 89 81  Temp:      TempSrc:      Resp: 24 19 18 18   Height:      Weight:      SpO2: 85% 96% 93% 96%   No intake or output data in the 24 hours ending 08/13/13 1410 Filed Weights   08/13/13 0151  Weight: 167 lb (75.751 kg)    PHYSICAL EXAM General: Well developed, well nourished, male in no acute distress. Head: Normocephalic, atraumatic.  Neck: Supple without bruits, JVD elevated Lungs:  Resp regular and unlabored, rales bases Heart: RRR, S1, S2, no S3, S4, 3/6 murmur; no rub. Abdomen: Soft, non-tender, non-distended, BS + x 4.  Extremities: No clubbing, cyanosis, no edema.  Neuro: Alert and oriented X 3. Moves all extremities spontaneously. Psych: Normal affect.  LABS: CBC: Recent Labs  08/13/13 0200 08/13/13 0948  WBC 13.5* 12.0*  HGB 12.1* 11.2*  HCT 36.8* 35.0*  MCV 96.3 96.7  PLT 249 443   Basic Metabolic Panel: Recent Labs  08/13/13 0200 08/13/13 0948  NA 143 141  K 3.8 4.2  CL 95* 97  CO2 29 27  GLUCOSE 107* 81  BUN 48* 54*  CREATININE 10.44* 11.56*  CALCIUM 9.8 9.2   Cardiac Enzymes: Recent Labs  08/13/13 0312  TROPONINI 0.43*    Recent Labs  08/13/13 0207 08/13/13 0955  TROPIPOC 0.17* 0.20*   TELE:   SR with occasional PVCs.     Radiology/Studies: Dg Chest Portable 1 View 08/13/2013   CLINICAL DATA:  Chest pain  EXAM: PORTABLE CHEST - 1 VIEW   COMPARISON:  04/21/2013  FINDINGS: Mild bibasilar atelectasis/infiltrate. Negative for heart failure or effusion. Heart size upper normal. Negative for mass lesion.  IMPRESSION: Mild bibasilar atelectasis/ infiltrate. Resolution of congestive heart failure since the prior study.   Electronically Signed   By: Franchot Gallo M.D.   On: 08/13/2013 02:41   Current Medications:  . amLODipine  5 mg Oral Daily  . [START ON 08/14/2013] aspirin EC  81 mg Oral Daily  . atorvastatin  20 mg Oral q morning - 10a  . insulin aspart  0-5 Units Subcutaneous QHS  . insulin aspart  0-9 Units Subcutaneous TID WC  . metoprolol tartrate  12.5 mg Oral BID  .  morphine injection  4 mg Intravenous Once  . tamsulosin  0.4 mg Oral QHS   . heparin 1,250 Units/hr (08/13/13 1343)    ASSESSMENT AND PLAN: Active Problems:   NSTEMI (non-ST elevated myocardial infarction) - Pain-free on ASA, heparin, statin, BB. Agree with keeping in stepdown for now. Cath vs stress test discussed w/ patient. The risks and benefits of a cardiac catheterization including, but not limited to, death, stroke, MI, kidney damage and bleeding were discussed with the patient who indicates understanding and agrees to proceed.     ESRD on HD - Tu/Th/Sat. Will call  Dr. Detterding and colleagues.     HTN - continue home meds    HLD - on statin, ck profile    DM - Make sure on SSI  Signed, Rosaria Ferries , PA-C 2:10 PM 08/13/2013    Patient seen and examined. Agree with assessment and plan. Pleasant 68 AAM now on dialysis fior ESRD (T, TH, Sat) , with a h/o htn, DM, hld who was admitted 4 months ago with chest pain and had mildly positive troponins. He was readmitted during the night with recurrent chest tightness. Troponins are again mildly positive without acute ECG changes. He is currently pain free. Discussed further evaluation and I have recomended definitive cardiac catheterization in light of risk factors and co-morbidities now that he is on  dialysis. Will arrange for dialysis today and plan cath in am with possible pci.   Troy Sine, MD, [facility] 08/13/2013 2:33 PM

## 2013-08-13 NOTE — ED Notes (Signed)
Called bed control about the status of the pts stepdown bed and she informed me a bed was being cleaned but it would probably be at least 45 minutes.

## 2013-08-13 NOTE — Progress Notes (Signed)
ANTICOAGULATION CONSULT NOTE - Initial Consult  Pharmacy Consult for heparin Indication: chest pain/ACS  No Known Allergies  Patient Measurements: Height: 5\' 6"  (167.6 cm) Weight: 167 lb (75.751 kg) IBW/kg (Calculated) : 63.8  Vital Signs: Temp: 98.7 F (37.1 C) (08/04 0151) Temp src: Oral (08/04 0151) BP: 155/85 mmHg (08/04 0200) Pulse Rate: 106 (08/04 0200)  Labs:  Recent Labs  08/13/13 0200  HGB 12.1*  HCT 36.8*  PLT 249    Estimated Creatinine Clearance: 10.3 ml/min (by C-G formula based on Cr of 6.37).   Medical History: Past Medical History  Diagnosis Date  . Diabetes mellitus   . Hyperlipidemia   . Hypertension   . Chronic renal insufficiency   . Joint pain   . Gout   . GERD (gastroesophageal reflux disease)   . Arthritis   . Prostate cancer     adenocarcinoma gleason 7  . Bladder neck contracture   . Elevated prostate specific antigen (PSA)   . Urethral stricture unspecified   . Murmur, cardiac 04/21/2013  . CHF (congestive heart failure)   . Shortness of breath   . Dialysis patient     Assessment: 68yo male c/o sudden onset of CP, worsens upon standing, feels similar to prior heartburn, no relief w/ NTG, initial troponin mildly elevated, to begin heparin.  Goal of Therapy:  Heparin level 0.3-0.7 units/ml Monitor platelets by anticoagulation protocol: Yes   Plan:  Will give heparin 4000 units IV bolus x1 followed by gtt at 1000 units/hr and monitor heparin levels and CBC.  Wynona Neat, PharmD, BCPS  08/13/2013,2:30 AM

## 2013-08-13 NOTE — Progress Notes (Signed)
ANTICOAGULATION CONSULT NOTE - Follow Up Consult  Pharmacy Consult for Heparin  Indication: chest pain/ACS  No Known Allergies  Patient Measurements: Height: 5\' 6"  (167.6 cm) Weight: 157 lb 6.5 oz (71.4 kg) IBW/kg (Calculated) : 63.8  Vital Signs: Temp: 98.3 F (36.8 C) (08/04 2240) Temp src: Oral (08/04 2240) BP: 113/70 mmHg (08/04 2240) Pulse Rate: 103 (08/04 2240)  Labs:  Recent Labs  08/13/13 0200 08/13/13 0312 08/13/13 0948 08/13/13 1224 08/13/13 2200  HGB 12.1*  --  11.2*  --   --   HCT 36.8*  --  35.0*  --   --   PLT 249  --  231  --   --   HEPARINUNFRC  --   --   --  <0.10* <0.10*  CREATININE 10.44*  --  11.56*  --   --   TROPONINI  --  0.43*  --   --   --     Estimated Creatinine Clearance: 5.7 ml/min (by C-G formula based on Cr of 11.56).   Medications:  Heparin 1250 units/hr  Assessment: Heparin for CP, HL is undetectable, HL drawn during HD (normally would wait until a few hours after HD to be more accurate--will be more cautious with rate increase and re-check in AM). Other labs as above.   Goal of Therapy:  Heparin level 0.3-0.7 units/ml Monitor platelets by anticoagulation protocol: Yes   Plan:  -Increase heparin drip to 1400 units/hr -0800 HL -Daily CBC/HL -Monitor for bleeding  Narda Bonds 08/13/2013,11:23 PM

## 2013-08-14 ENCOUNTER — Other Ambulatory Visit: Payer: Self-pay

## 2013-08-14 ENCOUNTER — Encounter (HOSPITAL_COMMUNITY)
Admission: EM | Disposition: A | Discharge status: Skilled Nursing Facility | Payer: Medicare Other | Source: Home / Self Care | Attending: Cardiothoracic Surgery

## 2013-08-14 DIAGNOSIS — D649 Anemia, unspecified: Secondary | ICD-10-CM

## 2013-08-14 DIAGNOSIS — I2510 Atherosclerotic heart disease of native coronary artery without angina pectoris: Secondary | ICD-10-CM

## 2013-08-14 DIAGNOSIS — I4729 Other ventricular tachycardia: Secondary | ICD-10-CM

## 2013-08-14 DIAGNOSIS — I472 Ventricular tachycardia, unspecified: Secondary | ICD-10-CM | POA: Diagnosis not present

## 2013-08-14 HISTORY — PX: LEFT HEART CATHETERIZATION WITH CORONARY ANGIOGRAM: SHX5451

## 2013-08-14 LAB — BASIC METABOLIC PANEL
ANION GAP: 13 (ref 5–15)
BUN: 23 mg/dL (ref 6–23)
CALCIUM: 9.3 mg/dL (ref 8.4–10.5)
CO2: 28 meq/L (ref 19–32)
Chloride: 102 mEq/L (ref 96–112)
Creatinine, Ser: 5.92 mg/dL — ABNORMAL HIGH (ref 0.50–1.35)
GFR calc Af Amer: 10 mL/min — ABNORMAL LOW (ref >90)
GFR calc non Af Amer: 9 mL/min — ABNORMAL LOW (ref >90)
Glucose, Bld: 94 mg/dL (ref 70–99)
POTASSIUM: 3.9 meq/L (ref 3.7–5.3)
SODIUM: 143 meq/L (ref 137–147)

## 2013-08-14 LAB — POCT ACTIVATED CLOTTING TIME: ACTIVATED CLOTTING TIME: 129 s

## 2013-08-14 LAB — CBC
HCT: 37.7 % — ABNORMAL LOW (ref 39.0–52.0)
Hemoglobin: 12.1 g/dL — ABNORMAL LOW (ref 13.0–17.0)
MCH: 30.8 pg (ref 26.0–34.0)
MCHC: 32.1 g/dL (ref 30.0–36.0)
MCV: 95.9 fL (ref 78.0–100.0)
PLATELETS: 258 10*3/uL (ref 150–400)
RBC: 3.93 MIL/uL — ABNORMAL LOW (ref 4.22–5.81)
RDW: 14.9 % (ref 11.5–15.5)
WBC: 10.1 10*3/uL (ref 4.0–10.5)

## 2013-08-14 LAB — GLUCOSE, CAPILLARY
GLUCOSE-CAPILLARY: 126 mg/dL — AB (ref 70–99)
GLUCOSE-CAPILLARY: 121 mg/dL — AB (ref 70–99)
Glucose-Capillary: 100 mg/dL — ABNORMAL HIGH (ref 70–99)
Glucose-Capillary: 83 mg/dL (ref 70–99)

## 2013-08-14 LAB — HEPATITIS B SURFACE ANTIGEN: Hepatitis B Surface Ag: NEGATIVE

## 2013-08-14 LAB — HEPARIN LEVEL (UNFRACTIONATED): Heparin Unfractionated: <0.1 IU/mL — ABNORMAL LOW (ref 0.30–0.70)

## 2013-08-14 LAB — PROTIME-INR
INR: 1.06 (ref 0.00–1.49)
PROTHROMBIN TIME: 13.8 s (ref 11.6–15.2)

## 2013-08-14 SURGERY — LEFT HEART CATHETERIZATION WITH CORONARY ANGIOGRAM
Anesthesia: LOCAL

## 2013-08-14 MED ORDER — METOPROLOL TARTRATE 25 MG PO TABS
25.0000 mg | ORAL_TABLET | Freq: Two times a day (BID) | ORAL | Status: DC
  Administered 2013-08-14 – 2013-08-17 (×6): 25 mg via ORAL
  Filled 2013-08-14 (×7): qty 1

## 2013-08-14 MED ORDER — METOPROLOL TARTRATE 12.5 MG HALF TABLET
12.5000 mg | ORAL_TABLET | ORAL | Status: AC
  Administered 2013-08-14: 12.5 mg via ORAL

## 2013-08-14 MED ORDER — LIDOCAINE HCL (PF) 1 % IJ SOLN
INTRAMUSCULAR | Status: AC
  Filled 2013-08-14: qty 30

## 2013-08-14 MED ORDER — HEPARIN (PORCINE) IN NACL 2-0.9 UNIT/ML-% IJ SOLN
INTRAMUSCULAR | Status: AC
  Filled 2013-08-14: qty 1000

## 2013-08-14 MED ORDER — MIDAZOLAM HCL 2 MG/2ML IJ SOLN
INTRAMUSCULAR | Status: AC
  Filled 2013-08-14: qty 2

## 2013-08-14 MED ORDER — NITROGLYCERIN 1 MG/10 ML FOR IR/CATH LAB
INTRA_ARTERIAL | Status: AC
  Filled 2013-08-14: qty 10

## 2013-08-14 MED ORDER — FENTANYL CITRATE 0.05 MG/ML IJ SOLN
INTRAMUSCULAR | Status: AC
  Filled 2013-08-14: qty 2

## 2013-08-14 MED ORDER — HEPARIN (PORCINE) IN NACL 100-0.45 UNIT/ML-% IJ SOLN
1850.0000 [IU]/h | INTRAMUSCULAR | Status: DC
  Administered 2013-08-14: 1650 [IU]/h via INTRAVENOUS
  Administered 2013-08-15: 1850 [IU]/h via INTRAVENOUS
  Administered 2013-08-16 (×2): 2050 [IU]/h via INTRAVENOUS
  Administered 2013-08-18: 1850 [IU]/h via INTRAVENOUS
  Administered 2013-08-18: 2050 [IU]/h via INTRAVENOUS
  Filled 2013-08-14 (×18): qty 250

## 2013-08-14 NOTE — Consult Note (Addendum)
[address]       [place] [address]             [phone]      Cardiothoracic Surgery Consultation  Reason for Consult: Severe 3-vessel coronary disease s/p NSTEMI Referring Physician: Dr. Kathlyn Sacramento  Ajahni Nay is an 68 y.o. male.  HPI:   The patient is a 68 year old gentleman with type II DM, ESRD on HD, hypertension, and a history of systolic and diastolic dysfunction with moderate MR by echo in April 2015. He presented with a 2 day history of substernal chest discomfort that waxed and waned associated with shortness of breath and generalized weakness. He ruled in for NSTEMI with troponin of 0.42. Cardiac cath today showed severe 3 vessel coronary disease with the culprit appearing to be a 99% proximal RCA stenosis. The EF was about 40% with no significant MR. He has had no further symptoms since yesterday.  Past Medical History  Diagnosis Date  . Diabetes mellitus   . Hyperlipidemia   . Hypertension   . Chronic renal insufficiency   . Joint pain   . Gout   . GERD (gastroesophageal reflux disease)   . Arthritis   . Prostate cancer     adenocarcinoma gleason 7  . Bladder neck contracture   . Elevated prostate specific antigen (PSA)   . Urethral stricture unspecified   . Murmur, cardiac 04/21/2013  . CHF (congestive heart failure)   . Shortness of breath   . Dialysis patient     Past Surgical History  Procedure Laterality Date  . Appendectomy  68 yrs old    open  . Av fistula placement, brachiocephalic  [id number]  . Robot assisted laparoscopic radical prostatectomy N/A 05/18/2012    Procedure: ROBOTIC ASSISTED LAPAROSCOPIC RADICAL PROSTATECTOMY;  Surgeon: Alexis Frock, MD;  Location: [facility];  Service: Urology;  Laterality: N/A;  . Lymphadenectomy Bilateral 05/18/2012    Procedure: LYMPHADENECTOMY;  Surgeon: Alexis Frock, MD;  Location: [facility];  Service: Urology;  Laterality: Bilateral;  . Circumcision, non-newborn    . Kidney surgery      . Pelvic laparoscopy      Family History  Problem Relation Age of Onset  . Diabetes Brother   . Kidney disease Brother   . Hypertension Brother   . Stroke Mother   . Hypertension Sister   . Hypertension Brother   . Hypertension Brother   . Hypertension Sister     Social History:  reports that he has been smoking Cigarettes.  He has a 40 pack-year smoking history. He has never used smokeless tobacco. He reports that he does not drink alcohol or use illicit drugs.  Allergies: No Known Allergies  Medications:  I have reviewed the patient's current medications. Prior to Admission:  Prescriptions prior to admission  Medication Sig Dispense Refill  . amLODipine (NORVASC) 5 MG tablet Take 5 mg by mouth daily.      Marland Kitchen aspirin 81 MG tablet Take 81 mg by mouth every morning.       Marland Kitchen atorvastatin (LIPITOR) 20 MG tablet Take 20 mg by mouth every morning.      Marland Kitchen glipiZIDE (GLUCOTROL XL) 2.5 MG 24 hr tablet Take 2.5 mg by mouth daily with breakfast.      . tamsulosin (FLOMAX) 0.4 MG CAPS capsule Take 0.4 mg by mouth at bedtime.        Scheduled: . amLODipine  5 mg Oral Daily  .  aspirin EC  81 mg Oral Daily  . atorvastatin  20 mg Oral q morning - 10a  . calcitRIOL  0.5 mcg Oral Q T,Th,Sa-HD  . [START ON 08/15/2013] ferric gluconate (FERRLECIT/NULECIT) IV  62.5 mg Intravenous Q Thu-HD  . insulin aspart  0-5 Units Subcutaneous QHS  . insulin aspart  0-9 Units Subcutaneous TID WC  . metoprolol tartrate  25 mg Oral BID  .  morphine injection  4 mg Intravenous Once  . multivitamin  1 tablet Oral QHS  . sevelamer carbonate  1,600 mg Oral TID WC  . tamsulosin  0.4 mg Oral QHS   Continuous: . heparin     WEX:HBZJIR chloride, sodium chloride, acetaminophen, feeding supplement (NEPRO CARB STEADY), heparin, heparin, lidocaine (PF), lidocaine-prilocaine, nitroGLYCERIN, ondansetron (ZOFRAN) IV, pentafluoroprop-tetrafluoroeth Anti-infectives   None      Results for orders placed during the  hospital encounter of 08/13/13 (from the past 48 hour(s))  CBC     Status: Abnormal   Collection Time    08/13/13  2:00 AM      Result Value Ref Range   WBC 13.5 (*) 4.0 - 10.5 K/uL   RBC 3.82 (*) 4.22 - 5.81 MIL/uL   Hemoglobin 12.1 (*) 13.0 - 17.0 g/dL   HCT 36.8 (*) 39.0 - 52.0 %   MCV 96.3  78.0 - 100.0 fL   MCH 31.7  26.0 - 34.0 pg   MCHC 32.9  30.0 - 36.0 g/dL   RDW 14.8  11.5 - 15.5 %   Platelets 249  150 - 400 K/uL  BASIC METABOLIC PANEL     Status: Abnormal   Collection Time    08/13/13  2:00 AM      Result Value Ref Range   Sodium 143  137 - 147 mEq/L   Potassium 3.8  3.7 - 5.3 mEq/L   Chloride 95 (*) 96 - 112 mEq/L   CO2 29  19 - 32 mEq/L   Glucose, Bld 107 (*) 70 - 99 mg/dL   BUN 48 (*) 6 - 23 mg/dL   Creatinine, Ser 10.44 (*) 0.50 - 1.35 mg/dL   Calcium 9.8  8.4 - 10.5 mg/dL   GFR calc non Af Amer 4 (*) >90 mL/min   GFR calc Af Amer 5 (*) >90 mL/min   Comment: (NOTE)     The eGFR has been calculated using the CKD EPI equation.     This calculation has not been validated in all clinical situations.     eGFR's persistently <90 mL/min signify possible Chronic Kidney     Disease.   Anion gap 19 (*) 5 - 15  I-STAT TROPOININ, ED     Status: Abnormal   Collection Time    08/13/13  2:07 AM      Result Value Ref Range   Troponin i, poc 0.17 (*) 0.00 - 0.08 ng/mL   Comment NOTIFIED PHYSICIAN     Comment 3            Comment: Due to the release kinetics of cTnI,     a negative result within the first hours     of the onset of symptoms does not rule out     myocardial infarction with certainty.     If myocardial infarction is still suspected,     repeat the test at appropriate intervals.  TROPONIN I     Status: Abnormal   Collection Time    08/13/13  3:12 AM  Result Value Ref Range   Troponin I 0.43 (*) <0.30 ng/mL   Comment:            Due to the release kinetics of cTnI,     a negative result within the first hours     of the onset of symptoms does not  rule out     myocardial infarction with certainty.     If myocardial infarction is still suspected,     repeat the test at appropriate intervals.     CRITICAL RESULT CALLED TO, READ BACK BY AND VERIFIED WITH:     HINSON,D RN 08/13/2013 0353 JORDANS  BASIC METABOLIC PANEL     Status: Abnormal   Collection Time    08/13/13  9:48 AM      Result Value Ref Range   Sodium 141  137 - 147 mEq/L   Potassium 4.2  3.7 - 5.3 mEq/L   Chloride 97  96 - 112 mEq/L   CO2 27  19 - 32 mEq/L   Glucose, Bld 81  70 - 99 mg/dL   BUN 54 (*) 6 - 23 mg/dL   Creatinine, Ser 11.56 (*) 0.50 - 1.35 mg/dL   Calcium 9.2  8.4 - 10.5 mg/dL   GFR calc non Af Amer 4 (*) >90 mL/min   GFR calc Af Amer 5 (*) >90 mL/min   Comment: (NOTE)     The eGFR has been calculated using the CKD EPI equation.     This calculation has not been validated in all clinical situations.     eGFR's persistently <90 mL/min signify possible Chronic Kidney     Disease.   Anion gap 17 (*) 5 - 15  CBC     Status: Abnormal   Collection Time    08/13/13  9:48 AM      Result Value Ref Range   WBC 12.0 (*) 4.0 - 10.5 K/uL   RBC 3.62 (*) 4.22 - 5.81 MIL/uL   Hemoglobin 11.2 (*) 13.0 - 17.0 g/dL   HCT 35.0 (*) 39.0 - 52.0 %   MCV 96.7  78.0 - 100.0 fL   MCH 30.9  26.0 - 34.0 pg   MCHC 32.0  30.0 - 36.0 g/dL   RDW 14.8  11.5 - 15.5 %   Platelets 231  150 - 400 K/uL  I-STAT TROPOININ, ED     Status: Abnormal   Collection Time    08/13/13  9:55 AM      Result Value Ref Range   Troponin i, poc 0.20 (*) 0.00 - 0.08 ng/mL   Comment NOTIFIED PHYSICIAN     Comment 3            Comment: Due to the release kinetics of cTnI,     a negative result within the first hours     of the onset of symptoms does not rule out     myocardial infarction with certainty.     If myocardial infarction is still suspected,     repeat the test at appropriate intervals.  HEPARIN LEVEL (UNFRACTIONATED)     Status: Abnormal   Collection Time    08/13/13 12:24 PM       Result Value Ref Range   Heparin Unfractionated <0.10 (*) 0.30 - 0.70 IU/mL   Comment:            IF HEPARIN RESULTS ARE BELOW     EXPECTED VALUES, AND PATIENT     DOSAGE HAS BEEN CONFIRMED,  SUGGEST FOLLOW UP TESTING     OF ANTITHROMBIN III LEVELS.     REPEATED TO VERIFY  CBG MONITORING, ED     Status: Abnormal   Collection Time    08/13/13 12:27 PM      Result Value Ref Range   Glucose-Capillary 60 (*) 70 - 99 mg/dL  CBG MONITORING, ED     Status: None   Collection Time    08/13/13  1:16 PM      Result Value Ref Range   Glucose-Capillary 96  70 - 99 mg/dL  MRSA PCR SCREENING     Status: None   Collection Time    08/13/13  3:07 PM      Result Value Ref Range   MRSA by PCR NEGATIVE  NEGATIVE   Comment:            The GeneXpert MRSA Assay (FDA     approved for NASAL specimens     only), is one component of a     comprehensive MRSA colonization     surveillance program. It is not     intended to diagnose MRSA     infection nor to guide or     monitor treatment for     MRSA infections.  GLUCOSE, CAPILLARY     Status: None   Collection Time    08/13/13  5:55 PM      Result Value Ref Range   Glucose-Capillary 87  70 - 99 mg/dL  HEPATITIS B SURFACE ANTIGEN     Status: None   Collection Time    08/13/13  6:23 PM      Result Value Ref Range   Hepatitis B Surface Ag NEGATIVE  NEGATIVE   Comment: Performed at [facility] (UNFRACTIONATED)     Status: Abnormal   Collection Time    08/13/13 10:00 PM      Result Value Ref Range   Heparin Unfractionated <0.10 (*) 0.30 - 0.70 IU/mL   Comment:            IF HEPARIN RESULTS ARE BELOW     EXPECTED VALUES, AND PATIENT     DOSAGE HAS BEEN CONFIRMED,     SUGGEST FOLLOW UP TESTING     OF ANTITHROMBIN III LEVELS.  GLUCOSE, CAPILLARY     Status: None   Collection Time    08/13/13 11:35 PM      Result Value Ref Range   Glucose-Capillary 94  70 - 99 mg/dL  CBC     Status: Abnormal   Collection Time     08/14/13  2:50 AM      Result Value Ref Range   WBC 10.1  4.0 - 10.5 K/uL   RBC 3.93 (*) 4.22 - 5.81 MIL/uL   Hemoglobin 12.1 (*) 13.0 - 17.0 g/dL   HCT 37.7 (*) 39.0 - 52.0 %   MCV 95.9  78.0 - 100.0 fL   MCH 30.8  26.0 - 34.0 pg   MCHC 32.1  30.0 - 36.0 g/dL   RDW 14.9  11.5 - 15.5 %   Platelets 258  150 - 400 K/uL  BASIC METABOLIC PANEL     Status: Abnormal   Collection Time    08/14/13  4:31 AM      Result Value Ref Range   Sodium 143  137 - 147 mEq/L   Potassium 3.9  3.7 - 5.3 mEq/L   Chloride 102  96 - 112 mEq/L   CO2  28  19 - 32 mEq/L   Glucose, Bld 94  70 - 99 mg/dL   BUN 23  6 - 23 mg/dL   Comment: DELTA CHECK NOTED   Creatinine, Ser 5.92 (*) 0.50 - 1.35 mg/dL   Comment: DELTA CHECK NOTED   Calcium 9.3  8.4 - 10.5 mg/dL   GFR calc non Af Amer 9 (*) >90 mL/min   GFR calc Af Amer 10 (*) >90 mL/min   Comment: (NOTE)     The eGFR has been calculated using the CKD EPI equation.     This calculation has not been validated in all clinical situations.     eGFR's persistently <90 mL/min signify possible Chronic Kidney     Disease.   Anion gap 13  5 - 15  PROTIME-INR     Status: None   Collection Time    08/14/13  4:31 AM      Result Value Ref Range   Prothrombin Time 13.8  11.6 - 15.2 seconds   INR 1.06  0.00 - 1.49  HEPARIN LEVEL (UNFRACTIONATED)     Status: Abnormal   Collection Time    08/14/13  7:25 AM      Result Value Ref Range   Heparin Unfractionated <0.10 (*) 0.30 - 0.70 IU/mL   Comment:            IF HEPARIN RESULTS ARE BELOW     EXPECTED VALUES, AND PATIENT     DOSAGE HAS BEEN CONFIRMED,     SUGGEST FOLLOW UP TESTING     OF ANTITHROMBIN III LEVELS.  GLUCOSE, CAPILLARY     Status: Abnormal   Collection Time    08/14/13  8:20 AM      Result Value Ref Range   Glucose-Capillary 100 (*) 70 - 99 mg/dL  POCT ACTIVATED CLOTTING TIME     Status: None   Collection Time    08/14/13  2:26 PM      Result Value Ref Range   Activated Clotting Time 129      GLUCOSE, CAPILLARY     Status: None   Collection Time    08/14/13  2:48 PM      Result Value Ref Range   Glucose-Capillary 83  70 - 99 mg/dL  GLUCOSE, CAPILLARY     Status: Abnormal   Collection Time    08/14/13  5:31 PM      Result Value Ref Range   Glucose-Capillary 126 (*) 70 - 99 mg/dL    Dg Chest Portable 1 View  08/13/2013   CLINICAL DATA:  Chest pain  EXAM: PORTABLE CHEST - 1 VIEW  COMPARISON:  04/21/2013  FINDINGS: Mild bibasilar atelectasis/infiltrate. Negative for heart failure or effusion. Heart size upper normal. Negative for mass lesion.  IMPRESSION: Mild bibasilar atelectasis/ infiltrate. Resolution of congestive heart failure since the prior study.   Electronically Signed   By: Franchot Gallo M.D.   On: 08/13/2013 02:41    Review of Systems  Constitutional: Positive for malaise/fatigue. Negative for fever, chills and weight loss.  HENT: Negative.   Eyes: Negative.   Respiratory: Positive for shortness of breath. Negative for cough and sputum production.   Cardiovascular: Positive for chest pain. Negative for palpitations, orthopnea, leg swelling and PND.  Gastrointestinal: Negative.   Genitourinary: Negative.   Musculoskeletal: Negative.   Skin: Negative.   Neurological: Negative.   Endo/Heme/Allergies: Negative.   Psychiatric/Behavioral: Negative.    Blood pressure 136/106, pulse 92, temperature 97.6 F (36.4 C), temperature  source Oral, resp. rate 22, height 5' 6"  (1.676 m), weight 71.4 kg (157 lb 6.5 oz), SpO2 99.00%. Physical Exam  Constitutional: He is oriented to person, place, and time. He appears well-developed and well-nourished. No distress.  HENT:  Head: Normocephalic and atraumatic.  Mouth/Throat: Oropharynx is clear and moist.  Eyes: EOM are normal. Pupils are equal, round, and reactive to light.  Neck: Normal range of motion. Neck supple. No JVD present. No thyromegaly present.  Cardiovascular: Normal rate, regular rhythm, normal heart sounds and  intact distal pulses.  Exam reveals no gallop and no friction rub.   No murmur heard. Respiratory: Effort normal and breath sounds normal. No respiratory distress. He has no wheezes. He has no rales.  GI: Soft. Bowel sounds are normal. He exhibits no distension and no mass. There is no tenderness.  Musculoskeletal: Normal range of motion. He exhibits no edema.  Lymphadenopathy:    He has no cervical adenopathy.  Neurological: He is alert and oriented to person, place, and time. He has normal strength. No cranial nerve deficit or sensory deficit.  Skin: Skin is warm and dry.  Psychiatric: He has a normal mood and affect.    Cardiac Catheterization Procedure Note  Name: Dijon Cosens  MRN: [id number]  DOB: 25-May-1945  Procedure: Left Heart Cath, Selective Coronary Angiography, LV angiography  Indication: Non-ST elevation myocardial infarction  Medications:  Sedation: 1 mg IV Versed, 25 mcg IV Fentanyl  Contrast: 70 ml Omnipaque  Procedural details: The right groin was prepped, draped, and anesthetized with 1% lidocaine. Using modified Seldinger technique, a 5 French sheath was introduced into the right femoral artery. Standard Judkins catheters were used for coronary angiography and left ventriculography. Catheter exchanges were performed over a guidewire. There were no immediate procedural complications. The patient was transferred to the post catheterization recovery area for further monitoring.  Procedural Findings:  Hemodynamics:  AO: 104/59 mmHg  LV: 102/2 mmHg  LVEDP: 5 mmHg  Coronary angiography:  Coronary dominance: Right  Left Main: Normal  Left Anterior Descending (LAD): Normal in size and moderately calcified proximally. There is a complex 80% proximal disease at the bifurcation of diagonal branch. The rest of the vessel has minor irregularities.  1st diagonal (D1): Small in size with 50% ostial stenosis.  2nd diagonal (D2): Large in size with 60-70% proximal stenosis.  3rd  diagonal (D3): Medium in size with no significant disease.  Circumflex (LCx): Normal in size and nondominant. The vessel is mildly calcified. There is 80% stenosis in the midsegment.  1st obtuse marginal: Small in size with minor irregularities.  2nd obtuse marginal: large in size with 80% proximal stenosis.  3rd obtuse marginal: minor irregularities.  Right Coronary Artery: very large in size and dominant. There is a 99% proximal stenosis. There is a 40% mid stenosis. The rest of the vessel has minor irregularities.  Posterior descending artery: very large in size with no significant disease.  Posterior AV segment: normal in size with 60% diffuse disease supplying a small area  Left ventriculography: Left ventricular systolic function is mildly reduced , LVEF is estimated at 40 %, there is no significant mitral regurgitation  Final Conclusions:  1. Severe three-vessel coronary artery disease.  2. Mildly reduced LV systolic function.  3. Normal left ventricular end-diastolic pressure.  Recommendations:  the culprit for non-ST elevation myocardial infarction is the right coronary artery. However, the patient has underlying three-vessel disease. He is diabetic, with LV systolic dysfunction and on dialysis. Thus, he will probably  do better with CABG than multivessel PCI. He also appears on the monitor that he might be in atrial flutter and might require anticoagulation. I ordered an EKG. I requested cardiothoracic consult for CABG. Resume anticoagulation with heparin 8 hours after sheath pull until CABG.  Kathlyn Sacramento MD, [facility]  08/14/2013, 2:30 PM    Assessment/Plan:  He has severe 3-vessel coronary disease with mild LV dysfunction and acute on chronic systolic and diastolic congestive heart failure presenting with NSTEMI. His high grade RCA stenosis appears to be the culprit but he also has a complex calcified proximal LAD stenosis at the bifurcation of a diagonal branch and 80% mid LCX and OM  stenoses. With DM and renal failure I agree that CABG is the best treatment for him. He had moderate MR by echo in April but I don't hear a murmur today and he did not have any significant MR at cath. He is scheduled for a repeat echo.  He is scheduled for HD tomorrow and will have preop carotid dopplers and PFT's. We will schedule surgery as soon as there is an opening in the schedule.  Frans Valente K 08/14/2013, 8:10 PM

## 2013-08-14 NOTE — Progress Notes (Signed)
DAILY PROGRESS NOTE  Subjective:  No chest pain complaints. Dialyzed yesterday. He had a run of sustained VT at 4:50 this am (30-40 beats) which self-terminated. Troponin mildly elevated. Plan for LHC today.  Objective:  Temp:  [97.8 F (36.6 C)-99.1 F (37.3 C)] 98.5 F (36.9 C) (08/05 1151) Pulse Rate:  [78-109] 86 (08/05 1151) Resp:  [13-28] 20 (08/05 0301) BP: (96-145)/(40-87) 133/87 mmHg (08/05 1151) SpO2:  [90 %-99 %] 98 % (08/05 1151) Weight:  [157 lb 6.5 oz (71.4 kg)-167 lb 12.3 oz (76.1 kg)] 157 lb 6.5 oz (71.4 kg) (08/04 2225) Weight change: 12.3 oz (0.349 kg)  Intake/Output from previous day: 08/04 0701 - 08/05 0700 In: 596.9 [P.O.:400; I.V.:196.9] Out: 2111 [Urine:100]  Intake/Output from this shift:    Medications: Current Facility-Administered Medications  Medication Dose Route Frequency Provider Last Rate Last Dose  . 0.9 %  sodium chloride infusion  100 mL Intravenous PRN Myriam Jacobson, PA-C      . 0.9 %  sodium chloride infusion  100 mL Intravenous PRN Myriam Jacobson, PA-C      . 0.9 %  sodium chloride infusion  250 mL Intravenous PRN Rhonda G [name], PA-C      . 0.9 %  sodium chloride infusion   Intravenous Continuous Rhonda G [name], PA-C 10 mL/hr at 08/14/13 0645    . acetaminophen (TYLENOL) tablet 650 mg  650 mg Oral Q4H PRN Grafton Folk, MD      . amLODipine (NORVASC) tablet 5 mg  5 mg Oral Daily Grafton Folk, MD   5 mg at 08/14/13 0909  . aspirin EC tablet 81 mg  81 mg Oral Daily Grafton Folk, MD   81 mg at 08/14/13 0909  . atorvastatin (LIPITOR) tablet 20 mg  20 mg Oral q morning - 10a Grafton Folk, MD   20 mg at 08/14/13 0909  . calcitRIOL (ROCALTROL) capsule 0.5 mcg  0.5 mcg Oral Q T,Th,Sa-HD Myriam Jacobson, PA-C      . feeding supplement (NEPRO CARB STEADY) liquid 237 mL  237 mL Oral PRN Myriam Jacobson, PA-C      . Derrill Memo ON 08/15/2013] ferric gluconate (NULECIT) 62.5 mg in sodium chloride 0.9 % 100 mL IVPB  62.5 mg Intravenous  Q Thu-HD Myriam Jacobson, PA-C      . heparin ADULT infusion 100 units/mL (25000 units/250 mL)  1,650 Units/hr Intravenous Continuous Dareen Piano, RPH 16.5 mL/hr at 08/14/13 1000 1,650 Units/hr at 08/14/13 1000  . heparin injection 1,000 Units  1,000 Units Dialysis PRN Myriam Jacobson, PA-C      . heparin injection 1,500 Units  20 Units/kg Dialysis PRN Myriam Jacobson, PA-C      . insulin aspart (novoLOG) injection 0-5 Units  0-5 Units Subcutaneous QHS Mian A Yousuf, MD      . insulin aspart (novoLOG) injection 0-9 Units  0-9 Units Subcutaneous TID WC Mian A Yousuf, MD      . lidocaine (PF) (XYLOCAINE) 1 % injection 5 mL  5 mL Intradermal PRN Myriam Jacobson, PA-C      . lidocaine-prilocaine (EMLA) cream 1 application  1 application Topical PRN Myriam Jacobson, PA-C      . metoprolol tartrate (LOPRESSOR) tablet 12.5 mg  12.5 mg Oral BID Grafton Folk, MD   12.5 mg at 08/14/13 0909  . morphine 4 MG/ML injection 4 mg  4 mg Intravenous Once Delora Fuel, MD      .  multivitamin (RENA-VIT) tablet 1 tablet  1 tablet Oral QHS Myriam Jacobson, PA-C      . nitroGLYCERIN (NITROSTAT) SL tablet 0.4 mg  0.4 mg Sublingual Q5 Min x 3 PRN Grafton Folk, MD      . ondansetron (ZOFRAN) injection 4 mg  4 mg Intravenous Q6H PRN Grafton Folk, MD      . pentafluoroprop-tetrafluoroeth (GEBAUERS) aerosol 1 application  1 application Topical PRN Myriam Jacobson, PA-C      . sevelamer carbonate (RENVELA) tablet 1,600 mg  1,600 mg Oral TID WC Myriam Jacobson, PA-C      . sodium chloride 0.9 % injection 3 mL  3 mL Intravenous Q12H Rhonda G [name], PA-C      . sodium chloride 0.9 % injection 3 mL  3 mL Intravenous PRN Rhonda G [name], PA-C      . 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, +thrill Pulses: 2+ and symmetric  Lab Results: Results for orders placed during the hospital encounter of 08/13/13 (from the past 48 hour(s))  CBC     Status: Abnormal   Collection Time    08/13/13  2:00 AM      Result Value Ref Range   WBC 13.5 (*) 4.0 - 10.5 K/uL   RBC 3.82 (*) 4.22 - 5.81 MIL/uL   Hemoglobin 12.1 (*) 13.0 - 17.0 g/dL   HCT 36.8 (*) 39.0 - 52.0 %   MCV 96.3  78.0 - 100.0 fL   MCH 31.7  26.0 - 34.0 pg   MCHC 32.9  30.0 - 36.0 g/dL   RDW 14.8  11.5 - 15.5 %   Platelets 249  150 - 400 K/uL  BASIC METABOLIC PANEL     Status: Abnormal   Collection Time    08/13/13  2:00 AM      Result Value Ref Range   Sodium 143  137 - 147 mEq/L   Potassium 3.8  3.7 - 5.3 mEq/L   Chloride 95 (*) 96 - 112 mEq/L   CO2 29  19 - 32 mEq/L   Glucose, Bld 107 (*) 70 - 99 mg/dL   BUN 48 (*) 6 - 23 mg/dL   Creatinine, Ser 10.44 (*) 0.50 - 1.35 mg/dL   Calcium 9.8  8.4 - 10.5 mg/dL   GFR calc non Af Amer 4 (*) >90 mL/min   GFR calc Af Amer 5 (*) >90 mL/min   Comment: (NOTE)     The eGFR has been calculated using the CKD EPI equation.     This calculation has not been validated in all clinical situations.     eGFR's persistently <90 mL/min signify possible Chronic Kidney     Disease.   Anion gap 19 (*) 5 - 15  I-STAT TROPOININ, ED     Status: Abnormal   Collection Time    08/13/13  2:07 AM      Result Value Ref Range   Troponin i, poc 0.17 (*) 0.00 - 0.08 ng/mL   Comment NOTIFIED PHYSICIAN     Comment 3            Comment: Due to the  release kinetics of cTnI,     a negative result within the first hours     of the onset of symptoms does not rule out     myocardial infarction with certainty.     If myocardial infarction is still suspected,     repeat the test at appropriate intervals.  TROPONIN I     Status: Abnormal   Collection Time    08/13/13  3:12 AM       Result Value Ref Range   Troponin I 0.43 (*) <0.30 ng/mL   Comment:            Due to the release kinetics of cTnI,     a negative result within the first hours     of the onset of symptoms does not rule out     myocardial infarction with certainty.     If myocardial infarction is still suspected,     repeat the test at appropriate intervals.     CRITICAL RESULT CALLED TO, READ BACK BY AND VERIFIED WITH:     HINSON,D RN 08/13/2013 0353 JORDANS  BASIC METABOLIC PANEL     Status: Abnormal   Collection Time    08/13/13  9:48 AM      Result Value Ref Range   Sodium 141  137 - 147 mEq/L   Potassium 4.2  3.7 - 5.3 mEq/L   Chloride 97  96 - 112 mEq/L   CO2 27  19 - 32 mEq/L   Glucose, Bld 81  70 - 99 mg/dL   BUN 54 (*) 6 - 23 mg/dL   Creatinine, Ser 11.56 (*) 0.50 - 1.35 mg/dL   Calcium 9.2  8.4 - 10.5 mg/dL   GFR calc non Af Amer 4 (*) >90 mL/min   GFR calc Af Amer 5 (*) >90 mL/min   Comment: (NOTE)     The eGFR has been calculated using the CKD EPI equation.     This calculation has not been validated in all clinical situations.     eGFR's persistently <90 mL/min signify possible Chronic Kidney     Disease.   Anion gap 17 (*) 5 - 15  CBC     Status: Abnormal   Collection Time    08/13/13  9:48 AM      Result Value Ref Range   WBC 12.0 (*) 4.0 - 10.5 K/uL   RBC 3.62 (*) 4.22 - 5.81 MIL/uL   Hemoglobin 11.2 (*) 13.0 - 17.0 g/dL   HCT 35.0 (*) 39.0 - 52.0 %   MCV 96.7  78.0 - 100.0 fL   MCH 30.9  26.0 - 34.0 pg   MCHC 32.0  30.0 - 36.0 g/dL   RDW 14.8  11.5 - 15.5 %   Platelets 231  150 - 400 K/uL  I-STAT TROPOININ, ED     Status: Abnormal   Collection Time    08/13/13  9:55 AM      Result Value Ref Range   Troponin i, poc 0.20 (*) 0.00 - 0.08 ng/mL   Comment NOTIFIED PHYSICIAN     Comment 3            Comment: Due to the release kinetics of cTnI,     a negative result within the first hours     of the onset of symptoms does not rule out     myocardial infarction with  certainty.     If myocardial infarction is still suspected,  repeat the test at appropriate intervals.  HEPARIN LEVEL (UNFRACTIONATED)     Status: Abnormal   Collection Time    08/13/13 12:24 PM      Result Value Ref Range   Heparin Unfractionated <0.10 (*) 0.30 - 0.70 IU/mL   Comment:            IF HEPARIN RESULTS ARE BELOW     EXPECTED VALUES, AND PATIENT     DOSAGE HAS BEEN CONFIRMED,     SUGGEST FOLLOW UP TESTING     OF ANTITHROMBIN III LEVELS.     REPEATED TO VERIFY  CBG MONITORING, ED     Status: Abnormal   Collection Time    08/13/13 12:27 PM      Result Value Ref Range   Glucose-Capillary 60 (*) 70 - 99 mg/dL  CBG MONITORING, ED     Status: None   Collection Time    08/13/13  1:16 PM      Result Value Ref Range   Glucose-Capillary 96  70 - 99 mg/dL  MRSA PCR SCREENING     Status: None   Collection Time    08/13/13  3:07 PM      Result Value Ref Range   MRSA by PCR NEGATIVE  NEGATIVE   Comment:            The GeneXpert MRSA Assay (FDA     approved for NASAL specimens     only), is one component of a     comprehensive MRSA colonization     surveillance program. It is not     intended to diagnose MRSA     infection nor to guide or     monitor treatment for     MRSA infections.  GLUCOSE, CAPILLARY     Status: None   Collection Time    08/13/13  5:55 PM      Result Value Ref Range   Glucose-Capillary 87  70 - 99 mg/dL  HEPATITIS B SURFACE ANTIGEN     Status: None   Collection Time    08/13/13  6:23 PM      Result Value Ref Range   Hepatitis B Surface Ag NEGATIVE  NEGATIVE   Comment: Performed at [facility] [place] (UNFRACTIONATED)     Status: Abnormal   Collection Time    08/13/13 10:00 PM      Result Value Ref Range   Heparin Unfractionated <0.10 (*) 0.30 - 0.70 IU/mL   Comment:            IF HEPARIN RESULTS ARE BELOW     EXPECTED VALUES, AND PATIENT     DOSAGE HAS BEEN CONFIRMED,     SUGGEST FOLLOW UP TESTING     OF ANTITHROMBIN  III LEVELS.  GLUCOSE, CAPILLARY     Status: None   Collection Time    08/13/13 11:35 PM      Result Value Ref Range   Glucose-Capillary 94  70 - 99 mg/dL  CBC     Status: Abnormal   Collection Time    08/14/13  2:50 AM      Result Value Ref Range   WBC 10.1  4.0 - 10.5 K/uL   RBC 3.93 (*) 4.22 - 5.81 MIL/uL   Hemoglobin 12.1 (*) 13.0 - 17.0 g/dL   HCT 37.7 (*) 39.0 - 52.0 %   MCV 95.9  78.0 - 100.0 fL   MCH 30.8  26.0 - 34.0 pg   MCHC  32.1  30.0 - 36.0 g/dL   RDW 14.9  11.5 - 15.5 %   Platelets 258  150 - 400 K/uL  BASIC METABOLIC PANEL     Status: Abnormal   Collection Time    08/14/13  4:31 AM      Result Value Ref Range   Sodium 143  137 - 147 mEq/L   Potassium 3.9  3.7 - 5.3 mEq/L   Chloride 102  96 - 112 mEq/L   CO2 28  19 - 32 mEq/L   Glucose, Bld 94  70 - 99 mg/dL   BUN 23  6 - 23 mg/dL   Comment: DELTA CHECK NOTED   Creatinine, Ser 5.92 (*) 0.50 - 1.35 mg/dL   Comment: DELTA CHECK NOTED   Calcium 9.3  8.4 - 10.5 mg/dL   GFR calc non Af Amer 9 (*) >90 mL/min   GFR calc Af Amer 10 (*) >90 mL/min   Comment: (NOTE)     The eGFR has been calculated using the CKD EPI equation.     This calculation has not been validated in all clinical situations.     eGFR's persistently <90 mL/min signify possible Chronic Kidney     Disease.   Anion gap 13  5 - 15  PROTIME-INR     Status: None   Collection Time    08/14/13  4:31 AM      Result Value Ref Range   Prothrombin Time 13.8  11.6 - 15.2 seconds   INR 1.06  0.00 - 1.49  HEPARIN LEVEL (UNFRACTIONATED)     Status: Abnormal   Collection Time    08/14/13  7:25 AM      Result Value Ref Range   Heparin Unfractionated <0.10 (*) 0.30 - 0.70 IU/mL   Comment:            IF HEPARIN RESULTS ARE BELOW     EXPECTED VALUES, AND PATIENT     DOSAGE HAS BEEN CONFIRMED,     SUGGEST FOLLOW UP TESTING     OF ANTITHROMBIN III LEVELS.  GLUCOSE, CAPILLARY     Status: Abnormal   Collection Time    08/14/13  8:20 AM      Result Value Ref  Range   Glucose-Capillary 100 (*) 70 - 99 mg/dL    Imaging: Dg Chest Portable 1 View  08/13/2013   CLINICAL DATA:  Chest pain  EXAM: PORTABLE CHEST - 1 VIEW  COMPARISON:  04/21/2013  FINDINGS: Mild bibasilar atelectasis/infiltrate. Negative for heart failure or effusion. Heart size upper normal. Negative for mass lesion.  IMPRESSION: Mild bibasilar atelectasis/ infiltrate. Resolution of congestive heart failure since the prior study.   Electronically Signed   By: Franchot Gallo M.D.   On: 08/13/2013 02:41    Assessment:  Principal Problem:   NSTEMI (non-ST elevated myocardial infarction) Active Problems:   ESRD on dialysis   Anemia   Systolic and diastolic CHF, acute on chronic   Sustained VT (ventricular tachycardia)   Plan:  Plan for LHC today. He is agreeable to proceed. Would recommend increase in b-blocker to 25 mg BID given sustained VT. Give extra 12.5 mg dose now.  Time Spent Directly with Patient:  15 minutes  Length of Stay:  LOS: 1 day   Pixie Casino, MD, [facility] Attending Cardiologist [facility]  [name] Senk C 08/14/2013, 12:55 PM

## 2013-08-14 NOTE — H&P (View-Only) (Signed)
DAILY PROGRESS NOTE  Subjective:  No chest pain complaints. Dialyzed yesterday. He had a run of sustained VT at 4:50 this am (30-40 beats) which self-terminated. Troponin mildly elevated. Plan for LHC today.  Objective:  Temp:  [97.8 F (36.6 C)-99.1 F (37.3 C)] 98.5 F (36.9 C) (08/05 1151) Pulse Rate:  [78-109] 86 (08/05 1151) Resp:  [13-28] 20 (08/05 0301) BP: (96-145)/(40-87) 133/87 mmHg (08/05 1151) SpO2:  [90 %-99 %] 98 % (08/05 1151) Weight:  [157 lb 6.5 oz (71.4 kg)-167 lb 12.3 oz (76.1 kg)] 157 lb 6.5 oz (71.4 kg) (08/04 2225) Weight change: 12.3 oz (0.349 kg)  Intake/Output from previous day: 08/04 0701 - 08/05 0700 In: 596.9 [P.O.:400; I.V.:196.9] Out: 2111 [Urine:100]  Intake/Output from this shift:    Medications: Current Facility-Administered Medications  Medication Dose Route Frequency Provider Last Rate Last Dose  . 0.9 %  sodium chloride infusion  100 mL Intravenous PRN Myriam Jacobson, PA-C      . 0.9 %  sodium chloride infusion  100 mL Intravenous PRN Myriam Jacobson, PA-C      . 0.9 %  sodium chloride infusion  250 mL Intravenous PRN Rhonda G [name], PA-C      . 0.9 %  sodium chloride infusion   Intravenous Continuous Rhonda G [name], PA-C 10 mL/hr at 08/14/13 0645    . acetaminophen (TYLENOL) tablet 650 mg  650 mg Oral Q4H PRN Grafton Folk, MD      . amLODipine (NORVASC) tablet 5 mg  5 mg Oral Daily Grafton Folk, MD   5 mg at 08/14/13 0909  . aspirin EC tablet 81 mg  81 mg Oral Daily Grafton Folk, MD   81 mg at 08/14/13 0909  . atorvastatin (LIPITOR) tablet 20 mg  20 mg Oral q morning - 10a Grafton Folk, MD   20 mg at 08/14/13 0909  . calcitRIOL (ROCALTROL) capsule 0.5 mcg  0.5 mcg Oral Q T,Th,Sa-HD Myriam Jacobson, PA-C      . feeding supplement (NEPRO CARB STEADY) liquid 237 mL  237 mL Oral PRN Myriam Jacobson, PA-C      . Derrill Memo ON 08/15/2013] ferric gluconate (NULECIT) 62.5 mg in sodium chloride 0.9 % 100 mL IVPB  62.5 mg Intravenous  Q Thu-HD Myriam Jacobson, PA-C      . heparin ADULT infusion 100 units/mL (25000 units/250 mL)  1,650 Units/hr Intravenous Continuous Dareen Piano, RPH 16.5 mL/hr at 08/14/13 1000 1,650 Units/hr at 08/14/13 1000  . heparin injection 1,000 Units  1,000 Units Dialysis PRN Myriam Jacobson, PA-C      . heparin injection 1,500 Units  20 Units/kg Dialysis PRN Myriam Jacobson, PA-C      . insulin aspart (novoLOG) injection 0-5 Units  0-5 Units Subcutaneous QHS Mian A Yousuf, MD      . insulin aspart (novoLOG) injection 0-9 Units  0-9 Units Subcutaneous TID WC Mian A Yousuf, MD      . lidocaine (PF) (XYLOCAINE) 1 % injection 5 mL  5 mL Intradermal PRN Myriam Jacobson, PA-C      . lidocaine-prilocaine (EMLA) cream 1 application  1 application Topical PRN Myriam Jacobson, PA-C      . metoprolol tartrate (LOPRESSOR) tablet 12.5 mg  12.5 mg Oral BID Grafton Folk, MD   12.5 mg at 08/14/13 0909  . morphine 4 MG/ML injection 4 mg  4 mg Intravenous Once Delora Fuel, MD      .  multivitamin (RENA-VIT) tablet 1 tablet  1 tablet Oral QHS Myriam Jacobson, PA-C      . nitroGLYCERIN (NITROSTAT) SL tablet 0.4 mg  0.4 mg Sublingual Q5 Min x 3 PRN Grafton Folk, MD      . ondansetron (ZOFRAN) injection 4 mg  4 mg Intravenous Q6H PRN Grafton Folk, MD      . pentafluoroprop-tetrafluoroeth (GEBAUERS) aerosol 1 application  1 application Topical PRN Myriam Jacobson, PA-C      . sevelamer carbonate (RENVELA) tablet 1,600 mg  1,600 mg Oral TID WC Myriam Jacobson, PA-C      . sodium chloride 0.9 % injection 3 mL  3 mL Intravenous Q12H Rhonda G [name], PA-C      . sodium chloride 0.9 % injection 3 mL  3 mL Intravenous PRN Rhonda G [name], PA-C      . 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, +thrill Pulses: 2+ and symmetric  Lab Results: Results for orders placed during the hospital encounter of 08/13/13 (from the past 48 hour(s))  CBC     Status: Abnormal   Collection Time    08/13/13  2:00 AM      Result Value Ref Range   WBC 13.5 (*) 4.0 - 10.5 K/uL   RBC 3.82 (*) 4.22 - 5.81 MIL/uL   Hemoglobin 12.1 (*) 13.0 - 17.0 g/dL   HCT 36.8 (*) 39.0 - 52.0 %   MCV 96.3  78.0 - 100.0 fL   MCH 31.7  26.0 - 34.0 pg   MCHC 32.9  30.0 - 36.0 g/dL   RDW 14.8  11.5 - 15.5 %   Platelets 249  150 - 400 K/uL  BASIC METABOLIC PANEL     Status: Abnormal   Collection Time    08/13/13  2:00 AM      Result Value Ref Range   Sodium 143  137 - 147 mEq/L   Potassium 3.8  3.7 - 5.3 mEq/L   Chloride 95 (*) 96 - 112 mEq/L   CO2 29  19 - 32 mEq/L   Glucose, Bld 107 (*) 70 - 99 mg/dL   BUN 48 (*) 6 - 23 mg/dL   Creatinine, Ser 10.44 (*) 0.50 - 1.35 mg/dL   Calcium 9.8  8.4 - 10.5 mg/dL   GFR calc non Af Amer 4 (*) >90 mL/min   GFR calc Af Amer 5 (*) >90 mL/min   Comment: (NOTE)     The eGFR has been calculated using the CKD EPI equation.     This calculation has not been validated in all clinical situations.     eGFR's persistently <90 mL/min signify possible Chronic Kidney     Disease.   Anion gap 19 (*) 5 - 15  I-STAT TROPOININ, ED     Status: Abnormal   Collection Time    08/13/13  2:07 AM      Result Value Ref Range   Troponin i, poc 0.17 (*) 0.00 - 0.08 ng/mL   Comment NOTIFIED PHYSICIAN     Comment 3            Comment: Due to the  release kinetics of cTnI,     a negative result within the first hours     of the onset of symptoms does not rule out     myocardial infarction with certainty.     If myocardial infarction is still suspected,     repeat the test at appropriate intervals.  TROPONIN I     Status: Abnormal   Collection Time    08/13/13  3:12 AM       Result Value Ref Range   Troponin I 0.43 (*) <0.30 ng/mL   Comment:            Due to the release kinetics of cTnI,     a negative result within the first hours     of the onset of symptoms does not rule out     myocardial infarction with certainty.     If myocardial infarction is still suspected,     repeat the test at appropriate intervals.     CRITICAL RESULT CALLED TO, READ BACK BY AND VERIFIED WITH:     HINSON,D RN 08/13/2013 0353 JORDANS  BASIC METABOLIC PANEL     Status: Abnormal   Collection Time    08/13/13  9:48 AM      Result Value Ref Range   Sodium 141  137 - 147 mEq/L   Potassium 4.2  3.7 - 5.3 mEq/L   Chloride 97  96 - 112 mEq/L   CO2 27  19 - 32 mEq/L   Glucose, Bld 81  70 - 99 mg/dL   BUN 54 (*) 6 - 23 mg/dL   Creatinine, Ser 11.56 (*) 0.50 - 1.35 mg/dL   Calcium 9.2  8.4 - 10.5 mg/dL   GFR calc non Af Amer 4 (*) >90 mL/min   GFR calc Af Amer 5 (*) >90 mL/min   Comment: (NOTE)     The eGFR has been calculated using the CKD EPI equation.     This calculation has not been validated in all clinical situations.     eGFR's persistently <90 mL/min signify possible Chronic Kidney     Disease.   Anion gap 17 (*) 5 - 15  CBC     Status: Abnormal   Collection Time    08/13/13  9:48 AM      Result Value Ref Range   WBC 12.0 (*) 4.0 - 10.5 K/uL   RBC 3.62 (*) 4.22 - 5.81 MIL/uL   Hemoglobin 11.2 (*) 13.0 - 17.0 g/dL   HCT 35.0 (*) 39.0 - 52.0 %   MCV 96.7  78.0 - 100.0 fL   MCH 30.9  26.0 - 34.0 pg   MCHC 32.0  30.0 - 36.0 g/dL   RDW 14.8  11.5 - 15.5 %   Platelets 231  150 - 400 K/uL  I-STAT TROPOININ, ED     Status: Abnormal   Collection Time    08/13/13  9:55 AM      Result Value Ref Range   Troponin i, poc 0.20 (*) 0.00 - 0.08 ng/mL   Comment NOTIFIED PHYSICIAN     Comment 3            Comment: Due to the release kinetics of cTnI,     a negative result within the first hours     of the onset of symptoms does not rule out     myocardial infarction with  certainty.     If myocardial infarction is still suspected,  repeat the test at appropriate intervals.  HEPARIN LEVEL (UNFRACTIONATED)     Status: Abnormal   Collection Time    08/13/13 12:24 PM      Result Value Ref Range   Heparin Unfractionated <0.10 (*) 0.30 - 0.70 IU/mL   Comment:            IF HEPARIN RESULTS ARE BELOW     EXPECTED VALUES, AND PATIENT     DOSAGE HAS BEEN CONFIRMED,     SUGGEST FOLLOW UP TESTING     OF ANTITHROMBIN III LEVELS.     REPEATED TO VERIFY  CBG MONITORING, ED     Status: Abnormal   Collection Time    08/13/13 12:27 PM      Result Value Ref Range   Glucose-Capillary 60 (*) 70 - 99 mg/dL  CBG MONITORING, ED     Status: None   Collection Time    08/13/13  1:16 PM      Result Value Ref Range   Glucose-Capillary 96  70 - 99 mg/dL  MRSA PCR SCREENING     Status: None   Collection Time    08/13/13  3:07 PM      Result Value Ref Range   MRSA by PCR NEGATIVE  NEGATIVE   Comment:            The GeneXpert MRSA Assay (FDA     approved for NASAL specimens     only), is one component of a     comprehensive MRSA colonization     surveillance program. It is not     intended to diagnose MRSA     infection nor to guide or     monitor treatment for     MRSA infections.  GLUCOSE, CAPILLARY     Status: None   Collection Time    08/13/13  5:55 PM      Result Value Ref Range   Glucose-Capillary 87  70 - 99 mg/dL  HEPATITIS B SURFACE ANTIGEN     Status: None   Collection Time    08/13/13  6:23 PM      Result Value Ref Range   Hepatitis B Surface Ag NEGATIVE  NEGATIVE   Comment: Performed at [facility] (UNFRACTIONATED)     Status: Abnormal   Collection Time    08/13/13 10:00 PM      Result Value Ref Range   Heparin Unfractionated <0.10 (*) 0.30 - 0.70 IU/mL   Comment:            IF HEPARIN RESULTS ARE BELOW     EXPECTED VALUES, AND PATIENT     DOSAGE HAS BEEN CONFIRMED,     SUGGEST FOLLOW UP TESTING     OF ANTITHROMBIN  III LEVELS.  GLUCOSE, CAPILLARY     Status: None   Collection Time    08/13/13 11:35 PM      Result Value Ref Range   Glucose-Capillary 94  70 - 99 mg/dL  CBC     Status: Abnormal   Collection Time    08/14/13  2:50 AM      Result Value Ref Range   WBC 10.1  4.0 - 10.5 K/uL   RBC 3.93 (*) 4.22 - 5.81 MIL/uL   Hemoglobin 12.1 (*) 13.0 - 17.0 g/dL   HCT 37.7 (*) 39.0 - 52.0 %   MCV 95.9  78.0 - 100.0 fL   MCH 30.8  26.0 - 34.0 pg   MCHC  32.1  30.0 - 36.0 g/dL   RDW 14.9  11.5 - 15.5 %   Platelets 258  150 - 400 K/uL  BASIC METABOLIC PANEL     Status: Abnormal   Collection Time    08/14/13  4:31 AM      Result Value Ref Range   Sodium 143  137 - 147 mEq/L   Potassium 3.9  3.7 - 5.3 mEq/L   Chloride 102  96 - 112 mEq/L   CO2 28  19 - 32 mEq/L   Glucose, Bld 94  70 - 99 mg/dL   BUN 23  6 - 23 mg/dL   Comment: DELTA CHECK NOTED   Creatinine, Ser 5.92 (*) 0.50 - 1.35 mg/dL   Comment: DELTA CHECK NOTED   Calcium 9.3  8.4 - 10.5 mg/dL   GFR calc non Af Amer 9 (*) >90 mL/min   GFR calc Af Amer 10 (*) >90 mL/min   Comment: (NOTE)     The eGFR has been calculated using the CKD EPI equation.     This calculation has not been validated in all clinical situations.     eGFR's persistently <90 mL/min signify possible Chronic Kidney     Disease.   Anion gap 13  5 - 15  PROTIME-INR     Status: None   Collection Time    08/14/13  4:31 AM      Result Value Ref Range   Prothrombin Time 13.8  11.6 - 15.2 seconds   INR 1.06  0.00 - 1.49  HEPARIN LEVEL (UNFRACTIONATED)     Status: Abnormal   Collection Time    08/14/13  7:25 AM      Result Value Ref Range   Heparin Unfractionated <0.10 (*) 0.30 - 0.70 IU/mL   Comment:            IF HEPARIN RESULTS ARE BELOW     EXPECTED VALUES, AND PATIENT     DOSAGE HAS BEEN CONFIRMED,     SUGGEST FOLLOW UP TESTING     OF ANTITHROMBIN III LEVELS.  GLUCOSE, CAPILLARY     Status: Abnormal   Collection Time    08/14/13  8:20 AM      Result Value Ref  Range   Glucose-Capillary 100 (*) 70 - 99 mg/dL    Imaging: Dg Chest Portable 1 View  08/13/2013   CLINICAL DATA:  Chest pain  EXAM: PORTABLE CHEST - 1 VIEW  COMPARISON:  04/21/2013  FINDINGS: Mild bibasilar atelectasis/infiltrate. Negative for heart failure or effusion. Heart size upper normal. Negative for mass lesion.  IMPRESSION: Mild bibasilar atelectasis/ infiltrate. Resolution of congestive heart failure since the prior study.   Electronically Signed   By: Franchot Gallo M.D.   On: 08/13/2013 02:41    Assessment:  Principal Problem:   NSTEMI (non-ST elevated myocardial infarction) Active Problems:   ESRD on dialysis   Anemia   Systolic and diastolic CHF, acute on chronic   Sustained VT (ventricular tachycardia)   Plan:  Plan for LHC today. He is agreeable to proceed. Would recommend increase in b-blocker to 25 mg BID given sustained VT. Give extra 12.5 mg dose now.  Time Spent Directly with Patient:  15 minutes  Length of Stay:  LOS: 1 day   Pixie Casino, MD, [facility] Attending Cardiologist [facility]  Kaison Mcparland C 08/14/2013, 12:55 PM

## 2013-08-14 NOTE — Progress Notes (Signed)
Site area: right groin Site Prior to Removal:  Level 0 Pressure Applied For: 20 minutes Manual:  yes  Patient Status During Pull:  Stable Post Pull Site:  Level 0 Post Pull Instructions Given:  yes Post Pull Pulses Present: yes and remaoned palpable during sheath pull Dressing Applied:  tegaderm Bedrest begins @ 15:10:00 Comments: No complications;

## 2013-08-14 NOTE — Progress Notes (Signed)
[facility] Progress Note  Subjective:    Feels fine this AM No recurrence of chest pain since admission Received regular dialysis last evening without incident I note did have transient BP drop to 94, otherwise no issues Pre/post weights reported as 76.1-->71.4 with EDW 73 UF goal achieved 2.6 kg s weights don't exactly correlate with UF removed For heart cath around 2 PM today per pt  Objective Filed Vitals:   08/14/13 0400 08/14/13 0500 08/14/13 0600 08/14/13 0800  BP: 143/69 111/64 131/40 132/77  Pulse: 94 87 92 95  Temp:    99.1 F (37.3 C)  TempSrc:    Oral  Resp:      Height:      Weight:      SpO2:    99%   Physical Exam General:Well appearing AAM  NAD BP 132/77  Pulse 95  Temp(Src) 99.1 F (37.3 C) (Oral)  Resp 20  Ht 5\' 6"  (1.676 m)  Wt 71.4 kg (157 lb 6.5 oz)  BMI 25.42 kg/m2  SpO2 99% Heart:S1S2 No S3 Lungs:Clear Abdomen:+ BS soft and no focal tenderness Extremities:No edema whatsoever Dialysis Access: Left AVF + bruit and thrill with bandaids still in place  Additional Objective Labs:  Recent Labs Lab 08/13/13 0200 08/13/13 0948 08/14/13 0431  NA 143 141 143  K 3.8 4.2 3.9  CL 95* 97 102  CO2 29 27 28   GLUCOSE 107* 81 94  BUN 48* 54* 23  CREATININE 10.44* 11.56* 5.92*  CALCIUM 9.8 9.2 9.3   Recent Labs Lab 08/13/13 0200 08/13/13 0948 08/14/13 0250  WBC 13.5* 12.0* 10.1  HGB 12.1* 11.2* 12.1*  HCT 36.8* 35.0* 37.7*  MCV 96.3 96.7 95.9  PLT 249 231 258       Component Value Date/Time   SDES URINE, CLEAN CATCH 04/21/2013 1904   SPECREQUEST NONE 04/21/2013 1904   CULT  Value: NO GROWTH Performed at [facility] 04/21/2013 1904   REPTSTATUS 04/22/2013 FINAL 04/21/2013 1904    Cardiac Enzymes:  Recent Labs Lab 08/13/13 0312  TROPONINI 0.43*   CBG:  Recent Labs Lab 08/13/13 1227 08/13/13 1316 08/13/13 1755 08/13/13 2335 08/14/13 0820  GLUCAP 60* 96 87 94 100*   Studies/Results: Dg Chest  Portable 1 View  08/13/2013   CLINICAL DATA:  Chest pain  EXAM: PORTABLE CHEST - 1 VIEW  COMPARISON:  04/21/2013  FINDINGS: Mild bibasilar atelectasis/infiltrate. Negative for heart failure or effusion. Heart size upper normal. Negative for mass lesion.  IMPRESSION: Mild bibasilar atelectasis/ infiltrate. Resolution of congestive heart failure since the prior study.   Electronically Signed   By: Franchot Gallo M.D.   On: 08/13/2013 02:41   Medications: . sodium chloride 10 mL/hr at 08/14/13 0645  . heparin 1,400 Units/hr (08/14/13 0600)   . amLODipine  5 mg Oral Daily  . aspirin EC  81 mg Oral Daily  . atorvastatin  20 mg Oral q morning - 10a  . calcitRIOL  0.5 mcg Oral Q T,Th,Sa-HD  . [START ON 08/15/2013] ferric gluconate (FERRLECIT/NULECIT) IV  62.5 mg Intravenous Q Thu-HD  . insulin aspart  0-5 Units Subcutaneous QHS  . insulin aspart  0-9 Units Subcutaneous TID WC  . metoprolol tartrate  12.5 mg Oral BID  .  morphine injection  4 mg Intravenous Once  . multivitamin  1 tablet Oral QHS  . sevelamer carbonate  1,600 mg Oral TID WC  . sodium chloride  3 mL Intravenous Q12H  . tamsulosin  0.4 mg  Oral QHS   Dialysis Orders: Center: TTS [facility] 4 hr 180 400/800 2k 2Ca EDW 73 -- venofer 50 per week no Aranesp calcitriol 0.5  Recent labs: Hgb 12.1 7/30 16% sat ferritin 131 P 5.6 Corr Ca 9.8 iPTH 1000-- UF about 2.5 q HD has only got down to 74s the past 4 tmt - runs full time  Assessment/Plan:   NSTEMI - for heart cath today. Pain free at present. On IV heparin/ASA/BB. Peak trop 0.43  ESRD - Continue usual TTS HD.   Anemia - Hb stable. Not on Aranesp. Continue weekly iron.  Secondary hyperparathyroidism - Calcitriol/Renvela. 2Ca bath  HTN/volume - Appears was below EDW post TMT but weights possible not reliable. No cramping.BP OK (+ meds amlodipine/metoprolol/tamsulosin)  Nutrition - NPO for procedure. Carb mod renal when po again.  DM per primary service  HLD - statin   Jamal Maes, MD [place] Pager 08/14/2013, 9:18 AM

## 2013-08-14 NOTE — CV Procedure (Addendum)
    Cardiac Catheterization Procedure Note  Name: [name] [name] MRN: [id number] DOB: Dec 08, 1945  Procedure: Left Heart Cath, Selective Coronary Angiography, LV angiography  Indication: Non-ST elevation myocardial infarction   Medications:  Sedation:  1 mg IV Versed, 25 mcg IV Fentanyl  Contrast:  70 ml Omnipaque  Procedural details: The right groin was prepped, draped, and anesthetized with 1% lidocaine. Using modified Seldinger technique, a 5 French sheath was introduced into the right femoral artery. Standard Judkins catheters were used for coronary angiography and left ventriculography. Catheter exchanges were performed over a guidewire. There were no immediate procedural complications. The patient was transferred to the post catheterization recovery area for further monitoring.   Procedural Findings:  Hemodynamics: AO:  104/59   mmHg LV:  102/2    mmHg LVEDP: 5  mmHg  Coronary angiography: Coronary dominance: Right   Left Main:  Normal  Left Anterior Descending (LAD):  Normal in size and moderately calcified proximally. There is a complex 80% proximal disease at the bifurcation of diagonal branch. The rest of the vessel has minor irregularities.  1st diagonal (D1):  Small in size with 50% ostial stenosis.  2nd diagonal (D2):  Large in size with 60-70% proximal stenosis.  3rd diagonal (D3):  Medium in size with no significant disease.  Circumflex (LCx):  Normal in size and nondominant. The vessel is mildly calcified. There is 80% stenosis in the midsegment.   1st obtuse marginal:  Small in size with minor irregularities.  2nd obtuse marginal:  large in size with 80% proximal stenosis.   3rd obtuse marginal:   minor irregularities.     Right Coronary Artery:  very large in size and dominant. There is a 99% proximal stenosis. There is a 40% mid stenosis. The rest of the vessel has minor irregularities.   Posterior descending artery:  very large in size with no  significant disease.   Posterior AV segment:  normal in size with 60% diffuse disease supplying a small area   Left ventriculography: Left ventricular systolic function is  mildly reduced  , LVEF is estimated at  40  %, there is no  significant mitral regurgitation   Final Conclusions:  1. Severe three-vessel coronary artery disease.  2. Mildly reduced LV systolic function. 3. Normal left ventricular end-diastolic pressure.  Recommendations:   the culprit for non-ST elevation myocardial infarction is the right coronary artery. However, the patient has underlying three-vessel disease. He is diabetic, with LV systolic dysfunction and on dialysis. Thus, he will probably do better with CABG than multivessel PCI. He also appears on the monitor that he might be in atrial flutter and might require anticoagulation. I ordered an EKG. I requested cardiothoracic consult for CABG. Resume anticoagulation with heparin 8 hours after sheath pull until CABG.    Kathlyn Sacramento MD, [facility] 08/14/2013, 2:30 PM

## 2013-08-14 NOTE — Interval H&P Note (Signed)
History and Physical Interval Note:  08/14/2013 1:59 PM  [name] [name]  has presented today for surgery, with the diagnosis of c/p  The various methods of treatment have been discussed with the patient and family. After consideration of risks, benefits and other options for treatment, the patient has consented to  Procedure(s): LEFT HEART CATHETERIZATION WITH CORONARY ANGIOGRAM (N/A) as a surgical intervention .  The patient's history has been reviewed, patient examined, no change in status, stable for surgery.  I have reviewed the patient's chart and labs.  Questions were answered to the patient's satisfaction.     Kathlyn Sacramento

## 2013-08-14 NOTE — Progress Notes (Signed)
ANTICOAGULATION CONSULT NOTE - Follow Up Consult  Pharmacy Consult for Heparin  Indication: chest pain/ACS  No Known Allergies  Patient Measurements: Height: 5\' 6"  (167.6 cm) Weight: 157 lb 6.5 oz (71.4 kg) IBW/kg (Calculated) : 63.8  Vital Signs: Temp: 99.1 F (37.3 C) (08/05 0800) Temp src: Oral (08/05 0800) BP: 132/77 mmHg (08/05 0800) Pulse Rate: 95 (08/05 0800)  Labs:  Recent Labs  08/13/13 0200 08/13/13 0312 08/13/13 0948 08/13/13 1224 08/13/13 2200 08/14/13 0250 08/14/13 0431 08/14/13 0725  HGB 12.1*  --  11.2*  --   --  12.1*  --   --   HCT 36.8*  --  35.0*  --   --  37.7*  --   --   PLT 249  --  231  --   --  258  --   --   LABPROT  --   --   --   --   --   --  13.8  --   INR  --   --   --   --   --   --  1.06  --   HEPARINUNFRC  --   --   --  <0.10* <0.10*  --   --  <0.10*  CREATININE 10.44*  --  11.56*  --   --   --  5.92*  --   TROPONINI  --  0.43*  --   --   --   --   --   --     Estimated Creatinine Clearance: 11.1 ml/min (by C-G formula based on Cr of 5.92).   Medications:  Heparin 1400 units/hr  Assessment: 68 yo male with CP on heparin for NSTEMI. Heparin level remains below goal on 1400 units/hr. No recent interuptions heparin infusion have been noted. Hg= 12.1 and plt= 258.  Goal of Therapy:  Heparin level 0.3-0.7 units/ml Monitor platelets by anticoagulation protocol: Yes   Plan:  -Increase heparin drip to 1650 units/hr -Will follow post cath -Daily CBC/HL  Hildred Laser, Pharm D 08/14/2013 10:10 AM

## 2013-08-14 NOTE — Progress Notes (Signed)
ANTICOAGULATION CONSULT NOTE - Follow Up Consult  Pharmacy Consult for Heparin  Indication: chest pain/ACS  No Known Allergies  Patient Measurements: Height: 5\' 6"  (167.6 cm) Weight: 157 lb 6.5 oz (71.4 kg) IBW/kg (Calculated) : 63.8  Vital Signs: Temp: 98.2 F (36.8 C) (08/05 1554) Temp src: Oral (08/05 1554) BP: 123/62 mmHg (08/05 1600) Pulse Rate: 60 (08/05 1600)  Labs:  Recent Labs  08/13/13 0200 08/13/13 0312 08/13/13 0948 08/13/13 1224 08/13/13 2200 08/14/13 0250 08/14/13 0431 08/14/13 0725  HGB 12.1*  --  11.2*  --   --  12.1*  --   --   HCT 36.8*  --  35.0*  --   --  37.7*  --   --   PLT 249  --  231  --   --  258  --   --   LABPROT  --   --   --   --   --   --  13.8  --   INR  --   --   --   --   --   --  1.06  --   HEPARINUNFRC  --   --   --  <0.10* <0.10*  --   --  <0.10*  CREATININE 10.44*  --  11.56*  --   --   --  5.92*  --   TROPONINI  --  0.43*  --   --   --   --   --   --     Estimated Creatinine Clearance: 11.1 ml/min (by C-G formula based on Cr of 5.92).   Medications:  Heparin 1400 units/hr  Assessment: 69 year old male presenting with chest pain found to have NSTEMI.  S/p cath which found severe 3-vessel CAD with RCA 99% stenosed, no stents were placed.  Pt to continue on heparin gtt while plans for CABG are discussed.  CBC remains stable, pt is ESRD on HD.   Goal of Therapy:  Heparin level 0.3-0.7 units/ml Monitor platelets by anticoagulation protocol: Yes   Plan:  -Resume heparin drip at 1650 units/hr to begin at 2315 -Daily CBC/HL -Next HL at 0630 -Monitor closely for s/sx bleeding   Hughes Better, PharmD, BCPS Clinical Pharmacist Pager: [phone] 08/14/2013 5:02 PM

## 2013-08-15 ENCOUNTER — Encounter (HOSPITAL_COMMUNITY): Payer: Medicare Other

## 2013-08-15 DIAGNOSIS — I509 Heart failure, unspecified: Secondary | ICD-10-CM

## 2013-08-15 DIAGNOSIS — Z01810 Encounter for preprocedural cardiovascular examination: Secondary | ICD-10-CM

## 2013-08-15 DIAGNOSIS — I2510 Atherosclerotic heart disease of native coronary artery without angina pectoris: Secondary | ICD-10-CM

## 2013-08-15 DIAGNOSIS — I5043 Acute on chronic combined systolic (congestive) and diastolic (congestive) heart failure: Secondary | ICD-10-CM

## 2013-08-15 DIAGNOSIS — I4892 Unspecified atrial flutter: Secondary | ICD-10-CM

## 2013-08-15 LAB — RENAL FUNCTION PANEL
Albumin: 3.1 g/dL — ABNORMAL LOW (ref 3.5–5.2)
Anion gap: 18 — ABNORMAL HIGH (ref 5–15)
BUN: 35 mg/dL — AB (ref 6–23)
CALCIUM: 9.4 mg/dL (ref 8.4–10.5)
CHLORIDE: 100 meq/L (ref 96–112)
CO2: 25 meq/L (ref 19–32)
Creatinine, Ser: 8.11 mg/dL — ABNORMAL HIGH (ref 0.50–1.35)
GFR calc Af Amer: 7 mL/min — ABNORMAL LOW (ref >90)
GFR, EST NON AFRICAN AMERICAN: 6 mL/min — AB (ref >90)
GLUCOSE: 88 mg/dL (ref 70–99)
Phosphorus: 5.7 mg/dL — ABNORMAL HIGH (ref 2.3–4.6)
Potassium: 4.1 mEq/L (ref 3.7–5.3)
Sodium: 143 mEq/L (ref 137–147)

## 2013-08-15 LAB — HEPARIN LEVEL (UNFRACTIONATED)
HEPARIN UNFRACTIONATED: 0.35 [IU]/mL (ref 0.30–0.70)
HEPARIN UNFRACTIONATED: 0.14 [IU]/mL — AB (ref 0.30–0.70)
Heparin Unfractionated: 0.39 IU/mL (ref 0.30–0.70)

## 2013-08-15 LAB — CBC
HEMATOCRIT: 35.1 % — AB (ref 39.0–52.0)
HEMOGLOBIN: 11.3 g/dL — AB (ref 13.0–17.0)
MCH: 30.9 pg (ref 26.0–34.0)
MCHC: 32.2 g/dL (ref 30.0–36.0)
MCV: 95.9 fL (ref 78.0–100.0)
Platelets: 261 10*3/uL (ref 150–400)
RBC: 3.66 MIL/uL — AB (ref 4.22–5.81)
RDW: 14.9 % (ref 11.5–15.5)
WBC: 8.9 10*3/uL (ref 4.0–10.5)

## 2013-08-15 LAB — GLUCOSE, CAPILLARY
GLUCOSE-CAPILLARY: 86 mg/dL (ref 70–99)
GLUCOSE-CAPILLARY: 79 mg/dL (ref 70–99)
GLUCOSE-CAPILLARY: 153 mg/dL — AB (ref 70–99)
Glucose-Capillary: 93 mg/dL (ref 70–99)

## 2013-08-15 MED ORDER — ACETAMINOPHEN 325 MG PO TABS
ORAL_TABLET | ORAL | Status: AC
  Filled 2013-08-15: qty 2

## 2013-08-15 NOTE — Progress Notes (Signed)
TELEMETRY: Reviewed telemetry pt in NSR with occ. Blocked PAC. Patient was in atrial flutter yesterday afternoon/evening. One 5 beat run NSVT.  Filed Vitals:   08/15/13 0400 08/15/13 0742 08/15/13 0822 08/15/13 1057  BP: 141/58  128/68 148/71  Pulse: 94 86 87 79  Temp: 98.9 F (37.2 C)  98.6 F (37 C) 97.8 F (36.6 C)  TempSrc: Oral  Oral Oral  Resp: 18  18 20   Height:      Weight:      SpO2: 97%  98% 99%    Intake/Output Summary (Last 24 hours) at 08/15/13 1150 Last data filed at 08/15/13 0600  Gross per 24 hour  Intake  555.5 ml  Output    130 ml  Net  425.5 ml   Filed Weights   08/13/13 0151 08/13/13 1810 08/13/13 2225  Weight: 167 lb (75.751 kg) 167 lb 12.3 oz (76.1 kg) 157 lb 6.5 oz (71.4 kg)    Subjective Feels well. No chest pain or SOB. Denies palpitations.  Marland Kitchen amLODipine  5 mg Oral Daily  . aspirin EC  81 mg Oral Daily  . atorvastatin  20 mg Oral q morning - 10a  . calcitRIOL  0.5 mcg Oral Q T,Th,Sa-HD  . ferric gluconate (FERRLECIT/NULECIT) IV  62.5 mg Intravenous Q Thu-HD  . insulin aspart  0-5 Units Subcutaneous QHS  . insulin aspart  0-9 Units Subcutaneous TID WC  . metoprolol tartrate  25 mg Oral BID  .  morphine injection  4 mg Intravenous Once  . multivitamin  1 tablet Oral QHS  . sevelamer carbonate  1,600 mg Oral TID WC  . tamsulosin  0.4 mg Oral QHS   . heparin 1,650 Units/hr (08/14/13 2307)    LABS: Basic Metabolic Panel:  Recent Labs  08/14/13 0431 08/15/13 0330  NA 143 143  K 3.9 4.1  CL 102 100  CO2 28 25  GLUCOSE 94 88  BUN 23 35*  CREATININE 5.92* 8.11*  CALCIUM 9.3 9.4  PHOS  --  5.7*   Liver Function Tests:  Recent Labs  08/15/13 0330  ALBUMIN 3.1*   No results found for this basename: LIPASE, AMYLASE,  in the last 72 hours CBC:  Recent Labs  08/14/13 0250 08/15/13 0330  WBC 10.1 8.9  HGB 12.1* 11.3*  HCT 37.7* 35.1*  MCV 95.9 95.9  PLT 258 261   Cardiac Enzymes:  Recent Labs  08/13/13 0312    TROPONINI 0.43*   BNP: No results found for this basename: PROBNP,  in the last 72 hours D-Dimer: No results found for this basename: DDIMER,  in the last 72 hours Hemoglobin A1C: No results found for this basename: HGBA1C,  in the last 72 hours Fasting Lipid Panel: No results found for this basename: CHOL, HDL, LDLCALC, TRIG, CHOLHDL, LDLDIRECT,  in the last 72 hours Thyroid Function Tests: No results found for this basename: TSH, T4TOTAL, FREET3, T3FREE, THYROIDAB,  in the last 72 hours   Radiology/Studies:  PORTABLE CHEST - 1 VIEW  COMPARISON: 04/21/2013  FINDINGS:  Mild bibasilar atelectasis/infiltrate. Negative for heart failure or  effusion. Heart size upper normal. Negative for mass lesion.  IMPRESSION:  Mild bibasilar atelectasis/ infiltrate. Resolution of congestive  heart failure since the prior study.  Electronically Signed  By: Franchot Gallo M.D.  On: 08/13/2013 02:41  PHYSICAL EXAM General: Well developed, well nourished, in no acute distress. Head: Normocephalic, atraumatic, sclera non-icteric, oropharynx is clear Neck: Negative for carotid bruits. JVD not elevated. No  adenopathy Lungs: Clear bilaterally to auscultation without wheezes, rales, or rhonchi. Breathing is unlabored. Heart: RRR S1 S2 without murmurs, rubs, or gallops.  Abdomen: Soft, non-tender, non-distended with normoactive bowel sounds. No hepatomegaly. No rebound/guarding. No obvious abdominal masses. Extremities: No clubbing, cyanosis or edema.  Distal pedal pulses are 2+ and equal bilaterally. No groin hematoma. Functioning AV fistula left arm. Neuro: Alert and oriented X 3. Moves all extremities spontaneously. Psych:  Responds to questions appropriately with a normal affect.  ASSESSMENT AND PLAN: 1. NSTEMI. Cath reveals severe 3 vessel. CAD. Plan CABG when schedule allows. Pre op evaluation with Echo, carotid dopplers, PFTs in progress. Continue IV heparin. On metoprolol and Norvasc.   2.  ESRD- for dialysis today.  3. Acute systolic CHF. EF 40% by cath. Echo pending.  Filling pressures normal at cath.  4. History of moderate MR. Echo pending.  5. Atrial flutter. Resolved.  6. NSVT.   7. DM type 2.  8. HTN controlled.  Present on Admission:  . NSTEMI (non-ST elevated myocardial infarction) . Systolic and diastolic CHF, acute on chronic . Anemia  Signed, [name] [name], [place] 08/15/2013 11:50 AM

## 2013-08-15 NOTE — Progress Notes (Signed)
Pre-op Cardiac Surgery  Carotid Findings:  Pending  Upper Extremity Right Left  Brachial Pressures 137-Triphasic Dialysis access  Radial Waveforms Triphasic Triphasic  Ulnar Waveforms Triphasic Triphasic  Palmar Arch (Allen's Test) Signal obliterates with radial compression, is unaffected with ulnar compression. Within normal limits.     Lower  Extremity Right Left  Dorsalis Pedis 129-Biphasic 135-Monophasic  Anterior Tibial    Posterior Tibial 137-Biphasic 138-Biphasic  Ankle/Brachial Indices 1.0 1.01    Findings:   Bilateral ABIs are within normal limits.  08/15/2013 10:27 AM Maudry Mayhew, RVT, RDCS, RDMS

## 2013-08-15 NOTE — Progress Notes (Signed)
[facility] Progress Note  Subjective:    S/P cath yesterday showing 3 vessel CAD For CABG when the schedule allows Some discomfort in shoulders that he attributes to lying in the bed Due for dialysis today Last treatment  pre/post weights reported as 76.1-->71.4 with EDW 73 UF goal achieved 2.6 kg s weights didn't exactly correlate with UF removed LVEDP was normal at time of cath  Objective Filed Vitals:   08/14/13 2152 08/14/13 2343 08/15/13 0000 08/15/13 0400  BP: 132/70 132/76 141/71 141/58  Pulse: 93 83 83 94  Temp:  98.2 F (36.8 C)  98.9 F (37.2 C)  TempSrc:  Oral  Oral  Resp:  17  18  Height:      Weight:      SpO2:  96% 98% 97%   Physical Exam General:Well appearing AAM  NAD BP 141/58  Pulse 94  Temp(Src) 98.9 F (37.2 C) (Oral)  Resp 18  Ht 5\' 6"  (1.676 m)  Wt 71.4 kg (157 lb 6.5 oz)  BMI 25.42 kg/m2  SpO2 97% Heart:S1S2 No S3 Lungs:Clear Abdomen:+ BS soft and no focal tenderness Extremities:No edema whatsoever Dialysis Access: Left AVF + bruit and thrill   Additional Objective Labs:  Recent Labs Lab 08/13/13 0948 08/14/13 0431 08/15/13 0330  NA 141 143 143  K 4.2 3.9 4.1  CL 97 102 100  CO2 27 28 25   GLUCOSE 81 94 88  BUN 54* 23 35*  CREATININE 11.56* 5.92* 8.11*  CALCIUM 9.2 9.3 9.4  PHOS  --   --  5.7*    Recent Labs Lab 08/13/13 0200 08/13/13 0948 08/14/13 0250 08/15/13 0330  WBC 13.5* 12.0* 10.1 8.9  HGB 12.1* 11.2* 12.1* 11.3*  HCT 36.8* 35.0* 37.7* 35.1*  MCV 96.3 96.7 95.9 95.9  PLT 249 231 258 261       Component Value Date/Time   SDES URINE, CLEAN CATCH 04/21/2013 1904   SPECREQUEST NONE 04/21/2013 1904   CULT  Value: NO GROWTH Performed at [facility] 04/21/2013 1904   REPTSTATUS 04/22/2013 FINAL 04/21/2013 1904    Cardiac Enzymes:  Recent Labs Lab 08/13/13 0312  TROPONINI 0.43*   CBG:  Recent Labs Lab 08/13/13 2335 08/14/13 0820 08/14/13 1448 08/14/13 1731 08/14/13 2127   GLUCAP 94 100* 83 126* 121*    Medications: . heparin 1,650 Units/hr (08/14/13 2307)   . amLODipine  5 mg Oral Daily  . aspirin EC  81 mg Oral Daily  . atorvastatin  20 mg Oral q morning - 10a  . calcitRIOL  0.5 mcg Oral Q T,Th,Sa-HD  . ferric gluconate (FERRLECIT/NULECIT) IV  62.5 mg Intravenous Q Thu-HD  . insulin aspart  0-5 Units Subcutaneous QHS  . insulin aspart  0-9 Units Subcutaneous TID WC  . metoprolol tartrate  25 mg Oral BID  .  morphine injection  4 mg Intravenous Once  . multivitamin  1 tablet Oral QHS  . sevelamer carbonate  1,600 mg Oral TID WC  . tamsulosin  0.4 mg Oral QHS   Dialysis Orders: Center: TTS [facility] 4 hr 180 400/800 2k 2Ca EDW 73 -- venofer 50 per week no Aranesp calcitriol 0.5  Recent labs: Hgb 12.1 7/30 16% sat ferritin 131 P 5.6 Corr Ca 9.8 iPTH 1000-- UF about 2.5 q HD has only got down to 74s the past 4 tmt - runs full time  Assessment/Plan:   NSTEMI - severe 3 vessel disease. Dr. Earlene Plater has seen and recommends CABG when can get  on the schedule. To get PFT's and other pre-cCABG studies done  ESRD - Continue usual TTS HD. Dialysis today. Will use 4K bath given prior episode of NSVT  Anemia - Hb stable. Not on Aranesp. Continue weekly iron.  Secondary hyperparathyroidism - Calcitriol/Renvela. 2Ca bath  HTN/volume - Appears was below EDW post last TMT but weights possibly not reliable .BP OK (+ meds amlodipine/metoprolol/tamsulosin)  Nutrition - Carb mod renal.  DM per primary service  HLD - statin   Jamal Maes, MD [place] Pager 08/15/2013, 8:21 AM

## 2013-08-15 NOTE — Progress Notes (Signed)
ANTICOAGULATION CONSULT NOTE - Follow Up Consult  Pharmacy Consult for Heparin  Indication: chest pain/ACS, awaiting CABG  No Known Allergies  Patient Measurements: Height: 5\' 6"  (167.6 cm) Weight: 157 lb 10.1 oz (71.5 kg) IBW/kg (Calculated) : 63.8  Vital Signs: Temp: 97.7 F (36.5 C) (08/06 1937) Temp src: Oral (08/06 1937) BP: 106/68 mmHg (08/06 1937) Pulse Rate: 94 (08/06 2141)  Labs:  Recent Labs  08/13/13 0312 08/13/13 0948  08/14/13 0250 08/14/13 0431  08/15/13 0330 08/15/13 0630 08/15/13 1200 08/15/13 2200  HGB  --  11.2*  --  12.1*  --   --  11.3*  --   --   --   HCT  --  35.0*  --  37.7*  --   --  35.1*  --   --   --   PLT  --  231  --  258  --   --  261  --   --   --   LABPROT  --   --   --   --  13.8  --   --   --   --   --   INR  --   --   --   --  1.06  --   --   --   --   --   HEPARINUNFRC  --   --   < >  --   --   < >  --  0.35 0.14* 0.39  CREATININE  --  11.56*  --   --  5.92*  --  8.11*  --   --   --   TROPONINI 0.43*  --   --   --   --   --   --   --   --   --   < > = values in this interval not displayed.  Estimated Creatinine Clearance: 8.1 ml/min (by C-G formula based on Cr of 8.11).   Medications:  Heparin 1850 units/hr  Assessment: Heparin re-started s/p cath while planning for CABG. HL is now 0.39, other labs as above.   Goal of Therapy:  Heparin level 0.3-0.7 units/ml Monitor platelets by anticoagulation protocol: Yes   Plan:  -Continue heparin drip at 1850 units/hr -HL with AM labs -Daily CBC/HL -Monitor for bleeding  Narda Bonds 08/15/2013,11:11 PM

## 2013-08-15 NOTE — Progress Notes (Addendum)
ANTICOAGULATION CONSULT NOTE - Follow Up Consult  Pharmacy Consult for Heparin  Indication: chest pain/ACS  No Known Allergies  Patient Measurements: Height: 5\' 6"  (167.6 cm) Weight: 157 lb 6.5 oz (71.4 kg) IBW/kg (Calculated) : 63.8  Vital Signs: Temp: 98.6 F (37 C) (08/06 0822) Temp src: Oral (08/06 0822) BP: 128/68 mmHg (08/06 0822) Pulse Rate: 87 (08/06 0822)  Labs:  Recent Labs  08/13/13 0312 08/13/13 0948  08/13/13 2200 08/14/13 0250 08/14/13 0431 08/14/13 0725 08/15/13 0330 08/15/13 0630  HGB  --  11.2*  --   --  12.1*  --   --  11.3*  --   HCT  --  35.0*  --   --  37.7*  --   --  35.1*  --   PLT  --  231  --   --  258  --   --  261  --   LABPROT  --   --   --   --   --  13.8  --   --   --   INR  --   --   --   --   --  1.06  --   --   --   HEPARINUNFRC  --   --   < > <0.10*  --   --  <0.10*  --  0.35  CREATININE  --  11.56*  --   --   --  5.92*  --  8.11*  --   TROPONINI 0.43*  --   --   --   --   --   --   --   --   < > = values in this interval not displayed.  Estimated Creatinine Clearance: 8.1 ml/min (by C-G formula based on Cr of 8.11).   Medications:  Heparin 1400 units/hr  Assessment: 68 year old male presenting with chest pain found to have NSTEMI.  S/p cath which found severe 3-vessel CAD with RCA 99% stenosed, no stents were placed.  Pt to continue on heparin gtt while plans for CABG are arranged, level at goal this am. Hgb down this am, pltc stable, no bleeding issues noted.   Goal of Therapy:  Heparin level 0.3-0.7 units/ml Monitor platelets by anticoagulation protocol: Yes   Plan:  -Continue heparin drip at 1650 units/hr -Confirmatory level at noon -Daily CBC/HL -Monitor closely for s/sx bleeding  [name] Hearing PharmD., BCPS Clinical Pharmacist Pager [phone] 08/15/2013 9:13 AM  Addendum   Follow up heparin level is low, no bleeding or IV issues noted from nursing. Patient just left for HD, will inform HD nurse of rate change then  recheck level tonight.  -Increase heparin to 1850 units/hr -HL 2100 08/15/2013 12:58 PM

## 2013-08-16 DIAGNOSIS — Z01810 Encounter for preprocedural cardiovascular examination: Secondary | ICD-10-CM

## 2013-08-16 DIAGNOSIS — R011 Cardiac murmur, unspecified: Secondary | ICD-10-CM

## 2013-08-16 DIAGNOSIS — I059 Rheumatic mitral valve disease, unspecified: Secondary | ICD-10-CM

## 2013-08-16 DIAGNOSIS — I2510 Atherosclerotic heart disease of native coronary artery without angina pectoris: Secondary | ICD-10-CM

## 2013-08-16 LAB — RENAL FUNCTION PANEL
ANION GAP: 15 (ref 5–15)
Albumin: 3.5 g/dL (ref 3.5–5.2)
BUN: 21 mg/dL (ref 6–23)
CO2: 30 mEq/L (ref 19–32)
CREATININE: 6.01 mg/dL — AB (ref 0.50–1.35)
Calcium: 9.6 mg/dL (ref 8.4–10.5)
Chloride: 97 mEq/L (ref 96–112)
GFR calc Af Amer: 10 mL/min — ABNORMAL LOW (ref >90)
GFR calc non Af Amer: 9 mL/min — ABNORMAL LOW (ref >90)
GLUCOSE: 95 mg/dL (ref 70–99)
PHOSPHORUS: 4.3 mg/dL (ref 2.3–4.6)
Potassium: 4.1 mEq/L (ref 3.7–5.3)
Sodium: 142 mEq/L (ref 137–147)

## 2013-08-16 LAB — CBC
HEMATOCRIT: 36.8 % — AB (ref 39.0–52.0)
Hemoglobin: 11.7 g/dL — ABNORMAL LOW (ref 13.0–17.0)
MCH: 30.8 pg (ref 26.0–34.0)
MCHC: 31.8 g/dL (ref 30.0–36.0)
MCV: 96.8 fL (ref 78.0–100.0)
Platelets: 295 10*3/uL (ref 150–400)
RBC: 3.8 MIL/uL — ABNORMAL LOW (ref 4.22–5.81)
RDW: 14.9 % (ref 11.5–15.5)
WBC: 8.4 10*3/uL (ref 4.0–10.5)

## 2013-08-16 LAB — HEPARIN LEVEL (UNFRACTIONATED)
Heparin Unfractionated: 0.19 IU/mL — ABNORMAL LOW (ref 0.30–0.70)
Heparin Unfractionated: 0.63 IU/mL (ref 0.30–0.70)

## 2013-08-16 LAB — GLUCOSE, CAPILLARY
GLUCOSE-CAPILLARY: 88 mg/dL (ref 70–99)
Glucose-Capillary: 96 mg/dL (ref 70–99)
Glucose-Capillary: 135 mg/dL — ABNORMAL HIGH (ref 70–99)
Glucose-Capillary: 89 mg/dL (ref 70–99)

## 2013-08-16 NOTE — Progress Notes (Signed)
ANTICOAGULATION CONSULT NOTE - Follow Up Consult  Pharmacy Consult for Heparin  Indication: chest pain/ACS, awaiting CABG  No Known Allergies  Patient Measurements: Height: 5\' 6"  (167.6 cm) Weight: 157 lb 10.1 oz (71.5 kg) IBW/kg (Calculated) : 63.8  Vital Signs: Temp: 98.8 F (37.1 C) (08/06 2316) Temp src: Oral (08/06 2316) BP: 118/103 mmHg (08/07 0000) Pulse Rate: 90 (08/07 0000)  Labs:  Recent Labs  08/13/13 0948  08/14/13 0250 08/14/13 0431  08/15/13 0330  08/15/13 1200 08/15/13 2200 08/16/13 0220  HGB 11.2*  --  12.1*  --   --  11.3*  --   --   --  11.7*  HCT 35.0*  --  37.7*  --   --  35.1*  --   --   --  36.8*  PLT 231  --  258  --   --  261  --   --   --  295  LABPROT  --   --   --  13.8  --   --   --   --   --   --   INR  --   --   --  1.06  --   --   --   --   --   --   HEPARINUNFRC  --   < >  --   --   < >  --   < > 0.14* 0.39 0.19*  CREATININE 11.56*  --   --  5.92*  --  8.11*  --   --   --   --   < > = values in this interval not displayed.  Estimated Creatinine Clearance: 8.1 ml/min (by C-G formula based on Cr of 8.11).   Medications:  Heparin 1850 units/hr  Assessment: Heparin re-started s/p cath while planning for CABG. HL is now 0.19, other labs as above.   Goal of Therapy:  Heparin level 0.3-0.7 units/ml Monitor platelets by anticoagulation protocol: Yes   Plan:  -Increase heparin drip to 2050 units/hr -1200 HL -Daily CBC/HL -Monitor for bleeding  Narda Bonds 08/16/2013,3:58 AM

## 2013-08-16 NOTE — Progress Notes (Signed)
CARDIAC REHAB PHASE I   PRE:  Rate/Rhythm: 99 SR    BP: sitting 128/72    SaO2: 96 RA  MODE:  Ambulation: 700 ft   POST:  Rate/Rhythm: 115 ST after standing, 104 after walk, occ PVC    BP: sitting 114/62     SaO2: 98 RA  tolerated well, thankful to walk/be out of bed. Discussed sternal precautions, mobility, IS use and d/c plans. Lives alone and he is thinking of either staying with sister or going to SNF.  Gave OHS book, guideline and video to watch. [place], [place], ACSM 08/16/2013 11:29 AM

## 2013-08-16 NOTE — Progress Notes (Signed)
  Echocardiogram 2D Echocardiogram has been performed.  Mauricio Po 08/16/2013, 3:51 PM

## 2013-08-16 NOTE — Progress Notes (Signed)
ANTICOAGULATION CONSULT NOTE - Follow Up Consult  Pharmacy Consult for Heparin  Indication: chest pain/ACS, awaiting CABG  No Known Allergies  Patient Measurements: Height: 5\' 6"  (167.6 cm) Weight: 157 lb 10.1 oz (71.5 kg) IBW/kg (Calculated) : 63.8  Vital Signs: Temp: 98.1 F (36.7 C) (08/07 1933) Temp src: Oral (08/07 1933) BP: 140/88 mmHg (08/07 2109) Pulse Rate: 87 (08/07 2109)  Labs:  Recent Labs  08/14/13 0250 08/14/13 0431  08/15/13 0330  08/16/13 0220 08/16/13 1200 08/16/13 2245  HGB 12.1*  --   --  11.3*  --  11.7*  --   --   HCT 37.7*  --   --  35.1*  --  36.8*  --   --   PLT 258  --   --  261  --  295  --   --   LABPROT  --  13.8  --   --   --   --   --   --   INR  --  1.06  --   --   --   --   --   --   HEPARINUNFRC  --   --   < >  --   < > 0.19* <0.10* 0.63  CREATININE  --  5.92*  --  8.11*  --  6.01*  --   --   < > = values in this interval not displayed.  Estimated Creatinine Clearance: 10.9 ml/min (by C-G formula based on Cr of 6.01).   Medications:  Heparin 2050 units/hr  Assessment: Heparin re-started s/p cath while planning for CABG (likely Monday). HL is now 0.63, other labs as above.   Goal of Therapy:  Heparin level 0.3-0.7 units/ml Monitor platelets by anticoagulation protocol: Yes   Plan:  -Continue heparin drip at 2050 units/hr -HL with AM labs -Daily CBC/HL -Monitor for bleeding  Narda Bonds 08/16/2013,11:15 PM

## 2013-08-16 NOTE — Progress Notes (Signed)
2 Days Post-Op Procedure(s) (LRB): LEFT HEART CATHETERIZATION WITH CORONARY ANGIOGRAM (N/A) Subjective:  No chest pain or shortness of breath. He tolerated HD yesterday without problems. Only complaint is that his butt hurts from laying on it too much.  Objective: Vital signs in last 24 hours: Temp:  [97.6 F (36.4 C)-98.8 F (37.1 C)] 98 F (36.7 C) (08/07 1144) Pulse Rate:  [89-103] 89 (08/07 0400) Cardiac Rhythm:  [-] Normal sinus rhythm (08/07 0836) Resp:  [17-19] 19 (08/07 1144) BP: (106-118)/(56-103) 110/56 mmHg (08/07 1144) SpO2:  [96 %-100 %] 99 % (08/07 1144)  Hemodynamic parameters for last 24 hours:    Intake/Output from previous day: 08/06 0701 - 08/07 0700 In: 427.6 [I.V.:427.6] Out: 2825 [Urine:125] Intake/Output this shift:    General appearance: alert and cooperative Heart: regular rate and rhythm, S1, S2 normal, no murmur, click, rub or gallop Lungs: clear to auscultation bilaterally  Lab Results:  Recent Labs  08/15/13 0330 08/16/13 0220  WBC 8.9 8.4  HGB 11.3* 11.7*  HCT 35.1* 36.8*  PLT 261 295   BMET:  Recent Labs  08/15/13 0330 08/16/13 0220  NA 143 142  K 4.1 4.1  CL 100 97  CO2 25 30  GLUCOSE 88 95  BUN 35* 21  CREATININE 8.11* 6.01*  CALCIUM 9.4 9.6    PT/INR:  Recent Labs  08/14/13 0431  LABPROT 13.8  INR 1.06   ABG    Component Value Date/Time   PHART 7.396 04/21/2013 1802   HCO3 16.6* 04/21/2013 1802   TCO2 17 04/21/2013 1802   ACIDBASEDEF 7.0* 04/21/2013 1802   O2SAT 92.0 04/21/2013 1802   CBG (last 3)   Recent Labs  08/15/13 2140 08/16/13 0840 08/16/13 1253  GLUCAP 153* 96 88    Assessment/Plan:  Severe multivessel CAD with moderate LV dysfunction. He has a tight stenosis of a large RCA and complex 80% proximal LAD disease as well as LCX disease so I think his LV will improve with revascularization. Echo shows no significant valvular disease. Carotid dopplers ok. PFT's pending. He will need CABG next week.  He will have HD Saturday. I will be out of town next week but Dr. Servando Snare is on call this weekend and will decide when he can get done. I discussed that with the patient.  LOS: 3 days    Kaiyan Luczak K 08/16/2013

## 2013-08-16 NOTE — Progress Notes (Signed)
      [address]       [place] [address]             [phone]    Patient seen and films reviewed. Has been evaluated by Dr Cyndia Bent. I have discussed with the patent planned CABG, likely Monday. Will need dialysis sat and Sunday in preparations for CABG.   Emory Leaver B Arrow Emmerich MD      [address] [place] [address] Office [phone]   [place]eeper [phone]

## 2013-08-16 NOTE — Progress Notes (Addendum)
ANTICOAGULATION CONSULT NOTE - Follow Up Consult  Pharmacy Consult for Heparin  Indication: chest pain/ACS, awaiting CABG  No Known Allergies  Patient Measurements: Height: 5\' 6"  (167.6 cm) Weight: 157 lb 10.1 oz (71.5 kg) IBW/kg (Calculated) : 63.8  Vital Signs: Temp: 98 F (36.7 C) (08/07 1144) Temp src: Oral (08/07 1144) BP: 110/56 mmHg (08/07 1144) Pulse Rate: 89 (08/07 0400)  Labs:  Recent Labs  08/14/13 0250 08/14/13 0431  08/15/13 0330  08/15/13 2200 08/16/13 0220 08/16/13 1200  HGB 12.1*  --   --  11.3*  --   --  11.7*  --   HCT 37.7*  --   --  35.1*  --   --  36.8*  --   PLT 258  --   --  261  --   --  295  --   LABPROT  --  13.8  --   --   --   --   --   --   INR  --  1.06  --   --   --   --   --   --   HEPARINUNFRC  --   --   < >  --   < > 0.39 0.19* <0.10*  CREATININE  --  5.92*  --  8.11*  --   --  6.01*  --   < > = values in this interval not displayed.  Estimated Creatinine Clearance: 10.9 ml/min (by C-G formula based on Cr of 6.01).   Medications:  Heparin 2050 units/hr  Assessment: Heparin re-started s/p cath while planning for CABG. HL is low with lab draw this afternoon, discussed with nurse and lab was collected at least an hour after heparin was turned off so lab is unlikely to reflect true steady state level. Heparin turned back on ~1500 will recheck tonight and continue at same rate for now. No bleeding issues noted.  Goal of Therapy:  Heparin level 0.3-0.7 units/ml Monitor platelets by anticoagulation protocol: Yes   Plan:  -Continue heparin drip at 2050 units/hr -Repeat HL at 2200 -Daily CBC/HL -Monitor for bleeding  [name] Hearing PharmD., BCPS Clinical Pharmacist Pager [phone] 08/16/2013 3:50 PM

## 2013-08-16 NOTE — Progress Notes (Addendum)
Pre-op Cardiac Surgery  Carotid Findings:  Bilateral:  1-39% ICA stenosis.  Right:  Vertebral artery flow is antegrade.  Left:  Vertebral artery is not insonated.    Upper Extremity Right Left  Brachial Pressures 137-Triphasic Dialysis access  Radial Waveforms Triphasic Triphasic  Ulnar Waveforms Triphasic Triphasic  Palmar Arch (Allen's Test) Signal obliterates with radial compression, is unaffected with ulnar compression. Within normal limits.     Lower  Extremity Right Left  Dorsalis Pedis 129-Biphasic 135-Monophasic  Anterior Tibial    Posterior Tibial 137-Biphasic 138-Biphasic  Ankle/Brachial Indices 1.0 1.01    Findings:   Bilateral ABIs are within normal limits.  08/16/2013 11:45 AM Maudry Mayhew, RVT, RDCS, RDMS

## 2013-08-16 NOTE — Progress Notes (Signed)
[facility] Progress Note  Subjective:    Pt still waiting for date re CABG Had dialysis yesterday without incident No chest pain or SOB Currently sinus tach Last treatment  pre/post weights reported as 74.2-->71.5 with outpt EDW 73 so consistently about 71.5 post HD here in the hospital (and LVEDP was normal at time of cath)  Objective Filed Vitals:   08/15/13 2316 08/16/13 0000 08/16/13 0400 08/16/13 0836  BP:  118/103 118/71 116/67  Pulse:  90 89   Temp: 98.8 F (37.1 C)   98.6 F (37 C)  TempSrc: Oral   Oral  Resp:  17 18 17   Height:      Weight:      SpO2:  100% 99% 96%   Physical Exam General:Well appearing AAM  NAD BP 116/67  Pulse 89  Temp(Src) 98.6 F (37 C) (Oral)  Resp 17  Ht 5\' 6"  (1.676 m)  Wt 71.5 kg (157 lb 10.1 oz)  BMI 25.45 kg/m2  SpO2 96% Heart:S1S2 No S3 Bruit from left AVF heard across left chest Lungs:Clear Abdomen:+ BS soft and no focal tenderness Extremities:No edema whatsoever Dialysis Access: Left AVF + bruit and thrill   Recent Labs Lab 08/14/13 0431 08/15/13 0330 08/16/13 0220  NA 143 143 142  K 3.9 4.1 4.1  CL 102 100 97  CO2 28 25 30   GLUCOSE 94 88 95  BUN 23 35* 21  CREATININE 5.92* 8.11* 6.01*  CALCIUM 9.3 9.4 9.6  PHOS  --  5.7* 4.3    Recent Labs Lab 08/13/13 0200 08/13/13 0948 08/14/13 0250 08/15/13 0330 08/16/13 0220  WBC 13.5* 12.0* 10.1 8.9 8.4  HGB 12.1* 11.2* 12.1* 11.3* 11.7*  HCT 36.8* 35.0* 37.7* 35.1* 36.8*  MCV 96.3 96.7 95.9 95.9 96.8  PLT 249 231 258 261 295       Component Value Date/Time   SDES URINE, CLEAN CATCH 04/21/2013 1904   SPECREQUEST NONE 04/21/2013 1904   CULT  Value: NO GROWTH Performed at [facility] 04/21/2013 1904   REPTSTATUS 04/22/2013 FINAL 04/21/2013 1904    Recent Labs Lab 08/13/13 0312  TROPONINI 0.43*   CBG:  Recent Labs Lab 08/14/13 2127 08/15/13 0825 08/15/13 1102 08/15/13 1737 08/15/13 2140  GLUCAP 121* 86 93 79 153*     Medications: . heparin 2,050 Units/hr (08/16/13 0738)   . amLODipine  5 mg Oral Daily  . aspirin EC  81 mg Oral Daily  . atorvastatin  20 mg Oral q morning - 10a  . calcitRIOL  0.5 mcg Oral Q T,Th,Sa-HD  . ferric gluconate (FERRLECIT/NULECIT) IV  62.5 mg Intravenous Q Thu-HD  . insulin aspart  0-5 Units Subcutaneous QHS  . insulin aspart  0-9 Units Subcutaneous TID WC  . metoprolol tartrate  25 mg Oral BID  .  morphine injection  4 mg Intravenous Once  . multivitamin  1 tablet Oral QHS  . sevelamer carbonate  1,600 mg Oral TID WC  . tamsulosin  0.4 mg Oral QHS   Dialysis Orders: Center: TTS [facility] 4 hr 180 400/800 2k 2Ca EDW 73 -- venofer 50 per week no Aranesp calcitriol 0.5  Recent labs: Hgb 12.1 7/30 16% sat ferritin 131 P 5.6 Corr Ca 9.8 iPTH 1000--   Assessment/Plan:   NSTEMI - severe 3 vessel disease. Dr. Earlene Plater has seen and recommends CABG when can get on the schedule. Getting pre-CABG studies done  ESRD - Continue usual TTS HD. Dialysis Sat. If K less than or equal  to 4.2 will use 4K bath given prior rhythm issues (NSVT and flutter)  Anemia - Hb stable. Not on Aranesp. Continue weekly iron.  Secondary hyperparathyroidism - Calcitriol/Renvela. 2Ca bath  HTN/volume - Appears was below EDW post last 2 TMTs .BP OK (+ meds amlodipine/metoprolol/tamsulosin)  Nutrition - Carb mod renal.  DM per primary service  HLD - statin   Jamal Maes, MD [facility] [phone] Pager 08/16/2013, 10:01 AM

## 2013-08-16 NOTE — Progress Notes (Signed)
TELEMETRY: Reviewed telemetry pt in NSR with occ. PVCs. One 4 beat run NSVT.  Filed Vitals:   08/15/13 2316 08/16/13 0000 08/16/13 0400 08/16/13 0836  BP:  118/103 118/71 116/67  Pulse:  90 89   Temp: 98.8 F (37.1 C)   98.6 F (37 C)  TempSrc: Oral   Oral  Resp:  17 18 17   Height:      Weight:      SpO2:  100% 99% 96%    Intake/Output Summary (Last 24 hours) at 08/16/13 1124 Last data filed at 08/16/13 0600  Gross per 24 hour  Intake  427.6 ml  Output   2825 ml  Net -2397.4 ml   Filed Weights   08/13/13 2225 08/15/13 1248 08/15/13 1650  Weight: 157 lb 6.5 oz (71.4 kg) 163 lb 9.3 oz (74.2 kg) 157 lb 10.1 oz (71.5 kg)    Subjective Feels well. No chest pain or SOB. Denies palpitations.  Marland Kitchen amLODipine  5 mg Oral Daily  . aspirin EC  81 mg Oral Daily  . atorvastatin  20 mg Oral q morning - 10a  . calcitRIOL  0.5 mcg Oral Q T,Th,Sa-HD  . ferric gluconate (FERRLECIT/NULECIT) IV  62.5 mg Intravenous Q Thu-HD  . insulin aspart  0-5 Units Subcutaneous QHS  . insulin aspart  0-9 Units Subcutaneous TID WC  . metoprolol tartrate  25 mg Oral BID  .  morphine injection  4 mg Intravenous Once  . multivitamin  1 tablet Oral QHS  . sevelamer carbonate  1,600 mg Oral TID WC  . tamsulosin  0.4 mg Oral QHS   . heparin 2,050 Units/hr (08/16/13 0738)    LABS: Basic Metabolic Panel:  Recent Labs  08/15/13 0330 08/16/13 0220  NA 143 142  K 4.1 4.1  CL 100 97  CO2 25 30  GLUCOSE 88 95  BUN 35* 21  CREATININE 8.11* 6.01*  CALCIUM 9.4 9.6  PHOS 5.7* 4.3   Liver Function Tests:  Recent Labs  08/15/13 0330 08/16/13 0220  ALBUMIN 3.1* 3.5   No results found for this basename: LIPASE, AMYLASE,  in the last 72 hours CBC:  Recent Labs  08/15/13 0330 08/16/13 0220  WBC 8.9 8.4  HGB 11.3* 11.7*  HCT 35.1* 36.8*  MCV 95.9 96.8  PLT 261 295   Cardiac Enzymes: No results found for this basename: CKTOTAL, CKMB, CKMBINDEX, TROPONINI,  in the last 72 hours BNP: No  results found for this basename: PROBNP,  in the last 72 hours D-Dimer: No results found for this basename: DDIMER,  in the last 72 hours Hemoglobin A1C: No results found for this basename: HGBA1C,  in the last 72 hours Fasting Lipid Panel: No results found for this basename: CHOL, HDL, LDLCALC, TRIG, CHOLHDL, LDLDIRECT,  in the last 72 hours Thyroid Function Tests: No results found for this basename: TSH, T4TOTAL, FREET3, T3FREE, THYROIDAB,  in the last 72 hours   Radiology/Studies:  PORTABLE CHEST - 1 VIEW  COMPARISON: 04/21/2013  FINDINGS:  Mild bibasilar atelectasis/infiltrate. Negative for heart failure or  effusion. Heart size upper normal. Negative for mass lesion.  IMPRESSION:  Mild bibasilar atelectasis/ infiltrate. Resolution of congestive  heart failure since the prior study.  Electronically Signed  By: Franchot Gallo M.D.  On: 08/13/2013 02:41  PHYSICAL EXAM General: Well developed, well nourished, in no acute distress. Head: Normocephalic, atraumatic, sclera non-icteric, oropharynx is clear Neck: Negative for carotid bruits. JVD not elevated. No adenopathy Lungs: Clear bilaterally to auscultation  without wheezes, rales, or rhonchi. Breathing is unlabored. Heart: RRR S1 S2 without murmurs, rubs, or gallops.  Abdomen: Soft, non-tender, non-distended with normoactive bowel sounds. No hepatomegaly. No rebound/guarding. No obvious abdominal masses. Extremities: No clubbing, cyanosis or edema.  Distal pedal pulses are 2+ and equal bilaterally. No groin hematoma. Functioning AV fistula left arm. Neuro: Alert and oriented X 3. Moves all extremities spontaneously. Psych:  Responds to questions appropriately with a normal affect.  ASSESSMENT AND PLAN: 1. NSTEMI. Cath reveals severe 3 vessel. CAD. Plan CABG when schedule allows. Pre op evaluation with Echo, carotid dopplers, PFTs in progress. Continue IV heparin. On metoprolol and Norvasc.   2. ESRD- for dialysis today.  3.  Acute systolic CHF. EF 40% by cath. Echo pending.  Filling pressures normal at cath.  4. History of moderate MR. Echo pending.  5. Atrial flutter. Resolved.  6. NSVT.   7. DM type 2.  8. HTN controlled.  Present on Admission:  . NSTEMI (non-ST elevated myocardial infarction) . Systolic and diastolic CHF, acute on chronic . Anemia  Signed, [name] [name], [place] 08/16/2013 11:24 AM

## 2013-08-17 DIAGNOSIS — D500 Iron deficiency anemia secondary to blood loss (chronic): Secondary | ICD-10-CM

## 2013-08-17 LAB — RENAL FUNCTION PANEL
ANION GAP: 19 — AB (ref 5–15)
Albumin: 3.5 g/dL (ref 3.5–5.2)
BUN: 38 mg/dL — ABNORMAL HIGH (ref 6–23)
CALCIUM: 10.1 mg/dL (ref 8.4–10.5)
CO2: 22 mEq/L (ref 19–32)
Chloride: 98 mEq/L (ref 96–112)
Creatinine, Ser: 8.35 mg/dL — ABNORMAL HIGH (ref 0.50–1.35)
GFR, EST AFRICAN AMERICAN: 7 mL/min — AB (ref >90)
GFR, EST NON AFRICAN AMERICAN: 6 mL/min — AB (ref >90)
Glucose, Bld: 105 mg/dL — ABNORMAL HIGH (ref 70–99)
PHOSPHORUS: 5.8 mg/dL — AB (ref 2.3–4.6)
POTASSIUM: 4.8 meq/L (ref 3.7–5.3)
SODIUM: 139 meq/L (ref 137–147)

## 2013-08-17 LAB — HEPARIN LEVEL (UNFRACTIONATED): Heparin Unfractionated: 0.64 IU/mL (ref 0.30–0.70)

## 2013-08-17 LAB — GLUCOSE, CAPILLARY
GLUCOSE-CAPILLARY: 92 mg/dL (ref 70–99)
Glucose-Capillary: 80 mg/dL (ref 70–99)
Glucose-Capillary: 152 mg/dL — ABNORMAL HIGH (ref 70–99)

## 2013-08-17 LAB — CBC
HCT: 35.5 % — ABNORMAL LOW (ref 39.0–52.0)
HEMOGLOBIN: 11.8 g/dL — AB (ref 13.0–17.0)
MCH: 31.9 pg (ref 26.0–34.0)
MCHC: 33.2 g/dL (ref 30.0–36.0)
MCV: 95.9 fL (ref 78.0–100.0)
Platelets: 276 10*3/uL (ref 150–400)
RBC: 3.7 MIL/uL — ABNORMAL LOW (ref 4.22–5.81)
RDW: 14.7 % (ref 11.5–15.5)
WBC: 9.4 10*3/uL (ref 4.0–10.5)

## 2013-08-17 MED ORDER — CARVEDILOL 6.25 MG PO TABS
6.2500 mg | ORAL_TABLET | Freq: Two times a day (BID) | ORAL | Status: DC
  Administered 2013-08-17 – 2013-08-18 (×2): 6.25 mg via ORAL
  Filled 2013-08-17 (×6): qty 1

## 2013-08-17 NOTE — Procedures (Signed)
I have personally attended this patient's dialysis session.   Tolerating well, 2K bath, tight heparin. BP stable nadir 109/67 so far. AVF 400 OLC green For HD again tomorrow prior to CABG on Monday.  Jamal Maes, MD [facility] [phone] Pager 08/17/2013, 11:54 AM

## 2013-08-17 NOTE — Progress Notes (Signed)
ANTICOAGULATION CONSULT NOTE - Follow Up Consult  Pharmacy Consult for Heparin  Indication: chest pain/ACS, awaiting CABG  No Known Allergies  Patient Measurements: Height: 5\' 6"  (167.6 cm) Weight: 160 lb 7.9 oz (72.8 kg) IBW/kg (Calculated) : 63.8  Vital Signs: Temp: 99.1 F (37.3 C) (08/08 0735) Temp src: Oral (08/08 0735) BP: 115/68 mmHg (08/08 0800) Pulse Rate: 84 (08/08 0800)  Labs:  Recent Labs  08/15/13 0330  08/16/13 0220 08/16/13 1200 08/16/13 2245 08/17/13 0325  HGB 11.3*  --  11.7*  --   --  11.8*  HCT 35.1*  --  36.8*  --   --  35.5*  PLT 261  --  295  --   --  276  HEPARINUNFRC  --   < > 0.19* <0.10* 0.63 0.64  CREATININE 8.11*  --  6.01*  --   --  8.35*  < > = values in this interval not displayed.  Estimated Creatinine Clearance: 7.9 ml/min (by C-G formula based on Cr of 8.35).   Medications:  Heparin 2050 units/hr  Assessment: Heparin re-started s/p cath for 3V CAD while planning for CABG (likely Monday). HL remains therapeutic at 0.64. CBC low but stable with no bleeding reported.   Goal of Therapy:  Heparin level 0.3-0.7 units/ml Monitor platelets by anticoagulation protocol: Yes   Plan:  - Continue heparin drip at 2050 units/hr - Daily CBC/HL - Monitor for bleeding  Harolyn Rutherford, PharmD Clinical Pharmacist - Resident Pager: [phone] Pharmacy: [phone] 08/17/2013 8:15 AM

## 2013-08-17 NOTE — Progress Notes (Signed)
[facility] Progress Note  Subjective:    CABG scheduled for Monday Dr. Servando Snare wants HD Sat AND Sunday No arrythmias No chest pain Just wants to go home Last treatment  pre/post weights reported as 74.2-->71.5 with outpt EDW 73 so consistently about 71.5 post HD here in the hospital (and LVEDP was normal at time of cath)  Objective Filed Vitals:   08/16/13 1933 08/16/13 2109 08/16/13 2316 08/17/13 0317  BP: 115/67 140/88 136/72 120/61  Pulse: 85 87 80   Temp: 98.1 F (36.7 C)  98.5 F (36.9 C) 98.6 F (37 C)  TempSrc: Oral  Oral Oral  Resp:    20  Height:      Weight:    74.6 kg (164 lb 7.4 oz)  SpO2: 98%  98% 97%   Physical Exam General:Well appearing AAM  NAD BP 120/61  Pulse 80  Temp(Src) 98.6 F (37 C) (Oral)  Resp 20  Ht 5\' 6"  (1.676 m)  Wt 74.6 kg (164 lb 7.4 oz)  BMI 26.56 kg/m2  SpO2 97% Heart:S1S2 No S3 Bruit from left AVF heard across left chest Lungs:Clear Abdomen:+ BS soft and no focal tenderness Extremities:No edema whatsoever Dialysis Access: Left AVF + bruit and thrill   Recent Labs Lab 08/15/13 0330 08/16/13 0220 08/17/13 0325  NA 143 142 139  K 4.1 4.1 4.8  CL 100 97 98  CO2 25 30 22   GLUCOSE 88 95 105*  BUN 35* 21 38*  CREATININE 8.11* 6.01* 8.35*  CALCIUM 9.4 9.6 10.1  PHOS 5.7* 4.3 5.8*    Recent Labs Lab 08/13/13 0948 08/14/13 0250 08/15/13 0330 08/16/13 0220 08/17/13 0325  WBC 12.0* 10.1 8.9 8.4 9.4  HGB 11.2* 12.1* 11.3* 11.7* 11.8*  HCT 35.0* 37.7* 35.1* 36.8* 35.5*  MCV 96.7 95.9 95.9 96.8 95.9  PLT 231 258 261 295 276       Component Value Date/Time   SDES URINE, CLEAN CATCH 04/21/2013 1904   SPECREQUEST NONE 04/21/2013 1904   CULT  Value: NO GROWTH Performed at [facility] 04/21/2013 1904   REPTSTATUS 04/22/2013 FINAL 04/21/2013 1904    Recent Labs Lab 08/13/13 0312  TROPONINI 0.43*   CBG:  Recent Labs Lab 08/15/13 2140 08/16/13 0840 08/16/13 1253 08/16/13 1707  08/16/13 2145  GLUCAP 153* 96 88 135* 89    Medications: . heparin 2,050 Units/hr (08/16/13 2111)   . amLODipine  5 mg Oral Daily  . aspirin EC  81 mg Oral Daily  . atorvastatin  20 mg Oral q morning - 10a  . calcitRIOL  0.5 mcg Oral Q T,Th,Sa-HD  . ferric gluconate (FERRLECIT/NULECIT) IV  62.5 mg Intravenous Q Thu-HD  . insulin aspart  0-5 Units Subcutaneous QHS  . insulin aspart  0-9 Units Subcutaneous TID WC  . metoprolol tartrate  25 mg Oral BID  .  morphine injection  4 mg Intravenous Once  . multivitamin  1 tablet Oral QHS  . sevelamer carbonate  1,600 mg Oral TID WC  . tamsulosin  0.4 mg Oral QHS   Dialysis Orders: Center: TTS [facility] 4 hr 180 400/800 2k 2Ca EDW 73 -- venofer 50 per week no Aranesp calcitriol 0.5  Recent labs: Hgb 12.1 7/30 16% sat ferritin 131 P 5.6 Corr Ca 9.8 iPTH 1000--   Assessment/Plan:   NSTEMI - severe 3 vessel disease. Dr. Earlene Plater has seen and recommends CABG which is tentative for Monday. Dr. Servando Snare wants HD Sat AND Sun in preparation  ESRD - Continue  usual TTS HD. When K less than or equal to 4.2 will use 4K bath given prior rhythm issues (NSVT and flutter), but with K 4.8 today- 2K bath, 4K tomorrow ([facility] wants HD on Sunday as well d/t CABG Monday).  Anemia - Hb stable. Not on Aranesp. Continue weekly iron.  Secondary hyperparathyroidism - Calcitriol/Renvela. 2Ca bath  HTN/volume - Appears was below EDW post last 2 TMTs and weights consistent. .BP OK (+ meds amlodipine/metoprolol/tamsulosin)  Nutrition - Carb mod renal.  DM per primary service  HLD - statin   Jamal Maes, MD [place] Pager 08/17/2013, 7:00 AM

## 2013-08-17 NOTE — Progress Notes (Signed)
CARDIAC REHAB PHASE I   PRE:  Rate/Rhythm: 109ST  BP:  Sitting: 106/68     SaO2: 98% RA  MODE:  Ambulation: 600 ft   POST:  Rate/Rhythm: 103 st  BP:  Sitting: 102/56     SaO2: 97%  1:30pm-2:12pm  Patient ambulated well holding onto an IV pole.  Patient states the only complaint he has is some lower back pain which he states is from lying in the bed for an extended period of time.  Patient was left in bed with friend at bedside.  [facility], [place] 08/17/2013 2:10 PM

## 2013-08-17 NOTE — Progress Notes (Signed)
Patient ID: [name] [name], male   DOB: 1945-12-16, 68 y.o.   MRN: [id number]   No complaints this morning, no chest pain. Had HD this morning.   TELEMETRY: Reviewed telemetry pt in sinus tachy post-HD  Filed Vitals:   08/17/13 1100 08/17/13 1130 08/17/13 1155 08/17/13 1300  BP: 100/61 109/67 124/69 114/62  Pulse: 92 90 88 95  Temp:   97.5 F (36.4 C)   TempSrc:   Oral   Resp:   18 18  Height:      Weight:   157 lb 10.1 oz (71.5 kg)   SpO2:   98% 98%    Intake/Output Summary (Last 24 hours) at 08/17/13 1337 Last data filed at 08/17/13 1300  Gross per 24 hour  Intake  606.5 ml  Output   1476 ml  Net -869.5 ml   Filed Weights   08/17/13 0317 08/17/13 0735 08/17/13 1155  Weight: 164 lb 7.4 oz (74.6 kg) 160 lb 7.9 oz (72.8 kg) 157 lb 10.1 oz (71.5 kg)    . amLODipine  5 mg Oral Daily  . aspirin EC  81 mg Oral Daily  . atorvastatin  20 mg Oral q morning - 10a  . calcitRIOL  0.5 mcg Oral Q T,Th,Sa-HD  . ferric gluconate (FERRLECIT/NULECIT) IV  62.5 mg Intravenous Q Thu-HD  . insulin aspart  0-5 Units Subcutaneous QHS  . insulin aspart  0-9 Units Subcutaneous TID WC  . metoprolol tartrate  25 mg Oral BID  .  morphine injection  4 mg Intravenous Once  . multivitamin  1 tablet Oral QHS  . sevelamer carbonate  1,600 mg Oral TID WC  . tamsulosin  0.4 mg Oral QHS   . heparin 2,050 Units/hr (08/16/13 2111)    LABS: Basic Metabolic Panel:  Recent Labs  08/16/13 0220 08/17/13 0325  NA 142 139  K 4.1 4.8  CL 97 98  CO2 30 22  GLUCOSE 95 105*  BUN 21 38*  CREATININE 6.01* 8.35*  CALCIUM 9.6 10.1  PHOS 4.3 5.8*   Liver Function Tests:  Recent Labs  08/16/13 0220 08/17/13 0325  ALBUMIN 3.5 3.5   No results found for this basename: LIPASE, AMYLASE,  in the last 72 hours CBC:  Recent Labs  08/16/13 0220 08/17/13 0325  WBC 8.4 9.4  HGB 11.7* 11.8*  HCT 36.8* 35.5*  MCV 96.8 95.9  PLT 295 276   Cardiac Enzymes: No results found for this basename: CKTOTAL,  CKMB, CKMBINDEX, TROPONINI,  in the last 72 hours BNP: No results found for this basename: PROBNP,  in the last 72 hours D-Dimer: No results found for this basename: DDIMER,  in the last 72 hours Hemoglobin A1C: No results found for this basename: HGBA1C,  in the last 72 hours Fasting Lipid Panel: No results found for this basename: CHOL, HDL, LDLCALC, TRIG, CHOLHDL, LDLDIRECT,  in the last 72 hours Thyroid Function Tests: No results found for this basename: TSH, T4TOTAL, FREET3, T3FREE, THYROIDAB,  in the last 72 hours  PHYSICAL EXAM General: Well developed, well nourished, in no acute distress. Head: Normocephalic, atraumatic, sclera non-icteric, oropharynx is clear Neck: Negative for carotid bruits. JVD not elevated. No adenopathy Lungs: Clear bilaterally to auscultation without wheezes, rales, or rhonchi. Breathing is unlabored. Heart: mildly tachy, RRR S1 S2 without murmurs, rubs, or gallops.  Abdomen: Soft, non-tender, non-distended with normoactive bowel sounds. No hepatomegaly. No rebound/guarding. No obvious abdominal masses. Extremities: No clubbing, cyanosis or edema.  Functioning AV fistula left arm.  Neuro: Alert and oriented X 3. Moves all extremities spontaneously. Psych:  Responds to questions appropriately with a normal affect.  ASSESSMENT AND PLAN: 1. NSTEMI. Cath reveals severe 3 vessel CAD. Plan CABG on Monday. Continue IV heparin, ASA, statin. On metoprolol and Norvasc.  2. ESRD: HD today, will go again tomorrow. 3. Acute systolic CHF: EF 50-27% by echo. Looks euvolemic now with dialysis. Will stop metoprolol and put him on Coreg.  4. Atrial flutter. Resolved. 5. NSVT.  6. DM type 2. 7. HTN controlled.  Signed, Loralie Champagne, MD 08/17/2013 1:37 PM

## 2013-08-17 NOTE — Progress Notes (Signed)
Picked up for hemodialysis as endorsed.

## 2013-08-18 LAB — BLOOD GAS, ARTERIAL
ACID-BASE EXCESS: 7.4 mmol/L — AB (ref 0.0–2.0)
Bicarbonate: 30.8 mEq/L — ABNORMAL HIGH (ref 20.0–24.0)
Drawn by: 277331
FIO2: 0.21 %
O2 Saturation: 92.2 %
PO2 ART: 58.4 mmHg — AB (ref 80.0–100.0)
Patient temperature: 97.7
TCO2: 32 mmol/L (ref 0–100)
pCO2 arterial: 37.6 mmHg (ref 35.0–45.0)
pH, Arterial: 7.521 — ABNORMAL HIGH (ref 7.350–7.450)

## 2013-08-18 LAB — GLUCOSE, CAPILLARY
GLUCOSE-CAPILLARY: 116 mg/dL — AB (ref 70–99)
Glucose-Capillary: 89 mg/dL (ref 70–99)
Glucose-Capillary: 80 mg/dL (ref 70–99)
Glucose-Capillary: 106 mg/dL — ABNORMAL HIGH (ref 70–99)

## 2013-08-18 LAB — RENAL FUNCTION PANEL
Albumin: 3.6 g/dL (ref 3.5–5.2)
Anion gap: 16 — ABNORMAL HIGH (ref 5–15)
BUN: 25 mg/dL — AB (ref 6–23)
CALCIUM: 10.2 mg/dL (ref 8.4–10.5)
CO2: 28 mEq/L (ref 19–32)
CREATININE: 6.46 mg/dL — AB (ref 0.50–1.35)
Chloride: 93 mEq/L — ABNORMAL LOW (ref 96–112)
GFR calc non Af Amer: 8 mL/min — ABNORMAL LOW (ref >90)
GFR, EST AFRICAN AMERICAN: 9 mL/min — AB (ref >90)
GLUCOSE: 121 mg/dL — AB (ref 70–99)
Phosphorus: 5.4 mg/dL — ABNORMAL HIGH (ref 2.3–4.6)
Potassium: 4.3 mEq/L (ref 3.7–5.3)
SODIUM: 137 meq/L (ref 137–147)

## 2013-08-18 LAB — URINALYSIS, ROUTINE W REFLEX MICROSCOPIC
Bilirubin Urine: NEGATIVE
Glucose, UA: NEGATIVE mg/dL
Ketones, ur: NEGATIVE mg/dL
Nitrite: NEGATIVE
Specific Gravity, Urine: 1.01 (ref 1.005–1.030)
UROBILINOGEN UA: 0.2 mg/dL (ref 0.0–1.0)
pH: 8.5 — ABNORMAL HIGH (ref 5.0–8.0)

## 2013-08-18 LAB — URINE MICROSCOPIC-ADD ON

## 2013-08-18 LAB — ABO/RH: ABO/RH(D): B POS

## 2013-08-18 LAB — CBC
HEMATOCRIT: 36.6 % — AB (ref 39.0–52.0)
Hemoglobin: 11.8 g/dL — ABNORMAL LOW (ref 13.0–17.0)
MCH: 30.9 pg (ref 26.0–34.0)
MCHC: 32.2 g/dL (ref 30.0–36.0)
MCV: 95.8 fL (ref 78.0–100.0)
Platelets: 280 10*3/uL (ref 150–400)
RBC: 3.82 MIL/uL — ABNORMAL LOW (ref 4.22–5.81)
RDW: 14.8 % (ref 11.5–15.5)
WBC: 8.5 10*3/uL (ref 4.0–10.5)

## 2013-08-18 LAB — HEPARIN LEVEL (UNFRACTIONATED): Heparin Unfractionated: 0.79 IU/mL — ABNORMAL HIGH (ref 0.30–0.70)

## 2013-08-18 LAB — SURGICAL PCR SCREEN
MRSA, PCR: NEGATIVE
Staphylococcus aureus: NEGATIVE

## 2013-08-18 MED ORDER — DEXTROSE 5 % IV SOLN
750.0000 mg | INTRAVENOUS | Status: DC
  Filled 2013-08-18: qty 750

## 2013-08-18 MED ORDER — DEXTROSE 5 % IV SOLN
1.5000 g | INTRAVENOUS | Status: AC
  Administered 2013-08-19: 1.5 g via INTRAVENOUS
  Administered 2013-08-19: .75 g via INTRAVENOUS
  Filled 2013-08-18: qty 1.5

## 2013-08-18 MED ORDER — CHLORHEXIDINE GLUCONATE CLOTH 2 % EX PADS
6.0000 | MEDICATED_PAD | Freq: Once | CUTANEOUS | Status: AC
  Administered 2013-08-19: 6 via TOPICAL

## 2013-08-18 MED ORDER — SODIUM CHLORIDE 0.9 % IV SOLN
INTRAVENOUS | Status: DC
  Filled 2013-08-18: qty 1

## 2013-08-18 MED ORDER — MAGNESIUM SULFATE 50 % IJ SOLN
40.0000 meq | INTRAMUSCULAR | Status: DC
Start: 2013-08-19 — End: 2013-08-19
  Filled 2013-08-18: qty 10

## 2013-08-18 MED ORDER — PLASMA-LYTE 148 IV SOLN
INTRAVENOUS | Status: AC
  Administered 2013-08-19: 09:00:00
  Filled 2013-08-18: qty 2.5

## 2013-08-18 MED ORDER — SODIUM CHLORIDE 0.9 % IV SOLN
INTRAVENOUS | Status: DC
  Filled 2013-08-18: qty 40

## 2013-08-18 MED ORDER — VANCOMYCIN HCL 10 G IV SOLR
1250.0000 mg | INTRAVENOUS | Status: AC
  Administered 2013-08-19: 1250 mg via INTRAVENOUS
  Filled 2013-08-18: qty 1250

## 2013-08-18 MED ORDER — PHENYLEPHRINE HCL 10 MG/ML IJ SOLN
30.0000 ug/min | INTRAVENOUS | Status: DC
  Filled 2013-08-18: qty 2

## 2013-08-18 MED ORDER — EPINEPHRINE HCL 1 MG/ML IJ SOLN
0.5000 ug/min | INTRAVENOUS | Status: DC
  Filled 2013-08-18: qty 4

## 2013-08-18 MED ORDER — DEXMEDETOMIDINE HCL IN NACL 400 MCG/100ML IV SOLN: 0.1000 ug/kg/h | INTRAVENOUS | Status: DC

## 2013-08-18 MED ORDER — DOPAMINE-DEXTROSE 3.2-5 MG/ML-% IV SOLN: 2.0000 ug/kg/min | INTRAVENOUS | Status: DC

## 2013-08-18 MED ORDER — POTASSIUM CHLORIDE 2 MEQ/ML IV SOLN
80.0000 meq | INTRAVENOUS | Status: DC
  Filled 2013-08-18: qty 40

## 2013-08-18 MED ORDER — NITROGLYCERIN IN D5W 200-5 MCG/ML-% IV SOLN: 2.0000 ug/min | INTRAVENOUS | Status: DC

## 2013-08-18 MED ORDER — TEMAZEPAM 15 MG PO CAPS
15.0000 mg | ORAL_CAPSULE | Freq: Once | ORAL | Status: AC
  Administered 2013-08-18: 15 mg via ORAL
  Filled 2013-08-18: qty 1

## 2013-08-18 MED ORDER — BISACODYL 5 MG PO TBEC
5.0000 mg | DELAYED_RELEASE_TABLET | Freq: Once | ORAL | Status: AC
  Administered 2013-08-18: 5 mg via ORAL
  Filled 2013-08-18: qty 1

## 2013-08-18 MED ORDER — METOPROLOL TARTRATE 12.5 MG HALF TABLET
12.5000 mg | ORAL_TABLET | Freq: Once | ORAL | Status: AC
  Administered 2013-08-19: 12.5 mg via ORAL
  Filled 2013-08-18: qty 1

## 2013-08-18 MED ORDER — CHLORHEXIDINE GLUCONATE CLOTH 2 % EX PADS
6.0000 | MEDICATED_PAD | Freq: Once | CUTANEOUS | Status: AC
  Administered 2013-08-18: 6 via TOPICAL

## 2013-08-18 MED ORDER — SODIUM CHLORIDE 0.9 % IV SOLN
INTRAVENOUS | Status: DC
  Filled 2013-08-18: qty 30

## 2013-08-18 NOTE — Progress Notes (Signed)
Patient ID: [name] [name], male   DOB: [birth year], 68 y.o.   MRN: [id number]      [address]       [place] [address]             [phone]                 4 Days Post-Op Procedure(s) (LRB): LEFT HEART CATHETERIZATION WITH CORONARY ANGIOGRAM (N/A)  LOS: 5 days   Subjective: No complaint of chest pain now, on dialysis  Objective: Vital signs in last 24 hours: Patient Vitals for the past 24 hrs:  BP Temp Temp src Pulse Resp SpO2 Weight  08/18/13 1045 121/60 mmHg - - 84 16 - -  08/18/13 1030 131/65 mmHg - - 84 12 - -  08/18/13 1015 115/57 mmHg - - 94 26 - -  08/18/13 1000 108/59 mmHg - - 86 16 - -  08/18/13 0951 126/58 mmHg - - 83 17 - -  08/18/13 0950 126/58 mmHg - - 83 18 97 % -  08/18/13 0945 130/67 mmHg - - 84 17 - -  08/18/13 0944 124/65 mmHg 98.4 F (36.9 C) Oral 88 13 97 % 164 lb 0.4 oz (74.4 kg)  08/18/13 0837 94/58 mmHg - - 91 - - -  08/18/13 0800 94/58 mmHg 98 F (36.7 C) Oral - 19 96 % -  08/18/13 0321 - 98.2 F (36.8 C) Oral - - - -  08/18/13 0320 139/92 mmHg - - - - 98 % -  08/18/13 0045 124/65 mmHg - - - - - -  08/18/13 0042 - 98.2 F (36.8 C) Oral - - - -  08/17/13 2045 103/69 mmHg - - - - - -  08/17/13 2042 - 98 F (36.7 C) Oral - - - -  08/17/13 1700 116/57 mmHg 97.6 F (36.4 C) Oral 81 18 98 % -  08/17/13 1646 - 97.5 F (36.4 C) Oral - - - -  08/17/13 1300 114/62 mmHg - - 95 18 98 % -  08/17/13 1155 124/69 mmHg 97.5 F (36.4 C) Oral 88 18 98 % 157 lb 10.1 oz (71.5 kg)  08/17/13 1130 109/67 mmHg - - 90 - - -    Filed Weights   08/17/13 0735 08/17/13 1155 08/18/13 0944  Weight: 160 lb 7.9 oz (72.8 kg) 157 lb 10.1 oz (71.5 kg) 164 lb 0.4 oz (74.4 kg)    Hemodynamic parameters for last 24 hours:    Intake/Output from previous day: 08/08 0701 - 08/09 0700 In: 1160.3 [P.O.:710; I.V.:450.3] Out: 1302 [Urine:1; Stool:1] Intake/Output this shift: Total I/O In: 55.5 [I.V.:55.5] Out: -   Scheduled Meds: . amLODipine  5 mg Oral  Daily  . aspirin EC  81 mg Oral Daily  . atorvastatin  20 mg Oral q morning - 10a  . calcitRIOL  0.5 mcg Oral Q T,Th,Sa-HD  . carvedilol  6.25 mg Oral BID WC  . ferric gluconate (FERRLECIT/NULECIT) IV  62.5 mg Intravenous Q Thu-HD  . insulin aspart  0-5 Units Subcutaneous QHS  . insulin aspart  0-9 Units Subcutaneous TID WC  .  morphine injection  4 mg Intravenous Once  . multivitamin  1 tablet Oral QHS  . sevelamer carbonate  1,600 mg Oral TID WC  . tamsulosin  0.4 mg Oral QHS   Continuous Infusions: . heparin 1,850 Units (08/18/13 0800)   PRN Meds:.sodium chloride, sodium chloride, acetaminophen, feeding supplement (NEPRO CARB STEADY), heparin, heparin, lidocaine (PF), lidocaine-prilocaine, nitroGLYCERIN, ondansetron (  ZOFRAN) IV, pentafluoroprop-tetrafluoroeth  General appearance: alert, cooperative, appears older than stated age and no distress Neurologic: intact Heart: regular rate and rhythm, S1, S2 normal, no murmur, click, rub or gallop Lungs: clear to auscultation bilaterally Abdomen: soft, non-tender; bowel sounds normal; no masses,  no organomegaly Extremities: extremities normal, atraumatic, no cyanosis or edema and Homans sign is negative, no sign of DVT Wound: cath site ok  Lab Results: CBC: Recent Labs  08/17/13 0325 08/18/13 0245  WBC 9.4 8.5  HGB 11.8* 11.8*  HCT 35.5* 36.6*  PLT 276 280   BMET:  Recent Labs  08/17/13 0325 08/18/13 0248  NA 139 137  K 4.8 4.3  CL 98 93*  CO2 22 28  GLUCOSE 105* 121*  BUN 38* 25*  CREATININE 8.35* 6.46*  CALCIUM 10.1 10.2    PT/INR: No results found for this basename: LABPROT, INR,  in the last 72 hours   Radiology No results found.   Assessment/Plan: S/P Procedure(s) (LRB): LEFT HEART CATHETERIZATION WITH CORONARY ANGIOGRAM (N/A) Plan cabg in am  The goals risks and alternatives of the planned surgical procedure CABG  have been discussed with the patient in detail. The risks of the procedure including  death, infection, stroke, myocardial infarction, bleeding, blood transfusion have all been discussed specifically.  I have quoted [name] [name] a 5 % of perioperative mortality and a complication rate as high as 20  %. The patient's questions have been answered.[name] [name] is willing  to proceed with the planned procedure.  [name] Isaac MD 08/18/2013 11:01 AM

## 2013-08-18 NOTE — Progress Notes (Signed)
Patient ID: [name] [name], male   DOB: 20-Dec-1945, 68 y.o.   MRN: [id number]   No complaints this morning, no chest pain. Plan for CABG tomorrow.  TELEMETRY: Reviewed telemetry pt in sinus tachy post-HD  Filed Vitals:   08/18/13 1215 08/18/13 1230 08/18/13 1245 08/18/13 1300  BP: 116/71 113/64 120/57 122/62  Pulse: 85 94 92 84  Temp: 97.7 F (36.5 C)     TempSrc: Oral     Resp: 20 25 16 10   Height: 5\' 5"  (1.651 m)     Weight:      SpO2: 100%  98% 92%    Intake/Output Summary (Last 24 hours) at 08/18/13 1307 Last data filed at 08/18/13 1000  Gross per 24 hour  Intake 892.77 ml  Output      2 ml  Net 890.77 ml   Filed Weights   08/17/13 0735 08/17/13 1155 08/18/13 0944  Weight: 160 lb 7.9 oz (72.8 kg) 157 lb 10.1 oz (71.5 kg) 164 lb 0.4 oz (74.4 kg)    . [START ON 08/19/2013] aminocaproic acid (AMICAR) for OHS   Intravenous To OR  . amLODipine  5 mg Oral Daily  . aspirin EC  81 mg Oral Daily  . atorvastatin  20 mg Oral q morning - 10a  . bisacodyl  5 mg Oral Once  . calcitRIOL  0.5 mcg Oral Q T,Th,Sa-HD  . carvedilol  6.25 mg Oral BID WC  . [START ON 08/19/2013] cefUROXime (ZINACEF)  IV  750 mg Intravenous To OR  . Chlorhexidine Gluconate Cloth  6 each Topical Once   And  . Chlorhexidine Gluconate Cloth  6 each Topical Once  . [START ON 08/19/2013] dexmedetomidine  0.1-0.7 mcg/kg/hr Intravenous To OR  . [START ON 08/19/2013] DOPamine  2-20 mcg/kg/min Intravenous To OR  . [START ON 08/19/2013] epinephrine  0.5-20 mcg/min Intravenous To OR  . ferric gluconate (FERRLECIT/NULECIT) IV  62.5 mg Intravenous Q Thu-HD  . [START ON 08/19/2013] heparin-papaverine-plasmalyte irrigation   Irrigation To OR  . [START ON 08/19/2013] heparin 30,000 units/NS 1000 mL solution for CELLSAVER   Other To OR  . insulin aspart  0-5 Units Subcutaneous QHS  . insulin aspart  0-9 Units Subcutaneous TID WC  . [START ON 08/19/2013] insulin (NOVOLIN-R) infusion   Intravenous To OR  . [START ON 08/19/2013]  magnesium sulfate  40 mEq Other To OR  . [START ON 08/19/2013] metoprolol tartrate  12.5 mg Oral Once  .  morphine injection  4 mg Intravenous Once  . multivitamin  1 tablet Oral QHS  . [START ON 08/19/2013] nitroGLYCERIN  2-200 mcg/min Intravenous To OR  . [START ON 08/19/2013] phenylephrine (NEO-SYNEPHRINE) Adult infusion  30-200 mcg/min Intravenous To OR  . [START ON 08/19/2013] potassium chloride  80 mEq Other To OR  . sevelamer carbonate  1,600 mg Oral TID WC  . tamsulosin  0.4 mg Oral QHS  . temazepam  15 mg Oral Once   . heparin 1,850 Units (08/18/13 0800)    LABS: Basic Metabolic Panel:  Recent Labs  08/17/13 0325 08/18/13 0248  NA 139 137  K 4.8 4.3  CL 98 93*  CO2 22 28  GLUCOSE 105* 121*  BUN 38* 25*  CREATININE 8.35* 6.46*  CALCIUM 10.1 10.2  PHOS 5.8* 5.4*   Liver Function Tests:  Recent Labs  08/17/13 0325 08/18/13 0248  ALBUMIN 3.5 3.6   No results found for this basename: LIPASE, AMYLASE,  in the last 72 hours CBC:  Recent Labs  08/17/13 0325 08/18/13 0245  WBC 9.4 8.5  HGB 11.8* 11.8*  HCT 35.5* 36.6*  MCV 95.9 95.8  PLT 276 280   Cardiac Enzymes: No results found for this basename: CKTOTAL, CKMB, CKMBINDEX, TROPONINI,  in the last 72 hours BNP: No results found for this basename: PROBNP,  in the last 72 hours D-Dimer: No results found for this basename: DDIMER,  in the last 72 hours Hemoglobin A1C: No results found for this basename: HGBA1C,  in the last 72 hours Fasting Lipid Panel: No results found for this basename: CHOL, HDL, LDLCALC, TRIG, CHOLHDL, LDLDIRECT,  in the last 72 hours Thyroid Function Tests: No results found for this basename: TSH, T4TOTAL, FREET3, T3FREE, THYROIDAB,  in the last 72 hours  PHYSICAL EXAM General: Well developed, well nourished, in no acute distress. Head: Normocephalic, atraumatic, sclera non-icteric, oropharynx is clear Neck: Negative for carotid bruits. JVD not elevated. No adenopathy Lungs: Clear  bilaterally to auscultation without wheezes, rales, or rhonchi. Breathing is unlabored. Heart: mildly tachy, RRR S1 S2 without murmurs, rubs, or gallops.  Abdomen: Soft, non-tender, non-distended with normoactive bowel sounds. No hepatomegaly. No rebound/guarding. No obvious abdominal masses. Extremities: No clubbing, cyanosis or edema.  Functioning AV fistula left arm. Neuro: Alert and oriented X 3. Moves all extremities spontaneously. Psych:  Responds to questions appropriately with a normal affect.  ASSESSMENT AND PLAN: 1. NSTEMI. Cath reveals severe 3 vessel CAD. Plan CABG tomorrow. Continue IV heparin, ASA, statin. On metoprolol and Norvasc.  2. ESRD: HD today. 3. Acute systolic CHF: EF 70-01% by echo. Looks euvolemic now with dialysis. Will stop metoprolol and put him on Coreg.  4. Atrial flutter. Resolved. 5. NSVT.  6. DM type 2. 7. HTN controlled.  Pixie Casino, MD, [facility] Attending Cardiologist [place], MD 08/18/2013 1:07 PM

## 2013-08-18 NOTE — Progress Notes (Signed)
[facility] Progress Note  Subjective:    CABG scheduled for Monday Had dialysis yesterday and getting started now with treatment in anticipation of tomorrow's surgery  No arrythmias No chest pain  Last treatment  pre/post weights reported as 74.2-->71.5 with outpt EDW 73 so consistently about 71.5 post HD here in the hospital (and LVEDP was normal at time of cath) So 71.5-72 is current EDW  Objective Filed Vitals:   08/18/13 0320 08/18/13 0321 08/18/13 0800 08/18/13 0837  BP: 139/92  94/58 94/58  Pulse:    91  Temp:  98.2 F (36.8 C) 98 F (36.7 C)   TempSrc:  Oral Oral   Resp:   19   Height:      Weight:      SpO2: 98%  96%    Physical Exam General:Well appearing AAM  NAD BP 94/58  Pulse 91  Temp(Src) 98 F (36.7 C) (Oral)  Resp 19  Ht 5\' 6"  (1.676 m)  Wt 71.5 kg (157 lb 10.1 oz)  BMI 25.45 kg/m2  SpO2 96% Heart:S1S2 No S3 Bruit from left AVF heard across left chest Lungs:Clear Abdomen:+ BS soft and no focal tenderness Extremities:No edema whatsoever Dialysis Access: Left AVF + bruit and thrill   Recent Labs Lab 08/16/13 0220 08/17/13 0325 08/18/13 0248  NA 142 139 137  K 4.1 4.8 4.3  CL 97 98 93*  CO2 30 22 28   GLUCOSE 95 105* 121*  BUN 21 38* 25*  CREATININE 6.01* 8.35* 6.46*  CALCIUM 9.6 10.1 10.2  PHOS 4.3 5.8* 5.4*    Recent Labs Lab 08/14/13 0250 08/15/13 0330 08/16/13 0220 08/17/13 0325 08/18/13 0245  WBC 10.1 8.9 8.4 9.4 8.5  HGB 12.1* 11.3* 11.7* 11.8* 11.8*  HCT 37.7* 35.1* 36.8* 35.5* 36.6*  MCV 95.9 95.9 96.8 95.9 95.8  PLT 258 261 295 276 280       Component Value Date/Time   SDES URINE, CLEAN CATCH 04/21/2013 1904   SPECREQUEST NONE 04/21/2013 1904   CULT  Value: NO GROWTH Performed at [facility] 04/21/2013 1904   REPTSTATUS 04/22/2013 FINAL 04/21/2013 1904    Recent Labs Lab 08/13/13 0312  TROPONINI 0.43*   CBG:  Recent Labs Lab 08/16/13 2145 08/17/13 1305 08/17/13 1702 08/17/13 2145  08/18/13 0800  GLUCAP 89 80 152* 92 89    Medications: . heparin 1,850 Units/hr (08/18/13 0438)   . amLODipine  5 mg Oral Daily  . aspirin EC  81 mg Oral Daily  . atorvastatin  20 mg Oral q morning - 10a  . calcitRIOL  0.5 mcg Oral Q T,Th,Sa-HD  . carvedilol  6.25 mg Oral BID WC  . ferric gluconate (FERRLECIT/NULECIT) IV  62.5 mg Intravenous Q Thu-HD  . insulin aspart  0-5 Units Subcutaneous QHS  . insulin aspart  0-9 Units Subcutaneous TID WC  .  morphine injection  4 mg Intravenous Once  . multivitamin  1 tablet Oral QHS  . sevelamer carbonate  1,600 mg Oral TID WC  . tamsulosin  0.4 mg Oral QHS   Dialysis Orders: Center: [facility] 4 hr 180 400/800 2k 2Ca EDW 73  New pre-op EDW 71.5-72 kg - venofer 50 per week no Aranesp calcitriol 0.5 TIW Recent labs: Hgb 12.1 7/30 16% sat ferritin 131 P 5.6 Corr Ca 9.8 iPTH 1000--   Assessment/Plan:   NSTEMI - severe 3 vessel disease. CABG for Monday. HD Sat AND Sun in preparation as requested by Dr. Servando Snare  ESRD - TTS  HD. When K less than or equal to 4.2 will use 4K bath given prior rhythm issues (NSVT and flutter)  Anemia - Hb stable. Not on Aranesp. Continue weekly iron.  Secondary hyperparathyroidism - Calcitriol/Renvela. 2Ca bath  HTN/volume - BP soft. At EDW. No vol off with HD today.   Nutrition - Carb mod renal.  DM per primary service  HLD - statin   Jamal Maes, MD [facility] [phone] Pager 08/18/2013, 8:56 AM

## 2013-08-18 NOTE — Progress Notes (Signed)
ANTICOAGULATION CONSULT NOTE - Follow Up Consult  Pharmacy Consult for Heparin  Indication: CAD awaiting CABG  No Known Allergies  Patient Measurements: Height: 5\' 6"  (167.6 cm) Weight: 157 lb 10.1 oz (71.5 kg) IBW/kg (Calculated) : 63.8  Vital Signs: Temp: 98.2 F (36.8 C) (08/09 0321) Temp src: Oral (08/09 0321) BP: 124/65 mmHg (08/09 0045) Pulse Rate: 81 (08/08 1700)  Labs:  Recent Labs  08/16/13 0220  08/16/13 2245 08/17/13 0325 08/18/13 0245 08/18/13 0248  HGB 11.7*  --   --  11.8* 11.8*  --   HCT 36.8*  --   --  35.5* 36.6*  --   PLT 295  --   --  276 280  --   HEPARINUNFRC 0.19*  < > 0.63 0.64 0.79*  --   CREATININE 6.01*  --   --  8.35*  --  6.46*  < > = values in this interval not displayed.  Estimated Creatinine Clearance: 10.2 ml/min (by C-G formula based on Cr of 6.46).  Assessment: 68 yo male with CAD awaiting CABG tomorrow for hepain  Goal of Therapy:  Heparin level 0.3-0.7 units/ml Monitor platelets by anticoagulation protocol: Yes   Plan:  Decrease Heparin 1850 units/hr  Phillis Knack, PharmD, BCPS   08/18/2013 4:04 AM

## 2013-08-18 NOTE — Procedures (Signed)
I have personally supervised and made adjustments to this patient's dialysis session.   Preparing for HD - being done in his room. K 4.3, calcium 10.2 with alb 3.6  - will use 2K/2Ca bath today. Euvolemic - no volume off with HD today Tight heparin  AVF  Jamal Maes, MD [facility] [phone] Pager 08/18/2013, 9:14 AM

## 2013-08-19 ENCOUNTER — Inpatient Hospital Stay (HOSPITAL_COMMUNITY): Payer: Medicare Other

## 2013-08-19 ENCOUNTER — Encounter (HOSPITAL_COMMUNITY)
Admission: EM | Disposition: A | Discharge status: Skilled Nursing Facility | Payer: Medicare Other | Source: Home / Self Care | Attending: Cardiothoracic Surgery

## 2013-08-19 ENCOUNTER — Encounter (HOSPITAL_COMMUNITY): Payer: Self-pay | Admitting: Certified Registered"

## 2013-08-19 ENCOUNTER — Encounter (HOSPITAL_COMMUNITY): Payer: Medicare Other | Admitting: Certified Registered"

## 2013-08-19 ENCOUNTER — Inpatient Hospital Stay (HOSPITAL_COMMUNITY): Payer: Medicare Other | Admitting: Certified Registered"

## 2013-08-19 ENCOUNTER — Encounter (HOSPITAL_COMMUNITY): Payer: Medicare Other

## 2013-08-19 DIAGNOSIS — Z951 Presence of aortocoronary bypass graft: Secondary | ICD-10-CM

## 2013-08-19 HISTORY — PX: ENDOVEIN HARVEST OF GREATER SAPHENOUS VEIN: SHX5059

## 2013-08-19 HISTORY — PX: CORONARY ARTERY BYPASS GRAFT: SHX141

## 2013-08-19 HISTORY — PX: INTRAOPERATIVE TRANSESOPHAGEAL ECHOCARDIOGRAM: SHX5062

## 2013-08-19 LAB — COMPREHENSIVE METABOLIC PANEL
ALBUMIN: 3.3 g/dL — AB (ref 3.5–5.2)
ALK PHOS: 84 U/L (ref 39–117)
ALT: 7 U/L (ref 0–53)
AST: 10 U/L (ref 0–37)
Anion gap: 14 (ref 5–15)
BUN: 17 mg/dL (ref 6–23)
CALCIUM: 9.9 mg/dL (ref 8.4–10.5)
CO2: 30 mEq/L (ref 19–32)
Chloride: 97 mEq/L (ref 96–112)
Creatinine, Ser: 5.36 mg/dL — ABNORMAL HIGH (ref 0.50–1.35)
GFR calc Af Amer: 12 mL/min — ABNORMAL LOW (ref >90)
GFR calc non Af Amer: 10 mL/min — ABNORMAL LOW (ref >90)
Glucose, Bld: 71 mg/dL (ref 70–99)
POTASSIUM: 4 meq/L (ref 3.7–5.3)
Sodium: 141 mEq/L (ref 137–147)
TOTAL PROTEIN: 6.6 g/dL (ref 6.0–8.3)
Total Bilirubin: 0.3 mg/dL (ref 0.3–1.2)

## 2013-08-19 LAB — POCT I-STAT 3, ART BLOOD GAS (G3+)
ACID-BASE DEFICIT: 1 mmol/L (ref 0.0–2.0)
ACID-BASE DEFICIT: 3 mmol/L — AB (ref 0.0–2.0)
Acid-Base Excess: 6 mmol/L — ABNORMAL HIGH (ref 0.0–2.0)
Acid-base deficit: 1 mmol/L (ref 0.0–2.0)
Acid-base deficit: 2 mmol/L (ref 0.0–2.0)
Acid-base deficit: 2 mmol/L (ref 0.0–2.0)
BICARBONATE: 25 meq/L — AB (ref 20.0–24.0)
BICARBONATE: 25.8 meq/L — AB (ref 20.0–24.0)
BICARBONATE: 22.4 meq/L (ref 20.0–24.0)
BICARBONATE: 23.5 meq/L (ref 20.0–24.0)
Bicarbonate: 29.6 mEq/L — ABNORMAL HIGH (ref 20.0–24.0)
Bicarbonate: 22.2 mEq/L (ref 20.0–24.0)
O2 SAT: 100 %
O2 Saturation: 95 %
O2 Saturation: 95 %
O2 Saturation: 97 %
O2 Saturation: 87 %
O2 Saturation: 99 %
PCO2 ART: 40.1 mmHg (ref 35.0–45.0)
PCO2 ART: 36.6 mmHg (ref 35.0–45.0)
PH ART: 7.476 — AB (ref 7.350–7.450)
PH ART: 7.324 — AB (ref 7.350–7.450)
PH ART: 7.359 (ref 7.350–7.450)
PH ART: 7.393 (ref 7.350–7.450)
PH ART: 7.358 (ref 7.350–7.450)
PO2 ART: 81 mmHg (ref 80.0–100.0)
PO2 ART: 84 mmHg (ref 80.0–100.0)
PO2 ART: 144 mmHg — AB (ref 80.0–100.0)
Patient temperature: 37.4
Patient temperature: 37.5
Patient temperature: 37.7
TCO2: 31 mmol/L (ref 0–100)
TCO2: 26 mmol/L (ref 0–100)
TCO2: 27 mmol/L (ref 0–100)
TCO2: 23 mmol/L (ref 0–100)
TCO2: 24 mmol/L (ref 0–100)
TCO2: 25 mmol/L (ref 0–100)
pCO2 arterial: 44.4 mmHg (ref 35.0–45.0)
pCO2 arterial: 49.6 mmHg — ABNORMAL HIGH (ref 35.0–45.0)
pCO2 arterial: 41.9 mmHg (ref 35.0–45.0)
pCO2 arterial: 42.2 mmHg (ref 35.0–45.0)
pH, Arterial: 7.338 — ABNORMAL LOW (ref 7.350–7.450)
pO2, Arterial: 338 mmHg — ABNORMAL HIGH (ref 80.0–100.0)
pO2, Arterial: 96 mmHg (ref 80.0–100.0)
pO2, Arterial: 59 mmHg — ABNORMAL LOW (ref 80.0–100.0)

## 2013-08-19 LAB — POCT I-STAT, CHEM 8
BUN: 17 mg/dL (ref 6–23)
BUN: 16 mg/dL (ref 6–23)
BUN: 17 mg/dL (ref 6–23)
BUN: 18 mg/dL (ref 6–23)
BUN: 18 mg/dL (ref 6–23)
BUN: 21 mg/dL (ref 6–23)
CALCIUM ION: 1.29 mmol/L (ref 1.13–1.30)
CALCIUM ION: 1.35 mmol/L — AB (ref 1.13–1.30)
CALCIUM ION: 1.36 mmol/L — AB (ref 1.13–1.30)
CHLORIDE: 98 meq/L (ref 96–112)
CHLORIDE: 98 meq/L (ref 96–112)
CHLORIDE: 100 meq/L (ref 96–112)
CHLORIDE: 102 meq/L (ref 96–112)
CREATININE: 6.2 mg/dL — AB (ref 0.50–1.35)
CREATININE: 5.8 mg/dL — AB (ref 0.50–1.35)
CREATININE: 6.3 mg/dL — AB (ref 0.50–1.35)
Calcium, Ion: 1.28 mmol/L (ref 1.13–1.30)
Calcium, Ion: 1.17 mmol/L (ref 1.13–1.30)
Calcium, Ion: 1.2 mmol/L (ref 1.13–1.30)
Chloride: 93 mEq/L — ABNORMAL LOW (ref 96–112)
Chloride: 97 mEq/L (ref 96–112)
Creatinine, Ser: 6.4 mg/dL — ABNORMAL HIGH (ref 0.50–1.35)
Creatinine, Ser: 5.9 mg/dL — ABNORMAL HIGH (ref 0.50–1.35)
Creatinine, Ser: 6.4 mg/dL — ABNORMAL HIGH (ref 0.50–1.35)
GLUCOSE: 134 mg/dL — AB (ref 70–99)
Glucose, Bld: 88 mg/dL (ref 70–99)
Glucose, Bld: 107 mg/dL — ABNORMAL HIGH (ref 70–99)
Glucose, Bld: 91 mg/dL (ref 70–99)
Glucose, Bld: 143 mg/dL — ABNORMAL HIGH (ref 70–99)
Glucose, Bld: 120 mg/dL — ABNORMAL HIGH (ref 70–99)
HCT: 31 % — ABNORMAL LOW (ref 39.0–52.0)
HCT: 24 % — ABNORMAL LOW (ref 39.0–52.0)
HCT: 28 % — ABNORMAL LOW (ref 39.0–52.0)
HCT: 26 % — ABNORMAL LOW (ref 39.0–52.0)
HCT: 30 % — ABNORMAL LOW (ref 39.0–52.0)
HEMATOCRIT: 26 % — AB (ref 39.0–52.0)
Hemoglobin: 10.5 g/dL — ABNORMAL LOW (ref 13.0–17.0)
Hemoglobin: 8.2 g/dL — ABNORMAL LOW (ref 13.0–17.0)
Hemoglobin: 9.5 g/dL — ABNORMAL LOW (ref 13.0–17.0)
Hemoglobin: 8.8 g/dL — ABNORMAL LOW (ref 13.0–17.0)
Hemoglobin: 8.8 g/dL — ABNORMAL LOW (ref 13.0–17.0)
Hemoglobin: 10.2 g/dL — ABNORMAL LOW (ref 13.0–17.0)
POTASSIUM: 4 meq/L (ref 3.7–5.3)
POTASSIUM: 3.8 meq/L (ref 3.7–5.3)
POTASSIUM: 4 meq/L (ref 3.7–5.3)
POTASSIUM: 4.3 meq/L (ref 3.7–5.3)
POTASSIUM: 3.9 meq/L (ref 3.7–5.3)
POTASSIUM: 5.3 meq/L (ref 3.7–5.3)
SODIUM: 136 meq/L — AB (ref 137–147)
SODIUM: 136 meq/L — AB (ref 137–147)
Sodium: 134 mEq/L — ABNORMAL LOW (ref 137–147)
Sodium: 134 mEq/L — ABNORMAL LOW (ref 137–147)
Sodium: 136 mEq/L — ABNORMAL LOW (ref 137–147)
Sodium: 134 mEq/L — ABNORMAL LOW (ref 137–147)
TCO2: 30 mmol/L (ref 0–100)
TCO2: 27 mmol/L (ref 0–100)
TCO2: 28 mmol/L (ref 0–100)
TCO2: 26 mmol/L (ref 0–100)
TCO2: 23 mmol/L (ref 0–100)
TCO2: 21 mmol/L (ref 0–100)

## 2013-08-19 LAB — CBC
HCT: 33.4 % — ABNORMAL LOW (ref 39.0–52.0)
HCT: 28.9 % — ABNORMAL LOW (ref 39.0–52.0)
HEMATOCRIT: 27.3 % — AB (ref 39.0–52.0)
Hemoglobin: 10.6 g/dL — ABNORMAL LOW (ref 13.0–17.0)
Hemoglobin: 9.1 g/dL — ABNORMAL LOW (ref 13.0–17.0)
Hemoglobin: 9.5 g/dL — ABNORMAL LOW (ref 13.0–17.0)
MCH: 30.5 pg (ref 26.0–34.0)
MCH: 31.9 pg (ref 26.0–34.0)
MCH: 31.5 pg (ref 26.0–34.0)
MCHC: 31.7 g/dL (ref 30.0–36.0)
MCHC: 33.3 g/dL (ref 30.0–36.0)
MCHC: 32.9 g/dL (ref 30.0–36.0)
MCV: 96 fL (ref 78.0–100.0)
MCV: 95.8 fL (ref 78.0–100.0)
MCV: 95.7 fL (ref 78.0–100.0)
PLATELETS: 232 10*3/uL (ref 150–400)
Platelets: 134 10*3/uL — ABNORMAL LOW (ref 150–400)
Platelets: 202 10*3/uL (ref 150–400)
RBC: 3.48 MIL/uL — ABNORMAL LOW (ref 4.22–5.81)
RBC: 2.85 MIL/uL — ABNORMAL LOW (ref 4.22–5.81)
RBC: 3.02 MIL/uL — ABNORMAL LOW (ref 4.22–5.81)
RDW: 14.7 % (ref 11.5–15.5)
RDW: 14.6 % (ref 11.5–15.5)
RDW: 14.8 % (ref 11.5–15.5)
WBC: 7.7 10*3/uL (ref 4.0–10.5)
WBC: 11.1 10*3/uL — ABNORMAL HIGH (ref 4.0–10.5)
WBC: 15.6 10*3/uL — ABNORMAL HIGH (ref 4.0–10.5)

## 2013-08-19 LAB — MAGNESIUM: Magnesium: 2.2 mg/dL (ref 1.5–2.5)

## 2013-08-19 LAB — HEMOGLOBIN A1C
Hgb A1c MFr Bld: 5.9 % — ABNORMAL HIGH (ref <5.7)
Mean Plasma Glucose: 123 mg/dL — ABNORMAL HIGH (ref <117)

## 2013-08-19 LAB — PLATELET COUNT
PLATELETS: 182 10*3/uL (ref 150–400)
Platelets: 126 10*3/uL — ABNORMAL LOW (ref 150–400)

## 2013-08-19 LAB — APTT: aPTT: 36 seconds (ref 24–37)

## 2013-08-19 LAB — GLUCOSE, CAPILLARY
GLUCOSE-CAPILLARY: 80 mg/dL (ref 70–99)
GLUCOSE-CAPILLARY: 116 mg/dL — AB (ref 70–99)
Glucose-Capillary: 85 mg/dL (ref 70–99)
Glucose-Capillary: 107 mg/dL — ABNORMAL HIGH (ref 70–99)
Glucose-Capillary: 115 mg/dL — ABNORMAL HIGH (ref 70–99)
Glucose-Capillary: 114 mg/dL — ABNORMAL HIGH (ref 70–99)
Glucose-Capillary: 117 mg/dL — ABNORMAL HIGH (ref 70–99)

## 2013-08-19 LAB — CREATININE, SERUM
Creatinine, Ser: 5.95 mg/dL — ABNORMAL HIGH (ref 0.50–1.35)
GFR calc Af Amer: 10 mL/min — ABNORMAL LOW (ref >90)
GFR calc non Af Amer: 9 mL/min — ABNORMAL LOW (ref >90)

## 2013-08-19 LAB — HEMOGLOBIN AND HEMATOCRIT, BLOOD
HCT: 25.8 % — ABNORMAL LOW (ref 39.0–52.0)
HCT: 26.4 % — ABNORMAL LOW (ref 39.0–52.0)
Hemoglobin: 8.4 g/dL — ABNORMAL LOW (ref 13.0–17.0)
Hemoglobin: 8.6 g/dL — ABNORMAL LOW (ref 13.0–17.0)

## 2013-08-19 LAB — HEPARIN LEVEL (UNFRACTIONATED): HEPARIN UNFRACTIONATED: 0.63 [IU]/mL (ref 0.30–0.70)

## 2013-08-19 LAB — POCT I-STAT 4, (NA,K, GLUC, HGB,HCT)
GLUCOSE: 129 mg/dL — AB (ref 70–99)
HEMATOCRIT: 26 % — AB (ref 39.0–52.0)
HEMOGLOBIN: 8.8 g/dL — AB (ref 13.0–17.0)
Potassium: 3.7 mEq/L (ref 3.7–5.3)
Sodium: 137 mEq/L (ref 137–147)

## 2013-08-19 LAB — PREPARE RBC (CROSSMATCH)

## 2013-08-19 LAB — POCT I-STAT GLUCOSE
Glucose, Bld: 87 mg/dL (ref 70–99)
OPERATOR ID: 156951

## 2013-08-19 LAB — PROTIME-INR
INR: 1.28 (ref 0.00–1.49)
Prothrombin Time: 16 seconds — ABNORMAL HIGH (ref 11.6–15.2)

## 2013-08-19 SURGERY — CORONARY ARTERY BYPASS GRAFTING (CABG)
Anesthesia: General | Site: Chest

## 2013-08-19 MED ORDER — ATORVASTATIN CALCIUM 20 MG PO TABS
20.0000 mg | ORAL_TABLET | Freq: Every morning | ORAL | Status: DC
  Administered 2013-08-20 – 2013-08-22 (×3): 20 mg via ORAL
  Filled 2013-08-19 (×4): qty 1

## 2013-08-19 MED ORDER — SODIUM CHLORIDE 0.9 % IV SOLN
INTRAVENOUS | Status: DC | PRN
  Administered 2013-08-19: 07:00:00 via INTRAVENOUS

## 2013-08-19 MED ORDER — PROPOFOL 10 MG/ML IV BOLUS
INTRAVENOUS | Status: AC
  Filled 2013-08-19: qty 20

## 2013-08-19 MED ORDER — MIDAZOLAM HCL 10 MG/2ML IJ SOLN
INTRAMUSCULAR | Status: AC
  Filled 2013-08-19: qty 2

## 2013-08-19 MED ORDER — LEVALBUTEROL HCL 0.63 MG/3ML IN NEBU
0.6300 mg | INHALATION_SOLUTION | Freq: Four times a day (QID) | RESPIRATORY_TRACT | Status: DC
  Administered 2013-08-19 – 2013-08-22 (×11): 0.63 mg via RESPIRATORY_TRACT
  Filled 2013-08-19 (×19): qty 3

## 2013-08-19 MED ORDER — DOPAMINE-DEXTROSE 3.2-5 MG/ML-% IV SOLN: 0.0000 ug/kg/min | INTRAVENOUS | Status: DC

## 2013-08-19 MED ORDER — CHLORHEXIDINE GLUCONATE 0.12 % MT SOLN
15.0000 mL | Freq: Two times a day (BID) | OROMUCOSAL | Status: DC
  Administered 2013-08-19: 15 mL via OROMUCOSAL
  Filled 2013-08-19: qty 15

## 2013-08-19 MED ORDER — SUFENTANIL CITRATE 250 MCG/5ML IV SOLN
INTRAVENOUS | Status: AC
  Filled 2013-08-19: qty 5

## 2013-08-19 MED ORDER — PROTAMINE SULFATE 10 MG/ML IV SOLN
INTRAVENOUS | Status: DC | PRN
  Administered 2013-08-19 (×2): 125 mg via INTRAVENOUS

## 2013-08-19 MED ORDER — VECURONIUM BROMIDE 10 MG IV SOLR
INTRAVENOUS | Status: DC | PRN
  Administered 2013-08-19: 10 mg via INTRAVENOUS
  Administered 2013-08-19 (×2): 2 mg via INTRAVENOUS
  Administered 2013-08-19: 3 mg via INTRAVENOUS
  Administered 2013-08-19: 2 mg via INTRAVENOUS

## 2013-08-19 MED ORDER — SODIUM CHLORIDE 0.9 % IV SOLN
100.0000 [IU] | INTRAVENOUS | Status: DC | PRN
  Administered 2013-08-19: .8 [IU]/h via INTRAVENOUS

## 2013-08-19 MED ORDER — CALCIUM CHLORIDE 10 % IV SOLN
INTRAVENOUS | Status: DC | PRN
  Administered 2013-08-19 (×3): 200 mg via INTRAVENOUS

## 2013-08-19 MED ORDER — MAGNESIUM SULFATE 4000MG/100ML IJ SOLN
4.0000 g | Freq: Once | INTRAMUSCULAR | Status: DC
  Filled 2013-08-19: qty 100

## 2013-08-19 MED ORDER — DOCUSATE SODIUM 100 MG PO CAPS
200.0000 mg | ORAL_CAPSULE | Freq: Every day | ORAL | Status: DC
  Administered 2013-08-20 – 2013-08-21 (×2): 200 mg via ORAL
  Filled 2013-08-19 (×2): qty 2

## 2013-08-19 MED ORDER — ALBUMIN HUMAN 5 % IV SOLN: 250.0000 mL | INTRAVENOUS | Status: AC | PRN

## 2013-08-19 MED ORDER — VANCOMYCIN HCL IN DEXTROSE 1-5 GM/200ML-% IV SOLN
1000.0000 mg | Freq: Once | INTRAVENOUS | Status: AC
  Administered 2013-08-19: 1000 mg via INTRAVENOUS
  Filled 2013-08-19: qty 200

## 2013-08-19 MED ORDER — SODIUM CHLORIDE 0.9 % IV SOLN
INTRAVENOUS | Status: DC | PRN
  Administered 2013-08-19: 13:00:00 via INTRAVENOUS

## 2013-08-19 MED ORDER — METOPROLOL TARTRATE 1 MG/ML IV SOLN: 2.5000 mg | INTRAVENOUS | Status: DC | PRN

## 2013-08-19 MED ORDER — 0.9 % SODIUM CHLORIDE (POUR BTL) OPTIME
TOPICAL | Status: DC | PRN
  Administered 2013-08-19: 1000 mL

## 2013-08-19 MED ORDER — METOPROLOL TARTRATE 12.5 MG HALF TABLET
12.5000 mg | ORAL_TABLET | Freq: Two times a day (BID) | ORAL | Status: DC
  Administered 2013-08-21: 12.5 mg via ORAL
  Filled 2013-08-19 (×7): qty 1

## 2013-08-19 MED ORDER — SODIUM CHLORIDE 0.9 % IJ SOLN
3.0000 mL | Freq: Two times a day (BID) | INTRAMUSCULAR | Status: DC
  Administered 2013-08-20 – 2013-08-21 (×2): 3 mL via INTRAVENOUS

## 2013-08-19 MED ORDER — HEPARIN SODIUM (PORCINE) 1000 UNIT/ML IJ SOLN
INTRAMUSCULAR | Status: AC
  Filled 2013-08-19: qty 1

## 2013-08-19 MED ORDER — SODIUM CHLORIDE 0.45 % IV SOLN
INTRAVENOUS | Status: DC
  Administered 2013-08-19: 14:00:00 via INTRAVENOUS

## 2013-08-19 MED ORDER — ASPIRIN 81 MG PO CHEW: 324.0000 mg | CHEWABLE_TABLET | Freq: Every day | ORAL | Status: DC

## 2013-08-19 MED ORDER — SODIUM CHLORIDE 0.9 % IV SOLN
INTRAVENOUS | Status: DC
  Administered 2013-08-19: 0.5 [IU]/h via INTRAVENOUS
  Filled 2013-08-19: qty 1

## 2013-08-19 MED ORDER — MILRINONE IN DEXTROSE 20 MG/100ML IV SOLN
0.1250 ug/kg/min | INTRAVENOUS | Status: DC
  Filled 2013-08-19: qty 100

## 2013-08-19 MED ORDER — ALBUTEROL SULFATE HFA 108 (90 BASE) MCG/ACT IN AERS
INHALATION_SPRAY | RESPIRATORY_TRACT | Status: DC | PRN
  Administered 2013-08-19: 4 via RESPIRATORY_TRACT

## 2013-08-19 MED ORDER — ARTIFICIAL TEARS OP OINT
TOPICAL_OINTMENT | OPHTHALMIC | Status: AC
  Filled 2013-08-19: qty 3.5

## 2013-08-19 MED ORDER — ONDANSETRON HCL 4 MG/2ML IJ SOLN: 4.0000 mg | Freq: Four times a day (QID) | INTRAMUSCULAR | Status: DC | PRN

## 2013-08-19 MED ORDER — LACTATED RINGERS IV SOLN: 500.0000 mL | Freq: Once | INTRAVENOUS | Status: AC | PRN

## 2013-08-19 MED ORDER — ACETAMINOPHEN 650 MG RE SUPP: 650.0000 mg | Freq: Once | RECTAL | Status: AC

## 2013-08-19 MED ORDER — POTASSIUM CHLORIDE 10 MEQ/50ML IV SOLN: 10.0000 meq | INTRAVENOUS | Status: AC

## 2013-08-19 MED ORDER — ACETAMINOPHEN 500 MG PO TABS
1000.0000 mg | ORAL_TABLET | Freq: Four times a day (QID) | ORAL | Status: DC
  Administered 2013-08-19 – 2013-08-22 (×9): 1000 mg via ORAL
  Filled 2013-08-19 (×14): qty 2

## 2013-08-19 MED ORDER — VECURONIUM BROMIDE 10 MG IV SOLR
INTRAVENOUS | Status: AC
  Filled 2013-08-19: qty 10

## 2013-08-19 MED ORDER — SODIUM CHLORIDE 0.9 % IJ SOLN: 3.0000 mL | INTRAMUSCULAR | Status: DC | PRN

## 2013-08-19 MED ORDER — DOPAMINE-DEXTROSE 3.2-5 MG/ML-% IV SOLN
INTRAVENOUS | Status: DC | PRN
  Administered 2013-08-19: 3 ug/kg/min via INTRAVENOUS

## 2013-08-19 MED ORDER — INSULIN REGULAR BOLUS VIA INFUSION
0.0000 [IU] | Freq: Three times a day (TID) | INTRAVENOUS | Status: DC
  Filled 2013-08-19: qty 10

## 2013-08-19 MED ORDER — NITROGLYCERIN IN D5W 200-5 MCG/ML-% IV SOLN: 0.0000 ug/min | INTRAVENOUS | Status: DC

## 2013-08-19 MED ORDER — ALBUMIN HUMAN 5 % IV SOLN
INTRAVENOUS | Status: DC | PRN
  Administered 2013-08-19: 12:00:00 via INTRAVENOUS

## 2013-08-19 MED ORDER — MIDAZOLAM HCL 2 MG/2ML IJ SOLN: 2.0000 mg | INTRAMUSCULAR | Status: DC | PRN

## 2013-08-19 MED ORDER — PANTOPRAZOLE SODIUM 40 MG PO TBEC
40.0000 mg | DELAYED_RELEASE_TABLET | Freq: Every day | ORAL | Status: DC
  Administered 2013-08-21: 40 mg via ORAL
  Filled 2013-08-19: qty 1

## 2013-08-19 MED ORDER — SODIUM CHLORIDE 0.9 % IV SOLN
10.0000 g | INTRAVENOUS | Status: DC | PRN
  Administered 2013-08-19: 5 g/h via INTRAVENOUS

## 2013-08-19 MED ORDER — SUFENTANIL CITRATE 50 MCG/ML IV SOLN
INTRAVENOUS | Status: DC | PRN
  Administered 2013-08-19: 50 ug via INTRAVENOUS
  Administered 2013-08-19: 20 ug via INTRAVENOUS
  Administered 2013-08-19: 10 ug via INTRAVENOUS
  Administered 2013-08-19: 30 ug via INTRAVENOUS
  Administered 2013-08-19: 10 ug via INTRAVENOUS
  Administered 2013-08-19 (×2): 50 ug via INTRAVENOUS

## 2013-08-19 MED ORDER — CETYLPYRIDINIUM CHLORIDE 0.05 % MT LIQD
7.0000 mL | Freq: Four times a day (QID) | OROMUCOSAL | Status: DC
  Administered 2013-08-19 – 2013-08-20 (×4): 7 mL via OROMUCOSAL

## 2013-08-19 MED ORDER — PHENYLEPHRINE HCL 10 MG/ML IJ SOLN
20.0000 mg | INTRAVENOUS | Status: DC | PRN
  Administered 2013-08-19: 80 ug/min via INTRAVENOUS

## 2013-08-19 MED ORDER — NITROGLYCERIN IN D5W 200-5 MCG/ML-% IV SOLN
INTRAVENOUS | Status: DC | PRN
  Administered 2013-08-19: 5 ug/min via INTRAVENOUS

## 2013-08-19 MED ORDER — ASPIRIN EC 325 MG PO TBEC
325.0000 mg | DELAYED_RELEASE_TABLET | Freq: Every day | ORAL | Status: DC
  Administered 2013-08-20 – 2013-08-21 (×2): 325 mg via ORAL
  Filled 2013-08-19 (×3): qty 1

## 2013-08-19 MED ORDER — PROTAMINE SULFATE 10 MG/ML IV SOLN
INTRAVENOUS | Status: AC
  Filled 2013-08-19: qty 25

## 2013-08-19 MED ORDER — DEXTROSE 5 % IV SOLN
1.5000 g | Freq: Two times a day (BID) | INTRAVENOUS | Status: DC
  Administered 2013-08-19 – 2013-08-20 (×2): 1.5 g via INTRAVENOUS
  Filled 2013-08-19 (×3): qty 1.5

## 2013-08-19 MED ORDER — PROPOFOL 10 MG/ML IV BOLUS
INTRAVENOUS | Status: DC | PRN
  Administered 2013-08-19: 40 mg via INTRAVENOUS

## 2013-08-19 MED ORDER — ACETAMINOPHEN 160 MG/5ML PO SOLN
650.0000 mg | Freq: Once | ORAL | Status: AC
Start: 2013-08-19 — End: 2013-08-19
  Administered 2013-08-19: 650 mg

## 2013-08-19 MED ORDER — SUCCINYLCHOLINE CHLORIDE 20 MG/ML IJ SOLN
INTRAMUSCULAR | Status: AC
Start: 2013-08-19 — End: 2013-08-19
  Filled 2013-08-19: qty 1

## 2013-08-19 MED ORDER — ARTIFICIAL TEARS OP OINT
TOPICAL_OINTMENT | OPHTHALMIC | Status: DC | PRN
  Administered 2013-08-19: 1 via OPHTHALMIC

## 2013-08-19 MED ORDER — ACETAMINOPHEN 160 MG/5ML PO SOLN: 1000.0000 mg | Freq: Four times a day (QID) | ORAL | Status: DC

## 2013-08-19 MED ORDER — LACTATED RINGERS IV SOLN: INTRAVENOUS | Status: DC

## 2013-08-19 MED ORDER — SODIUM CHLORIDE 0.9 % IV SOLN: INTRAVENOUS | Status: DC

## 2013-08-19 MED ORDER — DEXMEDETOMIDINE HCL IN NACL 200 MCG/50ML IV SOLN
INTRAVENOUS | Status: DC | PRN
  Administered 2013-08-19: 0.2 ug/kg/h via INTRAVENOUS

## 2013-08-19 MED ORDER — BISACODYL 5 MG PO TBEC
10.0000 mg | DELAYED_RELEASE_TABLET | Freq: Every day | ORAL | Status: DC
  Administered 2013-08-20 – 2013-08-21 (×2): 10 mg via ORAL
  Filled 2013-08-19 (×2): qty 2

## 2013-08-19 MED ORDER — HEMOSTATIC AGENTS (NO CHARGE) OPTIME
TOPICAL | Status: DC | PRN
  Administered 2013-08-19: 1 via TOPICAL

## 2013-08-19 MED ORDER — MILRINONE IN DEXTROSE 20 MG/100ML IV SOLN
INTRAVENOUS | Status: DC | PRN
  Administered 2013-08-19: .3 ug/kg/min via INTRAVENOUS

## 2013-08-19 MED ORDER — METOPROLOL TARTRATE 25 MG/10 ML ORAL SUSPENSION
12.5000 mg | Freq: Two times a day (BID) | ORAL | Status: DC
  Filled 2013-08-19 (×7): qty 5

## 2013-08-19 MED ORDER — MIDAZOLAM HCL 5 MG/5ML IJ SOLN
INTRAMUSCULAR | Status: DC | PRN
  Administered 2013-08-19: 2 mg via INTRAVENOUS
  Administered 2013-08-19: 3 mg via INTRAVENOUS
  Administered 2013-08-19: 2 mg via INTRAVENOUS
  Administered 2013-08-19: 3 mg via INTRAVENOUS

## 2013-08-19 MED ORDER — OXYCODONE HCL 5 MG PO TABS
5.0000 mg | ORAL_TABLET | ORAL | Status: DC | PRN
  Administered 2013-08-19 – 2013-08-21 (×5): 10 mg via ORAL
  Administered 2013-08-22: 5 mg via ORAL
  Filled 2013-08-19 (×5): qty 2
  Filled 2013-08-19: qty 1

## 2013-08-19 MED ORDER — HEPARIN SODIUM (PORCINE) 1000 UNIT/ML IJ SOLN
INTRAMUSCULAR | Status: DC | PRN
  Administered 2013-08-19: 35000 [IU] via INTRAVENOUS

## 2013-08-19 MED ORDER — BISACODYL 10 MG RE SUPP: 10.0000 mg | Freq: Every day | RECTAL | Status: DC

## 2013-08-19 MED ORDER — MILRINONE IN DEXTROSE 20 MG/100ML IV SOLN: 0.3000 ug/kg/min | INTRAVENOUS | Status: DC

## 2013-08-19 MED ORDER — DEXTROSE 5 % IV SOLN
0.0000 ug/min | INTRAVENOUS | Status: DC
  Administered 2013-08-19: 60 ug/min via INTRAVENOUS
  Administered 2013-08-20: 40 ug/min via INTRAVENOUS
  Administered 2013-08-20: 50 ug/min via INTRAVENOUS
  Administered 2013-08-21: 30 ug/min via INTRAVENOUS
  Filled 2013-08-19 (×4): qty 2

## 2013-08-19 MED ORDER — DEXMEDETOMIDINE HCL IN NACL 200 MCG/50ML IV SOLN
0.1000 ug/kg/h | INTRAVENOUS | Status: DC
  Administered 2013-08-19: 0.5 ug/kg/h via INTRAVENOUS
  Filled 2013-08-19: qty 50

## 2013-08-19 MED ORDER — MORPHINE SULFATE 2 MG/ML IJ SOLN
2.0000 mg | INTRAMUSCULAR | Status: DC | PRN
  Administered 2013-08-19 (×2): 2 mg via INTRAVENOUS
  Administered 2013-08-19 – 2013-08-20 (×5): 4 mg via INTRAVENOUS
  Administered 2013-08-20 – 2013-08-21 (×2): 2 mg via INTRAVENOUS
  Administered 2013-08-21: 4 mg via INTRAVENOUS
  Administered 2013-08-21: 2 mg via INTRAVENOUS
  Filled 2013-08-19 (×2): qty 2
  Filled 2013-08-19 (×2): qty 1
  Filled 2013-08-19 (×2): qty 2
  Filled 2013-08-19: qty 1
  Filled 2013-08-19: qty 2
  Filled 2013-08-19 (×2): qty 1
  Filled 2013-08-19: qty 2
  Filled 2013-08-19: qty 1

## 2013-08-19 MED ORDER — SODIUM CHLORIDE 0.9 % IJ SOLN
OROMUCOSAL | Status: DC | PRN
  Administered 2013-08-19: 09:00:00 via TOPICAL

## 2013-08-19 MED ORDER — MORPHINE SULFATE 2 MG/ML IJ SOLN
1.0000 mg | INTRAMUSCULAR | Status: AC | PRN
  Administered 2013-08-19 (×2): 2 mg via INTRAVENOUS
  Filled 2013-08-19: qty 2

## 2013-08-19 MED ORDER — FAMOTIDINE IN NACL 20-0.9 MG/50ML-% IV SOLN
20.0000 mg | INTRAVENOUS | Status: AC
  Administered 2013-08-19 – 2013-08-20 (×2): 20 mg via INTRAVENOUS
  Filled 2013-08-19: qty 50

## 2013-08-19 MED ORDER — SODIUM CHLORIDE 0.9 % IV SOLN
250.0000 mL | INTRAVENOUS | Status: DC
Start: 2013-08-20 — End: 2013-08-22

## 2013-08-19 SURGICAL SUPPLY — 71 items
ADH SKN CLS APL DERMABOND .7 (GAUZE/BANDAGES/DRESSINGS) ×6
ATTRACTOMAT 16X20 MAGNETIC DRP (DRAPES) ×4 IMPLANT
BAG DECANTER FOR FLEXI CONT (MISCELLANEOUS) ×4 IMPLANT
BANDAGE ELASTIC 4 VELCRO ST LF (GAUZE/BANDAGES/DRESSINGS) ×4 IMPLANT
BANDAGE ELASTIC 6 VELCRO ST LF (GAUZE/BANDAGES/DRESSINGS) ×4 IMPLANT
BLADE STERNUM SYSTEM 6 (BLADE) ×4 IMPLANT
BNDG GAUZE ELAST 4 BULKY (GAUZE/BANDAGES/DRESSINGS) ×4 IMPLANT
CANISTER SUCTION 2500CC (MISCELLANEOUS) ×4 IMPLANT
CARDIAC SUCTION (MISCELLANEOUS) ×4 IMPLANT
CATH CPB KIT GERHARDT (MISCELLANEOUS) ×4 IMPLANT
CATH THORACIC 28FR (CATHETERS) ×4 IMPLANT
COVER SURGICAL LIGHT HANDLE (MISCELLANEOUS) ×4 IMPLANT
CRADLE DONUT ADULT HEAD (MISCELLANEOUS) ×4 IMPLANT
DERMABOND ADVANCED (GAUZE/BANDAGES/DRESSINGS) ×2
DERMABOND ADVANCED .7 DNX12 (GAUZE/BANDAGES/DRESSINGS) IMPLANT
DRAIN CHANNEL 28F RND 3/8 FF (WOUND CARE) ×4 IMPLANT
DRAPE CARDIOVASCULAR INCISE (DRAPES) ×4
DRAPE SLUSH/WARMER DISC (DRAPES) ×4 IMPLANT
DRAPE SRG 135X102X78XABS (DRAPES) ×3 IMPLANT
DRSG AQUACEL AG ADV 3.5X14 (GAUZE/BANDAGES/DRESSINGS) ×4 IMPLANT
ELECT BLADE 4.0 EZ CLEAN MEGAD (MISCELLANEOUS) ×4
ELECT REM PT RETURN 9FT ADLT (ELECTROSURGICAL) ×8
ELECTRODE BLDE 4.0 EZ CLN MEGD (MISCELLANEOUS) ×3 IMPLANT
ELECTRODE REM PT RTRN 9FT ADLT (ELECTROSURGICAL) ×6 IMPLANT
GAUZE SPONGE 4X4 12PLY STRL (GAUZE/BANDAGES/DRESSINGS) ×8 IMPLANT
GLOVE BIO SURGEON STRL SZ 6.5 (GLOVE) ×13 IMPLANT
GLOVE BIOGEL PI IND STRL 6.5 (GLOVE) IMPLANT
GLOVE BIOGEL PI IND STRL 7.0 (GLOVE) IMPLANT
GLOVE BIOGEL PI INDICATOR 6.5 (GLOVE) ×5
GLOVE BIOGEL PI INDICATOR 7.0 (GLOVE) ×2
GOWN STRL REUS W/ TWL LRG LVL3 (GOWN DISPOSABLE) ×12 IMPLANT
GOWN STRL REUS W/TWL LRG LVL3 (GOWN DISPOSABLE) ×16
HEMOSTAT POWDER SURGIFOAM 1G (HEMOSTASIS) ×12 IMPLANT
HEMOSTAT SURGICEL 2X14 (HEMOSTASIS) ×4 IMPLANT
KIT BASIN OR (CUSTOM PROCEDURE TRAY) ×4 IMPLANT
KIT ROOM TURNOVER OR (KITS) ×4 IMPLANT
KIT SUCTION CATH 14FR (SUCTIONS) ×8 IMPLANT
KIT VASOVIEW W/TROCAR VH 2000 (KITS) ×4 IMPLANT
LEAD PACING MYOCARDI (MISCELLANEOUS) ×4 IMPLANT
MARKER GRAFT CORONARY BYPASS (MISCELLANEOUS) ×12 IMPLANT
NS IRRIG 1000ML POUR BTL (IV SOLUTION) ×20 IMPLANT
PACK OPEN HEART (CUSTOM PROCEDURE TRAY) ×4 IMPLANT
PAD ARMBOARD 7.5X6 YLW CONV (MISCELLANEOUS) ×8 IMPLANT
PAD ELECT DEFIB RADIOL ZOLL (MISCELLANEOUS) ×4 IMPLANT
PENCIL BUTTON HOLSTER BLD 10FT (ELECTRODE) ×4 IMPLANT
PUNCH AORTIC ROTATE  4.5MM 8IN (MISCELLANEOUS) ×1 IMPLANT
SET CARDIOPLEGIA MPS 5001102 (MISCELLANEOUS) ×1 IMPLANT
SPONGE GAUZE 4X4 12PLY STER LF (GAUZE/BANDAGES/DRESSINGS) ×2 IMPLANT
SPONGE LAP 18X18 X RAY DECT (DISPOSABLE) ×2 IMPLANT
SUT BONE WAX W31G (SUTURE) ×4 IMPLANT
SUT PROLENE 3 0 SH1 36 (SUTURE) ×4 IMPLANT
SUT PROLENE 4 0 TF (SUTURE) ×8 IMPLANT
SUT PROLENE 6 0 C 1 30 (SUTURE) ×1 IMPLANT
SUT PROLENE 6 0 CC (SUTURE) ×11 IMPLANT
SUT PROLENE 7 0 BV1 MDA (SUTURE) ×5 IMPLANT
SUT PROLENE 8 0 BV175 6 (SUTURE) ×3 IMPLANT
SUT STEEL 6MS V (SUTURE) ×4 IMPLANT
SUT STEEL SZ 6 DBL 3X14 BALL (SUTURE) ×4 IMPLANT
SUT VIC AB 1 CTX 18 (SUTURE) ×8 IMPLANT
SUT VIC AB 2-0 CT1 36 (SUTURE) ×2 IMPLANT
SUT VIC AB 3-0 X1 27 (SUTURE) ×2 IMPLANT
SUTURE E-PAK OPEN HEART (SUTURE) ×4 IMPLANT
SYSTEM SAHARA CHEST DRAIN ATS (WOUND CARE) ×4 IMPLANT
TAPE CLOTH SURG 4X10 WHT LF (GAUZE/BANDAGES/DRESSINGS) ×2 IMPLANT
TOWEL OR 17X24 6PK STRL BLUE (TOWEL DISPOSABLE) ×8 IMPLANT
TOWEL OR 17X26 10 PK STRL BLUE (TOWEL DISPOSABLE) ×8 IMPLANT
TRAY FOLEY IC TEMP SENS 16FR (CATHETERS) ×4 IMPLANT
TUBE FEEDING 8FR 16IN STR KANG (MISCELLANEOUS) ×4 IMPLANT
TUBING INSUFFLATION 10FT LAP (TUBING) ×4 IMPLANT
UNDERPAD 30X30 INCONTINENT (UNDERPADS AND DIAPERS) ×4 IMPLANT
WATER STERILE IRR 1000ML POUR (IV SOLUTION) ×8 IMPLANT

## 2013-08-19 NOTE — Procedures (Signed)
Extubation Procedure Note  Patient Details:   Name: [name] [name] DOB: 09/06/1945 MRN: [id number]   Airway Documentation:     Evaluation  O2 sats: stable throughout Complications: No apparent complications Patient did tolerate procedure well. Bilateral Breath Sounds: Clear Suctioning: Airway Yes  Patient extubated to [place].  NIF of -20, VC of 1L.  IS performed x5 total of 500.  Vitals currently stable.  Positive cuff leak.  Able to speak after.  Alphia Moh N 08/19/2013, 6:18 PM

## 2013-08-19 NOTE — Progress Notes (Signed)
Extubated  BP 93/54  Pulse 88  Temp(Src) 99.5 F (37.5 C) (Oral)  Resp 24  Ht 5' 4.96" (1.65 m)  Wt 163 lb 2.3 oz (74 kg)  BMI 27.18 kg/m2  SpO2 90% PA= 43/20 CI= 3.4  Intake/Output Summary (Last 24 hours) at 08/19/13 1853 Last data filed at 08/19/13 1800  Gross per 24 hour  Intake 3074.6 ml  Output    291 ml  Net 2783.6 ml   K= 3.7, Hct 26

## 2013-08-19 NOTE — Transfer of Care (Signed)
Immediate Anesthesia Transfer of Care Note  Patient: [name] [name]  Procedure(s) Performed: Procedure(s): CORONARY ARTERY BYPASS GRAFTING (CABG) times 5 using left internal mammary artery to LAD, saphenous vein graft to diagonal; sequential saphenous vein graft to intermediate and distal circumflex; and saphenous vein graft to right coronary artery  (N/A) INTRAOPERATIVE TRANSESOPHAGEAL ECHOCARDIOGRAM (N/A) ENDOVEIN HARVEST OF GREATER SAPHENOUS VEIN from left thigh and calf (Left)  Patient Location: ICU  Anesthesia Type:General  Level of Consciousness: unresponsive and Patient remains intubated per anesthesia plan  Airway & Oxygen Therapy: Patient remains intubated per anesthesia plan and Patient placed on Ventilator (see vital sign flow sheet for setting)  Post-op Assessment: Report given to PACU RN and Post -op Vital signs reviewed and stable  Post vital signs: Reviewed and stable  Complications: No apparent anesthesia complications

## 2013-08-19 NOTE — Progress Notes (Signed)
[facility] Progress Note   Subjective: on vent, post op CABG , came up to unit about 1:30pm, still extubated and BP's have been up and down  Filed Vitals:   08/19/13 1544 08/19/13 1545 08/19/13 1600 08/19/13 1615  BP: 126/58 103/64 100/64 100/66  Pulse: 88   93  Temp: 98.2 F (36.8 C) 98.4 F (36.9 C) 98.6 F (37 C) 98.8 F (37.1 C)  TempSrc:      Resp: 14 14 14 24   Height:      Weight:      SpO2: 100%   100%   Exam: On vent, opens eyes to loud voice No jvd Chest clear ant and lat, CT in place RRR 1/6 SEM no RG Abd soft, NTND No LE or UE edema Neuro is sedated on the vent Left arm AVF +bruit  [facility]   F180   400/800   2/2.0 Bath   73kg  Heparin 4000 Calcitriol 0.5 ug TIW, Venofer 50 mg q Thursday  pth 1000, P 5.6  tsat 16%  Hb 12     Assessment: 1 NSTEMI / CAD- s/p CABG today 2 ESRD on HD 3 Volume - is only up 2-3 kg post op , new dry wt 71.5kg 4 Anemia Hb 9.1, no esa at this time 5 HPTH on vit D, binders when eating, 2Ca bath 6 DM per primary   Plan- doing well, no dramatic vol excess post op, lytes are ok.  We reassess in am.  We should be able to go to Wed before doing next dialysis, as he was dialyzed both days over the weekend.      Kelly Splinter MD  pager [phone]    cell [phone]  08/19/2013, 5:04 PM     Recent Labs Lab 08/16/13 0220 08/17/13 0325 08/18/13 0248 08/19/13 0245  08/19/13 1013 08/19/13 1114 08/19/13 1227  NA 142 139 137 141  < > 134* 134* 136*  K 4.1 4.8 4.3 4.0  < > 3.8 4.3 3.9  CL 97 98 93* 97  < > 93* 97 100  CO2 30 22 28 30   --   --   --   --   GLUCOSE 95 105* 121* 71  < > 107* 134* 143*  BUN 21 38* 25* 17  < > 16 18 18   CREATININE 6.01* 8.35* 6.46* 5.36*  < > 5.80* 6.30* 5.90*  CALCIUM 9.6 10.1 10.2 9.9  --   --   --   --   PHOS 4.3 5.8* 5.4*  --   --   --   --   --   < > = values in this interval not displayed.  Recent Labs Lab 08/17/13 0325 08/18/13 0248 08/19/13 0245  AST  --   --   10  ALT  --   --  7  ALKPHOS  --   --  84  BILITOT  --   --  0.3  PROT  --   --  6.6  ALBUMIN 3.5 3.6 3.3*    Recent Labs Lab 08/18/13 0245 08/19/13 0245  08/19/13 1120 08/19/13 1125 08/19/13 1227 08/19/13 1300  WBC 8.5 7.7  --   --   --   --  11.1*  HGB 11.8* 10.6*  < > 8.6* 8.4* 8.8* 9.1*  HCT 36.6* 33.4*  < > 26.4* 25.8* 26.0* 27.3*  MCV 95.8 96.0  --   --   --   --  95.8  PLT 280  232  --  182 126*  --  134*  < > = values in this interval not displayed. Derrill Memo ON 08/20/2013] acetaminophen  1,000 mg Oral 4 times per day   Or  . [START ON 08/20/2013] acetaminophen (TYLENOL) oral liquid 160 mg/5 mL  1,000 mg Per Tube 4 times per day  . antiseptic oral rinse  7 mL Mouth Rinse QID  . [START ON 08/20/2013] aspirin EC  325 mg Oral Daily   Or  . [START ON 08/20/2013] aspirin  324 mg Per Tube Daily  . [START ON 08/20/2013] atorvastatin  20 mg Oral q morning - 10a  . [START ON 08/20/2013] bisacodyl  10 mg Oral Daily   Or  . [START ON 08/20/2013] bisacodyl  10 mg Rectal Daily  . cefUROXime (ZINACEF)  IV  1.5 g Intravenous Q12H  . chlorhexidine  15 mL Mouth Rinse BID  . [START ON 08/20/2013] docusate sodium  200 mg Oral Daily  . famotidine (PEPCID) IV  20 mg Intravenous Q24 Hr x 2  . insulin regular  0-10 Units Intravenous TID WC  . levalbuterol  0.63 mg Nebulization Q6H  . magnesium sulfate  4 g Intravenous Once  . metoprolol tartrate  12.5 mg Oral BID   Or  . metoprolol tartrate  12.5 mg Per Tube BID  . [START ON 08/21/2013] pantoprazole  40 mg Oral Daily  . potassium chloride  10 mEq Intravenous Q1 Hr x 3  . [START ON 08/20/2013] sodium chloride  3 mL Intravenous Q12H  . vancomycin  1,000 mg Intravenous Once   . sodium chloride 10 mL/hr at 08/19/13 1345  . sodium chloride 10 mL/hr at 08/19/13 1315  . [START ON 08/20/2013] sodium chloride    . dexmedetomidine 0.2 mcg/kg/hr (08/19/13 1630)  . DOPamine Stopped (08/19/13 1400)  . insulin (NOVOLIN-R) infusion 0.5 Units/hr (08/19/13  1600)  . lactated ringers    . milrinone 0.1 mcg/kg/min (08/19/13 1500)  . nitroGLYCERIN Stopped (08/19/13 1630)  . phenylephrine (NEO-SYNEPHRINE) Adult infusion Stopped (08/19/13 1600)   albumin human, lactated ringers, metoprolol, midazolam, morphine injection, morphine injection, ondansetron (ZOFRAN) IV, oxyCODONE, [START ON 08/20/2013] sodium chloride

## 2013-08-19 NOTE — Anesthesia Preprocedure Evaluation (Addendum)
Anesthesia Evaluation  Patient identified by MRN, date of birth, ID band Patient awake    Reviewed: Allergy & Precautions, H&P , NPO status , Patient's Chart, lab work & pertinent test results, reviewed documented beta blocker date and time   Airway Mallampati: II  Neck ROM: Full    Dental  (+) Dental Advisory Given, Edentulous Upper, Edentulous Lower   Pulmonary shortness of breath, Current Smoker,  breath sounds clear to auscultation        Cardiovascular hypertension, Pt. on medications + Past MI and +CHF Rhythm:Regular Rate:Normal     Neuro/Psych    GI/Hepatic GERD-  Medicated and Controlled,  Endo/Other  diabetes  Renal/GU ESRF and DialysisRenal disease     Musculoskeletal   Abdominal   Peds  Hematology   Anesthesia Other Findings   Reproductive/Obstetrics                         Anesthesia Physical Anesthesia Plan  ASA: IV  Anesthesia Plan: General   Post-op Pain Management:    Induction: Intravenous  Airway Management Planned: Oral ETT  Additional Equipment: Arterial line, PA Cath, TEE and Ultrasound Guidance Line Placement  Intra-op Plan:   Post-operative Plan: Post-operative intubation/ventilation  Informed Consent: I have reviewed the patients History and Physical, chart, labs and discussed the procedure including the risks, benefits and alternatives for the proposed anesthesia with the patient or authorized representative who has indicated his/her understanding and acceptance.   Dental advisory given  Plan Discussed with: CRNA, Anesthesiologist and Surgeon  Anesthesia Plan Comments:       Anesthesia Quick Evaluation

## 2013-08-19 NOTE — Progress Notes (Signed)
UR Completed.  Vergie Living [id number] 08/19/2013

## 2013-08-19 NOTE — Brief Op Note (Signed)
      [address]       [place] [address]             [phone]     08/13/2013 - 08/19/2013  11:30 AM  PATIENT:  [name]  68 y.o. male  PRE-OPERATIVE DIAGNOSIS:  CAD  POST-OPERATIVE DIAGNOSIS: CAD  PROCEDURE:  Procedure(s): CORONARY ARTERY BYPASS GRAFTING (CABG)x5 LIMA-LAD, SVG-DIAG; SEQSVG-INTERM-DIST CX; SVG-RCA INTRAOPERATIVE TRANSESOPHAGEAL ECHOCARDIOGRAM EVH LEFT LEG(RIGHT EXPLORED AT [facility] AND NOT HARVESTED)  SURGEON:  Surgeon(s): [name], MD  PHYSICIAN ASSISTANT: Andrena Margerum PA-C  ANESTHESIA:   general  PATIENT CONDITION:  ICU - intubated and hemodynamically stable.  PRE-OPERATIVE WEIGHT: 26VZ  COMPLICATIONS: NO KNOWN

## 2013-08-20 ENCOUNTER — Inpatient Hospital Stay (HOSPITAL_COMMUNITY): Payer: Medicare Other

## 2013-08-20 LAB — GLUCOSE, CAPILLARY
GLUCOSE-CAPILLARY: 100 mg/dL — AB (ref 70–99)
GLUCOSE-CAPILLARY: 102 mg/dL — AB (ref 70–99)
GLUCOSE-CAPILLARY: 115 mg/dL — AB (ref 70–99)
GLUCOSE-CAPILLARY: 116 mg/dL — AB (ref 70–99)
GLUCOSE-CAPILLARY: 90 mg/dL (ref 70–99)
GLUCOSE-CAPILLARY: 94 mg/dL (ref 70–99)
GLUCOSE-CAPILLARY: 95 mg/dL (ref 70–99)
Glucose-Capillary: 109 mg/dL — ABNORMAL HIGH (ref 70–99)
Glucose-Capillary: 123 mg/dL — ABNORMAL HIGH (ref 70–99)
Glucose-Capillary: 127 mg/dL — ABNORMAL HIGH (ref 70–99)
Glucose-Capillary: 146 mg/dL — ABNORMAL HIGH (ref 70–99)
Glucose-Capillary: 83 mg/dL (ref 70–99)
Glucose-Capillary: 91 mg/dL (ref 70–99)
Glucose-Capillary: 101 mg/dL — ABNORMAL HIGH (ref 70–99)
Glucose-Capillary: 90 mg/dL (ref 70–99)
Glucose-Capillary: 101 mg/dL — ABNORMAL HIGH (ref 70–99)
Glucose-Capillary: 102 mg/dL — ABNORMAL HIGH (ref 70–99)
Glucose-Capillary: 90 mg/dL (ref 70–99)

## 2013-08-20 LAB — CREATININE, SERUM
Creatinine, Ser: 4.09 mg/dL — ABNORMAL HIGH (ref 0.50–1.35)
GFR calc Af Amer: 16 mL/min — ABNORMAL LOW (ref >90)
GFR, EST NON AFRICAN AMERICAN: 14 mL/min — AB (ref >90)

## 2013-08-20 LAB — CBC
HCT: 27 % — ABNORMAL LOW (ref 39.0–52.0)
HCT: 28.5 % — ABNORMAL LOW (ref 39.0–52.0)
HEMOGLOBIN: 8.8 g/dL — AB (ref 13.0–17.0)
Hemoglobin: 9.3 g/dL — ABNORMAL LOW (ref 13.0–17.0)
MCH: 31.3 pg (ref 26.0–34.0)
MCH: 30.9 pg (ref 26.0–34.0)
MCHC: 32.6 g/dL (ref 30.0–36.0)
MCHC: 32.6 g/dL (ref 30.0–36.0)
MCV: 96.1 fL (ref 78.0–100.0)
MCV: 94.7 fL (ref 78.0–100.0)
PLATELETS: 166 10*3/uL (ref 150–400)
Platelets: 188 10*3/uL (ref 150–400)
RBC: 2.81 MIL/uL — AB (ref 4.22–5.81)
RBC: 3.01 MIL/uL — AB (ref 4.22–5.81)
RDW: 14.9 % (ref 11.5–15.5)
RDW: 15 % (ref 11.5–15.5)
WBC: 13.2 10*3/uL — ABNORMAL HIGH (ref 4.0–10.5)
WBC: 16.4 10*3/uL — AB (ref 4.0–10.5)

## 2013-08-20 LAB — BASIC METABOLIC PANEL
Anion gap: 16 — ABNORMAL HIGH (ref 5–15)
BUN: 25 mg/dL — ABNORMAL HIGH (ref 6–23)
CO2: 20 meq/L (ref 19–32)
Calcium: 9.5 mg/dL (ref 8.4–10.5)
Chloride: 99 mEq/L (ref 96–112)
Creatinine, Ser: 6.92 mg/dL — ABNORMAL HIGH (ref 0.50–1.35)
GFR calc Af Amer: 9 mL/min — ABNORMAL LOW (ref >90)
GFR calc non Af Amer: 7 mL/min — ABNORMAL LOW (ref >90)
GLUCOSE: 94 mg/dL (ref 70–99)
Potassium: 5.1 mEq/L (ref 3.7–5.3)
SODIUM: 135 meq/L — AB (ref 137–147)

## 2013-08-20 LAB — POCT I-STAT, CHEM 8
BUN: 14 mg/dL (ref 6–23)
CHLORIDE: 97 meq/L (ref 96–112)
CREATININE: 4.4 mg/dL — AB (ref 0.50–1.35)
Calcium, Ion: 1.21 mmol/L (ref 1.13–1.30)
Glucose, Bld: 112 mg/dL — ABNORMAL HIGH (ref 70–99)
HCT: 31 % — ABNORMAL LOW (ref 39.0–52.0)
Hemoglobin: 10.5 g/dL — ABNORMAL LOW (ref 13.0–17.0)
Potassium: 3.5 mEq/L — ABNORMAL LOW (ref 3.7–5.3)
SODIUM: 136 meq/L — AB (ref 137–147)
TCO2: 24 mmol/L (ref 0–100)

## 2013-08-20 LAB — MAGNESIUM
MAGNESIUM: 2 mg/dL (ref 1.5–2.5)
MAGNESIUM: 1.9 mg/dL (ref 1.5–2.5)

## 2013-08-20 MED ORDER — CETYLPYRIDINIUM CHLORIDE 0.05 % MT LIQD
7.0000 mL | Freq: Two times a day (BID) | OROMUCOSAL | Status: DC
  Administered 2013-08-20 – 2013-09-05 (×25): 7 mL via OROMUCOSAL

## 2013-08-20 MED ORDER — INSULIN DETEMIR 100 UNIT/ML ~~LOC~~ SOLN
5.0000 [IU] | Freq: Once | SUBCUTANEOUS | Status: AC
  Administered 2013-08-20: 5 [IU] via SUBCUTANEOUS
  Filled 2013-08-20: qty 0.05

## 2013-08-20 MED ORDER — DEXTROSE 5 % IV SOLN
750.0000 mg | INTRAVENOUS | Status: AC
  Administered 2013-08-21: 750 mg via INTRAVENOUS
  Filled 2013-08-20: qty 750

## 2013-08-20 MED ORDER — INSULIN ASPART 100 UNIT/ML ~~LOC~~ SOLN
0.0000 [IU] | SUBCUTANEOUS | Status: DC
  Administered 2013-08-21: 2 [IU] via SUBCUTANEOUS

## 2013-08-20 MED FILL — Dexmedetomidine HCl IV Soln 200 MCG/2ML: INTRAVENOUS | Qty: 2 | Status: AC

## 2013-08-20 MED FILL — Potassium Chloride Inj 2 mEq/ML: INTRAVENOUS | Qty: 40 | Status: AC

## 2013-08-20 MED FILL — Heparin Sodium (Porcine) Inj 1000 Unit/ML: INTRAMUSCULAR | Qty: 30 | Status: AC

## 2013-08-20 MED FILL — Lidocaine HCl IV Inj 20 MG/ML: INTRAVENOUS | Qty: 5 | Status: AC

## 2013-08-20 MED FILL — Heparin Sodium (Porcine) Inj 1000 Unit/ML: INTRAMUSCULAR | Qty: 10 | Status: AC

## 2013-08-20 MED FILL — Magnesium Sulfate Inj 50%: INTRAMUSCULAR | Qty: 10 | Status: AC

## 2013-08-20 MED FILL — Mannitol IV Soln 20%: INTRAVENOUS | Qty: 500 | Status: AC

## 2013-08-20 MED FILL — Electrolyte-R (PH 7.4) Solution: INTRAVENOUS | Qty: 3000 | Status: AC

## 2013-08-20 MED FILL — Sodium Bicarbonate IV Soln 8.4%: INTRAVENOUS | Qty: 50 | Status: AC

## 2013-08-20 MED FILL — Sodium Chloride IV Soln 0.9%: INTRAVENOUS | Qty: 4000 | Status: AC

## 2013-08-20 NOTE — Progress Notes (Signed)
TCTS PM rounds  Stable day Tolerated HD with BP 130/ 70 Pm labs reviewed

## 2013-08-20 NOTE — Op Note (Signed)
NAME:  [name] [name], [name] [name] NO.:  [id number]  MEDICAL RECORD NO.:  [id number]  LOCATION:  2S02C                        FACILITY:  [facility]  PHYSICIAN:  Lanelle Bal, MD    DATE OF BIRTH:  [birth year]  DATE OF PROCEDURE:  08/19/2013 DATE OF DISCHARGE:                              OPERATIVE REPORT   PREOPERATIVE DIAGNOSIS:  Unstable angina.  POSTOPERATIVE DIAGNOSIS:  Unstable angina.  SURGICAL PROCEDURE:  Coronary artery bypass grafting x5 with the left internal mammary to the left anterior descending coronary artery, reverse saphenous vein graft to the diagonal coronary artery, sequential reverse saphenous vein graft to the intermediate coronary artery and distal circumflex, reverse saphenous vein graft to the distal right coronary artery with left thigh and calf endo vein harvesting.  SURGEON:  Lanelle Bal, MD  FIRST ASSISTANT:  John Giovanni, PA  BRIEF HISTORY:  The patient is a 68 year old male, who is on dialysis chronically presents with prolonged chest pain and is admitted.  Cardiac catheterization was performed by Dr. Fletcher Anon, which demonstrates significant three-vessel coronary artery disease.  Coronary artery bypass grafting was recommended to the patient who agrees and signed informed consent.  DESCRIPTION OF PROCEDURE:  With Swan-Ganz and arterial line monitors in place, the patient underwent general endotracheal anesthesia without incident.  Dr. Orene Desanctis placed a TEE probe with findings under separate dictation.  There was a trace atrial aortic insufficiency, trace mitral insufficiency, overall global hypokinesis with ejection fraction of 40%. Appropriate time-out was performed.  Skin of the chest and legs was prepped with Betadine and draped in usual sterile manner.  A small incision was made at the right knee.  The vein was identified in this area but was very small and branching.  Similar incision was made in the left knee.  The vein appeared  much more suitable for bypass.  Using the [employer] endo vein harvesting system, vein was harvested from the left thigh and calf and was of good quality and caliber.  Median sternotomy was performed.  Left internal mammary artery was dissected down as a pedicle graft.  The distal artery was divided and had good free flow. Pericardium was opened.  Overall ventricular function appeared globally depressed moderately and evidence of left ventricular hypertrophy.  The patient was systemically heparinized.  Ascending aorta was cannulated. The right atrium was cannulated and aortic root vent cardioplegia needle was introduced into the ascending aorta.  The patient was placed on cardiopulmonary bypass 2.4 L/min/m2.  Sites of anastomoses were inspected and were dissected out of the epicardium.  The patient's body temperature was cooled to 32 degrees.  Aortic crossclamp was applied and 600 mL of cold blood potassium cardioplegia was administered with diastolic arrest of the heart.  Myocardial septal temperature was monitored throughout the crossclamp.  Attention was turned first to the distal right coronary artery.  The vessel was opened admitted a 1.5 mm probe.  Using a running 7-0 Prolene, distal anastomosis was performed. The heart was then elevated and the intermediate coronary artery which was intramyocardial was identified and opened.  It easily admitted a 1.5- mm probe.  Using a diamond-type side-to-side anastomosis with a running 8-0 Prolene, a segment  of reverse saphenous vein graft was anastomosed to the intermediate coronary artery.  Distal extent of the same vein was then carried to the distal circumflex branches.  Just before these branches trifurcated, the vessel was opened and admitted a 1.5-mm probe. Using a running 7-0 Prolene, distal anastomosis was performed. Attention was then turned to the diagonal coronary artery which was opened, admitted a 1.5-mm probe.  Using a running 7-0  Prolene, a segment of reverse saphenous vein graft was anastomosed to the diagonal coronary artery.  Between the mid and distal third of the LAD, the vessel was opened, 1.5 mm probe passed distally.  Using a running 8-0 Prolene, the left internal mammary artery was anastomosed to the left anterior descending coronary artery.  With release of the bulldog on the mammary artery, there was appropriate rise in myocardial septal temperature. Bulldog was placed back on the mammary artery.  Cold blood cardioplegia was administered periodically down the vein grafts in addition.  With the cross-clamp still in place, three punch aortotomies were performed and each of the 3 vein grafts were anastomosed to the ascending aorta. Air was evacuated from the grafts and aortic cross-clamp was removed. The patient was spontaneously converted to a sinus rhythm.  Before removal of the cross-clamp, the bulldog on the mammary artery was removed and there was prompt rise in myocardial septal temperature. Sites of anastomoses were inspected and were free of bleeding.  The patient was then ventilated and weaned from cardiopulmonary bypass without difficulty.  TEE showed improved overall global function.  The patient was on very low-dose milrinone and dopamine infusion.  He remained hemodynamically stable off bypass.  He was decannulated in usual fashion.  Protamine sulfate was administered.  With the operative field hemostatic, atrial and ventricular pacing wires were applied. Graft marker was applied.  A left pleural tube and a Blake mediastinal drain were left in place.  Sternum was closed with #6 stainless steel wire.  Fascia closed with interrupted 0 Vicryl, running 3-0 Vicryl in subcutaneous tissue, 4-0 subcuticular stitch in skin edges.  Dry dressings were applied.  Sponge and needle count was reported as correct.  The patient tolerated the procedure without obvious complication and was transferred to  Surgical Intensive Care Unit for further postoperative care.     Lanelle Bal, MD     EG/MEDQ  D:  08/20/2013  T:  08/20/2013  Job:  [id number]  cc:   Kathlyn Sacramento, MD

## 2013-08-20 NOTE — Progress Notes (Signed)
[name] [name] Progress Note   Subjective: off vent, I/O + 3L yest and is 6 kg up today over dry wt, no SOB or hypoxemia  Filed Vitals:   08/20/13 0615 08/20/13 0630 08/20/13 0645 08/20/13 0700  BP: 108/60   105/62  Pulse: 108  109 110  Temp: 99.9 F (37.7 C)  99.7 F (37.6 C) 99.5 F (37.5 C)  TempSrc:      Resp: 34 26 27 32  Height:      Weight:      SpO2: 94%  97% 98%   Exam: Alert, extubated, periorbital edema No jvd Chest bibasilar rales, CT x 2 in place RRR 1/6 SEM no RG Abd soft, NTND Minimal edema UE's mostly Neuro is alert and Ox 3 Left arm AVF +bruit  CXR today 8/11 >> new pulm edema, mild-mod  HD: [facility]   F180   400/800   2/2.0 Bath   73kg  Heparin 4000   L arm AVF Calcitriol 0.5 ug TIW, Venofer 50 mg q Thursday  pth 1000, P 5.6  tsat 16%  Hb 12     Assessment: 1 NSTEMI / CAD- s/p CABG yesterday 2 ESRD on HD 3 Volume - cxr w new edema, up 5-6 kg today, on low dose of pressors and [place] O2 4 Anemia Hb 9.1, no esa at this time 5 HPTH on vit D, binders when eating, 2Ca bath 6 DM per primary   Plan- plan short HD today to get volume down gently, and HD again tomorrow a little longer. Minimize fluids , have d/w primary MD    Kelly Splinter MD  pager [phone]    cell [phone]  08/20/2013, 7:49 AM     Recent Labs Lab 08/16/13 0220 08/17/13 0325 08/18/13 0248 08/19/13 0245  08/19/13 1227 08/19/13 1328 08/19/13 1954 08/19/13 2000 08/20/13 0400  NA 142 139 137 141  < > 136* 137 134*  --  135*  K 4.1 4.8 4.3 4.0  < > 3.9 3.7 5.3  --  5.1  CL 97 98 93* 97  < > 100  --  102  --  99  CO2 30 22 28 30   --   --   --   --   --  20  GLUCOSE 95 105* 121* 71  < > 143* 129* 120*  --  94  BUN 21 38* 25* 17  < > 18  --  21  --  25*  CREATININE 6.01* 8.35* 6.46* 5.36*  < > 5.90*  --  6.40* 5.95* 6.92*  CALCIUM 9.6 10.1 10.2 9.9  --   --   --   --   --  9.5  PHOS 4.3 5.8* 5.4*  --   --   --   --   --   --   --   < > = values in this  interval not displayed.  Recent Labs Lab 08/17/13 0325 08/18/13 0248 08/19/13 0245  AST  --   --  10  ALT  --   --  7  ALKPHOS  --   --  84  BILITOT  --   --  0.3  PROT  --   --  6.6  ALBUMIN 3.5 3.6 3.3*    Recent Labs Lab 08/19/13 1300  08/19/13 1954 08/19/13 2000 08/20/13 0400  WBC 11.1*  --   --  15.6* 13.2*  HGB 9.1*  < > 10.2* 9.5* 8.8*  HCT 27.3*  < >  30.0* 28.9* 27.0*  MCV 95.8  --   --  95.7 96.1  PLT 134*  --   --  202 188  < > = values in this interval not displayed. Marland Kitchen acetaminophen  1,000 mg Oral 4 times per day   Or  . acetaminophen (TYLENOL) oral liquid 160 mg/5 mL  1,000 mg Per Tube 4 times per day  . antiseptic oral rinse  7 mL Mouth Rinse QID  . aspirin EC  325 mg Oral Daily   Or  . aspirin  324 mg Per Tube Daily  . atorvastatin  20 mg Oral q morning - 10a  . bisacodyl  10 mg Oral Daily   Or  . bisacodyl  10 mg Rectal Daily  . cefUROXime (ZINACEF)  IV  1.5 g Intravenous Q12H  . chlorhexidine  15 mL Mouth Rinse BID  . docusate sodium  200 mg Oral Daily  . famotidine (PEPCID) IV  20 mg Intravenous Q24 Hr x 2  . insulin aspart  0-24 Units Subcutaneous 6 times per day  . insulin detemir  5 Units Subcutaneous Once  . insulin regular  0-10 Units Intravenous TID WC  . levalbuterol  0.63 mg Nebulization Q6H  . magnesium sulfate  4 g Intravenous Once  . metoprolol tartrate  12.5 mg Oral BID   Or  . metoprolol tartrate  12.5 mg Per Tube BID  . [START ON 08/21/2013] pantoprazole  40 mg Oral Daily  . sodium chloride  3 mL Intravenous Q12H   . sodium chloride 10 mL/hr at 08/19/13 1345  . sodium chloride 10 mL/hr at 08/19/13 1315  . sodium chloride    . dexmedetomidine Stopped (08/19/13 1700)  . insulin (NOVOLIN-R) infusion 1.1 Units/hr (08/20/13 0600)  . lactated ringers    . nitroGLYCERIN Stopped (08/19/13 1630)  . phenylephrine (NEO-SYNEPHRINE) Adult infusion 50 mcg/min (08/20/13 0128)   albumin human, metoprolol, midazolam, morphine injection,  ondansetron (ZOFRAN) IV, oxyCODONE, sodium chloride

## 2013-08-20 NOTE — Anesthesia Postprocedure Evaluation (Signed)
  Anesthesia Post-op Note  Patient: [name] [name]  Procedure(s) Performed: Procedure(s): CORONARY ARTERY BYPASS GRAFTING (CABG) times 5 using left internal mammary artery to LAD, saphenous vein graft to diagonal; sequential saphenous vein graft to intermediate and distal circumflex; and saphenous vein graft to right coronary artery  (N/A) INTRAOPERATIVE TRANSESOPHAGEAL ECHOCARDIOGRAM (N/A) ENDOVEIN HARVEST OF GREATER SAPHENOUS VEIN from left thigh and calf (Left)  Patient Location: SICU  Anesthesia Type:General  Level of Consciousness: awake and alert   Airway and Oxygen Therapy: Patient Spontanous Breathing and Patient connected to nasal cannula oxygen  Post-op Pain: mild  Post-op Assessment: Post-op Vital signs reviewed, Patient's Cardiovascular Status Stable and Respiratory Function Stable  Post-op Vital Signs: Reviewed and stable  Last Vitals:  Filed Vitals:   08/20/13 1400  BP:   Pulse: 107  Temp:   Resp: 28    Complications: No apparent anesthesia complications

## 2013-08-20 NOTE — Progress Notes (Signed)
Patient ID: [name] [name], male   DOB: 02-19-1945, 68 y.o.   MRN: [id number] TCTS DAILY ICU PROGRESS NOTE                   [address]            [place] [address]          [phone]   1 Day Post-Op Procedure(s) (LRB): CORONARY ARTERY BYPASS GRAFTING (CABG) times 5 using left internal mammary artery to LAD, saphenous vein graft to diagonal; sequential saphenous vein graft to intermediate and distal circumflex; and saphenous vein graft to right coronary artery  (N/A) INTRAOPERATIVE TRANSESOPHAGEAL ECHOCARDIOGRAM (N/A) ENDOVEIN HARVEST OF GREATER SAPHENOUS VEIN from left thigh and calf (Left)  Total Length of Stay:  LOS: 7 days   Subjective: Awake and alert, neuro intact  Objective: Vital signs in last 24 hours: Temp:  [97.2 F (36.2 C)-100.2 F (37.9 C)] 99.5 F (37.5 C) (08/11 0700) Pulse Rate:  [33-110] 110 (08/11 0700) Cardiac Rhythm:  [-] Sinus tachycardia (08/11 0600) Resp:  [9-36] 32 (08/11 0700) BP: (77-150)/(51-69) 105/62 mmHg (08/11 0700) SpO2:  [90 %-100 %] 98 % (08/11 0700) Arterial Line BP: (81-150)/(36-63) 116/47 mmHg (08/11 0700) FiO2 (%):  [3 %-50 %] 3 % (08/11 0200) Weight:  [163 lb 2.3 oz (74 kg)-174 lb 2.6 oz (79 kg)] 174 lb 2.6 oz (79 kg) (08/11 0500)  Filed Weights   08/18/13 1401 08/19/13 1330 08/20/13 0500  Weight: 163 lb 12.8 oz (74.3 kg) 163 lb 2.3 oz (74 kg) 174 lb 2.6 oz (79 kg)    Weight change: -14.1 oz (-0.4 kg)   Hemodynamic parameters for last 24 hours: PAP: (24-59)/(15-27) 40/18 mmHg CO:  [4.5 L/min-7.6 L/min] 6.6 L/min CI:  [2.5 L/min/m2-4.2 L/min/m2] 3.6 L/min/m2  Intake/Output from previous day: 08/10 0701 - 08/11 0700 In: 3962.7 [I.V.:3162.7; Blood:250; IV Piggyback:550] Out: 555 [Urine:285; Chest Tube:270]  Intake/Output this shift:    Current Meds: Scheduled Meds: . acetaminophen  1,000 mg Oral 4 times per day   Or  . acetaminophen (TYLENOL) oral liquid 160 mg/5 mL  1,000 mg Per Tube 4 times per day  .  antiseptic oral rinse  7 mL Mouth Rinse QID  . aspirin EC  325 mg Oral Daily   Or  . aspirin  324 mg Per Tube Daily  . atorvastatin  20 mg Oral q morning - 10a  . bisacodyl  10 mg Oral Daily   Or  . bisacodyl  10 mg Rectal Daily  . cefUROXime (ZINACEF)  IV  1.5 g Intravenous Q12H  . chlorhexidine  15 mL Mouth Rinse BID  . docusate sodium  200 mg Oral Daily  . famotidine (PEPCID) IV  20 mg Intravenous Q24 Hr x 2  . insulin regular  0-10 Units Intravenous TID WC  . levalbuterol  0.63 mg Nebulization Q6H  . magnesium sulfate  4 g Intravenous Once  . metoprolol tartrate  12.5 mg Oral BID   Or  . metoprolol tartrate  12.5 mg Per Tube BID  . [START ON 08/21/2013] pantoprazole  40 mg Oral Daily  . sodium chloride  3 mL Intravenous Q12H   Continuous Infusions: . sodium chloride 10 mL/hr at 08/19/13 1345  . sodium chloride 10 mL/hr at 08/19/13 1315  . sodium chloride    . dexmedetomidine Stopped (08/19/13 1700)  . DOPamine Stopped (08/19/13 1400)  . insulin (NOVOLIN-R) infusion 1.1 Units/hr (08/20/13 0600)  . lactated ringers    .  milrinone 0.1 mcg/kg/min (08/19/13 1900)  . nitroGLYCERIN Stopped (08/19/13 1630)  . phenylephrine (NEO-SYNEPHRINE) Adult infusion 50 mcg/min (08/20/13 0128)   PRN Meds:.albumin human, metoprolol, midazolam, morphine injection, ondansetron (ZOFRAN) IV, oxyCODONE, sodium chloride  General appearance: alert, cooperative and no distress Neurologic: intact Heart: regular rate and rhythm, S1, S2 normal, no murmur, click, rub or gallop Lungs: diminished breath sounds bibasilar Abdomen: soft, non-tender; bowel sounds normal; no masses,  no organomegaly Extremities: extremities normal, atraumatic, no cyanosis or edema and Homans sign is negative, no sign of DVT Wound: dressing intact  Lab Results: CBC: Recent Labs  08/19/13 2000 08/20/13 0400  WBC 15.6* 13.2*  HGB 9.5* 8.8*  HCT 28.9* 27.0*  PLT 202 188   BMET:  Recent Labs  08/19/13 0245   08/19/13 1954 08/19/13 2000 08/20/13 0400  NA 141  < > 134*  --  135*  K 4.0  < > 5.3  --  5.1  CL 97  < > 102  --  99  CO2 30  --   --   --  20  GLUCOSE 71  < > 120*  --  94  BUN 17  < > 21  --  25*  CREATININE 5.36*  < > 6.40* 5.95* 6.92*  CALCIUM 9.9  --   --   --  9.5  < > = values in this interval not displayed.  PT/INR:  Recent Labs  08/19/13 1300  LABPROT 16.0*  INR 1.28   Radiology: Dg Chest Portable 1 View  08/19/2013   CLINICAL DATA:  Coronary artery disease ; status post coronary artery bypass grafting  EXAM: PORTABLE CHEST - 1 VIEW  COMPARISON:  August 13, 2013  FINDINGS: Endotracheal tube tip is 4.8 cm above the carina. There is a chest tube on the left. There is an mediastinal drain. Swan-Ganz catheter tip is in the proximal right main pulmonary artery. Nasogastric tube tip and side port are below the diaphragm. Temporary pacemaker leads are attached to the right heart. No pneumothorax. There is mild bibasilar atelectatic change. Lungs are otherwise clear. Heart is upper normal in size with pulmonary vascularity within normal limits. No adenopathy. No bone lesions.  IMPRESSION: Tube and catheter positions as described without pneumothorax. Mild bibasilar atelectatic change. Elsewhere lungs are clear.   Electronically Signed   By: Lowella Grip M.D.   On: 08/19/2013 13:50     Assessment/Plan: S/P Procedure(s) (LRB): CORONARY ARTERY BYPASS GRAFTING (CABG) times 5 using left internal mammary artery to LAD, saphenous vein graft to diagonal; sequential saphenous vein graft to intermediate and distal circumflex; and saphenous vein graft to right coronary artery  (N/A) INTRAOPERATIVE TRANSESOPHAGEAL ECHOCARDIOGRAM (N/A) ENDOVEIN HARVEST OF GREATER SAPHENOUS VEIN from left thigh and calf (Left) Mobilize Diabetes control d/c tubes/lines See progression orders Prep anemia and Expected Acute  Blood - loss Anemia   Oaklie Durrett B 08/20/2013 7:41 AM

## 2013-08-21 ENCOUNTER — Inpatient Hospital Stay (HOSPITAL_COMMUNITY): Payer: Medicare Other

## 2013-08-21 LAB — GLUCOSE, CAPILLARY
GLUCOSE-CAPILLARY: 103 mg/dL — AB (ref 70–99)
GLUCOSE-CAPILLARY: 111 mg/dL — AB (ref 70–99)
GLUCOSE-CAPILLARY: 119 mg/dL — AB (ref 70–99)
GLUCOSE-CAPILLARY: 89 mg/dL (ref 70–99)
Glucose-Capillary: 111 mg/dL — ABNORMAL HIGH (ref 70–99)
Glucose-Capillary: 118 mg/dL — ABNORMAL HIGH (ref 70–99)

## 2013-08-21 LAB — BASIC METABOLIC PANEL
Anion gap: 15 (ref 5–15)
BUN: 19 mg/dL (ref 6–23)
CO2: 25 mEq/L (ref 19–32)
Calcium: 9.3 mg/dL (ref 8.4–10.5)
Chloride: 94 mEq/L — ABNORMAL LOW (ref 96–112)
Creatinine, Ser: 5.63 mg/dL — ABNORMAL HIGH (ref 0.50–1.35)
GFR calc Af Amer: 11 mL/min — ABNORMAL LOW (ref >90)
GFR calc non Af Amer: 9 mL/min — ABNORMAL LOW (ref >90)
Glucose, Bld: 104 mg/dL — ABNORMAL HIGH (ref 70–99)
Potassium: 4.4 mEq/L (ref 3.7–5.3)
Sodium: 134 mEq/L — ABNORMAL LOW (ref 137–147)

## 2013-08-21 LAB — CBC
HCT: 26.7 % — ABNORMAL LOW (ref 39.0–52.0)
Hemoglobin: 8.8 g/dL — ABNORMAL LOW (ref 13.0–17.0)
MCH: 31.3 pg (ref 26.0–34.0)
MCHC: 33 g/dL (ref 30.0–36.0)
MCV: 95 fL (ref 78.0–100.0)
Platelets: 171 10*3/uL (ref 150–400)
RBC: 2.81 MIL/uL — ABNORMAL LOW (ref 4.22–5.81)
RDW: 15 % (ref 11.5–15.5)
WBC: 14.5 10*3/uL — ABNORMAL HIGH (ref 4.0–10.5)

## 2013-08-21 MED ORDER — AMIODARONE LOAD VIA INFUSION
150.0000 mg | Freq: Once | INTRAVENOUS | Status: DC
  Filled 2013-08-21: qty 83.34

## 2013-08-21 MED ORDER — LIDOCAINE HCL (PF) 1 % IJ SOLN: 5.0000 mL | INTRAMUSCULAR | Status: DC | PRN

## 2013-08-21 MED ORDER — ALTEPLASE 2 MG IJ SOLR
2.0000 mg | Freq: Once | INTRAMUSCULAR | Status: DC | PRN
Start: 2013-08-21 — End: 2013-08-21

## 2013-08-21 MED ORDER — PENTAFLUOROPROP-TETRAFLUOROETH EX AERO: 1.0000 "application " | INHALATION_SPRAY | CUTANEOUS | Status: DC | PRN

## 2013-08-21 MED ORDER — ALTEPLASE 2 MG IJ SOLR
2.0000 mg | Freq: Once | INTRAMUSCULAR | Status: AC | PRN
Start: 2013-08-21 — End: 2013-08-21

## 2013-08-21 MED ORDER — AMIODARONE HCL 200 MG PO TABS
200.0000 mg | ORAL_TABLET | Freq: Every day | ORAL | Status: DC
  Administered 2013-08-29 – 2013-09-06 (×9): 200 mg via ORAL
  Filled 2013-08-21 (×9): qty 1

## 2013-08-21 MED ORDER — LIDOCAINE-PRILOCAINE 2.5-2.5 % EX CREA: 1.0000 "application " | TOPICAL_CREAM | CUTANEOUS | Status: DC | PRN

## 2013-08-21 MED ORDER — NEPRO/CARBSTEADY PO LIQD: 237.0000 mL | ORAL | Status: DC | PRN

## 2013-08-21 MED ORDER — SODIUM CHLORIDE 0.9 % IV SOLN: 100.0000 mL | INTRAVENOUS | Status: DC | PRN

## 2013-08-21 MED ORDER — HEPARIN SODIUM (PORCINE) 1000 UNIT/ML DIALYSIS: 1000.0000 [IU] | INTRAMUSCULAR | Status: DC | PRN

## 2013-08-21 MED ORDER — ENOXAPARIN SODIUM 30 MG/0.3ML ~~LOC~~ SOLN
30.0000 mg | SUBCUTANEOUS | Status: DC
  Administered 2013-08-21 – 2013-08-23 (×3): 30 mg via SUBCUTANEOUS
  Filled 2013-08-21 (×5): qty 0.3

## 2013-08-21 MED ORDER — AMIODARONE HCL IN DEXTROSE 360-4.14 MG/200ML-% IV SOLN
30.0000 mg/h | INTRAVENOUS | Status: AC
  Filled 2013-08-21: qty 200

## 2013-08-21 MED ORDER — AMIODARONE HCL IN DEXTROSE 360-4.14 MG/200ML-% IV SOLN
INTRAVENOUS | Status: AC
  Administered 2013-08-21: 200 mL
  Filled 2013-08-21: qty 200

## 2013-08-21 MED ORDER — AMIODARONE HCL 200 MG PO TABS
200.0000 mg | ORAL_TABLET | Freq: Two times a day (BID) | ORAL | Status: AC
  Administered 2013-08-21 – 2013-08-28 (×14): 200 mg via ORAL
  Filled 2013-08-21 (×14): qty 1

## 2013-08-21 MED ORDER — LIDOCAINE-PRILOCAINE 2.5-2.5 % EX CREA
1.0000 | TOPICAL_CREAM | CUTANEOUS | Status: DC | PRN
Start: 2013-08-21 — End: 2013-08-22
  Filled 2013-08-21: qty 5

## 2013-08-21 MED ORDER — AMIODARONE HCL IN DEXTROSE 360-4.14 MG/200ML-% IV SOLN
60.0000 mg/h | INTRAVENOUS | Status: AC
  Administered 2013-08-21 (×2): 60 mg/h via INTRAVENOUS
  Filled 2013-08-21: qty 200

## 2013-08-21 NOTE — Progress Notes (Signed)
Patient ID: [name] [name], male   DOB: May 26, 1945, 68 y.o.   MRN: [id number] TCTS DAILY ICU PROGRESS NOTE                   [address]            [place] [address]          [phone]   2 Days Post-Op Procedure(s) (LRB): CORONARY ARTERY BYPASS GRAFTING (CABG) times 5 using left internal mammary artery to LAD, saphenous vein graft to diagonal; sequential saphenous vein graft to intermediate and distal circumflex; and saphenous vein graft to right coronary artery  (N/A) INTRAOPERATIVE TRANSESOPHAGEAL ECHOCARDIOGRAM (N/A) ENDOVEIN HARVEST OF GREATER SAPHENOUS VEIN from left thigh and calf (Left)  Total Length of Stay:  LOS: 8 days   Subjective: Up to chair, sleepy but neuro intact  Objective: Vital signs in last 24 hours: Temp:  [97.8 F (36.6 C)-99.5 F (37.5 C)] 99.5 F (37.5 C) (08/12 0400) Pulse Rate:  [70-112] 112 (08/12 0600) Cardiac Rhythm:  [-] Sinus tachycardia (08/11 2000) Resp:  [4-32] 28 (08/12 0600) BP: (89-171)/(41-147) 105/75 mmHg (08/12 0600) SpO2:  [89 %-100 %] 95 % (08/12 0600) Arterial Line BP: (98-138)/(42-70) 138/59 mmHg (08/12 0600) Weight:  [172 lb 2.9 oz (78.1 kg)] 172 lb 2.9 oz (78.1 kg) (08/12 0600)  Filed Weights   08/19/13 1330 08/20/13 0500 08/21/13 0600  Weight: 163 lb 2.3 oz (74 kg) 174 lb 2.6 oz (79 kg) 172 lb 2.9 oz (78.1 kg)    Weight change: 9 lb 0.6 oz (4.1 kg)   Hemodynamic parameters for last 24 hours: PAP: (37-52)/(14-26) 49/22 mmHg CO:  [6.4 L/min-7.2 L/min] 7.2 L/min CI:  [3.5 L/min/m2-4 L/min/m2] 4 L/min/m2  Intake/Output from previous day: 08/11 0701 - 08/12 0700 In: 1435.4 [P.O.:890; I.V.:445.4; IV Piggyback:100] Out: 2347 [Urine:115; Chest Tube:80]  Intake/Output this shift: Total I/O In: -  Out: 30 [Urine:30]  Current Meds: Scheduled Meds: . acetaminophen  1,000 mg Oral 4 times per day   Or  . acetaminophen (TYLENOL) oral liquid 160 mg/5 mL  1,000 mg Per Tube 4 times per day  . antiseptic oral  rinse  7 mL Mouth Rinse BID  . aspirin EC  325 mg Oral Daily   Or  . aspirin  324 mg Per Tube Daily  . atorvastatin  20 mg Oral q morning - 10a  . bisacodyl  10 mg Oral Daily   Or  . bisacodyl  10 mg Rectal Daily  . docusate sodium  200 mg Oral Daily  . insulin aspart  0-24 Units Subcutaneous 6 times per day  . insulin regular  0-10 Units Intravenous TID WC  . levalbuterol  0.63 mg Nebulization Q6H  . magnesium sulfate  4 g Intravenous Once  . metoprolol tartrate  12.5 mg Oral BID   Or  . metoprolol tartrate  12.5 mg Per Tube BID  . pantoprazole  40 mg Oral Daily  . sodium chloride  3 mL Intravenous Q12H   Continuous Infusions: . sodium chloride 10 mL/hr at 08/21/13 0500  . sodium chloride    . dexmedetomidine Stopped (08/19/13 1700)  . insulin (NOVOLIN-R) infusion Stopped (08/20/13 1000)  . lactated ringers    . nitroGLYCERIN Stopped (08/19/13 1630)  . phenylephrine (NEO-SYNEPHRINE) Adult infusion 30 mcg/min (08/21/13 0500)   PRN Meds:.metoprolol, morphine injection, ondansetron (ZOFRAN) IV, oxyCODONE, sodium chloride  General appearance: alert, cooperative and no distress Neurologic: intact Heart: regular rate and rhythm, S1, S2 normal,  no murmur, click, rub or gallop Lungs: diminished breath sounds bibasilar Abdomen: soft, non-tender; bowel sounds normal; no masses,  no organomegaly Extremities: extremities normal, atraumatic, no cyanosis or edema and Homans sign is negative, no sign of DVT Wound: dressing intact  Lab Results: CBC: Recent Labs  08/20/13 1600 08/20/13 1640 08/21/13 0155  WBC 16.4*  --  14.5*  HGB 9.3* 10.5* 8.8*  HCT 28.5* 31.0* 26.7*  PLT 166  --  171   BMET:  Recent Labs  08/20/13 0400  08/20/13 1640 08/21/13 0155  NA 135*  --  136* 134*  K 5.1  --  3.5* 4.4  CL 99  --  97 94*  CO2 20  --   --  25  GLUCOSE 94  --  112* 104*  BUN 25*  --  14 19  CREATININE 6.92*  < > 4.40* 5.63*  CALCIUM 9.5  --   --  9.3  < > = values in this  interval not displayed.  PT/INR:  Recent Labs  08/19/13 1300  LABPROT 16.0*  INR 1.28   Radiology: Dg Chest Portable 1 View In Am  08/20/2013   CLINICAL DATA:  Postop CABG.  EXAM: PORTABLE CHEST - 1 VIEW  COMPARISON:  None.  FINDINGS: Left-sided chest tube and mediastinal drain are unchanged. There has been interval removal of the endotracheal tube and nasogastric tube. Right IJ Swan-Ganz catheter has tip over the right main pulmonary artery. Lungs are hypoinflated with left basilar opacification likely effusions/ atelectasis slightly worse. There is new mild hazy bilateral perihilar opacification likely vascular congestion. Mild stable cardiomegaly. Remainder of the exam is unchanged.  IMPRESSION: New mild bilateral perihilar opacification suggesting minimal interstitial edema. Slight worsening left base opacification likely atelectasis/effusion, although cannot exclude infection.  Tubes and lines as described.   Electronically Signed   By: Marin Olp M.D.   On: 08/20/2013 08:29   Dg Chest Portable 1 View  08/19/2013   CLINICAL DATA:  Coronary artery disease ; status post coronary artery bypass grafting  EXAM: PORTABLE CHEST - 1 VIEW  COMPARISON:  August 13, 2013  FINDINGS: Endotracheal tube tip is 4.8 cm above the carina. There is a chest tube on the left. There is an mediastinal drain. Swan-Ganz catheter tip is in the proximal right main pulmonary artery. Nasogastric tube tip and side port are below the diaphragm. Temporary pacemaker leads are attached to the right heart. No pneumothorax. There is mild bibasilar atelectatic change. Lungs are otherwise clear. Heart is upper normal in size with pulmonary vascularity within normal limits. No adenopathy. No bone lesions.  IMPRESSION: Tube and catheter positions as described without pneumothorax. Mild bibasilar atelectatic change. Elsewhere lungs are clear.   Electronically Signed   By: Lowella Grip M.D.   On: 08/19/2013 13:50      Assessment/Plan: S/P Procedure(s) (LRB): CORONARY ARTERY BYPASS GRAFTING (CABG) times 5 using left internal mammary artery to LAD, saphenous vein graft to diagonal; sequential saphenous vein graft to intermediate and distal circumflex; and saphenous vein graft to right coronary artery  (N/A) INTRAOPERATIVE TRANSESOPHAGEAL ECHOCARDIOGRAM (N/A) ENDOVEIN HARVEST OF GREATER SAPHENOUS VEIN from left thigh and calf (Left) Mobilize Diabetes control d/c tubes/lines See progression orders     Denishia Citro B 08/21/2013 6:51 AM

## 2013-08-21 NOTE — Progress Notes (Signed)
Patient ID: [name] [name], male   DOB: 10/10/1945, 68 y.o.   MRN: [id number] EVENING ROUNDS NOTE :     [address]       [place] [address]             [phone]                 2 Days Post-Op Procedure(s) (LRB): CORONARY ARTERY BYPASS GRAFTING (CABG) times 5 using left internal mammary artery to LAD, saphenous vein graft to diagonal; sequential saphenous vein graft to intermediate and distal circumflex; and saphenous vein graft to right coronary artery  (N/A) INTRAOPERATIVE TRANSESOPHAGEAL ECHOCARDIOGRAM (N/A) ENDOVEIN HARVEST OF GREATER SAPHENOUS VEIN from left thigh and calf (Left)  Total Length of Stay:  LOS: 8 days  BP 112/53  Pulse 88  Temp(Src) 97.6 F (36.4 C) (Oral)  Resp 10  Ht 5' 4.96" (1.65 m)  Wt 170 lb 6.7 oz (77.3 kg)  BMI 28.39 kg/m2  SpO2 99%  .Intake/Output     08/12 0701 - 08/13 0700   P.O. 180   I.V. (mL/kg) 575.6 (7.4)   IV Piggyback    Total Intake(mL/kg) 755.6 (9.8)   Urine (mL/kg/hr) 15 (0)   Other 2700 (2.8)   Chest Tube 70 (0.1)   Total Output 2785   Net -2029.4         . sodium chloride 10 mL/hr at 08/21/13 0500  . sodium chloride    . amiodarone 30 mg/hr (08/21/13 1500)  . dexmedetomidine Stopped (08/19/13 1700)  . lactated ringers    . nitroGLYCERIN Stopped (08/19/13 1630)  . phenylephrine (NEO-SYNEPHRINE) Adult infusion Stopped (08/21/13 1500)     Lab Results  Component Value Date   WBC 14.5* 08/21/2013   HGB 8.8* 08/21/2013   HCT 26.7* 08/21/2013   PLT 171 08/21/2013   GLUCOSE 104* 08/21/2013   CHOL 87 04/22/2013   TRIG 76 04/22/2013   HDL 31* 04/22/2013   LDLCALC 41 04/22/2013   ALT 7 08/19/2013   AST 10 08/19/2013   NA 134* 08/21/2013   K 4.4 08/21/2013   CL 94* 08/21/2013   CREATININE 5.63* 08/21/2013   BUN 19 08/21/2013   CO2 25 08/21/2013   TSH 2.760 04/21/2013   PSA 4.52* 01/29/2010   INR 1.28 08/19/2013   HGBA1C 5.9* 08/19/2013   MICROALBUR 97.30* 10/08/2007   Holding sinus now, convert to po cordarone Walked  in unit today  [name] MD  Beeper [phone] Office [phone] 08/21/2013 7:35 PM

## 2013-08-21 NOTE — Progress Notes (Signed)
[facility] Progress Note   Subjective: was in afib this am, started on IV amio gtt and is in sinus rhythm now. No SOB or other complaints  Filed Vitals:   08/21/13 0500 08/21/13 0600 08/21/13 0700 08/21/13 0801  BP: 99/61 105/75 100/61   Pulse: 107 112 106   Temp:    97.7 F (36.5 C)  TempSrc:    Oral  Resp: 19 28 22    Height:      Weight:  78.1 kg (172 lb 2.9 oz)    SpO2: 96% 95% 93%    Exam: Alert, no distress, tired No jvd Chest faint bibasilar rales RRR 1/6 SEM no RG Abd soft, NTND Mild edema LE's Neuro is alert and Ox 3 Left arm AVF +bruit  CXR 8/12 no change mild edema  HD: [facility] 4h   F180   400/800   2/2.0 Bath   73kg  Heparin 4000   L arm AVF Calcitriol 0.5 ug TIW, Venofer 50 mg q Thursday  pth 1000, P 5.6  tsat 16%  Hb 12     Assessment: 1 NSTEMI / CAD- s/p CABG 8/11 2 ESRD on HD 3 Volume - a little better today, still up 4 Anemia Hb 8.8 will start aranesp 25 ug/wk 5 HPTH on vit D, binders when eating, 2Ca bath 6 DM per primary   Plan- HD today, UF 2-3 kg as tolerated, repeat CXR am    Kelly Splinter MD  pager [phone]    cell [phone]  08/21/2013, 10:36 AM     Recent Labs Lab 08/16/13 0220 08/17/13 0325 08/18/13 0248 08/19/13 0245  08/20/13 0400 08/20/13 1600 08/20/13 1640 08/21/13 0155  NA 142 139 137 141  < > 135*  --  136* 134*  K 4.1 4.8 4.3 4.0  < > 5.1  --  3.5* 4.4  CL 97 98 93* 97  < > 99  --  97 94*  CO2 30 22 28 30   --  20  --   --  25  GLUCOSE 95 105* 121* 71  < > 94  --  112* 104*  BUN 21 38* 25* 17  < > 25*  --  14 19  CREATININE 6.01* 8.35* 6.46* 5.36*  < > 6.92* 4.09* 4.40* 5.63*  CALCIUM 9.6 10.1 10.2 9.9  --  9.5  --   --  9.3  PHOS 4.3 5.8* 5.4*  --   --   --   --   --   --   < > = values in this interval not displayed.  Recent Labs Lab 08/17/13 0325 08/18/13 0248 08/19/13 0245  AST  --   --  10  ALT  --   --  7  ALKPHOS  --   --  84  BILITOT  --   --  0.3  PROT  --   --  6.6  ALBUMIN  3.5 3.6 3.3*    Recent Labs Lab 08/20/13 0400 08/20/13 1600 08/20/13 1640 08/21/13 0155  WBC 13.2* 16.4*  --  14.5*  HGB 8.8* 9.3* 10.5* 8.8*  HCT 27.0* 28.5* 31.0* 26.7*  MCV 96.1 94.7  --  95.0  PLT 188 166  --  171   . acetaminophen  1,000 mg Oral 4 times per day   Or  . acetaminophen (TYLENOL) oral liquid 160 mg/5 mL  1,000 mg Per Tube 4 times per day  . amiodarone  150 mg Intravenous Once  . amiodarone  200  mg Oral Q12H   Followed by  . [START ON 08/29/2013] amiodarone  200 mg Oral Daily  . antiseptic oral rinse  7 mL Mouth Rinse BID  . aspirin EC  325 mg Oral Daily   Or  . aspirin  324 mg Per Tube Daily  . atorvastatin  20 mg Oral q morning - 10a  . bisacodyl  10 mg Oral Daily   Or  . bisacodyl  10 mg Rectal Daily  . docusate sodium  200 mg Oral Daily  . enoxaparin (LOVENOX) injection  30 mg Subcutaneous Q24H  . insulin aspart  0-24 Units Subcutaneous 6 times per day  . insulin regular  0-10 Units Intravenous TID WC  . levalbuterol  0.63 mg Nebulization Q6H  . magnesium sulfate  4 g Intravenous Once  . metoprolol tartrate  12.5 mg Oral BID   Or  . metoprolol tartrate  12.5 mg Per Tube BID  . pantoprazole  40 mg Oral Daily  . sodium chloride  3 mL Intravenous Q12H   . sodium chloride 10 mL/hr at 08/21/13 0500  . sodium chloride    . amiodarone 60 mg/hr (08/21/13 0930)   Followed by  . amiodarone    . dexmedetomidine Stopped (08/19/13 1700)  . insulin (NOVOLIN-R) infusion Stopped (08/20/13 1000)  . lactated ringers    . nitroGLYCERIN Stopped (08/19/13 1630)  . phenylephrine (NEO-SYNEPHRINE) Adult infusion 30 mcg/min (08/21/13 0945)   metoprolol, morphine injection, ondansetron (ZOFRAN) IV, oxyCODONE, sodium chloride

## 2013-08-21 NOTE — Op Note (Signed)
NAME:  [name] [name], [name] [name] NO.:  [id number]  MEDICAL RECORD NO.:  [id number]  LOCATION:                                 FACILITY:  PHYSICIAN:  Ala Dach, M.D.DATE OF BIRTH:  06-26-1945  DATE OF PROCEDURE:  08/20/2013 DATE OF DISCHARGE:                              OPERATIVE REPORT   INTRAOPERATIVE TRANSESOPHAGEAL ECHOCARDIOGRAPHIC REPORT:  INDICATION FOR PROCEDURE:  Mr. Lacko is a 68 year old gentleman, a patient of Dr. Lanelle Bal, who presents today for coronary artery bypass grafting.  We have been asked to place a TEE probe for evaluation of cardiac structures and function during the procedure.  The patient is brought to the holding area on the morning of surgery where under local anesthesia with sedation, pulmonary artery and radial arterial lines were placed.  He is then taken to the OR for routine induction of general anesthesia, after which the TEE probe was prepared, then passed oropharyngeally into the stomach and slightly withdrawn for imaging of the cardiac structures.  PRECARDIOPULMONARY BYPASS TEE EXAMINATION:  Left ventricle:  The left ventricular chamber is seen initially in the short axis view.  There is a moderately decreased left ventricular function appreciated in the short axis view.  There is severe hypokinesis noted in the anteroseptal and septal wall areas.  There is also hypokinesis appreciated in the inferior wall of the left ventricle in the short axis view.  There is moderate contractility appreciated in the lateral and posterior wall aspects.  Overall, the left ventricular wall diameter is moderately increased in a pattern of left ventricular hypertrophy.  Estimated ejection fraction is approximately 30-35%.  Mitral valve:  The mitral valve is seen initially in the four-chamber view.  The leaflets are normal in their appearance.  There is normal excursion and apparently normal coaptation appreciated somewhat in  this four-chamber view.  On color Doppler, there is mild mitral regurgitant flow in the jet that is seen centrally.  On further and closer examination and with multiple views, these are actually in accumulation of several small jets appearing centrally through the mitral valve apparatus.  Pulse wave Doppler interrogation across the mitral valve is consistent with a pattern of left ventricular diastolic dysfunction.  Aortic valve:  The aortic valve is seen initially in the short axis view.  It is trileaflet.  The leaflets are thin, compliant and mobile, although some mild aortic sclerosis appreciated.  This is a competent aortic valve.  There is just a trace insufficiency jet appreciated.  Right ventricle:  Normal contractile right ventricular chamber.  Tricuspid valve:  Normal compliant mobile tricuspid valve.  Right atrium:  Normal size.  Right atrial chamber is appreciated.  Left atrium:  Left atrial chamber is visualized and is interrogated. The left atrial appendage is visualized.  There are no masses noted within.  The patient is placed on cardiopulmonary bypass.  Coronary artery bypass grafting is carried out.  Rewarming his plan as the patient is ultimately separated from cardiopulmonary bypass with the initial attempt.  POST CARDIOPULMONARY BYPASS TEE EXAMINATION (LIMITED EXAM):  Left ventricle:  The left ventricular chamber is seen now in the short axis view.  There is a  vigorous left ventricular chamber function appreciated in the short axis view.  There is good anterior wall and inferolateral wall contractility appreciated.  There is in fact more vigorous contractile pattern noted in the anteroseptal and septal areas which were significantly hypocontractile in the prebypass.  Ejection fraction is now estimated at greater than 40%.  Multiple views are again obtained.  There did remain some mild area of hypokinesis in that septal and inferior wall.  Mitral valve:   Attention was then drawn to the mitral valve again in the four-chamber view, the mild mitral regurgitant flow remained, but it is certainly no worse than in the prebypass.  Attention to the rest of the chambers of the heart revealed no significant changes from the prebypass.  The patient was returned to the cardiac intensive care unit in stable condition.          ______________________________ Ala Dach, M.D.     JTM/MEDQ  D:  08/20/2013  T:  08/21/2013  Job:  [id number]

## 2013-08-21 NOTE — Progress Notes (Signed)
  Amiodarone Drug - Drug Interaction Consult Note  Recommendations:  68yo male admitted 08/13/2013  c/o sudden onset of CP>>3vCAD >> 8/10 CABG x5, [facility] LLE  Amiodarone is metabolized by the cytochrome P450 system and therefore has the potential to cause many drug interactions. Amiodarone has an average plasma half-life of 50 days (range 20 to 100 days).   Consider possible myopathy with concurrent statin and bradycardia with concurrent beta blocker  There is potential for drug interactions to occur several weeks or months after stopping treatment and the onset of drug interactions may be slow after initiating amiodarone.   [x]  Statins: Increased risk of myopathy. Simvastatin- restrict dose to 20mg  daily. Other statins: counsel patients to report any muscle pain or weakness immediately.  []  Anticoagulants: Amiodarone can increase anticoagulant effect. Consider warfarin dose reduction. Patients should be monitored closely and the dose of anticoagulant altered accordingly, remembering that amiodarone levels take several weeks to stabilize.  []  Antiepileptics: Amiodarone can increase plasma concentration of phenytoin, the dose should be reduced. Note that small changes in phenytoin dose can result in large changes in levels. Monitor patient and counsel on signs of toxicity.  [x]  Beta blockers: increased risk of bradycardia, AV block and myocardial depression. Sotalol - avoid concomitant use.  []   Calcium channel blockers (diltiazem and verapamil): increased risk of bradycardia, AV block and myocardial depression.  []   Cyclosporine: Amiodarone increases levels of cyclosporine. Reduced dose of cyclosporine is recommended.  []  Digoxin dose should be halved when amiodarone is started.  []  Diuretics: increased risk of cardiotoxicity if hypokalemia occurs.  []  Oral hypoglycemic agents (glyburide, glipizide, glimepiride): increased risk of hypoglycemia. Patient's glucose levels should be monitored  closely when initiating amiodarone therapy.   []  Drugs that prolong the QT interval:  Torsades de pointes risk may be increased with concurrent use - avoid if possible.  Monitor QTc, also keep magnesium/potassium WNL if concurrent therapy can't be avoided. Marland Kitchen Antibiotics: e.g. fluoroquinolones, erythromycin. . Antiarrhythmics: e.g. quinidine, procainamide, disopyramide, sotalol. . Antipsychotics: e.g. phenothiazines, haloperidol.  . Lithium, tricyclic antidepressants, and methadone. Thank You,  Rob Mciver Rickey Primus  08/21/2013 9:07 AM

## 2013-08-22 ENCOUNTER — Encounter (HOSPITAL_COMMUNITY): Payer: Self-pay | Admitting: Cardiothoracic Surgery

## 2013-08-22 ENCOUNTER — Inpatient Hospital Stay (HOSPITAL_COMMUNITY): Payer: Medicare Other

## 2013-08-22 LAB — TYPE AND SCREEN
ABO/RH(D): B POS
Antibody Screen: NEGATIVE
Unit division: 0
Unit division: 0
Unit division: 0
Unit division: 0

## 2013-08-22 LAB — BASIC METABOLIC PANEL
Anion gap: 14 (ref 5–15)
BUN: 19 mg/dL (ref 6–23)
CO2: 27 mEq/L (ref 19–32)
Calcium: 9.3 mg/dL (ref 8.4–10.5)
Chloride: 100 mEq/L (ref 96–112)
Creatinine, Ser: 5.11 mg/dL — ABNORMAL HIGH (ref 0.50–1.35)
GFR calc Af Amer: 12 mL/min — ABNORMAL LOW (ref >90)
GFR calc non Af Amer: 11 mL/min — ABNORMAL LOW (ref >90)
Glucose, Bld: 87 mg/dL (ref 70–99)
Potassium: 4.2 mEq/L (ref 3.7–5.3)
Sodium: 141 mEq/L (ref 137–147)

## 2013-08-22 LAB — GLUCOSE, CAPILLARY
GLUCOSE-CAPILLARY: 91 mg/dL (ref 70–99)
GLUCOSE-CAPILLARY: 78 mg/dL (ref 70–99)
Glucose-Capillary: 91 mg/dL (ref 70–99)
Glucose-Capillary: 120 mg/dL — ABNORMAL HIGH (ref 70–99)
Glucose-Capillary: 75 mg/dL (ref 70–99)

## 2013-08-22 LAB — CBC
HCT: 24.2 % — ABNORMAL LOW (ref 39.0–52.0)
Hemoglobin: 8 g/dL — ABNORMAL LOW (ref 13.0–17.0)
MCH: 31.4 pg (ref 26.0–34.0)
MCHC: 33.1 g/dL (ref 30.0–36.0)
MCV: 94.9 fL (ref 78.0–100.0)
Platelets: 160 10*3/uL (ref 150–400)
RBC: 2.55 MIL/uL — ABNORMAL LOW (ref 4.22–5.81)
RDW: 15.5 % (ref 11.5–15.5)
WBC: 10.6 10*3/uL — ABNORMAL HIGH (ref 4.0–10.5)

## 2013-08-22 MED ORDER — DOCUSATE SODIUM 100 MG PO CAPS
200.0000 mg | ORAL_CAPSULE | Freq: Every day | ORAL | Status: DC
  Administered 2013-08-22 – 2013-09-06 (×13): 200 mg via ORAL
  Filled 2013-08-22 (×16): qty 2

## 2013-08-22 MED ORDER — LEVALBUTEROL HCL 0.63 MG/3ML IN NEBU
0.6300 mg | INHALATION_SOLUTION | Freq: Three times a day (TID) | RESPIRATORY_TRACT | Status: DC
  Administered 2013-08-22 – 2013-08-24 (×6): 0.63 mg via RESPIRATORY_TRACT
  Filled 2013-08-22 (×17): qty 3

## 2013-08-22 MED ORDER — DARBEPOETIN ALFA-POLYSORBATE 25 MCG/0.42ML IJ SOLN
25.0000 ug | INTRAMUSCULAR | Status: DC
  Administered 2013-08-22 – 2013-09-05 (×3): 25 ug via INTRAVENOUS
  Filled 2013-08-22 (×3): qty 0.42

## 2013-08-22 MED ORDER — PENTAFLUOROPROP-TETRAFLUOROETH EX AERO
INHALATION_SPRAY | CUTANEOUS | Status: AC
  Administered 2013-08-22: 1 via TOPICAL
  Filled 2013-08-22: qty 103.5

## 2013-08-22 MED ORDER — DARBEPOETIN ALFA-POLYSORBATE 25 MCG/0.42ML IJ SOLN
INTRAMUSCULAR | Status: AC
  Filled 2013-08-22: qty 0.42

## 2013-08-22 MED ORDER — GLIPIZIDE ER 2.5 MG PO TB24
2.5000 mg | ORAL_TABLET | Freq: Every day | ORAL | Status: DC
  Administered 2013-08-22 – 2013-08-26 (×4): 2.5 mg via ORAL
  Filled 2013-08-22 (×7): qty 1

## 2013-08-22 MED ORDER — PANTOPRAZOLE SODIUM 40 MG PO TBEC
40.0000 mg | DELAYED_RELEASE_TABLET | Freq: Every day | ORAL | Status: DC
  Administered 2013-08-22 – 2013-09-05 (×13): 40 mg via ORAL
  Filled 2013-08-22 (×15): qty 1

## 2013-08-22 MED ORDER — SODIUM CHLORIDE 0.9 % IV SOLN: 100.0000 mL | INTRAVENOUS | Status: DC | PRN

## 2013-08-22 MED ORDER — SODIUM CHLORIDE 0.9 % IV SOLN: 250.0000 mL | INTRAVENOUS | Status: DC | PRN

## 2013-08-22 MED ORDER — ALBUMIN HUMAN 25 % IV SOLN
INTRAVENOUS | Status: AC
  Administered 2013-08-22: 25 g via INTRAVENOUS
  Filled 2013-08-22: qty 100

## 2013-08-22 MED ORDER — SODIUM CHLORIDE 0.9 % IJ SOLN
3.0000 mL | INTRAMUSCULAR | Status: DC | PRN
  Administered 2013-09-03: 3 mL via INTRAVENOUS

## 2013-08-22 MED ORDER — ASPIRIN EC 325 MG PO TBEC
325.0000 mg | DELAYED_RELEASE_TABLET | Freq: Every day | ORAL | Status: DC
  Administered 2013-08-22 – 2013-09-06 (×16): 325 mg via ORAL
  Filled 2013-08-22 (×16): qty 1

## 2013-08-22 MED ORDER — MOVING RIGHT ALONG BOOK
Freq: Once | Status: AC
  Administered 2013-08-22: 08:00:00
  Filled 2013-08-22: qty 1

## 2013-08-22 MED ORDER — PENTAFLUOROPROP-TETRAFLUOROETH EX AERO
1.0000 "application " | INHALATION_SPRAY | CUTANEOUS | Status: DC | PRN
  Administered 2013-08-22: 1 via TOPICAL

## 2013-08-22 MED ORDER — LIDOCAINE-PRILOCAINE 2.5-2.5 % EX CREA: 1.0000 "application " | TOPICAL_CREAM | CUTANEOUS | Status: DC | PRN

## 2013-08-22 MED ORDER — BISACODYL 10 MG RE SUPP: 10.0000 mg | Freq: Every day | RECTAL | Status: DC | PRN

## 2013-08-22 MED ORDER — BISACODYL 5 MG PO TBEC
10.0000 mg | DELAYED_RELEASE_TABLET | Freq: Every day | ORAL | Status: DC | PRN
  Administered 2013-08-29: 10 mg via ORAL
  Filled 2013-08-22 (×2): qty 2

## 2013-08-22 MED ORDER — HEPARIN SODIUM (PORCINE) 1000 UNIT/ML DIALYSIS: 2000.0000 [IU] | INTRAMUSCULAR | Status: DC | PRN

## 2013-08-22 MED ORDER — ALTEPLASE 2 MG IJ SOLR
2.0000 mg | Freq: Once | INTRAMUSCULAR | Status: DC | PRN
  Filled 2013-08-22: qty 2

## 2013-08-22 MED ORDER — LIDOCAINE HCL (PF) 1 % IJ SOLN: 5.0000 mL | INTRAMUSCULAR | Status: DC | PRN

## 2013-08-22 MED ORDER — NEPRO/CARBSTEADY PO LIQD
237.0000 mL | ORAL | Status: DC | PRN
  Filled 2013-08-22: qty 237

## 2013-08-22 MED ORDER — INSULIN ASPART 100 UNIT/ML ~~LOC~~ SOLN
0.0000 [IU] | Freq: Three times a day (TID) | SUBCUTANEOUS | Status: DC
  Administered 2013-08-28 – 2013-08-30 (×2): 4 [IU] via SUBCUTANEOUS
  Administered 2013-09-02 (×2): 2 [IU] via SUBCUTANEOUS
  Administered 2013-09-03: 3 [IU] via SUBCUTANEOUS
  Administered 2013-09-05: 2 [IU] via SUBCUTANEOUS

## 2013-08-22 MED ORDER — HEPARIN SODIUM (PORCINE) 1000 UNIT/ML DIALYSIS: 1000.0000 [IU] | INTRAMUSCULAR | Status: DC | PRN

## 2013-08-22 MED ORDER — ALBUMIN HUMAN 25 % IV SOLN
25.0000 g | Freq: Once | INTRAVENOUS | Status: AC
  Administered 2013-08-22: 25 g via INTRAVENOUS

## 2013-08-22 MED ORDER — OXYCODONE HCL 5 MG PO TABS
5.0000 mg | ORAL_TABLET | ORAL | Status: DC | PRN
  Administered 2013-08-22 – 2013-08-30 (×10): 5 mg via ORAL
  Filled 2013-08-22 (×10): qty 1

## 2013-08-22 MED ORDER — METOPROLOL TARTRATE 12.5 MG HALF TABLET
12.5000 mg | ORAL_TABLET | Freq: Two times a day (BID) | ORAL | Status: DC
  Administered 2013-08-22 (×2): 12.5 mg via ORAL
  Filled 2013-08-22 (×4): qty 1

## 2013-08-22 MED ORDER — SODIUM CHLORIDE 0.9 % IJ SOLN
3.0000 mL | Freq: Two times a day (BID) | INTRAMUSCULAR | Status: DC
  Administered 2013-08-22 – 2013-09-05 (×24): 3 mL via INTRAVENOUS

## 2013-08-22 MED ORDER — TAMSULOSIN HCL 0.4 MG PO CAPS
0.4000 mg | ORAL_CAPSULE | Freq: Every day | ORAL | Status: DC
  Administered 2013-08-22 – 2013-09-05 (×15): 0.4 mg via ORAL
  Filled 2013-08-22 (×16): qty 1

## 2013-08-22 MED ORDER — TRAMADOL HCL 50 MG PO TABS
50.0000 mg | ORAL_TABLET | Freq: Two times a day (BID) | ORAL | Status: DC | PRN
  Administered 2013-08-22 – 2013-08-26 (×4): 50 mg via ORAL
  Filled 2013-08-22 (×3): qty 1

## 2013-08-22 MED ORDER — GUAIFENESIN ER 600 MG PO TB12
600.0000 mg | ORAL_TABLET | Freq: Two times a day (BID) | ORAL | Status: DC | PRN
  Administered 2013-08-23: 600 mg via ORAL
  Filled 2013-08-22 (×2): qty 1

## 2013-08-22 NOTE — Significant Event (Addendum)
1800pm-Patient transferred safely to 2W23, via wheelchair; patient did not want to ambulate this evening. Patient ambulated 422feet this morning. VS stable prior and during the transfer. Patient's personal belongings included a cellphone and a charger, which are both with patient in his new room. Patient settled in bed, receiving RN Sandy in the room. Report given to [place]. Patient's family at the bedside. Orva Gwaltney, Therapist, sports.

## 2013-08-22 NOTE — Progress Notes (Signed)
Patient ID: [name] [name], male   DOB: 1945/07/01, 68 y.o.   MRN: [id number] TCTS DAILY ICU PROGRESS NOTE                   [address]            [place] [address]          [phone]   3 Days Post-Op Procedure(s) (LRB): CORONARY ARTERY BYPASS GRAFTING (CABG) times 5 using left internal mammary artery to LAD, saphenous vein graft to diagonal; sequential saphenous vein graft to intermediate and distal circumflex; and saphenous vein graft to right coronary artery  (N/A) INTRAOPERATIVE TRANSESOPHAGEAL ECHOCARDIOGRAM (N/A) ENDOVEIN HARVEST OF GREATER SAPHENOUS VEIN from left thigh and calf (Left)  Total Length of Stay:  LOS: 9 days   Subjective: Up to chair, walked in unit  Objective: Vital signs in last 24 hours: Temp:  [97.3 F (36.3 C)-98.7 F (37.1 C)] 98.7 F (37.1 C) (08/13 0345) Pulse Rate:  [84-108] 103 (08/13 0700) Cardiac Rhythm:  [-] Normal sinus rhythm (08/12 2000) Resp:  [10-33] 22 (08/13 0700) BP: (80-134)/(42-74) 105/74 mmHg (08/13 0700) SpO2:  [84 %-100 %] 94 % (08/13 0700) Arterial Line BP: (100-124)/(39-52) 120/45 mmHg (08/12 1700) Weight:  [166 lb 3.6 oz (75.4 kg)-176 lb 2.4 oz (79.9 kg)] 166 lb 3.6 oz (75.4 kg) (08/13 0500)  Filed Weights   08/21/13 1030 08/21/13 1419 08/22/13 0500  Weight: 176 lb 2.4 oz (79.9 kg) 170 lb 6.7 oz (77.3 kg) 166 lb 3.6 oz (75.4 kg)    Weight change: 3 lb 15.5 oz (1.8 kg)   Hemodynamic parameters for last 24 hours:    Intake/Output from previous day: 08/12 0701 - 08/13 0700 In: 1189.1 [P.O.:300; I.V.:889.1] Out: 2785 [Urine:15; Chest Tube:70]  Intake/Output this shift:    Current Meds: Scheduled Meds: . amiodarone  200 mg Oral Q12H   Followed by  . [START ON 08/29/2013] amiodarone  200 mg Oral Daily  . antiseptic oral rinse  7 mL Mouth Rinse BID  . atorvastatin  20 mg Oral q morning - 10a  . enoxaparin (LOVENOX) injection  30 mg Subcutaneous Q24H  . glipiZIDE  2.5 mg Oral Q breakfast  .  levalbuterol  0.63 mg Nebulization Q6H  . tamsulosin  0.4 mg Oral QHS   Continuous Infusions:   PRN Meds:.sodium chloride, sodium chloride, sodium chloride, sodium chloride, feeding supplement (NEPRO CARB STEADY), heparin, lidocaine (PF), lidocaine-prilocaine, pentafluoroprop-tetrafluoroeth  General appearance: alert, cooperative and no distress Neurologic: intact Heart: regular rate and rhythm, S1, S2 normal, no murmur, click, rub or gallop Lungs: diminished breath sounds bibasilar Abdomen: soft, non-tender; bowel sounds normal; no masses,  no organomegaly Extremities: extremities normal, atraumatic, no cyanosis or edema and Homans sign is negative, no sign of DVT Wound: dressing intact Chest tubes all out  Lab Results: CBC:  Recent Labs  08/21/13 0155 08/22/13 0500  WBC 14.5* 10.6*  HGB 8.8* 8.0*  HCT 26.7* 24.2*  PLT 171 160   BMET:   Recent Labs  08/21/13 0155 08/22/13 0500  NA 134* 141  K 4.4 4.2  CL 94* 100  CO2 25 27  GLUCOSE 104* 87  BUN 19 19  CREATININE 5.63* 5.11*  CALCIUM 9.3 9.3    PT/INR:   Recent Labs  08/19/13 1300  LABPROT 16.0*  INR 1.28   Radiology: Dg Chest Port 1 View  08/21/2013   CLINICAL DATA:  Coronary artery disease  EXAM: PORTABLE CHEST - 1 VIEW  COMPARISON:  August 20, 2013  FINDINGS: Swan-Ganz catheter has been removed. Cordis tip is in the superior vena cava. Chest tube is present on the left, unchanged. There is no appreciable pneumothorax.  There is persistent left lower lobe consolidation. There is also consolidation in the medial right base, stable. There is trace interstitial edema. Heart is mildly enlarged. The pulmonary vascularity is normal. No adenopathy.  IMPRESSION: Bilateral lower lobe consolidation, more on the left than on the right, stable. Trace interstitial edema. Stable cardiac enlargement. Tube and catheter positions as described without apparent pneumothorax.   Electronically Signed   By: Lowella Grip M.D.   On:  08/21/2013 08:03   Dg Chest Portable 1 View In Am  08/20/2013   CLINICAL DATA:  Postop CABG.  EXAM: PORTABLE CHEST - 1 VIEW  COMPARISON:  None.  FINDINGS: Left-sided chest tube and mediastinal drain are unchanged. There has been interval removal of the endotracheal tube and nasogastric tube. Right IJ Swan-Ganz catheter has tip over the right main pulmonary artery. Lungs are hypoinflated with left basilar opacification likely effusions/ atelectasis slightly worse. There is new mild hazy bilateral perihilar opacification likely vascular congestion. Mild stable cardiomegaly. Remainder of the exam is unchanged.  IMPRESSION: New mild bilateral perihilar opacification suggesting minimal interstitial edema. Slight worsening left base opacification likely atelectasis/effusion, although cannot exclude infection.  Tubes and lines as described.   Electronically Signed   By: Marin Olp M.D.   On: 08/20/2013 08:29     Assessment/Plan: S/P Procedure(s) (LRB): CORONARY ARTERY BYPASS GRAFTING (CABG) times 5 using left internal mammary artery to LAD, saphenous vein graft to diagonal; sequential saphenous vein graft to intermediate and distal circumflex; and saphenous vein graft to right coronary artery  (N/A) INTRAOPERATIVE TRANSESOPHAGEAL ECHOCARDIOGRAM (N/A) ENDOVEIN HARVEST OF GREATER SAPHENOUS VEIN from left thigh and calf (Left) Mobilize Diabetes control d/c tubes/lines See progression orders Plan transfer to stepdown    Mariadel Mruk B 08/22/2013 7:39 AM

## 2013-08-22 NOTE — Progress Notes (Signed)
[facility] Progress Note   Subjective: no complaints  Filed Vitals:   08/22/13 0600 08/22/13 0700 08/22/13 0743 08/22/13 0800  BP: 134/63 105/74 105/74 136/53  Pulse: 105 103  94  Temp:   98.4 F (36.9 C)   TempSrc:   Oral   Resp: 28 22 17 17   Height:      Weight:      SpO2: 95% 94% 91% 95%   Exam: Alert, no distress, tired No jvd Chest faint bibasilar rales RRR 1/6 SEM no RG Abd soft, NTND Mild edema LE's Neuro is alert and Ox 3 Left arm AVF +bruit  CXR 8/13 edema resolved  HD: TTS North 4h   F180   400/800   2/2.0 Bath   73kg  Heparin 4000   L arm AVF Calcitriol 0.5 ug TIW, Venofer 50 mg q Thursday  pth 1000, P 5.6  tsat 16%  Hb 12     Assessment: 1 NSTEMI / CAD- s/p CABG 8/11 2 ESRD on HD 3 Volume - +4kg now, improving 4 Anemia started aranesp 25 ug/wk 5 HPTH on vit D, binders when eating, 2Ca bath 6 DM per primary   Plan- HD today 3 hr w UF 2-3 kg, continue TTS schedule from here    Kelly Splinter MD  pager [phone]    cell [phone]  08/22/2013, 9:17 AM     Recent Labs Lab 08/16/13 0220 08/17/13 0325 08/18/13 0248  08/20/13 0400  08/20/13 1640 08/21/13 0155 08/22/13 0500  NA 142 139 137  < > 135*  --  136* 134* 141  K 4.1 4.8 4.3  < > 5.1  --  3.5* 4.4 4.2  CL 97 98 93*  < > 99  --  97 94* 100  CO2 30 22 28   < > 20  --   --  25 27  GLUCOSE 95 105* 121*  < > 94  --  112* 104* 87  BUN 21 38* 25*  < > 25*  --  14 19 19   CREATININE 6.01* 8.35* 6.46*  < > 6.92*  < > 4.40* 5.63* 5.11*  CALCIUM 9.6 10.1 10.2  < > 9.5  --   --  9.3 9.3  PHOS 4.3 5.8* 5.4*  --   --   --   --   --   --   < > = values in this interval not displayed.  Recent Labs Lab 08/17/13 0325 08/18/13 0248 08/19/13 0245  AST  --   --  10  ALT  --   --  7  ALKPHOS  --   --  84  BILITOT  --   --  0.3  PROT  --   --  6.6  ALBUMIN 3.5 3.6 3.3*    Recent Labs Lab 08/20/13 1600 08/20/13 1640 08/21/13 0155 08/22/13 0500  WBC 16.4*  --  14.5* 10.6*   HGB 9.3* 10.5* 8.8* 8.0*  HCT 28.5* 31.0* 26.7* 24.2*  MCV 94.7  --  95.0 94.9  PLT 166  --  171 160   . amiodarone  200 mg Oral Q12H   Followed by  . [START ON 08/29/2013] amiodarone  200 mg Oral Daily  . antiseptic oral rinse  7 mL Mouth Rinse BID  . aspirin EC  325 mg Oral Daily  . atorvastatin  20 mg Oral q morning - 10a  . docusate sodium  200 mg Oral Daily  . enoxaparin (LOVENOX) injection  30 mg Subcutaneous Q24H  .  glipiZIDE  2.5 mg Oral Q breakfast  . insulin aspart  0-24 Units Subcutaneous TID AC & HS  . levalbuterol  0.63 mg Nebulization TID  . metoprolol tartrate  12.5 mg Oral BID  . pantoprazole  40 mg Oral QAC breakfast  . sodium chloride  3 mL Intravenous Q12H  . tamsulosin  0.4 mg Oral QHS     sodium chloride, sodium chloride, sodium chloride, sodium chloride, sodium chloride, bisacodyl, bisacodyl, feeding supplement (NEPRO CARB STEADY), guaiFENesin, heparin, lidocaine (PF), lidocaine-prilocaine, oxyCODONE, pentafluoroprop-tetrafluoroeth, sodium chloride, traMADol

## 2013-08-22 NOTE — Procedures (Signed)
I was present at this dialysis session, have reviewed the session itself and made  appropriate changes  Kelly Splinter MD (pgr) [phone]    ([phone] 08/22/2013, 1:37 PM

## 2013-08-22 NOTE — Progress Notes (Signed)
08/22/2013 11:22 PM Progress note The patient was asked several times this evening if he could ambulate in the hallway a little. The patient refused to ambulate tonight.  Will continue to monitor patient progress and to encourage activity as tolerated. Lupita Dawn

## 2013-08-23 ENCOUNTER — Inpatient Hospital Stay (HOSPITAL_COMMUNITY): Payer: Medicare Other

## 2013-08-23 DIAGNOSIS — J9383 Other pneumothorax: Secondary | ICD-10-CM

## 2013-08-23 LAB — BASIC METABOLIC PANEL
Anion gap: 16 — ABNORMAL HIGH (ref 5–15)
BUN: 19 mg/dL (ref 6–23)
CO2: 26 mEq/L (ref 19–32)
Calcium: 9.8 mg/dL (ref 8.4–10.5)
Chloride: 100 mEq/L (ref 96–112)
Creatinine, Ser: 5.15 mg/dL — ABNORMAL HIGH (ref 0.50–1.35)
GFR calc Af Amer: 12 mL/min — ABNORMAL LOW (ref >90)
GFR calc non Af Amer: 11 mL/min — ABNORMAL LOW (ref >90)
Glucose, Bld: 76 mg/dL (ref 70–99)
Potassium: 4.6 mEq/L (ref 3.7–5.3)
Sodium: 142 mEq/L (ref 137–147)

## 2013-08-23 LAB — GLUCOSE, CAPILLARY
Glucose-Capillary: 106 mg/dL — ABNORMAL HIGH (ref 70–99)
Glucose-Capillary: 124 mg/dL — ABNORMAL HIGH (ref 70–99)
Glucose-Capillary: 99 mg/dL (ref 70–99)
Glucose-Capillary: 113 mg/dL — ABNORMAL HIGH (ref 70–99)

## 2013-08-23 LAB — CBC
HCT: 26.7 % — ABNORMAL LOW (ref 39.0–52.0)
Hemoglobin: 8.7 g/dL — ABNORMAL LOW (ref 13.0–17.0)
MCH: 31.4 pg (ref 26.0–34.0)
MCHC: 32.6 g/dL (ref 30.0–36.0)
MCV: 96.4 fL (ref 78.0–100.0)
Platelets: 220 10*3/uL (ref 150–400)
RBC: 2.77 MIL/uL — ABNORMAL LOW (ref 4.22–5.81)
RDW: 16 % — ABNORMAL HIGH (ref 11.5–15.5)
WBC: 10.4 10*3/uL (ref 4.0–10.5)

## 2013-08-23 MED ORDER — ENOXAPARIN SODIUM 30 MG/0.3ML ~~LOC~~ SOLN
30.0000 mg | SUBCUTANEOUS | Status: DC
  Administered 2013-08-24 – 2013-09-05 (×13): 30 mg via SUBCUTANEOUS
  Filled 2013-08-23 (×14): qty 0.3

## 2013-08-23 MED ORDER — VANCOMYCIN HCL IN DEXTROSE 1-5 GM/200ML-% IV SOLN
1000.0000 mg | Freq: Once | INTRAVENOUS | Status: AC
Start: 2013-08-23 — End: 2013-08-23
  Administered 2013-08-23: 1000 mg via INTRAVENOUS
  Filled 2013-08-23: qty 200

## 2013-08-23 MED ORDER — MORPHINE SULFATE 4 MG/ML IJ SOLN
4.0000 mg | Freq: Once | INTRAMUSCULAR | Status: AC
  Administered 2013-08-23: 4 mg via INTRAVENOUS

## 2013-08-23 MED ORDER — MORPHINE SULFATE 4 MG/ML IJ SOLN
INTRAMUSCULAR | Status: AC
  Filled 2013-08-23: qty 1

## 2013-08-23 MED ORDER — MORPHINE BOLUS VIA INFUSION
4.0000 mg | Freq: Once | INTRAVENOUS | Status: AC
  Filled 2013-08-23: qty 4

## 2013-08-23 MED ORDER — ATORVASTATIN CALCIUM 20 MG PO TABS
20.0000 mg | ORAL_TABLET | Freq: Every day | ORAL | Status: DC
  Administered 2013-08-23 – 2013-09-05 (×14): 20 mg via ORAL
  Filled 2013-08-23 (×15): qty 1

## 2013-08-23 MED ORDER — METOPROLOL TARTRATE 25 MG PO TABS
25.0000 mg | ORAL_TABLET | Freq: Two times a day (BID) | ORAL | Status: DC
  Administered 2013-08-23 (×2): 25 mg via ORAL
  Filled 2013-08-23 (×4): qty 1

## 2013-08-23 NOTE — Progress Notes (Signed)
[facility] Progress Note   Subjective: no complaints, transferred to floor bed  Filed Vitals:   08/23/13 0820 08/23/13 0823 08/23/13 0905 08/23/13 1114  BP: 117/64  109/70 122/63  Pulse:    102  Temp:      TempSrc:      Resp:      Height:      Weight:      SpO2:  98% 98%    Exam: Alert, no distress, calm  No jvd Chest faint bibasilar rales RRR 1/6 SEM no RG Abd soft, NTND Mild edema LE's Neuro is alert and Ox 3 Left arm AVF +bruit  CXR 8/13 edema resolved  HD: TTS North 4h   F180   400/800   2/2.0 Bath   73kg  Heparin 4000   L arm AVF Calcitriol 0.5 ug TIW, Venofer 50 mg q Thursday  pth 1000, P 5.6  tsat 16%  Hb 12     Assessment: 1 NSTEMI / CAD- s/p CABG 8/11 2 ESRD on HD 3 Volume excess - resolving, 1kg up today 4 Anemia started aranesp 25 ug/wk, Hb 8.7 5 HPTH on vit D, binders when eating, 2Ca bath 6 DM per primary   Plan- HD tomorrow    Kelly Splinter MD  pager [phone]    cell [phone]  08/23/2013, 11:47 AM     Recent Labs Lab 08/17/13 0325 08/18/13 0248  08/21/13 0155 08/22/13 0500 08/23/13 0402  NA 139 137  < > 134* 141 142  K 4.8 4.3  < > 4.4 4.2 4.6  CL 98 93*  < > 94* 100 100  CO2 22 28  < > 25 27 26   GLUCOSE 105* 121*  < > 104* 87 76  BUN 38* 25*  < > 19 19 19   CREATININE 8.35* 6.46*  < > 5.63* 5.11* 5.15*  CALCIUM 10.1 10.2  < > 9.3 9.3 9.8  PHOS 5.8* 5.4*  --   --   --   --   < > = values in this interval not displayed.  Recent Labs Lab 08/17/13 0325 08/18/13 0248 08/19/13 0245  AST  --   --  10  ALT  --   --  7  ALKPHOS  --   --  84  BILITOT  --   --  0.3  PROT  --   --  6.6  ALBUMIN 3.5 3.6 3.3*    Recent Labs Lab 08/21/13 0155 08/22/13 0500 08/23/13 0402  WBC 14.5* 10.6* 10.4  HGB 8.8* 8.0* 8.7*  HCT 26.7* 24.2* 26.7*  MCV 95.0 94.9 96.4  PLT 171 160 220   . amiodarone  200 mg Oral Q12H   Followed by  . [START ON 08/29/2013] amiodarone  200 mg Oral Daily  . antiseptic oral rinse  7 mL  Mouth Rinse BID  . aspirin EC  325 mg Oral Daily  . atorvastatin  20 mg Oral q morning - 10a  . darbepoetin (ARANESP) injection - DIALYSIS  25 mcg Intravenous Q Thu-HD  . docusate sodium  200 mg Oral Daily  . enoxaparin (LOVENOX) injection  30 mg Subcutaneous Q24H  . glipiZIDE  2.5 mg Oral Q breakfast  . insulin aspart  0-24 Units Subcutaneous TID AC & HS  . levalbuterol  0.63 mg Nebulization TID  . metoprolol tartrate  25 mg Oral BID  . pantoprazole  40 mg Oral QAC breakfast  . sodium chloride  3 mL Intravenous Q12H  . tamsulosin  0.4  mg Oral QHS     sodium chloride, bisacodyl, bisacodyl, guaiFENesin, oxyCODONE, sodium chloride, traMADol

## 2013-08-23 NOTE — Discharge Summary (Signed)
Physician Discharge Summary  Patient ID: [name] [name] MRN: [id number] DOB/AGE: 04/27/1945 68 y.o.  Admit date: 08/13/2013 Discharge date: 08/23/2013  Admission Diagnoses:  Patient Active Problem List   Diagnosis Date Noted  . Sustained VT (ventricular tachycardia) 08/14/2013  . NSTEMI (non-ST elevated myocardial infarction) 08/13/2013  . Pulmonary hypertension 04/23/2013  . Systolic and diastolic CHF, acute on chronic 04/23/2013  . Dyspnea 04/21/2013  . Acute respiratory failure 04/21/2013  . Elevated troponin 04/21/2013  . ESRD on dialysis 04/21/2013  . Acute exacerbation of CHF (congestive heart failure) 04/21/2013  . Leukocytosis 04/21/2013  . Anemia 04/21/2013  . Murmur, cardiac 04/21/2013  . 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 Hyperparathyroidism, secondary 08/18/2010   Discharge Diagnoses:   Patient Active Problem List   Diagnosis Date Noted  . S/P CABG x 5 08/19/2013  . Sustained VT (ventricular tachycardia) 08/14/2013  . NSTEMI (non-ST elevated myocardial infarction) 08/13/2013  . Pulmonary hypertension 04/23/2013  . Systolic and diastolic CHF, acute on chronic 04/23/2013  . Dyspnea 04/21/2013  . Acute respiratory failure 04/21/2013  . Elevated troponin 04/21/2013  . ESRD on dialysis 04/21/2013  . Acute exacerbation of CHF (congestive heart failure) 04/21/2013  . Leukocytosis 04/21/2013  . Anemia 04/21/2013  . Murmur, cardiac 04/21/2013  . 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 Hyperparathyroidism, secondary 08/18/2010   Discharged Condition: good  History of Present Illness:   Mr. Sweetser is a 68 yo African American Male with Type II DM, ESRD on HD, HTN, and History of systolic and diastolic heart failure with moderate Mitral Regurgitation diagnosed in April of this year.  The  patient presented to the ED with a 2 day history of substernal chest discomfort that waxed and wane.  These episodes were associated with shortness of breath and generalized weakness.  Workup ruled the patient in for a NSTEMI.   Hospital Course:   Patient underwent cardiac catheterization which showed severe 3 vessel CAD and a reduced EF of 40%.  No significant MR was identified on catheterization.  It was felt Coronary Bypass grafting would be the best treatment option for the patient.  He remained chest pain free during admission.  TCTS consult was obtained and the patient was evaluated by Dr. Cyndia Bent who was in agreement CABG would be indicated.  The risks and benefits of the procedure were explained to the patient and he was agreeable to proceed.  The patient underwent scheduled dialysis throughout hospitalization in order to prepare for CABG.  He was taken to the operating room by Dr. Servando Snare on 08/19/2013.  He underwent CABG x 5 utilizing LIMA to LAD, SVG to Diagonal, Sequential SVG to Intermediate and Distal Circumflex, and SVG to RCA.  He also underwent Endoscopic Saphenous vein harvest from his left leg.  He tolerated the procedure without difficulty and was taken to the SICU in stable condition.  The patient was extubated the evening of surgery.  During his stay in the SICU he was closely monitored by Nephrology and dialysis was resumed.  He was weaned off Dopamine and Neo Synephrine as tolerated. His chest tubes and arterial lines were removed without difficulty.  The patient developed Rapid Atrial Fibrillation.  He was subsequently treated with IV Amiodarone with successful conversion to NSR.  The patient continued to progress and was transferred to the step down unit in stable condition.  The patient underwent CXR the morning of POD # 4.  This revealed a 100% pneumothorax on the left.  The patient had some minor left sided discomfort but was not in acute distress.  Left sided chest tube was placed by  Dr. Roxan Hockey which showed near complete resolution of the pneumothorax.  The patients follow up CXR showed increase in size of right pneumothorax.  He did also exhibit a small air leak.  He therefore remained on suction and repeat CXR showed improvement in his pneumothorax.  His chest tube continues to exhibit an air leak, but CXR remains free of pneumothorax on water seal. He has been placed on a mini-express chest tube system.  He continues to maintain NSR and his pacing wires were removed without difficulty.   He is on current TTS dialysis schedule. Incis ions are healing well. He is tolerating ambulation. He is requiring no supplemetal O2.   Consults: nephrology  Significant Diagnostic Studies: angiography:   Hemodynamics:  AO: 104/59 mmHg  LV: 102/2 mmHg  LVEDP: 5 mmHg  Coronary angiography:  Coronary dominance: Right  Left Main: Normal  Left Anterior Descending (LAD): Normal in size and moderately calcified proximally. There is a complex 80% proximal disease at the bifurcation of diagonal branch. The rest of the vessel has minor irregularities.  1st diagonal (D1): Small in size with 50% ostial stenosis.  2nd diagonal (D2): Large in size with 60-70% proximal stenosis.  3rd diagonal (D3): Medium in size with no significant disease.  Circumflex (LCx): Normal in size and nondominant. The vessel is mildly calcified. There is 80% stenosis in the mid segment.  1st obtuse marginal: Small in size with minor irregularities.  2nd obtuse marginal: large in size with 80% proximal stenosis.  3rd obtuse marginal: minor irregularities.  Right Coronary Artery: very large in size and dominant. There is a 99% proximal stenosis. There is a 40% mid stenosis. The rest of the vessel has minor irregularities.  Posterior descending artery: very large in size with no significant disease.  Posterior AV segment: normal in size with 60% diffuse disease supplying a small area  Left ventriculography: Left  ventricular systolic function is mildly reduced , LVEF is estimated at 40 %, there is no significant mitral regurgitation   Treatments: dialysis: Hemodialysis and surgery:   Coronary artery bypass grafting x5 with the left internal mammary to the left anterior descending coronary artery,  reverse saphenous vein graft to the diagonal coronary artery, sequential reverse saphenous vein graft to the intermediate coronary artery and distal circumflex, reverse saphenous vein graft to the distal right  coronary artery with left thigh and calf endo vein harvesting.  Disposition:SNF Discharge medications:   The patient has been discharged on:   Medication List    STOP taking these medications       amLODipine 5 MG tablet  Commonly known as:  NORVASC     aspirin 81 MG tablet  Replaced by:  aspirin 325 MG EC tablet     glipiZIDE 2.5 MG 24 hr tablet  Commonly known as:  GLUCOTROL XL      TAKE these medications       amiodarone 200 MG tablet  Commonly known as:  PACERONE  Take 1 tablet (200 mg total) by mouth daily.     aspirin 325 MG EC tablet  Take 1 tablet (325 mg total) by mouth daily.     atorvastatin 20 MG tablet  Commonly known as:  LIPITOR  Take 20 mg by mouth  every morning.     darbepoetin 25 MCG/0.42ML Soln injection  Commonly known as:  ARANESP  Inject 0.42 mLs (25 mcg total) into the vein every Thursday with hemodialysis.     ferric gluconate 125 mg in sodium chloride 0.9 % 100 mL  Inject 125 mg into the vein Every Tuesday,Thursday,and Saturday with dialysis.     metoprolol tartrate 12.5 mg Tabs tablet  Commonly known as:  LOPRESSOR  Take 0.5 tablets (12.5 mg total) by mouth 2 (two) times daily.     naphazoline-pheniramine 0.025-0.3 % ophthalmic solution  Commonly known as:  NAPHCON-A  Place 1 drop into both eyes 4 (four) times daily as needed for irritation.     oxyCODONE 5 MG immediate release tablet  Commonly known as:  Oxy IR/ROXICODONE  Take 1 tablet (5 mg  total) by mouth every 4 (four) hours as needed for severe pain.     pantoprazole 40 MG tablet  Commonly known as:  PROTONIX  Take 1 tablet (40 mg total) by mouth daily before breakfast.     polyethylene glycol packet  Commonly known as:  MIRALAX / GLYCOLAX  Take 17 g by mouth daily.     sevelamer carbonate 800 MG tablet  Commonly known as:  RENVELA  Take 1,600 mg by mouth 3 (three) times daily with meals.     tamsulosin 0.4 MG Caps capsule  Commonly known as:  FLOMAX  Take 0.4 mg by mouth at bedtime.        1.Beta Blocker:  Yes [x   ]                              No   [   ]                              If No, reason:  2.Ace Inhibitor/ARB: Yes [   ]                                     No  [   x ]                                     If No, reason: ESRD  3.Statin:   Yes [ x  ]                  No  [   ]                  If No, reason:  4.Ecasa:  Yes  [ x  ]                  No   [   ]                  If No, reason:   1. Please obtain vital signs at least one time daily 2.Please weigh the patient daily. If he or she continues to gain weight or develops lower extremity edema, contact the office at [phone]. 3. Ambulate patient at least three times daily and please use sternal precautions. 4 patient dialysis schedule is TTS 5 maintain chest tube care daily   Follow-up Information   Follow up with  [name], MD On 09/19/2013. (Appointment is at 3:15)    Specialty:  Cardiothoracic Surgery   Contact information:   [place] [id number] [phone]       Follow up with [facility] On 09/19/2013. (Please get CXR at 2:15, office is located on 1st floor of [facility])    Contact information:   [place]       Follow up with [facility] R, NP On 09/18/2013. (Appointment is at 9:30)    Specialty:  Cardiology   Contact information:   [address] [place] [address] [phone]       Signed: Ellwood Handler 08/23/2013, 10:49 AM

## 2013-08-23 NOTE — Progress Notes (Signed)
Received call from radiology about critical CXR shows large left pneumothorax with lung collapse.  Called Dr. Servando Snare and received order for bedside chest tube insertion tray. Explained to patient that CXR showed need for chest tube and MD would perform at bedside. Pt resting with call bell within reach.  Will continue to monitor. Payton Emerald, RN

## 2013-08-23 NOTE — Procedures (Signed)
After obtaining informed consent, patient given 4 mg morphine IV  Using sterile technique and 20 ml of 1 % lidocaine local anesthetic a 28 F chest tube was placed into the left pleural space. + rush of air and initial large air leak which resolved. Ct placed to suction. Patient tolerated well.  Chest x-ray pending

## 2013-08-23 NOTE — Progress Notes (Addendum)
      [address]       [place] [address]             [phone]      4 Days Post-Op Procedure(s) (LRB): CORONARY ARTERY BYPASS GRAFTING (CABG) times 5 using left internal mammary artery to LAD, saphenous vein graft to diagonal; sequential saphenous vein graft to intermediate and distal circumflex; and saphenous vein graft to right coronary artery  (N/A) INTRAOPERATIVE TRANSESOPHAGEAL ECHOCARDIOGRAM (N/A) ENDOVEIN HARVEST OF GREATER SAPHENOUS VEIN from left thigh and calf (Left)  Subjective:  [name] is doing okay this morning.  He does have some pain along his left side.  CXR was obtained this morning and showed a large Pneumothorax on the left.  + BM  Objective: Vital signs in last 24 hours: Temp:  [98 F (36.7 C)-100.7 F (38.2 C)] 98.9 F (37.2 C) (08/14 0413) Pulse Rate:  [96-120] 115 (08/14 0413) Cardiac Rhythm:  [-] Normal sinus rhythm;Sinus tachycardia (08/13 2054) Resp:  [22-32] 24 (08/14 0413) BP: (77-141)/(49-120) 127/70 mmHg (08/14 0413) SpO2:  [86 %-100 %] 98 % (08/14 0823) Weight:  [163 lb 1.6 oz (73.982 kg)-168 lb 10.4 oz (76.5 kg)] 163 lb 1.6 oz (73.982 kg) (08/14 0413)  Intake/Output from previous day: 08/13 0701 - 08/14 0700 In: 13 [I.V.:13] Out: 601   General appearance: alert, cooperative and no distress Heart: regular rate and rhythm Lungs: absent breath sounds on the left Abdomen: soft, non-tender; bowel sounds normal; no masses,  no organomegaly Extremities: edema trace Wound: clean and dry  Lab Results:  Recent Labs  08/22/13 0500 08/23/13 0402  WBC 10.6* 10.4  HGB 8.0* 8.7*  HCT 24.2* 26.7*  PLT 160 220   BMET:  Recent Labs  08/22/13 0500 08/23/13 0402  NA 141 142  K 4.2 4.6  CL 100 100  CO2 27 26  GLUCOSE 87 76  BUN 19 19  CREATININE 5.11* 5.15*  CALCIUM 9.3 9.8    PT/INR: No results found for this basename: LABPROT, INR,  in the last 72 hours ABG    Component Value Date/Time   PHART 7.358 08/19/2013  1958   HCO3 23.5 08/19/2013 1958   TCO2 24 08/20/2013 1640   ACIDBASEDEF 2.0 08/19/2013 1958   O2SAT 99.0 08/19/2013 1958   CBG (last 3)   Recent Labs  08/22/13 1657 08/22/13 2109 08/23/13 0648  GLUCAP 78 75 106*    Assessment/Plan: S/P Procedure(s) (LRB): CORONARY ARTERY BYPASS GRAFTING (CABG) times 5 using left internal mammary artery to LAD, saphenous vein graft to diagonal; sequential saphenous vein graft to intermediate and distal circumflex; and saphenous vein graft to right coronary artery  (N/A) INTRAOPERATIVE TRANSESOPHAGEAL ECHOCARDIOGRAM (N/A) ENDOVEIN HARVEST OF GREATER SAPHENOUS VEIN from left thigh and calf (Left)  1. CV- Sinus Tach- will continue Amiodarone, increase Lopressor to 25 mg BID 2. Pulm- CXR with Large Pneumothorax on the left, will require chest tube placement 3. Renal- ESRD, on dialysis Nephrology following 4. DM- sugars well controlled, on home oral medications 5. Dispo- patient hemodynamically stable, tachy will increase Beta Blocker, Large Pneumothorax on CXR, Chest tube to be placed by Dr. Roxan Hockey   LOS: 10 days    [name] [name] 08/23/2013  Chest tube in place no air leak For dialysis sat I have seen and examined [name] [name] and agree with the above assessment  and plan.  [name] Isaac MD [facility] [phone] Office [phone] 08/23/2013 4:28 PM

## 2013-08-23 NOTE — Progress Notes (Signed)
CTSP after CXR this AM showed a 100% pneumothorax on the left side  BP 127/70  Pulse 115  Temp(Src) 98.9 F (37.2 C) (Oral)  Resp 24  Ht 5' 4.96" (1.65 m)  Wt 163 lb 1.6 oz (73.982 kg)  BMI 27.17 kg/m2  SpO2 98%  He has some pain on the left flank, but is in no distress  Absent BS on left, clear on right  Needs a left chest tube  I discussed the indications, risks and benefits with him. He understands the risks of bleeding and infection and agrees to tube placement  Will give morphine IV as well as local

## 2013-08-23 NOTE — Progress Notes (Signed)
08/23/2013 10:35 PM Progress Note As the patient sat on the side of the bed to walk, it was noticed that a large amount of blood was on the left side of his bed pad. We looked at his left chest where a chest tube had been put in earlier today, and we saw that his dressing was drenched with blood and blood was leaking from the dressing.  The charge nurse was called in and the soiled dressing was removed. The chest tube was still in place and was draining properly, but blood was dripping from the side of the tube.  Noticeable blood clots were seen around the chest tube. It seemed that blood came out when he moved around too much. New gauze pads were applied at the insertion site. The patient did not appear to be in respiratory distress and denied trouble breathing. The patient's lungs were clear at the top and diminished toward the bases just as before. The patient was placed back in the bed to rest.  Will continue to monitor the patient's chest tube site frequently and respiratory function.  If the insertion side continues to drain a large amount of blood, the physician will be notified. Lupita Dawn

## 2013-08-23 NOTE — Progress Notes (Signed)
CARE MANAGEMENT NOTE 08/23/2013  Patient:  [name],[name]   Account Number:  [id number]  Date Initiated:  08/13/2013  Documentation initiated by:  [facility]  Subjective/Objective Assessment:   NSTEMI     Action/Plan:   Anticipated DC Date:  08/16/2013   Anticipated DC Plan:  [place]  CM consult      Choice offered to / List presented to:             Status of service:  In process, will continue to follow Medicare Important Message given?  YES (If response is "NO", the following Medicare IM given date fields will be blank) Date Medicare IM given:  08/23/2013 Medicare IM given by:  [facility] Date Additional Medicare IM given:   Additional Medicare IM given by:    Discharge Disposition:    Per UR Regulation:  Reviewed for med. necessity/level of care/duration of stay  If discussed at [place] of Stay Meetings, dates discussed:    Comments:  08/23/2013 1430 NCM spoke to pt and states he does not have 24 hour assistance at home. He is going to speak to his family before making decision. NCM discussed short term rehab, SNF. States he is unsure at this time. Jonnie Finner RN CCM Case Mgmt phone [phone]  08-15-13 11am Luz Lex, RNBSN [phone] Spoke with patient - plan for cardiac surgery next week. Lives at home alone, independent.  Informed that he will need someone with him 24/7 on discharge home.  States that he will be able to do that.  CM informed him CM will be following up with him on plan. Contact:  [place],[name] [name] Sister [phone]   [phone]

## 2013-08-23 NOTE — Progress Notes (Signed)
CARDIAC REHAB PHASE I   PRE:  Rate/Rhythm: 104 ST    BP: sitting 116/58    SaO2: 92 3L  MODE:  Ambulation: 150 ft   POST:  Rate/Rhythm: 111 ST    BP: sitting 112/60     SaO2: 87 6L  Pt able to stand and walk with little assistance, used RW. Began on 3L however after a 60 feet pt SaO2 83 RA. Needed 6L to increase to > 90. Second recheck at 120 ft 94 6L however after walking and returning to recliner, SaO2 87 6L. Up to 91 with rest. Returned to 3L in room in recliner. Pt declines elevating his feet. Encouraged to walk x3 qd and practice IS/flutter. [place], [place], ACSM 08/23/2013 2:01 PM

## 2013-08-23 NOTE — Discharge Instructions (Signed)
Coronary Artery Bypass Grafting, Care After °Refer to this sheet in the next few weeks. These instructions provide you with information on caring for yourself after your procedure. Your health care provider may also give you more specific instructions. Your treatment has been planned according to current medical practices, but problems sometimes occur. Call your health care provider if you have any problems or questions after your procedure. °WHAT TO EXPECT AFTER THE PROCEDURE °Recovery from surgery will be different for everyone. Some people feel well after 3 or 4 weeks, while for others it takes longer. After your procedure, it is typical to have the following: °· Nausea and a lack of appetite.   °· Constipation. °· Weakness and fatigue.   °· Depression or irritability.   °· Pain or discomfort at your incision site. °HOME CARE INSTRUCTIONS °· Take medicines only as directed by your health care provider. Do not stop taking medicines or start any new medicines without first checking with your health care provider. °· Take your pulse as directed by your health care provider. °· Perform deep breathing as directed by your health care provider. If you were given a device called an incentive spirometer, use it to practice deep breathing several times a day. Support your chest with a pillow or your arms when you take deep breaths or cough. °· Keep incision areas clean, dry, and protected. Remove or change any bandages (dressings) only as directed by your health care provider. You may have skin adhesive strips over the incision areas. Do not take the strips off. They will fall off on their own. °· Check incision areas daily for any swelling, redness, or drainage. °· If incisions were made in your legs, do the following: °¨ Avoid crossing your legs.   °¨ Avoid sitting for long periods of time. Change positions every 30 minutes.   °¨ Elevate your legs when you are sitting. °· Wear compression stockings as directed by your  health care provider. These stockings help keep blood clots from forming in your legs. °· Take showers once your health care provider approves. Until then, only take sponge baths. Pat incisions dry. Do not rub incisions with a washcloth or towel. Do not take baths, swim, or use a hot tub until your health care provider approves. °· Eat foods that are high in fiber, such as raw fruits and vegetables, whole grains, beans, and nuts. Meats should be lean cut. Avoid canned, processed, and fried foods. °· Drink enough fluid to keep your urine clear or pale yellow. °· Weigh yourself every day. This helps identify if you are retaining fluid that may make your heart and lungs work harder. °· Rest and limit activity as directed by your health care provider. You may be instructed to: °¨ Stop any activity at once if you have chest pain, shortness of breath, irregular heartbeats, or dizziness. Get help right away if you have any of these symptoms. °¨ Move around frequently for short periods or take short walks as directed by your health care provider. Increase your activities gradually. You may need physical therapy or cardiac rehabilitation to help strengthen your muscles and build your endurance. °¨ Avoid lifting, pushing, or pulling anything heavier than 10 lb (4.5 kg) for at least 6 weeks after surgery. °· Do not drive until your health care provider approves.  °· Ask your health care provider when you may return to work. °· Ask your health care provider when you may resume sexual activity. °· Keep all follow-up visits as directed by your health care   provider. This is important. °SEEK MEDICAL CARE IF: °· You have swelling, redness, increasing pain, or drainage at the site of an incision. °· You have a fever. °· You have swelling in your ankles or legs. °· You have pain in your legs.   °· You gain 2 or more pounds (0.9 kg) a day. °· You are nauseous or vomit. °· You have diarrhea.  °SEEK IMMEDIATE MEDICAL CARE IF: °· You have  chest pain that goes to your jaw or arms. °· You have shortness of breath.   °· You have a fast or irregular heartbeat.   °· You notice a "clicking" in your breastbone (sternum) when you move.   °· You have numbness or weakness in your arms or legs. °· You feel dizzy or light-headed.   °MAKE SURE YOU: °· Understand these instructions. °· Will watch your condition. °· Will get help right away if you are not doing well or get worse. °Document Released: 07/16/2004 Document Revised: 05/13/2013 Document Reviewed: 06/05/2012 °ExitCare® Patient Information ©2015 [employer]. This information is not intended to replace advice given to you by your health care provider. Make sure you discuss any questions you have with your health care provider. ° °Endoscopic Saphenous Vein Harvesting °Care After °Refer to this sheet in the next few weeks. These instructions provide you with information on caring for yourself after your procedure. Your health care provider may also give you more specific instructions. Your treatment has been planned according to current medical practices, but problems sometimes occur. Call your health care provider if you have any problems or questions after your procedure. °HOME CARE INSTRUCTIONS °Medicine °· Take whatever pain medicine your surgeon prescribes. Follow the directions carefully. Do not take over-the-counter pain medicine unless your surgeon says it is okay. Some pain medicine can cause bleeding problems for several weeks after surgery. °· Follow your surgeon's instructions about driving. You will probably not be permitted to drive after heart surgery. °· Take any medicines your surgeon prescribes. Any medicines you took before your heart surgery should be checked with your health care provider before you start taking them again. °Wound care °· If your surgeon has prescribed an elastic bandage or stocking, ask how long you should wear it. °· Check the area around your surgical cuts  (incisions) whenever your bandages (dressings) are changed. Look for any redness or swelling. °· You will need to return to have the stitches (sutures) or staples taken out. Ask your surgeon when to do that. °· Ask your surgeon when you can shower or bathe. °Activity °· Try to keep your legs raised when you are sitting. °· Do any exercises your health care providers have given you. These may include deep breathing exercises, coughing, walking, or other exercises. °SEEK MEDICAL CARE IF: °· You have any questions about your medicines. °· You have more leg pain, especially if your pain medicine stops working. °· New or growing bruises develop on your leg. °· Your leg swells, feels tight, or becomes red. °· You have numbness in your leg. °SEEK IMMEDIATE MEDICAL CARE IF: °· Your pain gets much worse. °· Blood or fluid leaks from any of the incisions. °· Your incisions become warm, swollen, or red. °· You have chest pain. °· You have trouble breathing. °· You have a fever. °· You have more pain near your leg incision. °MAKE SURE YOU: °· Understand these instructions. °· Will watch your condition. °· Will get help right away if you are not doing well or get worse. °Document Released: 09/08/2010   Document Revised: 01/01/2013 Document Reviewed: 09/08/2010 °ExitCare® Patient Information ©2015 [employer]. This information is not intended to replace advice given to you by your health care provider. Make sure you discuss any questions you have with your health care provider. ° ° °

## 2013-08-24 ENCOUNTER — Inpatient Hospital Stay (HOSPITAL_COMMUNITY): Payer: Medicare Other

## 2013-08-24 LAB — RENAL FUNCTION PANEL
ANION GAP: 16 — AB (ref 5–15)
Albumin: 2.6 g/dL — ABNORMAL LOW (ref 3.5–5.2)
BUN: 39 mg/dL — ABNORMAL HIGH (ref 6–23)
CO2: 26 meq/L (ref 19–32)
Calcium: 9.9 mg/dL (ref 8.4–10.5)
Chloride: 98 mEq/L (ref 96–112)
Creatinine, Ser: 8.04 mg/dL — ABNORMAL HIGH (ref 0.50–1.35)
GFR calc Af Amer: 7 mL/min — ABNORMAL LOW (ref >90)
GFR calc non Af Amer: 6 mL/min — ABNORMAL LOW (ref >90)
GLUCOSE: 91 mg/dL (ref 70–99)
PHOSPHORUS: 5.7 mg/dL — AB (ref 2.3–4.6)
POTASSIUM: 4.6 meq/L (ref 3.7–5.3)
Sodium: 140 mEq/L (ref 137–147)

## 2013-08-24 LAB — CBC
HCT: 22.6 % — ABNORMAL LOW (ref 39.0–52.0)
HEMOGLOBIN: 7.5 g/dL — AB (ref 13.0–17.0)
MCH: 31.1 pg (ref 26.0–34.0)
MCHC: 33.2 g/dL (ref 30.0–36.0)
MCV: 93.8 fL (ref 78.0–100.0)
PLATELETS: 257 10*3/uL (ref 150–400)
RBC: 2.41 MIL/uL — ABNORMAL LOW (ref 4.22–5.81)
RDW: 16.1 % — ABNORMAL HIGH (ref 11.5–15.5)
WBC: 9.9 10*3/uL (ref 4.0–10.5)

## 2013-08-24 LAB — GLUCOSE, CAPILLARY
GLUCOSE-CAPILLARY: 89 mg/dL (ref 70–99)
GLUCOSE-CAPILLARY: 99 mg/dL (ref 70–99)
GLUCOSE-CAPILLARY: 112 mg/dL — AB (ref 70–99)
Glucose-Capillary: 95 mg/dL (ref 70–99)

## 2013-08-24 MED ORDER — HEPARIN SODIUM (PORCINE) 1000 UNIT/ML DIALYSIS
2000.0000 [IU] | INTRAMUSCULAR | Status: DC | PRN
  Filled 2013-08-24: qty 2

## 2013-08-24 MED ORDER — SODIUM CHLORIDE 0.9 % IV SOLN
100.0000 mL | INTRAVENOUS | Status: DC | PRN
Start: 2013-08-24 — End: 2013-08-24

## 2013-08-24 MED ORDER — SEVELAMER CARBONATE 800 MG PO TABS
1600.0000 mg | ORAL_TABLET | Freq: Three times a day (TID) | ORAL | Status: DC
  Administered 2013-08-24 – 2013-09-02 (×19): 1600 mg via ORAL
  Administered 2013-09-03: 800 mg via ORAL
  Administered 2013-09-03 – 2013-09-04 (×3): 1600 mg via ORAL
  Administered 2013-09-04: 800 mg via ORAL
  Administered 2013-09-05 – 2013-09-06 (×3): 1600 mg via ORAL
  Filled 2013-08-24 (×42): qty 2

## 2013-08-24 MED ORDER — TRAMADOL HCL 50 MG PO TABS
ORAL_TABLET | ORAL | Status: AC
  Filled 2013-08-24: qty 1

## 2013-08-24 MED ORDER — METOPROLOL TARTRATE 12.5 MG HALF TABLET
12.5000 mg | ORAL_TABLET | Freq: Two times a day (BID) | ORAL | Status: DC
  Administered 2013-08-24 – 2013-09-05 (×19): 12.5 mg via ORAL
  Filled 2013-08-24 (×27): qty 1

## 2013-08-24 MED ORDER — LIDOCAINE-PRILOCAINE 2.5-2.5 % EX CREA: 1.0000 "application " | TOPICAL_CREAM | CUTANEOUS | Status: DC | PRN

## 2013-08-24 MED ORDER — PENTAFLUOROPROP-TETRAFLUOROETH EX AERO: 1.0000 "application " | INHALATION_SPRAY | CUTANEOUS | Status: DC | PRN

## 2013-08-24 MED ORDER — HEPARIN SODIUM (PORCINE) 1000 UNIT/ML DIALYSIS
1000.0000 [IU] | INTRAMUSCULAR | Status: DC | PRN
  Filled 2013-08-24: qty 1

## 2013-08-24 MED ORDER — NEPRO/CARBSTEADY PO LIQD
237.0000 mL | ORAL | Status: DC | PRN
  Filled 2013-08-24: qty 237

## 2013-08-24 MED ORDER — SODIUM CHLORIDE 0.9 % IV SOLN
62.5000 mg | INTRAVENOUS | Status: DC
  Administered 2013-08-29: 62.5 mg via INTRAVENOUS
  Filled 2013-08-24 (×2): qty 5

## 2013-08-24 MED ORDER — SODIUM CHLORIDE 0.9 % IV SOLN: 100.0000 mL | INTRAVENOUS | Status: DC | PRN

## 2013-08-24 MED ORDER — ALTEPLASE 2 MG IJ SOLR
2.0000 mg | Freq: Once | INTRAMUSCULAR | Status: DC | PRN
  Filled 2013-08-24: qty 2

## 2013-08-24 MED ORDER — LIDOCAINE HCL (PF) 1 % IJ SOLN: 5.0000 mL | INTRAMUSCULAR | Status: DC | PRN

## 2013-08-24 NOTE — Progress Notes (Addendum)
[address]       [place] [address]             [phone]      5 Days Post-Op Procedure(s) (LRB): CORONARY ARTERY BYPASS GRAFTING (CABG) times 5 using left internal mammary artery to LAD, saphenous vein graft to diagonal; sequential saphenous vein graft to intermediate and distal circumflex; and saphenous vein graft to right coronary artery  (N/A) INTRAOPERATIVE TRANSESOPHAGEAL ECHOCARDIOGRAM (N/A) ENDOVEIN HARVEST OF GREATER SAPHENOUS VEIN from left thigh and calf (Left)  Subjective:  [name] [name] has pain at his chest tube site.  Per nursing the patient stepped on his chest tube last night and ever since the patient has been bleeding at his chest tube site.  He has no other complaints.  He is currently receiving dialysis  Objective: Vital signs in last 24 hours: Temp:  [97.9 F (36.6 C)-98.6 F (37 C)] 97.9 F (36.6 C) (08/15 0750) Pulse Rate:  [88-103] 90 (08/15 0900) Cardiac Rhythm:  [-] Normal sinus rhythm;Sinus tachycardia (08/15 0733) Resp:  [17-28] 22 (08/15 0900) BP: (98-124)/(61-83) 109/83 mmHg (08/15 0900) SpO2:  [93 %-100 %] 93 % (08/15 0750) Weight:  [163 lb 9.3 oz (74.2 kg)-170 lb 6.7 oz (77.3 kg)] 170 lb 6.7 oz (77.3 kg) (08/15 0750)  Intake/Output from previous day: 08/14 0701 - 08/15 0700 In: 240 [P.O.:240] Out: 330 [Chest Tube:330]  General appearance: alert, cooperative and no distress Heart: regular rate and rhythm Lungs: clear to auscultation bilaterally Abdomen: soft, non-tender; bowel sounds normal; no masses,  no organomegaly Extremities: edema trace Wound: clean and dry, Chest tube appears to be in place, some dried blood around insertion site  Lab Results:  Recent Labs  08/23/13 0402 08/24/13 0808  WBC 10.4 9.9  HGB 8.7* 7.5*  HCT 26.7* 22.6*  PLT 220 257   BMET:  Recent Labs  08/23/13 0402 08/24/13 0808  NA 142 140  K 4.6 4.6  CL 100 98  CO2 26 26  GLUCOSE 76 91  BUN 19 39*  CREATININE 5.15* 8.04*    CALCIUM 9.8 9.9    PT/INR: No results found for this basename: LABPROT, INR,  in the last 72 hours ABG    Component Value Date/Time   PHART 7.358 08/19/2013 1958   HCO3 23.5 08/19/2013 1958   TCO2 24 08/20/2013 1640   ACIDBASEDEF 2.0 08/19/2013 1958   O2SAT 99.0 08/19/2013 1958   CBG (last 3)   Recent Labs  08/23/13 1631 08/23/13 2130 08/24/13 0618  GLUCAP 99 113* 89    Assessment/Plan: S/P Procedure(s) (LRB): CORONARY ARTERY BYPASS GRAFTING (CABG) times 5 using left internal mammary artery to LAD, saphenous vein graft to diagonal; sequential saphenous vein graft to intermediate and distal circumflex; and saphenous vein graft to right coronary artery  (N/A) INTRAOPERATIVE TRANSESOPHAGEAL ECHOCARDIOGRAM (N/A) ENDOVEIN HARVEST OF GREATER SAPHENOUS VEIN from left thigh and calf (Left)  1. CV- NSR, rate improved, continue Amiodarone, Lopressor 2. Chest tube- no air leak appreciated this morning, CXR not completed due to patient being in dialysis, 330 cc output  3. Pulm- no acute issues, encouraged use of IS 4. Renal- ESRD, HD today, Nephrology following 5. DM- sugars remain well controlled 6. Dispo- patient stable, CXR not yet completed, no air leak appreciated- will leave chest tube as is for now pending CXR, otherwise medically stable continue current care   LOS: 11 days    [name], [name] 08/24/2013  Patient seen and examined. There is no  ongoing bleeding at CT site, dressing has minimal spots of blood Drainage from tube is mostly serous CXR pending

## 2013-08-24 NOTE — Progress Notes (Signed)
[facility] Progress Note   Subjective: had new L chest tube placed for large pneumothorax yesterday, no c/o's today, on HD  Filed Vitals:   08/24/13 0900 08/24/13 0930 08/24/13 0958 08/24/13 1030  BP: 109/83 102/62 99/69 97/68   Pulse: 90 97 103 104  Temp:      TempSrc:      Resp: 22 20 22 20   Height:      Weight:      SpO2:       Exam: Alert, no distress, calm  No jvd Chest faint basilar rales RRR 1/6 SEM no RG Abd soft, NTND Mild edema LE's Neuro is alert and Ox 3 Left arm AVF +bruit  CXR 8/13 edema resolved  HD: [facility] 4h   F180   400/800   2/2.0 Bath   73kg  Heparin 4000   L arm AVF Calcitriol 0.5 ug TIW, Venofer 50 mg q Thursday  pth 1000, P 5.6  tsat 16%  Hb 12     Assessment: 1 NSTEMI / CAD- s/p CABG 8/11 2 ESRD on HD 3 HTN/ vol - up 4kg by wt, BP's soft 4 Anemia started aranesp 25 ug/wk, Hb 7.5 5 HPTH holding vit D for ^Ca, cont binders (renvela 2ac) and 2Ca bath 6 DM per primary   Plan- HD today, reduce metoprolol by 50%, watch Hb, transfuse prn    Kelly Splinter MD  pager [phone]    cell [phone]  08/24/2013, 10:34 AM     Recent Labs Lab 08/18/13 0248  08/22/13 0500 08/23/13 0402 08/24/13 0808  NA 137  < > 141 142 140  K 4.3  < > 4.2 4.6 4.6  CL 93*  < > 100 100 98  CO2 28  < > 27 26 26   GLUCOSE 121*  < > 87 76 91  BUN 25*  < > 19 19 39*  CREATININE 6.46*  < > 5.11* 5.15* 8.04*  CALCIUM 10.2  < > 9.3 9.8 9.9  PHOS 5.4*  --   --   --  5.7*  < > = values in this interval not displayed.  Recent Labs Lab 08/18/13 0248 08/19/13 0245 08/24/13 0808  AST  --  10  --   ALT  --  7  --   ALKPHOS  --  84  --   BILITOT  --  0.3  --   PROT  --  6.6  --   ALBUMIN 3.6 3.3* 2.6*    Recent Labs Lab 08/22/13 0500 08/23/13 0402 08/24/13 0808  WBC 10.6* 10.4 9.9  HGB 8.0* 8.7* 7.5*  HCT 24.2* 26.7* 22.6*  MCV 94.9 96.4 93.8  PLT 160 220 257   . amiodarone  200 mg Oral Q12H   Followed by  . [START ON 08/29/2013]  amiodarone  200 mg Oral Daily  . antiseptic oral rinse  7 mL Mouth Rinse BID  . aspirin EC  325 mg Oral Daily  . atorvastatin  20 mg Oral q1800  . darbepoetin (ARANESP) injection - DIALYSIS  25 mcg Intravenous Q Thu-HD  . docusate sodium  200 mg Oral Daily  . enoxaparin (LOVENOX) injection  30 mg Subcutaneous Q24H  . glipiZIDE  2.5 mg Oral Q breakfast  . insulin aspart  0-24 Units Subcutaneous TID AC & HS  . levalbuterol  0.63 mg Nebulization TID  . metoprolol tartrate  25 mg Oral BID  . pantoprazole  40 mg Oral QAC breakfast  . sodium chloride  3 mL  Intravenous Q12H  . tamsulosin  0.4 mg Oral QHS  . traMADol         sodium chloride, sodium chloride, sodium chloride, alteplase, bisacodyl, bisacodyl, feeding supplement (NEPRO CARB STEADY), guaiFENesin, heparin, [START ON 08/25/2013] heparin, lidocaine (PF), lidocaine-prilocaine, oxyCODONE, pentafluoroprop-tetrafluoroeth, sodium chloride, traMADol

## 2013-08-24 NOTE — Progress Notes (Signed)
Called Dr. Roxan Hockey to make aware of pt rate/rhythm.  CCMD called this morning while in HD that was in/out of atrial flutter.  PO amiodarone given when he returned. Pt resting with call bell within reach.  Will continue to monitor. ,mec

## 2013-08-24 NOTE — Progress Notes (Signed)
CARDIAC REHAB PHASE I   PRE:  Rate/Rhythm: 105 ST  BP:  Supine:   Sitting: 92/53  Standing:    SaO2: 97 4L  MODE:  Ambulation: 100 ft   POST:  Rate/Rhythm: 122 ST  BP:  Supine:   Sitting: 112/60  Standing:    SaO2: 94 4L Just back from hemodialysis, but willing to walk.  Still weak, but balance steady with rolling walker, 4 L of oxygen, chest tube x1 with assistance x 1.  Encouraged to walk with staff again this evening and 3 times tomorrow. [phone]  Liliane Channel RN, BSN 08/24/2013 2:09 PM

## 2013-08-25 ENCOUNTER — Inpatient Hospital Stay (HOSPITAL_COMMUNITY): Payer: Medicare Other

## 2013-08-25 LAB — GLUCOSE, CAPILLARY
GLUCOSE-CAPILLARY: 115 mg/dL — AB (ref 70–99)
Glucose-Capillary: 94 mg/dL (ref 70–99)
Glucose-Capillary: 94 mg/dL (ref 70–99)
Glucose-Capillary: 93 mg/dL (ref 70–99)

## 2013-08-25 LAB — CBC
HCT: 27.1 % — ABNORMAL LOW (ref 39.0–52.0)
Hemoglobin: 8.9 g/dL — ABNORMAL LOW (ref 13.0–17.0)
MCH: 31.7 pg (ref 26.0–34.0)
MCHC: 32.8 g/dL (ref 30.0–36.0)
MCV: 96.4 fL (ref 78.0–100.0)
Platelets: 318 10*3/uL (ref 150–400)
RBC: 2.81 MIL/uL — ABNORMAL LOW (ref 4.22–5.81)
RDW: 15.7 % — AB (ref 11.5–15.5)
WBC: 8.4 10*3/uL (ref 4.0–10.5)

## 2013-08-25 MED ORDER — LEVALBUTEROL HCL 0.63 MG/3ML IN NEBU: 0.6300 mg | INHALATION_SOLUTION | Freq: Four times a day (QID) | RESPIRATORY_TRACT | Status: DC | PRN

## 2013-08-25 NOTE — Progress Notes (Signed)
  [name] [name] Progress Note   Subjective: no new complaints, had aflutter in and out during HD yesterday. NSR today  Filed Vitals:   08/25/13 1115 08/25/13 1130 08/25/13 1200 08/25/13 1240  BP: 113/55 106/62 98/70   Pulse: 78 79 80   Temp:      TempSrc:      Resp:      Height:      Weight:      SpO2:    96%   Exam: Alert, no distress, calm  No jvd Chest is clear bilat RRR 1/6 SEM no RG Abd soft, NTND Mild edema LE's Neuro is alert and Ox 3 Left arm AVF +bruit  CXR 8/16 no acute, no edema, 15% PTX  HD: [facility]   F180   400/800   2/2.0 Bath   73kg  Heparin 4000   L arm AVF Calcitriol 0.5 ug TIW, Venofer 50 mg q Thursday  pth 1000, P 5.6  tsat 16%  Hb 12     Assessment: 1 NSTEMI / CAD- s/p CABG 8/11 2 ESRD on HD TTS 3 HTN/ vol - just over dry wt, stable 4 Anemia started aranesp 25 ug/wk, Hb 7.5 yest 5 HPTH holding vit D for ^Ca, cont binders (renvela 2ac) and 2Ca bath 6 DM per primary 7 Aflutter - back in NSR, on amio and MTP 8 Hypotension- avoid excess UF next HD   Plan- will follow, recheck Hb, transfuse if worse    Kelly Splinter MD  pager [phone]    cell [phone]  08/25/2013, 1:07 PM     Recent Labs Lab 08/22/13 0500 08/23/13 0402 08/24/13 0808  NA 141 142 140  K 4.2 4.6 4.6  CL 100 100 98  CO2 27 26 26   GLUCOSE 87 76 91  BUN 19 19 39*  CREATININE 5.11* 5.15* 8.04*  CALCIUM 9.3 9.8 9.9  PHOS  --   --  5.7*    Recent Labs Lab 08/19/13 0245 08/24/13 0808  AST 10  --   ALT 7  --   ALKPHOS 84  --   BILITOT 0.3  --   PROT 6.6  --   ALBUMIN 3.3* 2.6*    Recent Labs Lab 08/22/13 0500 08/23/13 0402 08/24/13 0808  WBC 10.6* 10.4 9.9  HGB 8.0* 8.7* 7.5*  HCT 24.2* 26.7* 22.6*  MCV 94.9 96.4 93.8  PLT 160 220 257   . amiodarone  200 mg Oral Q12H   Followed by  . [START ON 08/29/2013] amiodarone  200 mg Oral Daily  . antiseptic oral rinse  7 mL Mouth Rinse BID  . aspirin EC  325 mg Oral Daily  . atorvastatin  20  mg Oral q1800  . darbepoetin (ARANESP) injection - DIALYSIS  25 mcg Intravenous Q Thu-HD  . docusate sodium  200 mg Oral Daily  . enoxaparin (LOVENOX) injection  30 mg Subcutaneous Q24H  . [START ON 08/29/2013] ferric gluconate (FERRLECIT/NULECIT) IV  62.5 mg Intravenous Q Thu-HD  . glipiZIDE  2.5 mg Oral Q breakfast  . insulin aspart  0-24 Units Subcutaneous TID AC & HS  . metoprolol tartrate  12.5 mg Oral BID  . pantoprazole  40 mg Oral QAC breakfast  . sevelamer carbonate  1,600 mg Oral TID WC  . sodium chloride  3 mL Intravenous Q12H  . tamsulosin  0.4 mg Oral QHS     sodium chloride, bisacodyl, bisacodyl, guaiFENesin, levalbuterol, oxyCODONE, sodium chloride, traMADol

## 2013-08-25 NOTE — Progress Notes (Signed)
Removed EPW per MD order per hospital policy. Pacing wires intact upon removal. Patient tolerated well, VSS. Patient reminded to remain in bed for 1 hour. Will continue to monitor closely. Glade Nurse, RN

## 2013-08-25 NOTE — Progress Notes (Signed)
Pt. 02 saturation on room air at rest at 96%. O2 removed at rest, will reevaluate need for 02 when ambulated prior to dinner.  Wardell Honour, RN

## 2013-08-25 NOTE — Progress Notes (Addendum)
[address]       [place], [address]             [phone]      6 Days Post-Op Procedure(s) (LRB): CORONARY ARTERY BYPASS GRAFTING (CABG) times 5 using left internal mammary artery to LAD, saphenous vein graft to diagonal; sequential saphenous vein graft to intermediate and distal circumflex; and saphenous vein graft to right coronary artery  (N/A) INTRAOPERATIVE TRANSESOPHAGEAL ECHOCARDIOGRAM (N/A) ENDOVEIN HARVEST OF GREATER SAPHENOUS VEIN from left thigh and calf (Left)  Subjective:  [name] [name] has no complaints this morning.  He states he is feeling much better than he did yesterday during dialysis.  He does have an increase in his pneumothorax this morning and patient states his chest tube may have been disconnected when CXR was taken.  + ambulation, +BM  Objective: Vital signs in last 24 hours: Temp:  [97.5 F (36.4 C)-98.5 F (36.9 C)] 98.5 F (36.9 C) (08/16 0401) Pulse Rate:  [86-104] 86 (08/16 0401) Cardiac Rhythm:  [-] Normal sinus rhythm (08/16 0730) Resp:  [14-22] 18 (08/15 1935) BP: (87-112)/(58-69) 112/68 mmHg (08/16 0401) SpO2:  [91 %-100 %] 96 % (08/16 0730) Weight:  [163 lb 2.3 oz (74 kg)-169 lb 5 oz (76.8 kg)] 163 lb 2.3 oz (74 kg) (08/16 0404)  Intake/Output from previous day: 08/15 0701 - 08/16 0700 In: -  Out: 1140 [Chest Tube:90] Intake/Output this shift: Total I/O In: 240 [P.O.:240] Out: 0   General appearance: alert, cooperative and no distress Heart: regular rate and rhythm Lungs: clear to auscultation bilaterally Abdomen: soft, non-tender; bowel sounds normal; no masses,  no organomegaly Extremities: edema trace Wound: clean and dry  Lab Results:  Recent Labs  08/23/13 0402 08/24/13 0808  WBC 10.4 9.9  HGB 8.7* 7.5*  HCT 26.7* 22.6*  PLT 220 257   BMET:  Recent Labs  08/23/13 0402 08/24/13 0808  NA 142 140  K 4.6 4.6  CL 100 98  CO2 26 26  GLUCOSE 76 91  BUN 19 39*  CREATININE 5.15* 8.04*  CALCIUM  9.8 9.9    PT/INR: No results found for this basename: LABPROT, INR,  in the last 72 hours ABG    Component Value Date/Time   PHART 7.358 08/19/2013 1958   HCO3 23.5 08/19/2013 1958   TCO2 24 08/20/2013 1640   ACIDBASEDEF 2.0 08/19/2013 1958   O2SAT 99.0 08/19/2013 1958   CBG (last 3)   Recent Labs  08/24/13 1609 08/24/13 2112 08/25/13 0621  GLUCAP 99 112* 94    Assessment/Plan: S/P Procedure(s) (LRB): CORONARY ARTERY BYPASS GRAFTING (CABG) times 5 using left internal mammary artery to LAD, saphenous vein graft to diagonal; sequential saphenous vein graft to intermediate and distal circumflex; and saphenous vein graft to right coronary artery  (N/A) INTRAOPERATIVE TRANSESOPHAGEAL ECHOCARDIOGRAM (N/A) ENDOVEIN HARVEST OF GREATER SAPHENOUS VEIN from left thigh and calf (Left)  1. CV- NSR continue Amiodarone, Lopressor 2. Chest tube- intermittent single air bubble air leak, CXR with increase in pneumothorax this morning- will leave chest tube to suction today 3. Pulm- wean oxygen as tolerated, encourage IS 4. Renal- ESRD, Nephrology following 5. DM- sugars well controlled 6. Dispo- patient stable, CXR shows increase in Pneumothorax, will leave on suction today, d/c EPW if still in place   LOS: 12 days    [name], [name] 08/25/2013  Patient seen and examined, agree with above His CXR this AM showed a significant pneumo. I suspect tube was kinked.  He has an intermittent air leak. Keep CT to suction

## 2013-08-26 ENCOUNTER — Inpatient Hospital Stay (HOSPITAL_COMMUNITY): Payer: Medicare Other

## 2013-08-26 LAB — CBC
HEMATOCRIT: 22.8 % — AB (ref 39.0–52.0)
Hemoglobin: 7.6 g/dL — ABNORMAL LOW (ref 13.0–17.0)
MCH: 31.3 pg (ref 26.0–34.0)
MCHC: 33.3 g/dL (ref 30.0–36.0)
MCV: 93.8 fL (ref 78.0–100.0)
Platelets: 333 10*3/uL (ref 150–400)
RBC: 2.43 MIL/uL — AB (ref 4.22–5.81)
RDW: 15.4 % (ref 11.5–15.5)
WBC: 9.1 10*3/uL (ref 4.0–10.5)

## 2013-08-26 LAB — RENAL FUNCTION PANEL
ANION GAP: 14 (ref 5–15)
Albumin: 2.5 g/dL — ABNORMAL LOW (ref 3.5–5.2)
BUN: 38 mg/dL — AB (ref 6–23)
CO2: 30 meq/L (ref 19–32)
Calcium: 9.7 mg/dL (ref 8.4–10.5)
Chloride: 94 mEq/L — ABNORMAL LOW (ref 96–112)
Creatinine, Ser: 7.62 mg/dL — ABNORMAL HIGH (ref 0.50–1.35)
GFR calc non Af Amer: 7 mL/min — ABNORMAL LOW (ref >90)
GFR, EST AFRICAN AMERICAN: 8 mL/min — AB (ref >90)
GLUCOSE: 97 mg/dL (ref 70–99)
PHOSPHORUS: 4.9 mg/dL — AB (ref 2.3–4.6)
POTASSIUM: 3.7 meq/L (ref 3.7–5.3)
SODIUM: 138 meq/L (ref 137–147)

## 2013-08-26 LAB — GLUCOSE, CAPILLARY
GLUCOSE-CAPILLARY: 80 mg/dL (ref 70–99)
Glucose-Capillary: 94 mg/dL (ref 70–99)
Glucose-Capillary: 74 mg/dL (ref 70–99)
Glucose-Capillary: 87 mg/dL (ref 70–99)

## 2013-08-26 NOTE — Progress Notes (Signed)
Patient in A. Flutter. EKG done. MD notified. No new orders at this time. Will continue to monitor.

## 2013-08-26 NOTE — Progress Notes (Signed)
0915 Holding ambulation per RN since pt not to be taken off suction today. Will continue to follow. Graylon Good RN BSN 08/26/2013 9:17 AM

## 2013-08-26 NOTE — Progress Notes (Signed)
  [facility] Progress Note   Subjective: no new issues; no c/o this AM  Filed Vitals:   08/25/13 1200 08/25/13 1240 08/25/13 2017 08/26/13 0546  BP: 98/70  119/48 110/58  Pulse: 80  80 100  Temp:   98.2 F (36.8 C) 98.3 F (36.8 C)  TempSrc:   Oral Oral  Resp:   18 18  Height:      Weight:    74 kg (163 lb 2.3 oz)  SpO2:  96% 92% 93%   Exam: Alert, no distress, calm  Chest is clear bilat RRR  Abd soft, NTND Mild edema LE's Neuro is alert and Ox 3 Left arm AVF +bruit  CXR 8/16 no acute, no edema, 15% PTX  HD: [facility] 4h   F180   400/800   2/2.0 Bath   73kg  Heparin 4000   L arm AVF Calcitriol 0.5 ug TIW, Venofer 50 mg q Thursday  pth 1000, P 5.6  tsat 16%  Hb 12     Assessment: 1 NSTEMI / CAD- s/p CABG 8/11 2 ESRD on HD TTS 3 HTN/ vol - just over dry wt, stable,  4 Anemia started aranesp 25 ug/wk, Stable with some lability, monitor 5 HPTH holding vit D for ^Ca, cont binders (renvela 2ac) and 2Ca bath 6 DM per primary 7 Aflutter - back in NSR, on amio and MTP 8 Hypotension- avoid excess UF next HD   Plan- Next HD tomorrow.     Kelly Splinter MD  pager [phone]    cell [phone]  08/26/2013, 9:11 AM     Recent Labs Lab 08/23/13 0402 08/24/13 0808 08/26/13 0047  NA 142 140 138  K 4.6 4.6 3.7  CL 100 98 94*  CO2 26 26 30   GLUCOSE 76 91 97  BUN 19 39* 38*  CREATININE 5.15* 8.04* 7.62*  CALCIUM 9.8 9.9 9.7  PHOS  --  5.7* 4.9*    Recent Labs Lab 08/24/13 0808 08/26/13 0047  ALBUMIN 2.6* 2.5*    Recent Labs Lab 08/24/13 0808 08/25/13 1338 08/26/13 0047  WBC 9.9 8.4 9.1  HGB 7.5* 8.9* 7.6*  HCT 22.6* 27.1* 22.8*  MCV 93.8 96.4 93.8  PLT 257 318 333   . amiodarone  200 mg Oral Q12H   Followed by  . [START ON 08/29/2013] amiodarone  200 mg Oral Daily  . antiseptic oral rinse  7 mL Mouth Rinse BID  . aspirin EC  325 mg Oral Daily  . atorvastatin  20 mg Oral q1800  . darbepoetin (ARANESP) injection - DIALYSIS  25 mcg  Intravenous Q Thu-HD  . docusate sodium  200 mg Oral Daily  . enoxaparin (LOVENOX) injection  30 mg Subcutaneous Q24H  . [START ON 08/29/2013] ferric gluconate (FERRLECIT/NULECIT) IV  62.5 mg Intravenous Q Thu-HD  . glipiZIDE  2.5 mg Oral Q breakfast  . insulin aspart  0-24 Units Subcutaneous TID AC & HS  . metoprolol tartrate  12.5 mg Oral BID  . pantoprazole  40 mg Oral QAC breakfast  . sevelamer carbonate  1,600 mg Oral TID WC  . sodium chloride  3 mL Intravenous Q12H  . tamsulosin  0.4 mg Oral QHS     sodium chloride, bisacodyl, bisacodyl, guaiFENesin, levalbuterol, oxyCODONE, sodium chloride, traMADol

## 2013-08-26 NOTE — Progress Notes (Signed)
Patient Name: [name] [name] Date of Encounter: 08/26/2013  Principal Problem:   NSTEMI (non-ST elevated myocardial infarction) Active Problems:   ESRD on dialysis   Anemia   Systolic and diastolic CHF, acute on chronic   Sustained VT (ventricular tachycardia)   S/P CABG x 5    Patient Profile: 68 yo male w/ hx DM, ESRD on HD, HTN, S/D-CHF, mod MR, was admitted 08/04 with NSTEMI, had CABG x 5 on 08/10. Post-op PTX, still w/ chest tube, pacing wires d/c'd 08/16.   SUBJECTIVE: Pt not aware of any fluttering, feels he is making slow progress.   OBJECTIVE Filed Vitals:   08/25/13 1200 08/25/13 1240 08/25/13 2017 08/26/13 0546  BP: 98/70  119/48 110/58  Pulse: 80  80 100  Temp:   98.2 F (36.8 C) 98.3 F (36.8 C)  TempSrc:   Oral Oral  Resp:   18 18  Height:      Weight:    163 lb 2.3 oz (74 kg)  SpO2:  96% 92% 93%    Intake/Output Summary (Last 24 hours) at 08/26/13 0833 Last data filed at 08/25/13 1630  Gross per 24 hour  Intake    583 ml  Output     50 ml  Net    533 ml   Filed Weights   08/24/13 1150 08/25/13 0404 08/26/13 0546  Weight: 169 lb 5 oz (76.8 kg) 163 lb 2.3 oz (74 kg) 163 lb 2.3 oz (74 kg)    PHYSICAL EXAM General: Well developed, well nourished, male in no acute distress. Head: Normocephalic, atraumatic.  Neck: Supple without bruits, JVD not elevated. Lungs:  Resp regular and unlabored, decreased BS bases. Chest tube on left Heart: slightly irregular, S1, S2, no S3, S4, or murmur; no rub. Abdomen: Soft, non-tender, non-distended, BS + x 4.  Extremities: No clubbing, cyanosis, trace edema.  Neuro: Alert and oriented X 3. Moves all extremities spontaneously. Psych: Normal affect.  LABS: CBC: Recent Labs  08/25/13 1338 08/26/13 0047  WBC 8.4 9.1  HGB 8.9* 7.6*  HCT 27.1* 22.8*  MCV 96.4 93.8  PLT 318 782   Basic Metabolic Panel: Recent Labs  08/24/13 0808 08/26/13 0047  NA 140 138  K 4.6 3.7  CL 98 94*  CO2 26 30  GLUCOSE 91  97  BUN 39* 38*  CREATININE 8.04* 7.62*  CALCIUM 9.9 9.7  PHOS 5.7* 4.9*   Liver Function Tests: Recent Labs  08/24/13 0808 08/26/13 0047  ALBUMIN 2.6* 2.5*   TELE:  Went into atrial flutter 08/16.   Radiology/Studies: Dg Chest Port 1 View 08/26/2013   CLINICAL DATA:  Left pneumothorax.  EXAM: PORTABLE CHEST - 1 VIEW  COMPARISON:  08/25/2013  FINDINGS: Left chest tube remains in place. There has been a significant decrease in the left pneumothorax. There is still a small left apical pneumothorax.  Persistent prominence of the cardiac silhouette. Pulmonary vascularity is normal. No infiltrates or effusions.  IMPRESSION: Decrease in the left pneumothorax to approximately 5% since the prior study. No other change.   Electronically Signed   By: Rozetta Nunnery M.D.   On: 08/26/2013 08:07 lly Signed   By: Suzy Bouchard M.D.   On: 08/24/2013 15:52   Current Medications:  . amiodarone  200 mg Oral Q12H   Followed by  . [START ON 08/29/2013] amiodarone  200 mg Oral Daily  . antiseptic oral rinse  7 mL Mouth Rinse BID  . aspirin EC  325 mg  Oral Daily  . atorvastatin  20 mg Oral q1800  . darbepoetin (ARANESP) injection - DIALYSIS  25 mcg Intravenous Q Thu-HD  . docusate sodium  200 mg Oral Daily  . enoxaparin (LOVENOX) injection  30 mg Subcutaneous Q24H  . [START ON 08/29/2013] ferric gluconate (FERRLECIT/NULECIT) IV  62.5 mg Intravenous Q Thu-HD  . glipiZIDE  2.5 mg Oral Q breakfast  . insulin aspart  0-24 Units Subcutaneous TID AC & HS  . metoprolol tartrate  12.5 mg Oral BID  . pantoprazole  40 mg Oral QAC breakfast  . sevelamer carbonate  1,600 mg Oral TID WC  . sodium chloride  3 mL Intravenous Q12H  . tamsulosin  0.4 mg Oral QHS      ASSESSMENT AND PLAN: Principal Problem:   NSTEMI (non-ST elevated myocardial infarction) - had CABG, on ASA 325 mg, statin, BB. EF 40%  Active Problems:   Atrial flutter - has had at times since admission. On amio 200 mg BID and low-dose Lopressor.  HR generally well-controlled. Pt not aware of arrhythmia. MD advise on increasing Amiodarone to 400 mg BID.     Anticoagulation - CHADs2VASC score is 4, he would be anticoagulation candidate, MD advise on Rx, currently on DVT Lovenox and ASA 325 mg.    ESRD on dialysis - per Neprhology    Anemia - per TCTS, Nephrology    Systolic and diastolic CHF, acute on chronic - volume mgt per Nephrology    Sustained VT (ventricular tachycardia) - none seen in last 24 hr.    S/P CABG x 5 - POD 7, per TCTS    Left PTX - per TCTS, improving.  Signed, Rosaria Ferries , PA-C 8:33 AM 08/26/2013  Consider increasing Amiodarone to 400 mg BID after anticoagulation is started.  He appears to be in rate controlled atrial flutter.  WOuld start IV heparin and Coumadin when safe from a surgical standpoint (chest tube still in place).   If he does not convert to NSR with Amio, would plan for DCCV after 4 weeks of therapeutic anticoagulation.  Continue current dose of Amio at this point.  Hopefully, he will convert to NSR in teh next few days without DCCV.

## 2013-08-26 NOTE — Evaluation (Signed)
Physical Therapy Evaluation Patient Details Name: [name] [name] MRN: [id number] DOB: May 05, 1945 Today's Date: 08/26/2013   History of Present Illness  Pt is 68 yo male with DM, ESRD on HD since 4/15, presents with CP and underwent CABG followed by pneumothorax, now with left chest tube.  Clinical Impression  Pt mobilizing well on eval though limited by chest tube suction, fatigues quickly with mobility. Pt lives alone and has to go to HD, because of this, recommend SNF for rehab before home.Pt will benefit from skilled PT to address problem list below and maximize independence.   PT will continue to follow.     Follow Up Recommendations SNF;Supervision/Assistance - 24 hour    Equipment Recommendations  Rolling walker with 5" wheels    Recommendations for Other Services       Precautions / Restrictions Precautions Precautions: Sternal Precaution Comments: reviewed sternal precautions Restrictions Weight Bearing Restrictions: No Other Position/Activity Restrictions: pt on suction      Mobility  Bed Mobility               General bed mobility comments: pt received in chair, verbalized bed mobility with sternal precautions and pt voiced understanding but will need to practice  Transfers Overall transfer level: Needs assistance Equipment used: Rolling walker (2 wheeled) Transfers: Sit to/from Stand Sit to Stand: Supervision         General transfer comment: supervision for safety and vc's for hand placement with RW  Ambulation/Gait Ambulation/Gait assistance: Supervision Ambulation Distance (Feet): 45 Feet Assistive device: Rolling walker (2 wheeled) Gait Pattern/deviations: Step-through pattern;Decreased stride length Gait velocity: WFL to begin, decreasing with distance   General Gait Details: pt fatigues quickly  Stairs            Wheelchair Mobility    Modified Rankin (Stroke Patients Only)       Balance Overall balance assessment: Needs  assistance Sitting-balance support: Feet supported;No upper extremity supported Sitting balance-Leahy Scale: Normal     Standing balance support: Bilateral upper extremity supported;During functional activity Standing balance-Leahy Scale: Fair Standing balance comment: pt feels more secure with RW or support, did not use PTA                             Pertinent Vitals/Pain Pain Assessment: No/denies pain    Home Living Family/patient expects to be discharged to:: Private residence Living Arrangements: Alone Available Help at Discharge: Family;Available PRN/intermittently Type of Home: Apartment Home Access: Elevator     Home Layout: One level Home Equipment: Crutches Additional Comments: sister takes pt to grocery store, could take him to HD if needed but he usually walks or takes bus.     Prior Function Level of Independence: Independent         Comments: pt has crutches from gout flare     Hand Dominance        Extremity/Trunk Assessment   Upper Extremity Assessment: Overall WFL for tasks assessed           Lower Extremity Assessment: Overall WFL for tasks assessed      Cervical / Trunk Assessment: Normal  Communication   Communication: No difficulties  Cognition Arousal/Alertness: Awake/alert Behavior During Therapy: WFL for tasks assessed/performed Overall Cognitive Status: Within Functional Limits for tasks assessed                      General Comments      Exercises General Exercises -  Lower Extremity Ankle Circles/Pumps: AROM;10 reps;Seated Long Arc Quad: AROM;10 reps;Seated      Assessment/Plan    PT Assessment Patient needs continued PT services  PT Diagnosis Difficulty walking   PT Problem List Decreased activity tolerance;Decreased mobility;Decreased knowledge of use of DME;Decreased knowledge of precautions;Decreased balance  PT Treatment Interventions DME instruction;Gait training;Stair training;Functional  mobility training;Therapeutic activities;Therapeutic exercise;Balance training;Patient/family education   PT Goals (Current goals can be found in the Care Plan section) Acute Rehab PT Goals Patient Stated Goal: return home PT Goal Formulation: With patient Time For Goal Achievement: 09/09/13 Potential to Achieve Goals: Good    Frequency Min 3X/week   Barriers to discharge Decreased caregiver support no 24 hr supervision available    Co-evaluation               End of Session Equipment Utilized During Treatment: Gait belt;Other (comment) (chest tube) Activity Tolerance: Patient tolerated treatment well Patient left: in chair;with call bell/phone within reach Nurse Communication: Mobility status         Time: 1351-1415 PT Time Calculation (min): 24 min   Charges:   PT Evaluation $Initial PT Evaluation Tier I: 1 Procedure PT Treatments $Gait Training: 8-22 mins $Therapeutic Activity: 8-22 mins   PT G Codes:        Leighton Roach, PT  Acute Rehab Services  [place], [place] 08/26/2013, 2:52 PM

## 2013-08-26 NOTE — Progress Notes (Addendum)
      [address]       [place] [address]             [phone]      7 Days Post-Op Procedure(s) (LRB): CORONARY ARTERY BYPASS GRAFTING (CABG) times 5 using left internal mammary artery to LAD, saphenous vein graft to diagonal; sequential saphenous vein graft to intermediate and distal circumflex; and saphenous vein graft to right coronary artery  (N/A) INTRAOPERATIVE TRANSESOPHAGEAL ECHOCARDIOGRAM (N/A) ENDOVEIN HARVEST OF GREATER SAPHENOUS VEIN from left thigh and calf (Left)  Subjective:  [name] [name] has no complaints this morning.    Objective: Vital signs in last 24 hours: Temp:  [97.6 F (36.4 C)-98.3 F (36.8 C)] 98.3 F (36.8 C) (08/17 0546) Pulse Rate:  [78-100] 100 (08/17 0546) Cardiac Rhythm:  [-]  Resp:  [18] 18 (08/17 0546) BP: (98-127)/(48-70) 110/58 mmHg (08/17 0546) SpO2:  [92 %-96 %] 93 % (08/17 0546) Weight:  [163 lb 2.3 oz (74 kg)] 163 lb 2.3 oz (74 kg) (08/17 0546)  Intake/Output from previous day: 08/16 0701 - 08/17 0700 In: 823 [P.O.:820; I.V.:3] Out: 50 [Urine:50]  General appearance: alert, cooperative and no distress Heart: irregularly irregular rhythm Lungs: clear to auscultation bilaterally Abdomen: soft, non-tender; bowel sounds normal; no masses,  no organomegaly Extremities: edema trace Wound: clean and dry  Lab Results:  Recent Labs  08/25/13 1338 08/26/13 0047  WBC 8.4 9.1  HGB 8.9* 7.6*  HCT 27.1* 22.8*  PLT 318 333   BMET:  Recent Labs  08/24/13 0808 08/26/13 0047  NA 140 138  K 4.6 3.7  CL 98 94*  CO2 26 30  GLUCOSE 91 97  BUN 39* 38*  CREATININE 8.04* 7.62*  CALCIUM 9.9 9.7    PT/INR: No results found for this basename: LABPROT, INR,  in the last 72 hours ABG    Component Value Date/Time   PHART 7.358 08/19/2013 1958   HCO3 23.5 08/19/2013 1958   TCO2 24 08/20/2013 1640   ACIDBASEDEF 2.0 08/19/2013 1958   O2SAT 99.0 08/19/2013 1958   CBG (last 3)   Recent Labs  08/25/13 1610  08/25/13 2050 08/26/13 0559  GLUCAP 94 93 80    Assessment/Plan: S/P Procedure(s) (LRB): CORONARY ARTERY BYPASS GRAFTING (CABG) times 5 using left internal mammary artery to LAD, saphenous vein graft to diagonal; sequential saphenous vein graft to intermediate and distal circumflex; and saphenous vein graft to right coronary artery  (N/A) INTRAOPERATIVE TRANSESOPHAGEAL ECHOCARDIOGRAM (N/A) ENDOVEIN HARVEST OF GREATER SAPHENOUS VEIN from left thigh and calf (Left)  1. CV- A. Flutter rate in the 100s- on Amiodarone, Lopressor 2. Pulm- Pneumothorax is improved on CXR, no air leak appreciated this morning- will leave chest tube to suction 3. Renal- ESRD, Nephrology following 4. DM- sugars remain well controlled 5. Dispo- patient with A. Flutter overnight rate in the 100s, CXR shows improvement of pneumothorax no air leak appreciated this morning, will leave chest tube on suction   LOS: 13 days    [name], [name] 08/26/2013  Now back in sinus was in flutter , with chest tube still in place will not start full dose heparin  I have seen and examined [name] [name] and agree with the above assessment  and plan.  [name] MD Beeper [phone] Office [phone] 08/26/2013 3:00 PM

## 2013-08-26 NOTE — Care Management Note (Signed)
    Page 1 of 2   09/06/2013     4:15:27 PM CARE MANAGEMENT NOTE 09/06/2013  Patient:  [name] [name],[name] [name]   Account Number:  [id number]  Date Initiated:  08/13/2013  Documentation initiated by:  [facility]  Subjective/Objective Assessment:   NSTEMI  s/p CABG x 5 on 08/19/13     Action/Plan:   Pt lives alone; will need SNF upon dc.   Anticipated DC Date:  09/06/2013   Anticipated DC Plan:  SKILLED NURSING FACILITY  In-house referral  Clinical Social Worker      DC Planning Services  CM consult      Choice offered to / List presented to:             Status of service:  Completed, signed off Medicare Important Message given?  YES (If response is "NO", the following Medicare IM given date fields will be blank) Date Medicare IM given:  08/23/2013 Medicare IM given by:  [facility] Date Additional Medicare IM given:  09/05/2013 Additional Medicare IM given by:  Mikah Poss  Discharge Disposition:  [place]  Per UR Regulation:  Reviewed for med. necessity/level of care/duration of stay  If discussed at [place] of Stay Meetings, dates discussed:   08/27/2013  08/29/2013  09/03/2013  09/05/2013    Comments:  09/05/13 Ellan Lambert, RN, BSN [phone] Mini express CT placed yesterday; plan for HD today and dc to SNF on 8/28.  08/30/13 Ellan Lambert, RN, BSN [phone] Pt with cont CT to H20 seal with air leak.  CSW cont to follow for dc to SNF when medically stable for dc.  Will follow progress.  08/26/13 Ellan Lambert, RN, BSN [phone] Met with pt and family today; they are requesting SNF at dc, as recommended by PT eval.  Notified CSW to facilitate dc to SNF for rehab when medically stable for dc.  08/23/2013 1430 NCM spoke to pt and states he does not have 24 hour assistance at home. He is going to speak to his family before making decision. NCM discussed short term rehab, SNF. States he is unsure at this time. Jonnie Finner RN CCM Case Mgmt phone  [phone]  08-15-13 11am Luz Lex, RNBSN [phone] Spoke with patient - plan for cardiac surgery next week. Lives at home alone, independent.  Informed that he will need someone with him 24/7 on discharge home.  States that he will be able to do that.  CM informed him CM will be following up with him on plan. Contact:  Salts,[name] Sister [phone]   [phone]

## 2013-08-27 ENCOUNTER — Inpatient Hospital Stay (HOSPITAL_COMMUNITY): Payer: Medicare Other

## 2013-08-27 LAB — RENAL FUNCTION PANEL
Albumin: 2.7 g/dL — ABNORMAL LOW (ref 3.5–5.2)
Anion gap: 16 — ABNORMAL HIGH (ref 5–15)
BUN: 56 mg/dL — AB (ref 6–23)
CHLORIDE: 90 meq/L — AB (ref 96–112)
CO2: 29 mEq/L (ref 19–32)
Calcium: 9.5 mg/dL (ref 8.4–10.5)
Creatinine, Ser: 11.08 mg/dL — ABNORMAL HIGH (ref 0.50–1.35)
GFR calc Af Amer: 5 mL/min — ABNORMAL LOW (ref >90)
GFR, EST NON AFRICAN AMERICAN: 4 mL/min — AB (ref >90)
Glucose, Bld: 120 mg/dL — ABNORMAL HIGH (ref 70–99)
POTASSIUM: 4.1 meq/L (ref 3.7–5.3)
Phosphorus: 5.9 mg/dL — ABNORMAL HIGH (ref 2.3–4.6)
Sodium: 135 mEq/L — ABNORMAL LOW (ref 137–147)

## 2013-08-27 LAB — GLUCOSE, CAPILLARY
GLUCOSE-CAPILLARY: 65 mg/dL — AB (ref 70–99)
GLUCOSE-CAPILLARY: 30 mg/dL — AB (ref 70–99)
GLUCOSE-CAPILLARY: 74 mg/dL (ref 70–99)
GLUCOSE-CAPILLARY: 97 mg/dL (ref 70–99)
Glucose-Capillary: 50 mg/dL — ABNORMAL LOW (ref 70–99)
Glucose-Capillary: 113 mg/dL — ABNORMAL HIGH (ref 70–99)
Glucose-Capillary: 84 mg/dL (ref 70–99)

## 2013-08-27 LAB — CBC
HEMATOCRIT: 23 % — AB (ref 39.0–52.0)
Hemoglobin: 7.8 g/dL — ABNORMAL LOW (ref 13.0–17.0)
MCH: 31.7 pg (ref 26.0–34.0)
MCHC: 33.9 g/dL (ref 30.0–36.0)
MCV: 93.5 fL (ref 78.0–100.0)
PLATELETS: 402 10*3/uL — AB (ref 150–400)
RBC: 2.46 MIL/uL — ABNORMAL LOW (ref 4.22–5.81)
RDW: 15.2 % (ref 11.5–15.5)
WBC: 10.8 10*3/uL — AB (ref 4.0–10.5)

## 2013-08-27 MED ORDER — DEXTROSE 50 % IV SOLN
INTRAVENOUS | Status: AC
  Administered 2013-08-27: 25 mL
  Filled 2013-08-27: qty 50

## 2013-08-27 MED ORDER — GLUCOSE 40 % PO GEL
ORAL | Status: AC
  Administered 2013-08-27: 07:00:00
  Filled 2013-08-27: qty 1

## 2013-08-27 MED ORDER — DEXTROSE 50 % IV SOLN: 50.0000 mL | Freq: Once | INTRAVENOUS | Status: AC | PRN

## 2013-08-27 MED ORDER — GLUCOSE 40 % PO GEL
1.0000 | ORAL | Status: DC | PRN
  Administered 2013-08-27: 37.5 g via ORAL

## 2013-08-27 MED ORDER — HEPARIN SODIUM (PORCINE) 1000 UNIT/ML DIALYSIS: 20.0000 [IU]/kg | INTRAMUSCULAR | Status: DC | PRN

## 2013-08-27 NOTE — Progress Notes (Signed)
Clinical Social Work Department BRIEF PSYCHOSOCIAL ASSESSMENT 08/27/2013  Patient:  [name] [name],[name] [name]     Account Number:  [id number]     Admit date:  08/13/2013  Clinical Social Worker:  Adair Laundry  Date/Time:  08/27/2013 02:00 PM  Referred by:  Physician  Date Referred:  08/27/2013 Referred for  SNF Placement   Other Referral:   Interview type:  Patient Other interview type:    PSYCHOSOCIAL DATA Living Status:  ALONE Admitted from facility:   Level of care:   Primary support name:  Carl Butner [phone] Primary support relationship to patient:  SIBLING Degree of support available:   Pt has good support    CURRENT CONCERNS Current Concerns  Post-Acute Placement   Other Concerns:    SOCIAL WORK ASSESSMENT / PLAN CSW aware of PT recommendation for SNF. CSW visited pt room and spoke with pt about recommendation. Pt informed CSW he lives alone and is agreeable to ST rehab. Pt was concerned about being able to receive dialysis. CSW explained that when CSW faxes out referral CSW will notify facilities that pt is TTS dialysis [facility]. Pt wondering how he will get to dialysis as he usually walks. CSW informed that facility would insure that pt had transportation to dialysis. Pt thankful for this information. Pt did ask that CSW call his sister [name] to notify of plan as well. Pt was clear in saying that he will make decision on which facility not his sister. CSW to call pt sister and updated on current plan. Pt agreeable to being referred to all [place]   Assessment/plan status:  Psychosocial Support/Ongoing Assessment of Needs Other assessment/ plan:   Information/referral to community resources:   SNF list to be provided with bed offers    PATIENT'S/FAMILY'S RESPONSE TO PLAN OF CARE: Pt pleasant and coopeartive. Pt agreeable to [facility].       [place], [place]

## 2013-08-27 NOTE — Progress Notes (Signed)
Holding ambulation tonight due to patient not to be taken off suction today.  Will continue to monitor closely.  Pt in chair with call bell in reach.

## 2013-08-27 NOTE — Progress Notes (Addendum)
[address]       [place] [address]             [phone]      8 Days Post-Op Procedure(s) (LRB): CORONARY ARTERY BYPASS GRAFTING (CABG) times 5 using left internal mammary artery to LAD, saphenous vein graft to diagonal; sequential saphenous vein graft to intermediate and distal circumflex; and saphenous vein graft to right coronary artery  (N/A) INTRAOPERATIVE TRANSESOPHAGEAL ECHOCARDIOGRAM (N/A) ENDOVEIN HARVEST OF GREATER SAPHENOUS VEIN from left thigh and calf (Left) Subjective: Feels ok, no significant pain, not SOB , no recent BM, + Flatus  Objective: Vital signs in last 24 hours: Temp:  [98.1 F (36.7 C)-98.4 F (36.9 C)] 98.2 F (36.8 C) (08/18 0542) Pulse Rate:  [80-93] 80 (08/18 0542) Cardiac Rhythm:  [-] Normal sinus rhythm (08/17 1930) Resp:  [18] 18 (08/18 0542) BP: (93-111)/(52-76) 105/72 mmHg (08/18 0542) SpO2:  [98 %-100 %] 98 % (08/18 0542) Weight:  [165 lb 2 oz (74.9 kg)] 165 lb 2 oz (74.9 kg) (08/18 0542)  Hemodynamic parameters for last 24 hours:    Intake/Output from previous day: 08/17 0701 - 08/18 0700 In: 840 [P.O.:840] Out: 160 [Chest Tube:160] Intake/Output this shift:    General appearance: alert, cooperative and no distress Heart: regular rate and rhythm and + bruit Lungs: dim in bases Abdomen: + BS, soft, nontender Extremities: + edema Wound: incis healing well  Lab Results:  Recent Labs  08/25/13 1338 08/26/13 0047  WBC 8.4 9.1  HGB 8.9* 7.6*  HCT 27.1* 22.8*  PLT 318 333   BMET:  Recent Labs  08/24/13 0808 08/26/13 0047  NA 140 138  K 4.6 3.7  CL 98 94*  CO2 26 30  GLUCOSE 91 97  BUN 39* 38*  CREATININE 8.04* 7.62*  CALCIUM 9.9 9.7    PT/INR: No results found for this basename: LABPROT, INR,  in the last 72 hours ABG    Component Value Date/Time   PHART 7.358 08/19/2013 1958   HCO3 23.5 08/19/2013 1958   TCO2 24 08/20/2013 1640   ACIDBASEDEF 2.0 08/19/2013 1958   O2SAT 99.0 08/19/2013  1958   CBG (last 3)   Recent Labs  08/27/13 0714 08/27/13 0719 08/27/13 0732  GLUCAP 31* 30* 113*    Meds Scheduled Meds: . amiodarone  200 mg Oral Q12H   Followed by  . [START ON 08/29/2013] amiodarone  200 mg Oral Daily  . antiseptic oral rinse  7 mL Mouth Rinse BID  . aspirin EC  325 mg Oral Daily  . atorvastatin  20 mg Oral q1800  . darbepoetin (ARANESP) injection - DIALYSIS  25 mcg Intravenous Q Thu-HD  . docusate sodium  200 mg Oral Daily  . enoxaparin (LOVENOX) injection  30 mg Subcutaneous Q24H  . [START ON 08/29/2013] ferric gluconate (FERRLECIT/NULECIT) IV  62.5 mg Intravenous Q Thu-HD  . glipiZIDE  2.5 mg Oral Q breakfast  . insulin aspart  0-24 Units Subcutaneous TID AC & HS  . metoprolol tartrate  12.5 mg Oral BID  . pantoprazole  40 mg Oral QAC breakfast  . sevelamer carbonate  1,600 mg Oral TID WC  . sodium chloride  3 mL Intravenous Q12H  . tamsulosin  0.4 mg Oral QHS   Continuous Infusions:  PRN Meds:.sodium chloride, bisacodyl, bisacodyl, dextrose, dextrose, guaiFENesin, levalbuterol, oxyCODONE, sodium chloride, traMADol  Xrays Dg Chest Port 1 View  08/26/2013   CLINICAL DATA:  Left pneumothorax.  EXAM: PORTABLE  CHEST - 1 VIEW  COMPARISON:  08/25/2013  FINDINGS: Left chest tube remains in place. There has been a significant decrease in the left pneumothorax. There is still a small left apical pneumothorax.  Persistent prominence of the cardiac silhouette. Pulmonary vascularity is normal. No infiltrates or effusions.  IMPRESSION: Decrease in the left pneumothorax to approximately 5% since the prior study. No other change.   Electronically Signed   By: Rozetta Nunnery M.D.   On: 08/26/2013 08:07    Assessment/Plan: S/P Procedure(s) (LRB): CORONARY ARTERY BYPASS GRAFTING (CABG) times 5 using left internal mammary artery to LAD, saphenous vein graft to diagonal; sequential saphenous vein graft to intermediate and distal circumflex; and saphenous vein graft to right  coronary artery  (N/A) INTRAOPERATIVE TRANSESOPHAGEAL ECHOCARDIOGRAM (N/A) ENDOVEIN HARVEST OF GREATER SAPHENOUS VEIN from left thigh and calf (Left)  1 doing well 2 + air leak/ pneumotx- cont Chest tube to suction 3 low sugars will d/c glipizide for now 4 cont current rx for afib- in sinus 5 dialysis/renal  issues per nephrology   LOS: 14 days    GOLD,WAYNE E 08/27/2013  Still with intermittent air leak, chest tube to suction No more flutter, hold on full anticoagulation I have seen and examined [name] and agree with the above assessment  and plan.  [name] MD Beeper [phone] Office [phone] 08/27/2013 2:00 PM

## 2013-08-27 NOTE — Progress Notes (Signed)
Hypoglycemic Event  CBG: 65  Treatment: 4 oz of juice and applesauce  Symptoms: Asymptomatic    Follow-up CBG: Time: 0645 CBG Result:50  Treatment: Juice and snack   Follow up CBG: Time: 5056 CBG Result: 31  Symptoms: Asymptomatic  Treatment:  25 mL D50 IV  Follow up CBG: 113  CBG Result : 0732  Possible Reasons for Event: unknown    Quentin Angst A  Remember to initiate Hypoglycemia Order Set & complete

## 2013-08-27 NOTE — Progress Notes (Addendum)
Clinical Social Work Department CLINICAL SOCIAL WORK PLACEMENT NOTE 08/27/2013  Patient:  [name] [name],[name] [name]  Account Number:  [id number] [place] date:  08/13/2013  Clinical Social Worker:  Berton Mount, Latanya Presser  Date/time:  08/27/2013 03:44 PM  Clinical Social Work is seeking post-discharge placement for this patient at the following level of care:   SKILLED NURSING   (*CSW will update this form in Epic as items are completed)   08/27/2013  Patient/family provided with [place] Department of Clinical Social Work's list of facilities offering this level of care within the geographic area requested by the patient (or if unable, by the patient's family).  08/27/2013  Patient/family informed of their freedom to choose among providers that offer the needed level of care, that participate in Medicare, Medicaid or managed care program needed by the patient, have an available bed and are willing to accept the patient.  08/27/2013  Patient/family informed of [facility]' ownership interest in [facility], as well as of the fact that they are under no obligation to receive care at this facility.  PASARR submitted to EDS on 08/27/2013 PASARR number received on 08/27/2013  FL2 transmitted to all facilities in geographic area requested by pt/family on  08/27/2013 FL2 transmitted to all facilities within larger geographic area on   Patient informed that his/her managed care company has contracts with or will negotiate with  certain facilities, including the following:     Patient/family informed of bed offers received:  08/28/2013 Patient chooses bed at [facility] Physician recommends and patient chooses bed at    Patient to be transferred to [facility] on 09/06/2013   Patient to be transferred to facility by Ambulance Patient and family notified of transfer on 09/06/2013 Name of family member notified:  Patient to notify family members   The following physician request were entered in  Epic: Physician Request  Please sign FL2.    Additional CommentsBerton Mount, [place]

## 2013-08-27 NOTE — Progress Notes (Signed)
Patient Name: [name] [name] Date of Encounter: 08/27/2013  Principal Problem:   NSTEMI (non-ST elevated myocardial infarction) Active Problems:   ESRD on dialysis   Anemia   Systolic and diastolic CHF, acute on chronic   Sustained VT (ventricular tachycardia)   S/P CABG x 5    Patient Profile: 68 yo male w/ hx DM, ESRD on HD, HTN, S/D-CHF, mod MR, was admitted 08/04 with NSTEMI, had CABG x 5 on 08/10. Post-op PTX, still w/ chest tube, pacing wires d/c'd 08/16. Cardiology following for atrial flutter.  SUBJECTIVE: Doing well, was gradually getting stronger, but feels weaker today. Breathing no change.  OBJECTIVE Filed Vitals:   08/26/13 2127 08/26/13 2221 08/27/13 0542 08/27/13 1105  BP: 93/76 111/59 105/72 120/70  Pulse: 81 80 80 80  Temp: 98.1 F (36.7 C)  98.2 F (36.8 C)   TempSrc: Oral  Oral   Resp: 18  18   Height:      Weight:   165 lb 2 oz (74.9 kg)   SpO2: 100%  98%     Intake/Output Summary (Last 24 hours) at 08/27/13 1116 Last data filed at 08/27/13 1110  Gross per 24 hour  Intake    720 ml  Output    160 ml  Net    560 ml   Filed Weights   08/25/13 0404 08/26/13 0546 08/27/13 0542  Weight: 163 lb 2.3 oz (74 kg) 163 lb 2.3 oz (74 kg) 165 lb 2 oz (74.9 kg)    PHYSICAL EXAM General: Well developed, well nourished, male in no acute distress. Head: Normocephalic, atraumatic.  Neck: Supple without bruits, JVD not elevated  Lungs:  Resp regular and unlabored, rales bases, left chest tube in place. Heart: RRR, S1, S2, no S3, S4, or murmur; no rub. Abdomen: Soft, non-tender, non-distended, BS present.  Extremities: No clubbing, cyanosis, 1+ edema.  Neuro: Alert and oriented X 3. Moves all extremities spontaneously. Psych: Normal affect.  LABS: CBC: Recent Labs  08/25/13 1338 08/26/13 0047  WBC 8.4 9.1  HGB 8.9* 7.6*  HCT 27.1* 22.8*  MCV 96.4 93.8  PLT 318 704   Basic Metabolic Panel: Recent Labs  08/26/13 0047  NA 138  K 3.7  CL 94*    CO2 30  GLUCOSE 97  BUN 38*  CREATININE 7.62*  CALCIUM 9.7  PHOS 4.9*   Liver Function Tests: Recent Labs  08/26/13 0047  ALBUMIN 2.5*   TELE:  SR  Since yesterday pm.  Radiology/Studies: Dg Chest Port 1 View 08/27/2013   CLINICAL DATA:  Chest tube, postoperative evaluation  EXAM: PORTABLE CHEST - 1 VIEW  COMPARISON:  Portable exam 8889 hr compared 08/26/2013  FINDINGS: LEFT thoracostomy tube unchanged.  Persistent small LEFT apex pneumothorax unchanged.  Enlargement of cardiac silhouette post CABG.  Atherosclerotic calcification aorta.  Minimal bibasilar atelectasis.  Remaining lungs clear.  No pleural effusion or RIGHT pneumothorax.  IMPRESSION: Persistent small LEFT apex pneumothorax despite thoracostomy tube.  Enlargement of cardiac silhouette post CABG.  Minimal bibasilar atelectasis.   Electronically Signed   By: Lavonia Dana M.D.   On: 08/27/2013 08:12   Current Medications:  . amiodarone  200 mg Oral Q12H   Followed by  . [START ON 08/29/2013] amiodarone  200 mg Oral Daily  . antiseptic oral rinse  7 mL Mouth Rinse BID  . aspirin EC  325 mg Oral Daily  . atorvastatin  20 mg Oral q1800  . darbepoetin (ARANESP) injection - DIALYSIS  25 mcg Intravenous Q Thu-HD  . docusate sodium  200 mg Oral Daily  . enoxaparin (LOVENOX) injection  30 mg Subcutaneous Q24H  . [START ON 08/29/2013] ferric gluconate (FERRLECIT/NULECIT) IV  62.5 mg Intravenous Q Thu-HD  . insulin aspart  0-24 Units Subcutaneous TID AC & HS  . metoprolol tartrate  12.5 mg Oral BID  . pantoprazole  40 mg Oral QAC breakfast  . sevelamer carbonate  1,600 mg Oral TID WC  . sodium chloride  3 mL Intravenous Q12H  . tamsulosin  0.4 mg Oral QHS      ASSESSMENT AND PLAN: Principal Problem:  NSTEMI (non-ST elevated myocardial infarction) - had CABG, on ASA 325 mg, statin, BB. EF 40%   Active Problems:  Atrial flutter - has had at times since admission. On amio 200 mg BID and low-dose Lopressor. HR in flutter  generally well-controlled. Pt not aware of arrhythmia. Currently maintaining SR.   Anticoagulation - CHADs2VASC score is 4, he would be anticoagulation candidate, MD advise on Rx, currently on DVT Lovenox and ASA 325 mg.   ESRD on dialysis - per Nephrology   Anemia - per TCTS, Nephrology; with drop in H&H, may need transfusion   Systolic and diastolic CHF, acute on chronic - volume mgt per Nephrology   Sustained VT (ventricular tachycardia) - none seen in last 24 hr.  S/P CABG x 5 - POD 7, per TCTS   Left PTX - per TCTS, improving.  Signed, Rosaria Ferries , PA-C 11:16 AM 08/27/2013  I have examined the patient and reviewed assessment and plan and discussed with patient.  Agree with above as stated.  Appears to be in NSR at this time by monitor.  WIll check ECG to confirm.  Amiodarone will not be a long term medicine.  COnsider coumadin based on bleeding risk.  Will follow.  Leeona Mccardle S.

## 2013-08-27 NOTE — Procedures (Signed)
I was present at this dialysis session. I have reviewed the session itself and made appropriate changes.   Pearson Grippe  MD 08/27/2013, 3:04 PM

## 2013-08-27 NOTE — Progress Notes (Signed)
Physical Therapy Treatment Patient Details Name: [name] [name] MRN: [id number] DOB: [birth year] Today's Date: 08/27/2013    History of Present Illness Pt is 68 yo male with DM, ESRD on HD since 4/15, presents with CP and underwent CABG followed by pneumothorax, now with left chest tube.    PT Comments    Pt admitted with above. Pt currently with functional limitations due to endurance deficits as well as need for education for precautions.  Pt will benefit from skilled PT to increase their independence and safety with mobility to allow discharge to the venue listed below.    Follow Up Recommendations  SNF;Supervision/Assistance - 24 hour     Equipment Recommendations  Rolling walker with 5" wheels    Recommendations for Other Services       Precautions / Restrictions Precautions Precautions: Sternal Precaution Comments: reviewed sternal precautions Restrictions Weight Bearing Restrictions: No Other Position/Activity Restrictions: pt with chest tube on suction    Mobility  Bed Mobility               General bed mobility comments: pt in chair  Transfers Overall transfer level: Needs assistance Equipment used: Rolling walker (2 wheeled) Transfers: Sit to/from Stand Sit to Stand: Supervision         General transfer comment: supervision for safety and vc's for hand placement for sternal precautions.  Ambulation/Gait Ambulation/Gait assistance: Supervision Ambulation Distance (Feet): 150 Feet Assistive device: Rolling walker (2 wheeled) Gait Pattern/deviations: Step-through pattern;Decreased stride length   Gait velocity interpretation: <1.8 ft/sec, indicative of risk for recurrent falls General Gait Details: Pt safe with RW.  Tolerated ambulation.     Stairs            Wheelchair Mobility    Modified Rankin (Stroke Patients Only)       Balance           Standing balance support: Bilateral upper extremity supported;During functional  activity Standing balance-Leahy Scale: Fair Standing balance comment: pt can stand without UE support but feels better with RW.                      Cognition Arousal/Alertness: Awake/alert Behavior During Therapy: WFL for tasks assessed/performed Overall Cognitive Status: Within Functional Limits for tasks assessed                      Exercises General Exercises - Lower Extremity Ankle Circles/Pumps: AROM;10 reps;Seated Long Arc Quad: AROM;10 reps;Seated Hip Flexion/Marching: AROM;Both;10 reps;Seated    General Comments        Pertinent Vitals/Pain Pain Assessment: 0-10 Pain Score: 10-Worst pain ever Pain Location: top of buttocks Pain Descriptors / Indicators: Constant;Burning Pain Intervention(s): Monitored during session;Repositioned VSS    Home Living                      Prior Function            PT Goals (current goals can now be found in the care plan section) Progress towards PT goals: Progressing toward goals    Frequency  Min 3X/week    PT Plan Current plan remains appropriate    Co-evaluation             End of Session Equipment Utilized During Treatment: Gait belt Activity Tolerance: Patient limited by fatigue Patient left: in chair;with call bell/phone within reach     Time: 1005-1029 PT Time Calculation (min): 24 min  Charges:  $Gait Training: 8-22 mins $Therapeutic  Exercise: 8-22 mins                    G Codes:      INGOLD,Danali Marinos 09-19-2013, 11:13 AM [facility] [phone] (pager)

## 2013-08-28 ENCOUNTER — Inpatient Hospital Stay (HOSPITAL_COMMUNITY): Payer: Medicare Other

## 2013-08-28 LAB — GLUCOSE, CAPILLARY
GLUCOSE-CAPILLARY: 173 mg/dL — AB (ref 70–99)
Glucose-Capillary: 104 mg/dL — ABNORMAL HIGH (ref 70–99)
Glucose-Capillary: 93 mg/dL (ref 70–99)
Glucose-Capillary: 77 mg/dL (ref 70–99)

## 2013-08-28 MED ORDER — POLYETHYLENE GLYCOL 3350 17 G PO PACK
17.0000 g | PACK | Freq: Every day | ORAL | Status: DC
  Administered 2013-08-28 – 2013-09-05 (×5): 17 g via ORAL
  Filled 2013-08-28 (×10): qty 1

## 2013-08-28 NOTE — Progress Notes (Signed)
  [facility] Progress Note   Subjective: no new issues; uneventful HD yesterday.  Pt w/o c/o.  Using incentive spirometer.  Post weight 72.8kg  Filed Vitals:   08/27/13 1945 08/27/13 2129 08/28/13 0500 08/28/13 0640  BP: 95/81 110/41  108/52  Pulse: 65 93  86  Temp:  98.3 F (36.8 C)  98.5 F (36.9 C)  TempSrc:  Oral  Oral  Resp: 17 18  18   Height:      Weight:   71.85 kg (158 lb 6.4 oz)   SpO2:  100%  100%   Exam: Alert, no distress, calm in chair Chest is clear bilat RRR  Abd soft, NTND Mild edema LE's Neuro is alert and Ox 3 Left arm AVF +bruit  HD: [facility] [place] 4h   F180   400/800   2/2.0 Bath   73kg  Heparin 4000   L arm AVF Calcitriol 0.5 ug TIW, Venofer 50 mg q Thursday  pth 1000, P 5.6  tsat 16%  Hb 12     Assessment: 1 NSTEMI / CAD- s/p CABG 8/11 2 ESRD on HD TTS 3 HTN/ vol - just over dry wt, stable,  4 Anemia started aranesp 25 ug/wk, Stable with some lability, monitor 5 HPTH holding vit D for ^Ca, cont binders (renvela 2ac) and 2Ca bath 6 DM per primary 7 Aflutter - back in NSR, on amio and MTP 8 Hypotension- avoid excess UF next HD   Plan- Next HD tomorrow.     Recent Labs Lab 08/24/13 0808 08/26/13 0047 08/27/13 1532  NA 140 138 135*  K 4.6 3.7 4.1  CL 98 94* 90*  CO2 26 30 29   GLUCOSE 91 97 120*  BUN 39* 38* 56*  CREATININE 8.04* 7.62* 11.08*  CALCIUM 9.9 9.7 9.5  PHOS 5.7* 4.9* 5.9*    Recent Labs Lab 08/24/13 0808 08/26/13 0047 08/27/13 1532  ALBUMIN 2.6* 2.5* 2.7*    Recent Labs Lab 08/25/13 1338 08/26/13 0047 08/27/13 1531  WBC 8.4 9.1 10.8*  HGB 8.9* 7.6* 7.8*  HCT 27.1* 22.8* 23.0*  MCV 96.4 93.8 93.5  PLT 318 333 402*   . amiodarone  200 mg Oral Q12H   Followed by  . [START ON 08/29/2013] amiodarone  200 mg Oral Daily  . antiseptic oral rinse  7 mL Mouth Rinse BID  . aspirin EC  325 mg Oral Daily  . atorvastatin  20 mg Oral q1800  . darbepoetin (ARANESP) injection - DIALYSIS  25 mcg Intravenous Q  Thu-HD  . docusate sodium  200 mg Oral Daily  . enoxaparin (LOVENOX) injection  30 mg Subcutaneous Q24H  . [START ON 08/29/2013] ferric gluconate (FERRLECIT/NULECIT) IV  62.5 mg Intravenous Q Thu-HD  . insulin aspart  0-24 Units Subcutaneous TID AC & HS  . metoprolol tartrate  12.5 mg Oral BID  . pantoprazole  40 mg Oral QAC breakfast  . sevelamer carbonate  1,600 mg Oral TID WC  . sodium chloride  3 mL Intravenous Q12H  . tamsulosin  0.4 mg Oral QHS     sodium chloride, bisacodyl, bisacodyl, dextrose, guaiFENesin, levalbuterol, oxyCODONE, sodium chloride, traMADol

## 2013-08-28 NOTE — Progress Notes (Addendum)
[address]       [place] [address]             [phone]      9 Days Post-Op Procedure(s) (LRB): CORONARY ARTERY BYPASS GRAFTING (CABG) times 5 using left internal mammary artery to LAD, saphenous vein graft to diagonal; sequential saphenous vein graft to intermediate and distal circumflex; and saphenous vein graft to right coronary artery  (N/A) INTRAOPERATIVE TRANSESOPHAGEAL ECHOCARDIOGRAM (N/A) ENDOVEIN HARVEST OF GREATER SAPHENOUS VEIN from left thigh and calf (Left) Subjective: Feeling ok, no BM yesterday  Objective: Vital signs in last 24 hours: Temp:  [97.5 F (36.4 C)-98.5 F (36.9 C)] 98.5 F (36.9 C) (08/19 0640) Pulse Rate:  [65-93] 86 (08/19 0640) Cardiac Rhythm:  [-] Normal sinus rhythm (08/18 2052) Resp:  [12-20] 18 (08/19 0640) BP: (84-134)/(24-81) 108/52 mmHg (08/19 0640) SpO2:  [92 %-100 %] 100 % (08/19 0640) Weight:  [158 lb 6.4 oz (71.85 kg)-167 lb 15.9 oz (76.2 kg)] 158 lb 6.4 oz (71.85 kg) (08/19 0500)  Hemodynamic parameters for last 24 hours:    Intake/Output from previous day: 08/18 0701 - 08/19 0700 In: 360 [P.O.:360] Out: 3282 [Chest Tube:80] Intake/Output this shift: Total I/O In: -  Out: 50 [Chest Tube:50]  General appearance: alert, cooperative and no distress Heart: regular rate and rhythm Lungs: clear to auscultation bilaterally Abdomen: benign,+ BS Extremities: + LE edema Wound: incis healing well  Lab Results:  Recent Labs  08/26/13 0047 08/27/13 1531  WBC 9.1 10.8*  HGB 7.6* 7.8*  HCT 22.8* 23.0*  PLT 333 402*   BMET:  Recent Labs  08/26/13 0047 08/27/13 1532  NA 138 135*  K 3.7 4.1  CL 94* 90*  CO2 30 29  GLUCOSE 97 120*  BUN 38* 56*  CREATININE 7.62* 11.08*  CALCIUM 9.7 9.5    PT/INR: No results found for this basename: LABPROT, INR,  in the last 72 hours ABG    Component Value Date/Time   PHART 7.358 08/19/2013 1958   HCO3 23.5 08/19/2013 1958   TCO2 24 08/20/2013 1640   ACIDBASEDEF 2.0 08/19/2013 1958   O2SAT 99.0 08/19/2013 1958   CBG (last 3)   Recent Labs  08/27/13 1219 08/27/13 2130 08/28/13 0713  GLUCAP 84 97 104*    Meds Scheduled Meds: . amiodarone  200 mg Oral Q12H   Followed by  . [START ON 08/29/2013] amiodarone  200 mg Oral Daily  . antiseptic oral rinse  7 mL Mouth Rinse BID  . aspirin EC  325 mg Oral Daily  . atorvastatin  20 mg Oral q1800  . darbepoetin (ARANESP) injection - DIALYSIS  25 mcg Intravenous Q Thu-HD  . docusate sodium  200 mg Oral Daily  . enoxaparin (LOVENOX) injection  30 mg Subcutaneous Q24H  . [START ON 08/29/2013] ferric gluconate (FERRLECIT/NULECIT) IV  62.5 mg Intravenous Q Thu-HD  . insulin aspart  0-24 Units Subcutaneous TID AC & HS  . metoprolol tartrate  12.5 mg Oral BID  . pantoprazole  40 mg Oral QAC breakfast  . sevelamer carbonate  1,600 mg Oral TID WC  . sodium chloride  3 mL Intravenous Q12H  . tamsulosin  0.4 mg Oral QHS   Continuous Infusions:  PRN Meds:.sodium chloride, bisacodyl, bisacodyl, dextrose, guaiFENesin, levalbuterol, oxyCODONE, sodium chloride, traMADol  Xrays Dg Chest Port 1 View  08/27/2013   CLINICAL DATA:  Chest tube, postoperative evaluation  EXAM: PORTABLE CHEST - 1 VIEW  COMPARISON:  Portable  exam 9794 hr compared 08/26/2013  FINDINGS: LEFT thoracostomy tube unchanged.  Persistent small LEFT apex pneumothorax unchanged.  Enlargement of cardiac silhouette post CABG.  Atherosclerotic calcification aorta.  Minimal bibasilar atelectasis.  Remaining lungs clear.  No pleural effusion or RIGHT pneumothorax.  IMPRESSION: Persistent small LEFT apex pneumothorax despite thoracostomy tube.  Enlargement of cardiac silhouette post CABG.  Minimal bibasilar atelectasis.   Electronically Signed   By: Lavonia Dana M.D.   On: 08/27/2013 08:12    Assessment/Plan: S/P Procedure(s) (LRB): CORONARY ARTERY BYPASS GRAFTING (CABG) times 5 using left internal mammary artery to LAD, saphenous vein graft to  diagonal; sequential saphenous vein graft to intermediate and distal circumflex; and saphenous vein graft to right coronary artery  (N/A) INTRAOPERATIVE TRANSESOPHAGEAL ECHOCARDIOGRAM (N/A) ENDOVEIN HARVEST OF GREATER SAPHENOUS VEIN from left thigh and calf (Left)  1 CT still with intermit air leak, CXR pending- keep tube , poss d/c suction soon 2 sugars ok off glucotrol 3 no new labs 4 nephrology managing ESRD 5 maint NSR at this time    LOS: 15 days    [name] [name] 08/28/2013  Will try m off suction on ct  I have seen and examined [name] [name] and agree with the above assessment  and plan.  [name] Isaac MD Beeper [phone] Office [phone] 08/28/2013 1:58 PM

## 2013-08-28 NOTE — Progress Notes (Signed)
Patient Profile:  68 yo male w/ hx DM, ESRD on HD, HTN, S/D-CHF, mod MR, was admitted 08/04 with NSTEMI, had CABG x 5 on 08/10. Post-op PTX, still w/ chest tube, pacing wires d/c'd 08/16. Cardiology following for atrial flutter.  Subjective: No complaints. Denies CP and dyspnea.   Objective: Vital signs in last 24 hours: Temp:  [97.5 F (36.4 C)-98.5 F (36.9 C)] 98.5 F (36.9 C) (08/19 0640) Pulse Rate:  [65-93] 86 (08/19 0640) Resp:  [12-20] 18 (08/19 0640) BP: (84-134)/(24-81) 108/52 mmHg (08/19 0640) SpO2:  [92 %-100 %] 100 % (08/19 0640) Weight:  [158 lb 6.4 oz (71.85 kg)-167 lb 15.9 oz (76.2 kg)] 158 lb 6.4 oz (71.85 kg) (08/19 0500) Last BM Date: 08/20/13  Intake/Output from previous day: 08/18 0701 - 08/19 0700 In: 360 [P.O.:360] Out: 3282 [Chest Tube:80] Intake/Output this shift: Total I/O In: -  Out: 50 [Chest Tube:50]  Medications Current Facility-Administered Medications  Medication Dose Route Frequency Provider Last Rate Last Dose  . 0.9 %  sodium chloride infusion  250 mL Intravenous PRN [name], MD      . amiodarone (PACERONE) tablet 200 mg  200 mg Oral Q12H [name], MD   200 mg at 08/27/13 2132   Followed by  . [START ON 08/29/2013] amiodarone (PACERONE) tablet 200 mg  200 mg Oral Daily [name], MD      . antiseptic oral rinse (CPC / CETYLPYRIDINIUM CHLORIDE 0.05%) solution 7 mL  7 mL Mouth Rinse BID [name], MD   7 mL at 08/27/13 2200  . aspirin EC tablet 325 mg  325 mg Oral Daily [name], MD   325 mg at 08/27/13 2132  . atorvastatin (LIPITOR) tablet 20 mg  20 mg Oral q1800 [name], MD   20 mg at 08/27/13 2137  . bisacodyl (DULCOLAX) EC tablet 10 mg  10 mg Oral Daily PRN [name], MD       Or  . bisacodyl (DULCOLAX) suppository 10 mg  10 mg Rectal Daily PRN [name], MD      . darbepoetin [facility]) injection 25 mcg  25 mcg Intravenous Q Thu-HD [name], MD   25 mcg at  08/22/13 1348  . dextrose (GLUTOSE) 40 % oral gel 37.5 g  1 Tube Oral PRN [name], MD   37.5 g at 08/27/13 0654  . docusate sodium (COLACE) capsule 200 mg  200 mg Oral Daily [name], MD   200 mg at 08/27/13 2132  . enoxaparin (LOVENOX) injection 30 mg  30 mg Subcutaneous Q24H [name], MD   30 mg at 08/27/13 2137  . [START ON 08/29/2013] ferric gluconate (NULECIT) 62.5 mg in sodium chloride 0.9 % 100 mL IVPB  62.5 mg Intravenous Q Thu-HD [name], MD      . guaiFENesin [facility]) 12 hr tablet 600 mg  600 mg Oral Q12H PRN [name], MD   600 mg at 08/23/13 0516  . insulin aspart (novoLOG) injection 0-24 Units  0-24 Units Subcutaneous TID AC & HS [name], MD      . levalbuterol [facility]) nebulizer solution 0.63 mg  0.63 mg Nebulization Q6H PRN [name], MD      . metoprolol tartrate (LOPRESSOR) tablet 12.5 mg  12.5 mg Oral BID [name], MD   12.5 mg at 08/27/13 2132  . oxyCODONE (Oxy IR/ROXICODONE) immediate release tablet 5 mg  5 mg Oral Q4H PRN [name], MD   5 mg at 08/24/13 2209  . pantoprazole (PROTONIX) EC tablet 40 mg  40 mg Oral QAC breakfast [name], MD   40 mg at 08/28/13 2229  . sevelamer carbonate (RENVELA) tablet 1,600 mg  1,600 mg Oral TID WC [name], MD   1,600 mg at 08/28/13 7989  . sodium chloride 0.9 % injection 3 mL  3 mL Intravenous Q12H [name], MD   3 mL at 08/27/13 2137  . sodium chloride 0.9 % injection 3 mL  3 mL Intravenous PRN [name], MD      . tamsulosin [facility]) capsule 0.4 mg  0.4 mg Oral QHS [name], MD   0.4 mg at 08/27/13 2132  . traMADol (ULTRAM) tablet 50 mg  50 mg Oral Q12H PRN [name], MD   50 mg at 08/26/13 2222    PE: General appearance: alert, cooperative and no distress Lungs: clear to auscultation bilaterally Heart: regular rate and rhythm and 1/6 murmur at LUSB Extremities: 1+ bilateral pretibial pitting  edema Pulses: 2+ and symmetric Skin: warm and dry Neurologic: Grossly normal  Lab Results:   Recent Labs  08/25/13 1338 08/26/13 0047 08/27/13 1531  WBC 8.4 9.1 10.8*  HGB 8.9* 7.6* 7.8*  HCT 27.1* 22.8* 23.0*  PLT 318 333 402*   BMET  Recent Labs  08/26/13 0047 08/27/13 1532  NA 138 135*  K 3.7 4.1  CL 94* 90*  CO2 30 29  GLUCOSE 97 120*  BUN 38* 56*  CREATININE 7.62* 11.08*  CALCIUM 9.7 9.5    Assessment/Plan  Principal Problem:   NSTEMI (non-ST elevated myocardial infarction) Active Problems:   ESRD on dialysis   Anemia   Systolic and diastolic CHF, acute on chronic   Sustained VT (ventricular tachycardia)   S/P CABG x 5  1. NSTEMI/ CAD: s/p CABG x5. Continue ASA, BB, statin. BP too soft to tolerate an ACE/ARB  2.  Atrial Flutter: currently in SR. HR in the low 80s. Continue PO amiodarone and Lopressor for rhythm/rate control. His CHADs2VASC score is 4, he would be an anticoagulation candidate. Currently on DVT Lovenox and ASA 325 mg. Will defer decision to TCTS.   3. ESRD: HD per nephrology  4. Anemia: per TCTS, Nephrology. Hgb yesterday at 7.8. No CBC performed today.   5. Systolic + Diastolic CHF: EF 21%. Volume removal during HD.  6. Left PTX: per TCTS. Repeat CXR pending.     LOS: 15 days    Brittainy M. Ladoris Gene 08/28/2013 8:58 AM  I have examined the patient and reviewed assessment and plan and discussed with patient.  Agree with above as stated.  Reviewed ECGs . 8/17 ECG clearly shows flutter waves in leads II, III, AVF.  ECG toady is NSR.  Amiodarone should be short term.  There are risks with anticoagulation given his anemia.  Not unreasonable to hold off on COumadin at this time.   VARANASI,JAYADEEP S.

## 2013-08-28 NOTE — Progress Notes (Signed)
CARDIAC REHAB PHASE I   PRE:  Rate/Rhythm: 85 SR  BP:  Supine: 115/53  Sitting:   Standing:    SaO2: 92 RA  MODE:  Ambulation: 550 ft   POST:  Rate/Rhythm: 93 SR  BP:  Supine: 90/61  Sitting:   Standing:    SaO2: 95 RA 1040-1110 Assisted X 1 and used walker to ambulate. Gait steady with walker. Pt able to walk 550 feet without c/o. Pt back to bed after walk, states that he had been in chair and had just laid down. BP lower after walk, pt denies any dizziness walking. Call light in reach.  Rodney Langton RN 08/28/2013 11:08 AM

## 2013-08-29 ENCOUNTER — Inpatient Hospital Stay (HOSPITAL_COMMUNITY): Payer: Medicare Other

## 2013-08-29 LAB — GLUCOSE, CAPILLARY
Glucose-Capillary: 93 mg/dL (ref 70–99)
Glucose-Capillary: 78 mg/dL (ref 70–99)
Glucose-Capillary: 145 mg/dL — ABNORMAL HIGH (ref 70–99)
Glucose-Capillary: 101 mg/dL — ABNORMAL HIGH (ref 70–99)

## 2013-08-29 LAB — CBC
HCT: 21.9 % — ABNORMAL LOW (ref 39.0–52.0)
HEMOGLOBIN: 7.4 g/dL — AB (ref 13.0–17.0)
MCH: 31.4 pg (ref 26.0–34.0)
MCHC: 33.8 g/dL (ref 30.0–36.0)
MCV: 92.8 fL (ref 78.0–100.0)
PLATELETS: 434 10*3/uL — AB (ref 150–400)
RBC: 2.36 MIL/uL — ABNORMAL LOW (ref 4.22–5.81)
RDW: 15.4 % (ref 11.5–15.5)
WBC: 6.9 10*3/uL (ref 4.0–10.5)

## 2013-08-29 LAB — RENAL FUNCTION PANEL
ALBUMIN: 2.5 g/dL — AB (ref 3.5–5.2)
ANION GAP: 15 (ref 5–15)
BUN: 30 mg/dL — ABNORMAL HIGH (ref 6–23)
CO2: 26 mEq/L (ref 19–32)
Calcium: 9.3 mg/dL (ref 8.4–10.5)
Chloride: 99 mEq/L (ref 96–112)
Creatinine, Ser: 7.69 mg/dL — ABNORMAL HIGH (ref 0.50–1.35)
GFR, EST AFRICAN AMERICAN: 8 mL/min — AB (ref >90)
GFR, EST NON AFRICAN AMERICAN: 6 mL/min — AB (ref >90)
GLUCOSE: 133 mg/dL — AB (ref 70–99)
PHOSPHORUS: 4 mg/dL (ref 2.3–4.6)
POTASSIUM: 4 meq/L (ref 3.7–5.3)
Sodium: 140 mEq/L (ref 137–147)

## 2013-08-29 MED ORDER — DARBEPOETIN ALFA-POLYSORBATE 25 MCG/0.42ML IJ SOLN
INTRAMUSCULAR | Status: AC
  Administered 2013-08-29: 25 ug via INTRAVENOUS
  Filled 2013-08-29: qty 0.42

## 2013-08-29 NOTE — Progress Notes (Addendum)
No bowel movement after miralaxx and dulcolax, will continue to monitor. Poor appetite noted, ate food brought in by family at noon.

## 2013-08-29 NOTE — Progress Notes (Signed)
Patient Name: [name] [name] Date of Encounter: 08/29/2013  Principal Problem:   NSTEMI (non-ST elevated myocardial infarction) Active Problems:   ESRD on dialysis   Anemia   Systolic and diastolic CHF, acute on chronic   Sustained VT (ventricular tachycardia)   S/P CABG x 5    Patient Profile: 68 yo male w/ hx DM, ESRD on HD, HTN, S/D-CHF, mod MR, was admitted 08/04 with NSTEMI, had CABG x 5 on 08/10. Post-op PTX, still w/ chest tube, pacing wires d/c'd 08/16. Cardiology following for atrial flutter.   SUBJECTIVE: No palpitations, progressing well. Tolerating HD. Hopes the chest tube will come out soon.  OBJECTIVE Filed Vitals:   08/29/13 0900 08/29/13 0930 08/29/13 1000 08/29/13 1030  BP: 107/60 92/59 106/59 111/62  Pulse: 88 89 80 88  Temp:      TempSrc:      Resp:      Height:      Weight:      SpO2:        Intake/Output Summary (Last 24 hours) at 08/29/13 1050 Last data filed at 08/29/13 0733  Gross per 24 hour  Intake    730 ml  Output     30 ml  Net    700 ml   Filed Weights   08/28/13 0500 08/29/13 0452 08/29/13 0746  Weight: 158 lb 6.4 oz (71.85 kg) 160 lb 3.2 oz (72.666 kg) 165 lb 12.6 oz (75.2 kg)    PHYSICAL EXAM General: Well developed, well nourished, male in no acute distress. Head: Normocephalic, atraumatic.  Neck: Supple without bruits, JVD. Lungs:  Resp regular and unlabored, CTA. Heart: RRR, S1, S2, no S3, S4, or murmur; no rub. Abdomen: Soft, non-tender, non-distended, BS + x 4.  Extremities: No clubbing, cyanosis, edema.  Neuro: Alert and oriented X 3. Moves all extremities spontaneously. Psych: Normal affect.  LABS: CBC: Recent Labs  08/27/13 1531 08/29/13 0813  WBC 10.8* 6.9  HGB 7.8* 7.4*  HCT 23.0* 21.9*  MCV 93.5 92.8  PLT 402* 829*   Basic Metabolic Panel: Recent Labs  08/27/13 1532 08/29/13 0813  NA 135* 140  K 4.1 4.0  CL 90* 99  CO2 29 26  GLUCOSE 120* 133*  BUN 56* 30*  CREATININE 11.08* 7.69*  CALCIUM  9.5 9.3  PHOS 5.9* 4.0   Liver Function Tests: Recent Labs  08/27/13 1532 08/29/13 0813  ALBUMIN 2.7* 2.5*          Radiology/Studies: Dg Abd Portable 1v 08/28/2013   CLINICAL DATA:  Lack of bowel movement.  EXAM: PORTABLE ABDOMEN - 1 VIEW  COMPARISON:  None.  FINDINGS: The bowel gas pattern is normal. No radio-opaque calculi or other significant radiographic abnormality are seen. Stool is noted in the rectum.  IMPRESSION: No evidence of bowel obstruction or ileus.   Electronically Signed   By: Sabino Dick M.D.   On: 08/28/2013 11:32    Current Medications:  . amiodarone  200 mg Oral Daily  . antiseptic oral rinse  7 mL Mouth Rinse BID  . aspirin EC  325 mg Oral Daily  . atorvastatin  20 mg Oral q1800  . darbepoetin (ARANESP) injection - DIALYSIS  25 mcg Intravenous Q Thu-HD  . docusate sodium  200 mg Oral Daily  . enoxaparin (LOVENOX) injection  30 mg Subcutaneous Q24H  . ferric gluconate (FERRLECIT/NULECIT) IV  62.5 mg Intravenous Q Thu-HD  . insulin aspart  0-24 Units Subcutaneous TID AC & HS  . metoprolol  tartrate  12.5 mg Oral BID  . pantoprazole  40 mg Oral QAC breakfast  . polyethylene glycol  17 g Oral Daily  . sevelamer carbonate  1,600 mg Oral TID WC  . sodium chloride  3 mL Intravenous Q12H  . tamsulosin  0.4 mg Oral QHS      ASSESSMENT AND PLAN: 1. NSTEMI/ CAD: s/p CABG x5. Continue ASA, BB, statin. BP too soft to tolerate an ACE/ARB   2. Atrial Flutter: Has been in SR > 48 hr. HR in the low 80s. Continue PO amiodarone and Lopressor for rhythm/rate control.   3. ESRD: HD per nephrology   4. Anemia: per TCTS, Nephrology. Hgb 08/20 7.4.    5. Systolic + Diastolic CHF: EF 42%. Volume removal during HD, per nephrology.  6. Left PTX: per TCTS. Repeat CXR pending.   7. Anticoagulation:  His CHADs2VASC score is 4, he would be an anticoagulation candidate. Currently on DVT Lovenox and ASA 325 mg. Will defer decision to TCTS.   Otherwise per TCTS, has f/u  appt.  Signed, Rosaria Ferries , PA-C 10:50 AM 08/29/2013  I have examined the patient and reviewed assessment and plan and discussed with patient. Agree with above as stated.  Amiodarone should be short term. There are risks with anticoagulation given his anemia. Not unreasonable to hold off on COumadin at this time.  Gram Siedlecki S.

## 2013-08-29 NOTE — Progress Notes (Signed)
10 Days Post-Op Procedure(s) (LRB): CORONARY ARTERY BYPASS GRAFTING (CABG) times 5 using left internal mammary artery to LAD, saphenous vein graft to diagonal; sequential saphenous vein graft to intermediate and distal circumflex; and saphenous vein graft to right coronary artery  (N/A) INTRAOPERATIVE TRANSESOPHAGEAL ECHOCARDIOGRAM (N/A) ENDOVEIN HARVEST OF GREATER SAPHENOUS VEIN from left thigh and calf (Left) Subjective: Feels ok, no new complaints  Objective: Vital signs in last 24 hours: Temp:  [97.7 F (36.5 C)-99 F (37.2 C)] 97.7 F (36.5 C) (08/20 0746) Pulse Rate:  [80-104] 90 (08/20 1130) Cardiac Rhythm:  [-] Normal sinus rhythm (08/20 0800) Resp:  [16-18] 17 (08/20 0746) BP: (92-119)/(52-96) 105/96 mmHg (08/20 1130) SpO2:  [95 %-100 %] 95 % (08/20 0746) Weight:  [160 lb 3.2 oz (72.666 kg)-165 lb 12.6 oz (75.2 kg)] 165 lb 12.6 oz (75.2 kg) (08/20 0746)  Hemodynamic parameters for last 24 hours:    Intake/Output from previous day: 08/19 0701 - 08/20 0700 In: 730 [P.O.:730] Out: 50 [Chest Tube:50] Intake/Output this shift: Total I/O In: -  Out: 30 [Chest Tube:30]  General appearance: alert, cooperative and no distress Heart: regular rate and rhythm Lungs: somewhat coarse in lower fields Abdomen: soft, non-tender, nondistended Extremities: minor edema Wound: incis healing well  Lab Results:  Recent Labs  08/27/13 1531 08/29/13 0813  WBC 10.8* 6.9  HGB 7.8* 7.4*  HCT 23.0* 21.9*  PLT 402* 434*   BMET:  Recent Labs  08/27/13 1532 08/29/13 0813  NA 135* 140  K 4.1 4.0  CL 90* 99  CO2 29 26  GLUCOSE 120* 133*  BUN 56* 30*  CREATININE 11.08* 7.69*  CALCIUM 9.5 9.3    PT/INR: No results found for this basename: LABPROT, INR,  in the last 72 hours ABG    Component Value Date/Time   PHART 7.358 08/19/2013 1958   HCO3 23.5 08/19/2013 1958   TCO2 24 08/20/2013 1640   ACIDBASEDEF 2.0 08/19/2013 1958   O2SAT 99.0 08/19/2013 1958   CBG (last 3)    Recent Labs  08/28/13 1600 08/28/13 2114 08/29/13 0622  GLUCAP 173* 77 93    Meds Scheduled Meds: . amiodarone  200 mg Oral Daily  . antiseptic oral rinse  7 mL Mouth Rinse BID  . aspirin EC  325 mg Oral Daily  . atorvastatin  20 mg Oral q1800  . darbepoetin (ARANESP) injection - DIALYSIS  25 mcg Intravenous Q Thu-HD  . docusate sodium  200 mg Oral Daily  . enoxaparin (LOVENOX) injection  30 mg Subcutaneous Q24H  . ferric gluconate (FERRLECIT/NULECIT) IV  62.5 mg Intravenous Q Thu-HD  . insulin aspart  0-24 Units Subcutaneous TID AC & HS  . metoprolol tartrate  12.5 mg Oral BID  . pantoprazole  40 mg Oral QAC breakfast  . polyethylene glycol  17 g Oral Daily  . sevelamer carbonate  1,600 mg Oral TID WC  . sodium chloride  3 mL Intravenous Q12H  . tamsulosin  0.4 mg Oral QHS   Continuous Infusions:  PRN Meds:.sodium chloride, bisacodyl, bisacodyl, dextrose, guaiFENesin, levalbuterol, oxyCODONE, sodium chloride, traMADol  Xrays Dg Abd Portable 1v  08/28/2013   CLINICAL DATA:  Lack of bowel movement.  EXAM: PORTABLE ABDOMEN - 1 VIEW  COMPARISON:  None.  FINDINGS: The bowel gas pattern is normal. No radio-opaque calculi or other significant radiographic abnormality are seen. Stool is noted in the rectum.  IMPRESSION: No evidence of bowel obstruction or ileus.   Electronically Signed   By: Sabino Dick  M.D.   On: 08/28/2013 11:32    Assessment/Plan: S/P Procedure(s) (LRB): CORONARY ARTERY BYPASS GRAFTING (CABG) times 5 using left internal mammary artery to LAD, saphenous vein graft to diagonal; sequential saphenous vein graft to intermediate and distal circumflex; and saphenous vein graft to right coronary artery  (N/A) INTRAOPERATIVE TRANSESOPHAGEAL ECHOCARDIOGRAM (N/A) ENDOVEIN HARVEST OF GREATER SAPHENOUS VEIN from left thigh and calf (Left)  1 currently in dialysis- plans as per nephrology 2 + air leak- CXR is pending- keep CT in place 3 on aranesp, anemia mat be  approaching need for transfusion    LOS: 16 days    David Towson E 08/29/2013

## 2013-08-29 NOTE — Progress Notes (Signed)
PT Cancellation Note  Patient Details Name: [name] [name] MRN: [id number] DOB: 09-Jul-1945   Cancelled Treatment:    Reason Eval/Treat Not Completed: Patient at procedure or test/unavailable (Pt in HD in AM and has now just amb with cardiac rehab.)   [facility] 08/29/2013, 3:09 PM

## 2013-08-29 NOTE — Procedures (Signed)
I was present at this dialysis session. I have reviewed the session itself and made appropriate changes.   Pt w/o complaint at beginning of Tx.  Target weight 73kg.   Pearson Grippe  MD 08/29/2013, 7:59 AM

## 2013-08-29 NOTE — Progress Notes (Signed)
CARDIAC REHAB PHASE I   PRE:  Rate/Rhythm: 96 SR  BP:  Supine:   Sitting: 94/43  Standing:    SaO2: 96 RA  MODE:  Ambulation: 690 ft   POST:  Rate/Rhythm: 101 ST  BP:  Supine:   Sitting: 104/61  Standing: 9   SaO2: 95 RA 1400-1430 Assisted X 1 and used walker to ambulate. Gait steady with walker. Pt able to walk 690 feet without c/o. Pt back to recliner after walk with call light in reach.  Rodney Langton RN 08/29/2013 2:31 PM

## 2013-08-30 ENCOUNTER — Inpatient Hospital Stay (HOSPITAL_COMMUNITY): Payer: Medicare Other

## 2013-08-30 LAB — IRON AND TIBC
Iron: 26 ug/dL — ABNORMAL LOW (ref 42–135)
Saturation Ratios: 11 % — ABNORMAL LOW (ref 20–55)
TIBC: 232 ug/dL (ref 215–435)
UIBC: 206 ug/dL (ref 125–400)

## 2013-08-30 LAB — GLUCOSE, CAPILLARY
GLUCOSE-CAPILLARY: 98 mg/dL (ref 70–99)
Glucose-Capillary: 127 mg/dL — ABNORMAL HIGH (ref 70–99)
Glucose-Capillary: 177 mg/dL — ABNORMAL HIGH (ref 70–99)
Glucose-Capillary: 105 mg/dL — ABNORMAL HIGH (ref 70–99)

## 2013-08-30 NOTE — Progress Notes (Signed)
Physical Therapy Treatment Patient Details Name: [name] [name] MRN: [id number] DOB: 1945/05/30 Today's Date: 08/30/2013    History of Present Illness Pt is 68 yo male with DM, ESRD on HD since 4/15, presents with CP and underwent CABG followed by pneumothorax, now with left chest tube.    PT Comments    **Pt is making good progress with mobility. Today he walked 400' with RW. *  Follow Up Recommendations  SNF;Supervision/Assistance - 24 hour     Equipment Recommendations  Rolling walker with 5" wheels    Recommendations for Other Services       Precautions / Restrictions Precautions Precautions: Sternal Restrictions Weight Bearing Restrictions: No Other Position/Activity Restrictions: pt with chest tube on suction    Mobility  Bed Mobility               General bed mobility comments: pt in chair  Transfers Overall transfer level: Independent Equipment used: None Transfers: Sit to/from Stand Sit to Stand: Independent         General transfer comment: good adherence to sternal precautions  Ambulation/Gait Ambulation/Gait assistance: Supervision Ambulation Distance (Feet): 400 Feet Assistive device: Rolling walker (2 wheeled) Gait Pattern/deviations: Decreased stride length     General Gait Details: Pt safe with RW.  Tolerated ambulation well.    Stairs            Wheelchair Mobility    Modified Rankin (Stroke Patients Only)       Balance     Sitting balance-Leahy Scale: Normal     Standing balance support: Bilateral upper extremity supported;During functional activity Standing balance-Leahy Scale: Fair                      Cognition Arousal/Alertness: Awake/alert Behavior During Therapy: WFL for tasks assessed/performed Overall Cognitive Status: Within Functional Limits for tasks assessed                      Exercises      General Comments        Pertinent Vitals/Pain Pain Assessment: No/denies pain     Home Living                      Prior Function            PT Goals (current goals can now be found in the care plan section) Acute Rehab PT Goals Patient Stated Goal: return home PT Goal Formulation: With patient Time For Goal Achievement: 09/09/13 Potential to Achieve Goals: Good Progress towards PT goals: Progressing toward goals    Frequency  Min 3X/week    PT Plan Current plan remains appropriate    Co-evaluation             End of Session Equipment Utilized During Treatment: Gait belt Activity Tolerance: Patient tolerated treatment well Patient left: in chair;with call bell/phone within reach     Time: 1230-1243 PT Time Calculation (min): 13 min  Charges:  $Gait Training: 8-22 mins                    G Codes:      Philomena Doheny 08/30/2013, 12:54 PM [phone]

## 2013-08-30 NOTE — Clinical Social Work Note (Signed)
Patient progressing with PT-noted he walked 400' today with PT. Awaiting medical clearance for return to SNF bed at [facility]. CSW will follow-  Eduard Clos, MSW, [place]

## 2013-08-30 NOTE — Progress Notes (Signed)
Patient Profile: 68 yo male w/ hx DM, ESRD on HD, HTN, S/D-CHF, mod MR, was admitted 08/04 with NSTEMI, had CABG x 5 on 08/10. Post-op PTX, still w/ chest tube, pacing wires d/c'd 08/16. Cardiology following for atrial flutter Subjective: Only complains of constipation  Objective: Vital signs in last 24 hours: Temp:  [98.1 F (36.7 C)-98.5 F (36.9 C)] 98.1 F (36.7 C) (08/21 0618) Pulse Rate:  [82-96] 90 (08/21 0618) Resp:  [18-19] 18 (08/21 0618) BP: (104-122)/(51-96) 118/66 mmHg (08/21 0618) SpO2:  [97 %-100 %] 98 % (08/21 0618) Weight:  [156 lb 15.5 oz (71.2 kg)-157 lb 3.2 oz (71.305 kg)] 157 lb 3.2 oz (71.305 kg) (08/21 0618) Weight change: 5 lb 9.4 oz (2.534 kg) Last BM Date: 08/21/13 Intake/Output from previous day: -955 08/20 0701 - 08/21 0700 In: 480 [P.O.:480] Out: 1435 [Chest Tube:100] Intake/Output this shift:    PE: General:Pleasant affect, NAD Skin:Warm and dry, brisk capillary refill HEENT:normocephalic, sclera clear, mucus membranes moist Neck:supple, no JVD, no bruits  Heart:S1S2 RRR without murmur, gallup, rub or click Lungs:diminished  without rales, rhonchi, or wheezes VQM:GQQP, non tender, + BS, do not palpate liver spleen or masses Ext:no lower ext edema, 2+ pedal pulses, 2+ radial pulses Neuro:alert and oriented, MAE, follows commands, + facial symmetry  tele:  SR Lab Results:  Recent Labs  08/27/13 1531 08/29/13 0813  WBC 10.8* 6.9  HGB 7.8* 7.4*  HCT 23.0* 21.9*  PLT 402* 434*   BMET  Recent Labs  08/27/13 1532 08/29/13 0813  NA 135* 140  K 4.1 4.0  CL 90* 99  CO2 29 26  GLUCOSE 120* 133*  BUN 56* 30*  CREATININE 11.08* 7.69*  CALCIUM 9.5 9.3   No results found for this basename: TROPONINI, CK, MB,  in the last 72 hours  Lab Results  Component Value Date   CHOL 87 04/22/2013   HDL 31* 04/22/2013   LDLCALC 41 04/22/2013   TRIG 76 04/22/2013   CHOLHDL 2.8 04/22/2013   Lab Results  Component Value Date   HGBA1C  5.9* 08/19/2013     Lab Results  Component Value Date   TSH 2.760 04/21/2013    Hepatic Function Panel  Recent Labs  08/29/13 0813  ALBUMIN 2.5*   No results found for this basename: CHOL,  in the last 72 hours No results found for this basename: PROTIME,  in the last 72 hours     Studies/Results: Dg Chest Port 1 View  08/30/2013   CLINICAL DATA:  Follow-up pneumothorax.  EXAM: PORTABLE CHEST - 1 VIEW  COMPARISON:  08/29/2013  FINDINGS: Sequelae of prior CABG are again identified. Cardiac silhouette remains mildly enlarged, unchanged. Left-sided chest tube remains in place. Left-sided pneumothorax has decreased in size, with at most a tiny residual apical pneumothorax remaining. The patient has taken a slightly shallower inspiration than on the prior study with mild vascular crowding but no confluent airspace opacity or pulmonary edema. No pleural effusion is seen.  IMPRESSION: Decreased size of left pneumothorax with at most a tiny residual apical pneumothorax remaining.   Electronically Signed   By: Logan Bores   On: 08/30/2013 08:10   Dg Chest Port 1 View  08/29/2013   CLINICAL DATA:  Chest tube.  EXAM: PORTABLE CHEST - 1 VIEW  COMPARISON:  08/27/2013.  FINDINGS: Left chest tube noted in good anatomic position. Stable left pneumothorax. Right lung is clear. Stable cardiomegaly. Prior CABG. No CHF.  IMPRESSION:  1. Left chest tube in stable position. Stable small left pneumothorax. 2. Stable cardiomegaly.  Prior CABG.  No CHF.   Electronically Signed   By: Marcello Moores  Register   On: 08/29/2013 13:50   Dg Abd Portable 1v  08/28/2013   CLINICAL DATA:  Lack of bowel movement.  EXAM: PORTABLE ABDOMEN - 1 VIEW  COMPARISON:  None.  FINDINGS: The bowel gas pattern is normal. No radio-opaque calculi or other significant radiographic abnormality are seen. Stool is noted in the rectum.  IMPRESSION: No evidence of bowel obstruction or ileus.   Electronically Signed   By: Sabino Dick M.D.   On:  08/28/2013 11:32    Medications: I have reviewed the patient's current medications. Scheduled Meds: . amiodarone  200 mg Oral Daily  . antiseptic oral rinse  7 mL Mouth Rinse BID  . aspirin EC  325 mg Oral Daily  . atorvastatin  20 mg Oral q1800  . darbepoetin (ARANESP) injection - DIALYSIS  25 mcg Intravenous Q Thu-HD  . docusate sodium  200 mg Oral Daily  . enoxaparin (LOVENOX) injection  30 mg Subcutaneous Q24H  . ferric gluconate (FERRLECIT/NULECIT) IV  62.5 mg Intravenous Q Thu-HD  . insulin aspart  0-24 Units Subcutaneous TID AC & HS  . metoprolol tartrate  12.5 mg Oral BID  . pantoprazole  40 mg Oral QAC breakfast  . polyethylene glycol  17 g Oral Daily  . sevelamer carbonate  1,600 mg Oral TID WC  . sodium chloride  3 mL Intravenous Q12H  . tamsulosin  0.4 mg Oral QHS   Continuous Infusions:  PRN Meds:.sodium chloride, bisacodyl, bisacodyl, dextrose, guaiFENesin, levalbuterol, oxyCODONE, sodium chloride, traMADol  Assessment/Plan: 11 Days Post-Op Procedure(s) (LRB):  CORONARY ARTERY BYPASS GRAFTING (CABG) times 5 using left internal mammary artery to LAD, saphenous vein graft to diagonal; sequential saphenous vein graft to intermediate and distal circumflex; and saphenous vein graft to right coronary artery (N/A)  INTRAOPERATIVE TRANSESOPHAGEAL ECHOCARDIOGRAM (N/A)  ENDOVEIN HARVEST OF GREATER SAPHENOUS VEIN from left thigh and calf (Left)   1. NSTEMI/ CAD: s/p CABG x5. Continue ASA, BB, statin. BP too soft to tolerate an ACE/ARB   2. Atrial Flutter: Has been in SR > 48 hr. HR in the low 80s. Continue PO amiodarone and Lopressor for rhythm/rate control.   3. ESRD: HD per nephrology -HD yesterday post weight 71.3 kg  TTS- for HD tomorrow  4. Anemia: per TCTS, Nephrology. Hgb 08/20 7.4. On aranesp  5. Systolic + Diastolic CHF: EF 54%. Volume removal during HD, per nephrology.   6. Left PTX: per TCTS.  no pntx on CXR but has + air leak   7. Anticoagulation: His  CHADs2VASC score is 4, he would be an anticoagulation candidate. Currently on DVT Lovenox and ASA 325 mg. Will defer decision to TCTS.   8. DM per TCTS  Pt has follow up with cardiology on the 9th   LOS: 17 days   Time spent with pt. :15 minutes. [facility] R  Nurse Practitioner Certified Pager [phone] or after 5pm and on weekends call [phone] 08/30/2013, 10:31 AM   I have examined the patient and reviewed assessment and plan and discussed with patient.  Agree with above as stated.  Maintaining NSR.  Still has chest tube.  Aranesp for anemia.  No coumadin at this point.  Bernie Ransford S.

## 2013-08-30 NOTE — Progress Notes (Addendum)
11 Days Post-Op Procedure(s) (LRB): CORONARY ARTERY BYPASS GRAFTING (CABG) times 5 using left internal mammary artery to LAD, saphenous vein graft to diagonal; sequential saphenous vein graft to intermediate and distal circumflex; and saphenous vein graft to right coronary artery  (N/A) INTRAOPERATIVE TRANSESOPHAGEAL ECHOCARDIOGRAM (N/A) ENDOVEIN HARVEST OF GREATER SAPHENOUS VEIN from left thigh and calf (Left) Subjective: Feels ok, no abd discomfort, + Flatus but no BM, not SOB  Objective: Vital signs in last 24 hours: Temp:  [97.7 F (36.5 C)-98.5 F (36.9 C)] 98.1 F (36.7 C) (08/21 0618) Pulse Rate:  [80-104] 90 (08/21 0618) Cardiac Rhythm:  [-] Normal sinus rhythm (08/20 1205) Resp:  [17-19] 18 (08/21 0618) BP: (92-122)/(51-96) 118/66 mmHg (08/21 0618) SpO2:  [95 %-100 %] 98 % (08/21 0618) Weight:  [156 lb 15.5 oz (71.2 kg)-165 lb 12.6 oz (75.2 kg)] 157 lb 3.2 oz (71.305 kg) (08/21 0618)  Hemodynamic parameters for last 24 hours:    Intake/Output from previous day: 08/20 0701 - 08/21 0700 In: 480 [P.O.:480] Out: 1435 [Chest Tube:100] Intake/Output this shift:    General appearance: alert, cooperative and no distress Heart: regular rate and rhythm Lungs: clear to auscultation bilaterally Abdomen: benign Extremities: minor edema Wound: incis healing well  Lab Results:  Recent Labs  08/27/13 1531 08/29/13 0813  WBC 10.8* 6.9  HGB 7.8* 7.4*  HCT 23.0* 21.9*  PLT 402* 434*   BMET:  Recent Labs  08/27/13 1532 08/29/13 0813  NA 135* 140  K 4.1 4.0  CL 90* 99  CO2 29 26  GLUCOSE 120* 133*  BUN 56* 30*  CREATININE 11.08* 7.69*  CALCIUM 9.5 9.3    PT/INR: No results found for this basename: LABPROT, INR,  in the last 72 hours ABG    Component Value Date/Time   PHART 7.358 08/19/2013 1958   HCO3 23.5 08/19/2013 1958   TCO2 24 08/20/2013 1640   ACIDBASEDEF 2.0 08/19/2013 1958   O2SAT 99.0 08/19/2013 1958   CBG (last 3)   Recent Labs  08/29/13 1631  08/29/13 2131 08/30/13 0618  GLUCAP 145* 101* 98    Meds Scheduled Meds: . amiodarone  200 mg Oral Daily  . antiseptic oral rinse  7 mL Mouth Rinse BID  . aspirin EC  325 mg Oral Daily  . atorvastatin  20 mg Oral q1800  . darbepoetin (ARANESP) injection - DIALYSIS  25 mcg Intravenous Q Thu-HD  . docusate sodium  200 mg Oral Daily  . enoxaparin (LOVENOX) injection  30 mg Subcutaneous Q24H  . ferric gluconate (FERRLECIT/NULECIT) IV  62.5 mg Intravenous Q Thu-HD  . insulin aspart  0-24 Units Subcutaneous TID AC & HS  . metoprolol tartrate  12.5 mg Oral BID  . pantoprazole  40 mg Oral QAC breakfast  . polyethylene glycol  17 g Oral Daily  . sevelamer carbonate  1,600 mg Oral TID WC  . sodium chloride  3 mL Intravenous Q12H  . tamsulosin  0.4 mg Oral QHS   Continuous Infusions:  PRN Meds:.sodium chloride, bisacodyl, bisacodyl, dextrose, guaiFENesin, levalbuterol, oxyCODONE, sodium chloride, traMADol  Xrays Dg Chest Port 1 View  08/29/2013   CLINICAL DATA:  Chest tube.  EXAM: PORTABLE CHEST - 1 VIEW  COMPARISON:  08/27/2013.  FINDINGS: Left chest tube noted in good anatomic position. Stable left pneumothorax. Right lung is clear. Stable cardiomegaly. Prior CABG. No CHF.  IMPRESSION: 1. Left chest tube in stable position. Stable small left pneumothorax. 2. Stable cardiomegaly.  Prior CABG.  No CHF.  Electronically Signed   By: Marcello Moores  Register   On: 08/29/2013 13:50   Dg Abd Portable 1v  08/28/2013   CLINICAL DATA:  Lack of bowel movement.  EXAM: PORTABLE ABDOMEN - 1 VIEW  COMPARISON:  None.  FINDINGS: The bowel gas pattern is normal. No radio-opaque calculi or other significant radiographic abnormality are seen. Stool is noted in the rectum.  IMPRESSION: No evidence of bowel obstruction or ileus.   Electronically Signed   By: Sabino Dick M.D.   On: 08/28/2013 11:32    Assessment/Plan: S/P Procedure(s) (LRB): CORONARY ARTERY BYPASS GRAFTING (CABG) times 5 using left internal mammary  artery to LAD, saphenous vein graft to diagonal; sequential saphenous vein graft to intermediate and distal circumflex; and saphenous vein graft to right coronary artery  (N/A) INTRAOPERATIVE TRANSESOPHAGEAL ECHOCARDIOGRAM (N/A) ENDOVEIN HARVEST OF GREATER SAPHENOUS VEIN from left thigh and calf (Left)  1 no pntx on CXR but has + air leak 2 no dialysis today- tx as per nephrology 3 cont to follow H/H closely , is on aranesp     LOS: 17 days    [name] [name],[name] [name] 08/30/2013  Chest xray improved, still with air leak off suction I have seen and examined [name] [name] and agree with the above assessment  and plan.  [name] MD Beeper [phone] Office [phone] 08/30/2013 3:56 PM

## 2013-08-30 NOTE — Progress Notes (Signed)
1405 Card[facility] Re[facility]  Attempted to get to walk for second walk today. He c/o of loose stools and not being able to get far from the bathroom.  Encouraged him to try later if bowel movements slow up. We will follow pt tomorrow. Deon Pilling, RN 08/30/2013 2:15 PM

## 2013-08-30 NOTE — Progress Notes (Signed)
  [facility] Progress Note   Subjective: no new issues. Still with air leak.  HD yesterday, post weight 71.3kg  Filed Vitals:   08/29/13 1253 08/29/13 1407 08/29/13 2101 08/30/13 0618  BP: 115/82 104/51 112/56 118/66  Pulse: 92 96 88 90  Temp: 98.5 F (36.9 C) 98.3 F (36.8 C) 98.2 F (36.8 C) 98.1 F (36.7 C)  TempSrc:  Oral Oral Oral  Resp: 18 19 18 18   Height:      Weight:    71.305 kg (157 lb 3.2 oz)  SpO2: 98% 100% 99% 98%   Exam: Alert, no distress, calm in chair Chest is clear bilat RRR  Abd soft, NTND Mild edema LE's Neuro is alert and Ox 3 Left arm AVF +bruit  HD: [facility] 4h   F180   400/800   2/2.0 Bath   73kg  Heparin 4000   L arm AVF Calcitriol 0.5 ug TIW, Venofer 50 mg q Thursday  pth 1000, P 5.6  tsat 16%  Hb 12     Assessment: 1 NSTEMI / CAD- s/p CABG 8/11 2 ESRD on HD TTS 3 HTN/ vol - just over dry wt, stable,  4 Anemia started aranesp 25 ug/wk, Stable with some lability, monitor 5 HPTH/BMD holding vit D for ^Ca, cont binders (renvela 2ac) and 2Ca bath 6 DM per primary 7 Aflutter - back in NSR, on amio and MTP 8 Hypotension- stable/resolved  Plan- Next HD tomorrow.  Target weight 71.0kg.  Check Iron levels    Recent Labs Lab 08/26/13 0047 08/27/13 1532 08/29/13 0813  NA 138 135* 140  K 3.7 4.1 4.0  CL 94* 90* 99  CO2 30 29 26   GLUCOSE 97 120* 133*  BUN 38* 56* 30*  CREATININE 7.62* 11.08* 7.69*  CALCIUM 9.7 9.5 9.3  PHOS 4.9* 5.9* 4.0    Recent Labs Lab 08/26/13 0047 08/27/13 1532 08/29/13 0813  ALBUMIN 2.5* 2.7* 2.5*    Recent Labs Lab 08/26/13 0047 08/27/13 1531 08/29/13 0813  WBC 9.1 10.8* 6.9  HGB 7.6* 7.8* 7.4*  HCT 22.8* 23.0* 21.9*  MCV 93.8 93.5 92.8  PLT 333 402* 434*   . amiodarone  200 mg Oral Daily  . antiseptic oral rinse  7 mL Mouth Rinse BID  . aspirin EC  325 mg Oral Daily  . atorvastatin  20 mg Oral q1800  . darbepoetin (ARANESP) injection - DIALYSIS  25 mcg Intravenous Q Thu-HD   . docusate sodium  200 mg Oral Daily  . enoxaparin (LOVENOX) injection  30 mg Subcutaneous Q24H  . ferric gluconate (FERRLECIT/NULECIT) IV  62.5 mg Intravenous Q Thu-HD  . insulin aspart  0-24 Units Subcutaneous TID AC & HS  . metoprolol tartrate  12.5 mg Oral BID  . pantoprazole  40 mg Oral QAC breakfast  . polyethylene glycol  17 g Oral Daily  . sevelamer carbonate  1,600 mg Oral TID WC  . sodium chloride  3 mL Intravenous Q12H  . tamsulosin  0.4 mg Oral QHS     sodium chloride, bisacodyl, bisacodyl, dextrose, guaiFENesin, levalbuterol, oxyCODONE, sodium chloride, traMADol

## 2013-08-31 ENCOUNTER — Inpatient Hospital Stay (HOSPITAL_COMMUNITY): Payer: Medicare Other

## 2013-08-31 LAB — GLUCOSE, CAPILLARY
Glucose-Capillary: 100 mg/dL — ABNORMAL HIGH (ref 70–99)
Glucose-Capillary: 80 mg/dL (ref 70–99)
Glucose-Capillary: 122 mg/dL — ABNORMAL HIGH (ref 70–99)
Glucose-Capillary: 101 mg/dL — ABNORMAL HIGH (ref 70–99)

## 2013-08-31 LAB — RENAL FUNCTION PANEL
Albumin: 2.8 g/dL — ABNORMAL LOW (ref 3.5–5.2)
Anion gap: 16 — ABNORMAL HIGH (ref 5–15)
BUN: 27 mg/dL — AB (ref 6–23)
CALCIUM: 9.8 mg/dL (ref 8.4–10.5)
CO2: 26 meq/L (ref 19–32)
CREATININE: 8.48 mg/dL — AB (ref 0.50–1.35)
Chloride: 91 mEq/L — ABNORMAL LOW (ref 96–112)
GFR calc non Af Amer: 6 mL/min — ABNORMAL LOW (ref >90)
GFR, EST AFRICAN AMERICAN: 7 mL/min — AB (ref >90)
Glucose, Bld: 107 mg/dL — ABNORMAL HIGH (ref 70–99)
Phosphorus: 3.6 mg/dL (ref 2.3–4.6)
Potassium: 4.5 mEq/L (ref 3.7–5.3)
Sodium: 133 mEq/L — ABNORMAL LOW (ref 137–147)

## 2013-08-31 LAB — CBC
HCT: 22.7 % — ABNORMAL LOW (ref 39.0–52.0)
HEMOGLOBIN: 7.8 g/dL — AB (ref 13.0–17.0)
MCH: 31.7 pg (ref 26.0–34.0)
MCHC: 34.4 g/dL (ref 30.0–36.0)
MCV: 92.3 fL (ref 78.0–100.0)
Platelets: 488 10*3/uL — ABNORMAL HIGH (ref 150–400)
RBC: 2.46 MIL/uL — AB (ref 4.22–5.81)
RDW: 14.9 % (ref 11.5–15.5)
WBC: 15.4 10*3/uL — ABNORMAL HIGH (ref 4.0–10.5)

## 2013-08-31 MED ORDER — SODIUM CHLORIDE 0.9 % IV SOLN
125.0000 mg | INTRAVENOUS | Status: DC
  Administered 2013-08-31 – 2013-09-05 (×3): 125 mg via INTRAVENOUS
  Filled 2013-08-31 (×6): qty 10

## 2013-08-31 NOTE — Progress Notes (Signed)
[place] on pt at 1255 to walk after HD. He stated he had not eaten and he did not know how his blood sugar was. Had NT check CBG and it was 80. Pt declined walk awaiting tray. Returned now at 1345 and pt stated he had had cereal but was seeing some spots. Took BP at 93/48. Gave pt fresh water to drink. Encouraged him to walk with staff later and he stated he would if he felt better. NSR 98.  Graylon Good RN BSN 08/31/2013 2:00 PM

## 2013-08-31 NOTE — Procedures (Signed)
I was present at this dialysis session. I have reviewed the session itself and made appropriate changes.   Pt doing well and w/o complaint this AM.  WBC up to 15. Afebrile. BP ok.  Has had nonproductive cough x2d.    Hb slowly increasing, Fe studies c/w relative IDA.  Start Fe load at HD.  Pearson Grippe  MD 08/31/2013, 7:45 AM

## 2013-08-31 NOTE — Progress Notes (Addendum)
       [address]       [place] [address]             [phone]          12 Days Post-Op Procedure(s) (LRB): CORONARY ARTERY BYPASS GRAFTING (CABG) times 5 using left internal mammary artery to LAD, saphenous vein graft to diagonal; sequential saphenous vein graft to intermediate and distal circumflex; and saphenous vein graft to right coronary artery  (N/A) INTRAOPERATIVE TRANSESOPHAGEAL ECHOCARDIOGRAM (N/A) ENDOVEIN HARVEST OF GREATER SAPHENOUS VEIN from left thigh and calf (Left)  Subjective: Seen on HD unit, feels well, no complaints.  Had a BM yesterday.  Objective: Vital signs in last 24 hours: Patient Vitals for the past 24 hrs:  BP Temp Temp src Pulse Resp SpO2 Weight  08/31/13 0509 118/60 mmHg 98.8 F (37.1 C) Oral 88 21 98 % 157 lb 11.2 oz (71.532 kg)  08/30/13 2013 - 97.7 F (36.5 C) Oral - - - -  08/30/13 2010 134/66 mmHg - - 88 20 - -   Current Weight  08/31/13 157 lb 11.2 oz (71.532 kg)     Intake/Output from previous day: 08/21 0701 - 08/22 0700 In: 300 [P.O.:300] Out: -     PHYSICAL EXAM:  Heart: RRR Lungs: Clear Wound: Clean and dry Extremities: Mild edema Chest tube: 1-2/7 air leak    Lab Results: CBC: Recent Labs  08/29/13 0813 08/31/13 0415  WBC 6.9 15.4*  HGB 7.4* 7.8*  HCT 21.9* 22.7*  PLT 434* 488*   BMET:  Recent Labs  08/29/13 0813  NA 140  K 4.0  CL 99  CO2 26  GLUCOSE 133*  BUN 30*  CREATININE 7.69*  CALCIUM 9.3    PT/INR: No results found for this basename: LABPROT, INR,  in the last 72 hours  CXR: FINDINGS:  Borderline cardiomegaly. Status post CABG. Right lung is clear. Left  chest tube is unchanged in position. Slight increase left upper and  lateral pneumothorax measures about 15%.  IMPRESSION:  Stable left chest tube position. Slight increased left pneumothorax  measures about about 15%   Assessment/Plan: S/P Procedure(s) (LRB): CORONARY ARTERY BYPASS GRAFTING (CABG) times 5  using left internal mammary artery to LAD, saphenous vein graft to diagonal; sequential saphenous vein graft to intermediate and distal circumflex; and saphenous vein graft to right coronary artery  (N/A) INTRAOPERATIVE TRANSESOPHAGEAL ECHOCARDIOGRAM (N/A) ENDOVEIN HARVEST OF GREATER SAPHENOUS VEIN from left thigh and calf (Left) L ptx slightly increased on CXR this am, + stable air leak with cough. Will leave CT on water seal for now and repeat CXR in am.  CV- stable, continue current meds. ESRD- on HD, per renal. Anemia- continue Aranesp. H/H generally stable. Leukocytosis, no fever.  Will watch.   LOS: 18 days    COLLINS,GINA H 08/31/2013   Chart reviewed, patient examined, agree with above. He has a slightly larger but still small left ptx on cxr. The air leak with coughing clears after a few coughs so I think his air leak is small and the air coming out with coughing is the slowly accumulating ptx. This should stop on its own. I would keep on water seal and consider conversion to a mini-express.

## 2013-09-01 ENCOUNTER — Inpatient Hospital Stay (HOSPITAL_COMMUNITY): Payer: Medicare Other

## 2013-09-01 LAB — GLUCOSE, CAPILLARY
GLUCOSE-CAPILLARY: 95 mg/dL (ref 70–99)
GLUCOSE-CAPILLARY: 144 mg/dL — AB (ref 70–99)
Glucose-Capillary: 102 mg/dL — ABNORMAL HIGH (ref 70–99)

## 2013-09-01 NOTE — Progress Notes (Signed)
  [facility] Progress Note   Subjective: no new issues, still small air leak w/ CT.  HD yesterday post weight 70.5kg.  Pt w/o complaint. Afebrile  Filed Vitals:   08/31/13 1135 08/31/13 1453 08/31/13 2110 09/01/13 0514  BP: 125/82 97/50 95/56  104/54  Pulse: 94 90 90 90  Temp: 97.7 F (36.5 C) 98.2 F (36.8 C) 99.3 F (37.4 C) 99.1 F (37.3 C)  TempSrc: Oral Oral Oral Oral  Resp: 20 20 19 19   Height:      Weight: 70.5 kg (155 lb 6.8 oz)   70 kg (154 lb 5.2 oz)  SpO2: 95%  99% 95%   Exam: Alert, no distress, calm in chair Chest is clear bilat RRR  Abd soft, NTND Mild edema LE's Neuro is alert and Ox 3 Left arm AVF +bruit  HD: [facility] 4h   F180   400/800   2/2.0 Bath   73kg  Heparin 4000   L arm AVF Calcitriol 0.5 ug TIW, Venofer 50 mg q Thursday  pth 1000, P 5.6  tsat 16%  Hb 12     Assessment: 1 NSTEMI / CAD- s/p CABG 8/11 2 ESRD on HD TTS,cont on schedule 3 HTN/ vol - target EDW 71kg for now 4 Anemia started aranesp 25 ug/wk, Low TSAT, started Fe loading 08/31/13; improving/monitor 5 HPTH/BMD holding vit D for ^Ca, cont binders (renvela 2ac) and 2Ca bath 6 DM per primary 7 Aflutter - back in NSR, on amio and MTP, cardiology following 8 Hypotension- stable/resolved  Plan- Next HD Tuesday.  Target weight 71.0kg.  Cont Fe load.      Recent Labs Lab 08/27/13 1532 08/29/13 0813 08/31/13 0741  NA 135* 140 133*  K 4.1 4.0 4.5  CL 90* 99 91*  CO2 29 26 26   GLUCOSE 120* 133* 107*  BUN 56* 30* 27*  CREATININE 11.08* 7.69* 8.48*  CALCIUM 9.5 9.3 9.8  PHOS 5.9* 4.0 3.6    Recent Labs Lab 08/27/13 1532 08/29/13 0813 08/31/13 0741  ALBUMIN 2.7* 2.5* 2.8*    Recent Labs Lab 08/27/13 1531 08/29/13 0813 08/31/13 0415  WBC 10.8* 6.9 15.4*  HGB 7.8* 7.4* 7.8*  HCT 23.0* 21.9* 22.7*  MCV 93.5 92.8 92.3  PLT 402* 434* 488*   . amiodarone  200 mg Oral Daily  . antiseptic oral rinse  7 mL Mouth Rinse BID  . aspirin EC  325 mg Oral Daily   . atorvastatin  20 mg Oral q1800  . darbepoetin (ARANESP) injection - DIALYSIS  25 mcg Intravenous Q Thu-HD  . docusate sodium  200 mg Oral Daily  . enoxaparin (LOVENOX) injection  30 mg Subcutaneous Q24H  . ferric gluconate (FERRLECIT/NULECIT) IV  125 mg Intravenous Q T,Th,Sa-HD  . insulin aspart  0-24 Units Subcutaneous TID AC & HS  . metoprolol tartrate  12.5 mg Oral BID  . pantoprazole  40 mg Oral QAC breakfast  . polyethylene glycol  17 g Oral Daily  . sevelamer carbonate  1,600 mg Oral TID WC  . sodium chloride  3 mL Intravenous Q12H  . tamsulosin  0.4 mg Oral QHS     sodium chloride, bisacodyl, bisacodyl, dextrose, guaiFENesin, levalbuterol, oxyCODONE, sodium chloride, traMADol

## 2013-09-01 NOTE — Progress Notes (Addendum)
[address]       [place] [address]             [phone]          13 Days Post-Op Procedure(s) (LRB): CORONARY ARTERY BYPASS GRAFTING (CABG) times 5 using left internal mammary artery to LAD, saphenous vein graft to diagonal; sequential saphenous vein graft to intermediate and distal circumflex; and saphenous vein graft to right coronary artery  (N/A) INTRAOPERATIVE TRANSESOPHAGEAL ECHOCARDIOGRAM (N/A) ENDOVEIN HARVEST OF GREATER SAPHENOUS VEIN from left thigh and calf (Left)  Subjective: Comfortable, no complaints.     Objective: Vital signs in last 24 hours: Patient Vitals for the past 24 hrs:  BP Temp Temp src Pulse Resp SpO2 Weight  09/01/13 0514 104/54 mmHg 99.1 F (37.3 C) Oral 90 19 95 % 154 lb 5.2 oz (70 kg)  08/31/13 2110 95/56 mmHg 99.3 F (37.4 C) Oral 90 19 99 % -  08/31/13 1453 97/50 mmHg 98.2 F (36.8 C) Oral 90 20 - -  08/31/13 1135 125/82 mmHg 97.7 F (36.5 C) Oral 94 20 95 % 155 lb 6.8 oz (70.5 kg)  08/31/13 1130 125/63 mmHg - - 82 20 - -  08/31/13 1100 124/61 mmHg - - 83 20 - -  08/31/13 1000 125/79 mmHg - - 84 19 - -  08/31/13 0930 118/72 mmHg - - 85 20 - -  08/31/13 0900 109/66 mmHg - - 85 17 - -   Current Weight  09/01/13 154 lb 5.2 oz (70 kg)     Intake/Output from previous day: 08/22 0701 - 08/23 0700 In: -  Out: 1000     PHYSICAL EXAM:  Heart: RRR Lungs: Clear Wound: Clean and dry Extremities: Mild LE edema Chest tube:+ air leak    Lab Results: CBC: Recent Labs  08/31/13 0415  WBC 15.4*  HGB 7.8*  HCT 22.7*  PLT 488*   BMET:  Recent Labs  08/31/13 0741  NA 133*  K 4.5  CL 91*  CO2 26  GLUCOSE 107*  BUN 27*  CREATININE 8.48*  CALCIUM 9.8    PT/INR: No results found for this basename: LABPROT, INR,  in the last 72 hours  CXR: FINDINGS:  The left-sided pneumothorax is not visible today. The known left  chest tube tip overlies the posterior aspect of the fourth rib.  There is no  pleural effusion. The right lung is clear. The heart is  top-normal in size. The pulmonary vascularity is not engorged.  IMPRESSION:  The previously demonstrated left pneumothorax is not visible today.  The left chest tube is unchanged in position   Assessment/Plan: S/P Procedure(s) (LRB): CORONARY ARTERY BYPASS GRAFTING (CABG) times 5 using left internal mammary artery to LAD, saphenous vein graft to diagonal; sequential saphenous vein graft to intermediate and distal circumflex; and saphenous vein graft to right coronary artery  (N/A) INTRAOPERATIVE TRANSESOPHAGEAL ECHOCARDIOGRAM (N/A) ENDOVEIN HARVEST OF GREATER SAPHENOUS VEIN from left thigh and calf (Left) L ptx- CXR with no evidence ptx today, CT with stable air leak.  Continue CT to water seal, possibly switch to Mini Express. CV- Maintaining SR, BPs stable. Continue current meds. ESRD- for HD Tuesday. Leukocytosis, no fever. Will recheck labs in am. CRPI, ambulation. Anemia- Continue Aranesp.    LOS: 19 days    COLLINS,GINA H 09/01/2013   Chart reviewed, patient examined, agree with above. CXR looks ok with no ptx. Air leak is the same with cough and then clears.  Will discuss putting on mini express with Dr. Servando Snare.

## 2013-09-02 ENCOUNTER — Inpatient Hospital Stay (HOSPITAL_COMMUNITY): Payer: Medicare Other

## 2013-09-02 LAB — BASIC METABOLIC PANEL
ANION GAP: 15 (ref 5–15)
BUN: 26 mg/dL — ABNORMAL HIGH (ref 6–23)
CO2: 29 mEq/L (ref 19–32)
CREATININE: 8.95 mg/dL — AB (ref 0.50–1.35)
Calcium: 9.8 mg/dL (ref 8.4–10.5)
Chloride: 93 mEq/L — ABNORMAL LOW (ref 96–112)
GFR calc Af Amer: 6 mL/min — ABNORMAL LOW (ref >90)
GFR, EST NON AFRICAN AMERICAN: 5 mL/min — AB (ref >90)
Glucose, Bld: 103 mg/dL — ABNORMAL HIGH (ref 70–99)
Potassium: 4 mEq/L (ref 3.7–5.3)
SODIUM: 137 meq/L (ref 137–147)

## 2013-09-02 LAB — CBC
HCT: 24.5 % — ABNORMAL LOW (ref 39.0–52.0)
Hemoglobin: 8.2 g/dL — ABNORMAL LOW (ref 13.0–17.0)
MCH: 30.8 pg (ref 26.0–34.0)
MCHC: 33.5 g/dL (ref 30.0–36.0)
MCV: 92.1 fL (ref 78.0–100.0)
PLATELETS: 515 10*3/uL — AB (ref 150–400)
RBC: 2.66 MIL/uL — ABNORMAL LOW (ref 4.22–5.81)
RDW: 14.8 % (ref 11.5–15.5)
WBC: 12.2 10*3/uL — AB (ref 4.0–10.5)

## 2013-09-02 LAB — GLUCOSE, CAPILLARY
GLUCOSE-CAPILLARY: 31 mg/dL — AB (ref 70–99)
GLUCOSE-CAPILLARY: 113 mg/dL — AB (ref 70–99)
GLUCOSE-CAPILLARY: 146 mg/dL — AB (ref 70–99)
Glucose-Capillary: 97 mg/dL (ref 70–99)
Glucose-Capillary: 137 mg/dL — ABNORMAL HIGH (ref 70–99)

## 2013-09-02 NOTE — Progress Notes (Addendum)
      [address]       [place] [address]             [phone]      14 Days Post-Op Procedure(s) (LRB): CORONARY ARTERY BYPASS GRAFTING (CABG) times 5 using left internal mammary artery to LAD, saphenous vein graft to diagonal; sequential saphenous vein graft to intermediate and distal circumflex; and saphenous vein graft to right coronary artery  (N/A) INTRAOPERATIVE TRANSESOPHAGEAL ECHOCARDIOGRAM (N/A) ENDOVEIN HARVEST OF GREATER SAPHENOUS VEIN from left thigh and calf (Left)  Subjective:  [name] [name] states he is doing fine this morning.  He does state that he has some nasal congestion on the right side and occasional runny nose on his left when he is up walking.    Objective: Vital signs in last 24 hours: Temp:  [98 F (36.7 C)-98.5 F (36.9 C)] 98 F (36.7 C) (08/24 0616) Pulse Rate:  [84-87] 87 (08/24 0616) Cardiac Rhythm:  [-] Heart block;Normal sinus rhythm (08/23 2000) Resp:  [18-19] 18 (08/24 0616) BP: (94-95)/(52-54) 94/54 mmHg (08/24 0616) SpO2:  [98 %-99 %] 99 % (08/24 0616) Weight:  [154 lb 15.7 oz (70.3 kg)] 154 lb 15.7 oz (70.3 kg) (08/24 0616)  General appearance: alert, cooperative and no distress Heart: regular rate and rhythm Lungs: clear to auscultation bilaterally Abdomen: soft, non-tender; bowel sounds normal; no masses,  no organomegaly Extremities: extremities normal, atraumatic, no cyanosis or edema Wound: clean and dry  Lab Results:  Recent Labs  08/31/13 0415 09/02/13 0416  WBC 15.4* 12.2*  HGB 7.8* 8.2*  HCT 22.7* 24.5*  PLT 488* 515*   BMET:  Recent Labs  08/31/13 0741 09/02/13 0416  NA 133* 137  K 4.5 4.0  CL 91* 93*  CO2 26 29  GLUCOSE 107* 103*  BUN 27* 26*  CREATININE 8.48* 8.95*  CALCIUM 9.8 9.8    PT/INR: No results found for this basename: LABPROT, INR,  in the last 72 hours ABG    Component Value Date/Time   PHART 7.358 08/19/2013 1958   HCO3 23.5 08/19/2013 1958   TCO2 24 08/20/2013 1640   ACIDBASEDEF 2.0 08/19/2013 1958   O2SAT 99.0 08/19/2013 1958   CBG (last 3)   Recent Labs  09/01/13 1141 09/01/13 2057 09/02/13 0614  GLUCAP 144* 102* 113*    Assessment/Plan: S/P Procedure(s) (LRB): CORONARY ARTERY BYPASS GRAFTING (CABG) times 5 using left internal mammary artery to LAD, saphenous vein graft to diagonal; sequential saphenous vein graft to intermediate and distal circumflex; and saphenous vein graft to right coronary artery  (N/A) INTRAOPERATIVE TRANSESOPHAGEAL ECHOCARDIOGRAM (N/A) ENDOVEIN HARVEST OF GREATER SAPHENOUS VEIN from left thigh and calf (Left)  1. CV- remains hemodynamically stable 2. Pulm- chest tube with continued air leak, CXR shows new development of a 15% pneumothorax, however chest tube appears to have moved out a little bit.   3. Renal- ESRD, HD tomorrow, nephrology following 4. Dispo- re development of pneumothorax 15% on left, I pushed tube back in, redressed with Petroleum gauze and tight dressing, will repeat CXR in AM   LOS: 20 days    [name] [name], [name] [name] 09/02/2013  Xray yesterday showed no PTX was considering mini express and d/c home with tube but now back with apical ptx, will put back to suction I have seen and examined [name] [name] and agree with the above assessment  and plan.  [name] MD Beeper [phone] Office [phone] 09/02/2013 11:12 AM

## 2013-09-02 NOTE — Progress Notes (Signed)
CARDIAC REHAB PHASE I   PRE:  Rate/Rhythm: 87 SR  BP:  Supine:   Sitting: 123/56  Standing:    SaO2: 97 RA  MODE:  Ambulation: 690 ft   POST:  Rate/Rhythm: 83 SR  BP:  Supine:   Sitting: 111/52   Standing:    SaO2: 98 RA 0940-1005 Assisted X 1 and used walker to ambulate. Gait steady with walker. Pt walked 690 feet without c/o. VS stable. Pt back to recliner after walk with call light in reach.  Rodney Langton RN 09/02/2013 10:04 AM

## 2013-09-02 NOTE — Progress Notes (Signed)
Per MD verbal order place pt back on suction.  Rowe Pavy, RN

## 2013-09-02 NOTE — Progress Notes (Signed)
Pt ambulated 269ft with front wheel rolling walker; pt tolerated walk well; pt is in new room 2w03; up in chair; call bell within reach.  Rowe Pavy, RN

## 2013-09-02 NOTE — Progress Notes (Signed)
Subjective:   No complaints, no dyspnea, with chest tube  Objective: Vital signs in last 24 hours: Temp:  [98 F (36.7 C)-98.5 F (36.9 C)] 98 F (36.7 C) (08/24 0616) Pulse Rate:  [84-87] 85 (08/24 0936) Resp:  [18-19] 18 (08/24 0616) BP: (94-104)/(52-59) 104/59 mmHg (08/24 0936) SpO2:  [98 %-99 %] 99 % (08/24 0616) Weight:  [70.3 kg (154 lb 15.7 oz)] 70.3 kg (154 lb 15.7 oz) (08/24 0616) Weight change: -1.5 kg (-3 lb 4.9 oz)  Intake/Output from previous day:   Intake/Output this shift: Total I/O In: 240 [P.O.:240] Out: -   Lab Results:  Recent Labs  08/31/13 0415 09/02/13 0416  WBC 15.4* 12.2*  HGB 7.8* 8.2*  HCT 22.7* 24.5*  PLT 488* 515*   BMET:  Recent Labs  08/31/13 0741 09/02/13 0416  NA 133* 137  K 4.5 4.0  CL 91* 93*  CO2 26 29  GLUCOSE 107* 103*  BUN 27* 26*  CREATININE 8.48* 8.95*  CALCIUM 9.8 9.8  ALBUMIN 2.8*  --    No results found for this basename: PTH,  in the last 72 hours Iron Studies:  Recent Labs  08/30/13 1235  IRON 26*  TIBC 232    Studies/Results: Dg Chest Port 1 View  09/01/2013   CLINICAL DATA:  Left-sided pneumothorax  EXAM: PORTABLE CHEST - 1 VIEW  COMPARISON:  Portable chest x-ray of August 31, 2013  FINDINGS: The left-sided pneumothorax is not visible today. The known left chest tube tip overlies the posterior aspect of the fourth rib. There is no pleural effusion. The right lung is clear. The heart is top-normal in size. The pulmonary vascularity is not engorged.  IMPRESSION: The previously demonstrated left pneumothorax is not visible today. The left chest tube is unchanged in position.   Electronically Signed   By: David  Martinique   On: 09/01/2013 07:51   EXAM: General appearance:  Alert, in no apparent distress Resp:  CTA without rales, rhonchi, or wheezes, with chest tube Cardio:  RRR without murmur or rub GI:  + BS, soft and nontender Extremities:  No edema Access:  AVF @ LUA with + bruit  D[facility]: [facility]  4h  F180 400/800 2/2.0 Bath 73kg Heparin 4000 L arm AVF  Calcitriol 0.5 ug TIW, Venofer 50 mg q Thursday  pth 1000, P 5.6 tsat 16% Hb 12  Assessment/Plan: 1. NSTEMI - s/p CABG x 5 by Dr. Servando Snare 8/11. 2. Pneumothorax - small L pneumothorax 15% per x-ray, with chest tube. 3. ESRD - HD on TTS @ [facility], K 4.  Next HD tomorrow. 4. HTN/Volume - BP 104/59 on Metoprolol 12.5 mg bid; below EDW. 5. Anemia - Hgb up to 8.2, Aranesp 25 mcg on Thurs, Fe loading. 6. Sec HPT - Ca 9.8; Calcitriol 0.5 mcg, Renvela 2 with meals. 7. Nutrition - Renal carb-mod diet, multivitamin. 8. DM - insulin per primary. 9. A-flutter - NSR, Amiodarone & BB.   LOS: 20 days   LYLES,CHARLES 09/02/2013,12:13 PM  Pt seen, examined and agree w A/P as above.  Kelly Splinter MD pager [phone]    cell [phone] 09/02/2013, 4:59 PM

## 2013-09-02 NOTE — Progress Notes (Signed)
    Subjective:  Feels like he is getting stronger. No chest pain or shortness of breath.  Objective:  Vital Signs in the last 24 hours: Temp:  [98 F (36.7 C)-98.5 F (36.9 C)] 98 F (36.7 C) (08/24 0616) Pulse Rate:  [84-87] 87 (08/24 0616) Resp:  [18-19] 18 (08/24 0616) BP: (94-95)/(52-54) 94/54 mmHg (08/24 0616) SpO2:  [98 %-99 %] 99 % (08/24 0616) Weight:  [154 lb 15.7 oz (70.3 kg)] 154 lb 15.7 oz (70.3 kg) (08/24 0616)  Intake/Output from previous day:    Physical Exam: Pt is alert and oriented, NAD HEENT: normal Neck: JVP - normal Lungs: CTA bilaterally CV: RRR without murmur or gallop Abd: soft, NT, Positive BS, no hepatomegaly Ext: no C/C/E Skin: warm/dry no rash   Lab Results:  Recent Labs  08/31/13 0415 09/02/13 0416  WBC 15.4* 12.2*  HGB 7.8* 8.2*  PLT 488* 515*    Recent Labs  08/31/13 0741 09/02/13 0416  NA 133* 137  K 4.5 4.0  CL 91* 93*  CO2 26 29  GLUCOSE 107* 103*  BUN 27* 26*  CREATININE 8.48* 8.95*   No results found for this basename: TROPONINI, CK, MB,  in the last 72 hours  Cardiac Studies: 2D Echo: Study Conclusions  - Left ventricle: Diffuse hypokinesis worse in the inferior wall. The cavity size was moderately dilated. Wall thickness was increased in a pattern of mild LVH. Systolic function was moderately reduced. The estimated ejection fraction was in the range of 35% to 40%. Doppler parameters are consistent with abnormal left ventricular relaxation (grade 1 diastolic dysfunction). - Aortic valve: There was trivial regurgitation. - Mitral valve: There was mild regurgitation. - Left atrium: The atrium was mildly dilated. - Atrial septum: No defect or patent foramen ovale was identified.  Tele: Personally reviewed: Sinus rhythm without recurrence of atrial fibrillation  Assessment/Plan:  1. Atrial flutter, now in sinus rhythm on amiodarone 2. Non-STEMI, status post multivessel CABG postoperative day 14 3. Mixed  systolic and diastolic heart failure. Volume management per hemodialysis. Tolerating low-dose beta blocker. 4. End-stage renal disease 5. Anemia 6. Left-sided pneumothorax, still with chest tube in place  Appears stable from cardiac perspective. Would continue amiodarone and low-dose metoprolol. Blood pressure will not allow for an ACE or ARB. Will arrange followup with Dr. Claiborne Billings at the [facility] office. Suspect he will go to skilled nursing for short-term rehabilitation  Sherren Mocha, M.D. 09/02/2013, 9:31 AM

## 2013-09-02 NOTE — Clinical Social Work Note (Signed)
Clinical Social Worker continuing to follow patient and family for support and discharge planning needs.  Per PA note, "Dispo- re development of pneumothorax 15% on left, I pushed tube back in, redressed with Petroleum gauze and tight dressing, will repeat CXR in AM."  Patient discharge plan is for [facility] when medically stable.  CSW remains available for support and to facilitate patient discharge needs once medically ready.  [name] [name], [name] [name]

## 2013-09-03 ENCOUNTER — Inpatient Hospital Stay (HOSPITAL_COMMUNITY): Payer: Medicare Other

## 2013-09-03 LAB — RENAL FUNCTION PANEL
ALBUMIN: 2.5 g/dL — AB (ref 3.5–5.2)
ANION GAP: 17 — AB (ref 5–15)
BUN: 35 mg/dL — AB (ref 6–23)
CO2: 25 mEq/L (ref 19–32)
CREATININE: 11.08 mg/dL — AB (ref 0.50–1.35)
Calcium: 9.5 mg/dL (ref 8.4–10.5)
Chloride: 90 mEq/L — ABNORMAL LOW (ref 96–112)
GFR calc Af Amer: 5 mL/min — ABNORMAL LOW (ref >90)
GFR calc non Af Amer: 4 mL/min — ABNORMAL LOW (ref >90)
Glucose, Bld: 65 mg/dL — ABNORMAL LOW (ref 70–99)
Phosphorus: 4 mg/dL (ref 2.3–4.6)
Potassium: 4 mEq/L (ref 3.7–5.3)
Sodium: 132 mEq/L — ABNORMAL LOW (ref 137–147)

## 2013-09-03 LAB — GLUCOSE, CAPILLARY
Glucose-Capillary: 85 mg/dL (ref 70–99)
Glucose-Capillary: 127 mg/dL — ABNORMAL HIGH (ref 70–99)
Glucose-Capillary: 96 mg/dL (ref 70–99)
Glucose-Capillary: 162 mg/dL — ABNORMAL HIGH (ref 70–99)

## 2013-09-03 LAB — CBC
HEMATOCRIT: 22.2 % — AB (ref 39.0–52.0)
Hemoglobin: 7.5 g/dL — ABNORMAL LOW (ref 13.0–17.0)
MCH: 30.5 pg (ref 26.0–34.0)
MCHC: 33.8 g/dL (ref 30.0–36.0)
MCV: 90.2 fL (ref 78.0–100.0)
PLATELETS: 481 10*3/uL — AB (ref 150–400)
RBC: 2.46 MIL/uL — ABNORMAL LOW (ref 4.22–5.81)
RDW: 14.4 % (ref 11.5–15.5)
WBC: 10.5 10*3/uL (ref 4.0–10.5)

## 2013-09-03 MED ORDER — NAPHAZOLINE HCL 0.1 % OP SOLN
1.0000 [drp] | Freq: Four times a day (QID) | OPHTHALMIC | Status: DC | PRN
  Filled 2013-09-03: qty 15

## 2013-09-03 MED ORDER — SALINE SPRAY 0.65 % NA SOLN
1.0000 | NASAL | Status: DC | PRN
  Filled 2013-09-03 (×2): qty 44

## 2013-09-03 NOTE — Progress Notes (Signed)
Inpatient Diabetes Program Recommendations  AACE/ADA: New Consensus Statement on Inpatient Glycemic Control (2013)  Target Ranges:  Prepandial:   less than 140 mg/dL      Peak postprandial:   less than 180 mg/dL (1-2 hours)      Critically ill patients:  140 - 180 mg/dL  Results for [name] [name], [name] (MRN [id number]) as of 09/03/2013 12:59  Ref. Range 09/03/2013 04:10  Glucose Latest Range: 70-99 mg/dL 65 (L)   Patient with hypoglycemia this morning lab glucose 65. Consider discontinuing the current Novolog scale and use the sensitive scale per the Glycemic Control Order-set since it is more conservative.  Thank you  Raoul Pitch BSN, RN,CDE Inpatient Diabetes Coordinator [phone] (team pager)

## 2013-09-03 NOTE — Progress Notes (Addendum)
Physical Therapy Treatment and D/C Patient Details Name: [name] [name] MRN: [id number] DOB: [birth year] Today's Date: 09/03/2013    History of Present Illness Pt is 68 yo male with DM, ESRD on HD since 4/15, presents with CP and underwent CABG followed by pneumothorax, now with left chest tube.    PT Comments    Pt admitted with CABG with persistent PTX. Pt currently without significant  functional limitations and can ambulate with RW with Modif I.  Pt is following sternal precautions as well.  Given that pt has dialysis and still has chest tube in place feel that he still may need short term NHP for medical management and can be seen by PT short term there and placed on a restorative program until his medical issues are resolved as well as giving pt a little more time for sternum to heal.  Pt no longer needs skilled PT in hospital setting as his goals here have been met.  Pt will benefit from skilled PT in NH to assess his independence and safety with mobility in that setting and transition to restorative program when appropriate.   Will d/c PT as goals met in hospital setting.   Follow Up Recommendations  SNF;Supervision/Assistance - 24 hour     Equipment Recommendations  Rolling walker with 5" wheels    Recommendations for Other Services       Precautions / Restrictions Precautions Precautions: Sternal Precaution Comments: reviewed sternal precautions Restrictions Weight Bearing Restrictions: No Other Position/Activity Restrictions: pt with chest tube on suction    Mobility  Bed Mobility               General bed mobility comments: pt in chair  Transfers Overall transfer level: Independent Equipment used: None Transfers: Sit to/from Stand Sit to Stand: Independent         General transfer comment: good adherence to sternal precautions  Ambulation/Gait Ambulation/Gait assistance: Modified independent (Device/Increase time) Ambulation Distance (Feet): 650  Feet Assistive device: Rolling walker (2 wheeled) Gait Pattern/deviations: WFL(Within Functional Limits)   Gait velocity interpretation: at or above normal speed for age/gender General Gait Details: Pt safe with RW.  Tolerated ambulation well. No LOB with challenges.     Stairs  Declined as he states he has no stairs to perform at d/c.          Wheelchair Mobility    Modified Rankin (Stroke Patients Only)       Balance           Standing balance support: No upper extremity supported;During functional activity Standing balance-Leahy Scale: Fair Standing balance comment: Pt can stand without RW.  Uses RW for chest tube.                    Cognition Arousal/Alertness: Awake/alert Behavior During Therapy: WFL for tasks assessed/performed Overall Cognitive Status: Within Functional Limits for tasks assessed                      Exercises General Exercises - Lower Extremity Ankle Circles/Pumps: AROM;10 reps;Seated Long Arc Quad: AROM;10 reps;Seated Hip Flexion/Marching: AROM;Both;10 reps;Seated    General Comments        Pertinent Vitals/Pain Pain Assessment: No/denies pain VSS    Home Living                      Prior Function            PT Goals (current goals can now be found  in the care plan section) Progress towards PT goals: Goals met/education completed, patient discharged from PT    Frequency  Min 3X/week    PT Plan Current plan remains appropriate    Co-evaluation             End of Session Equipment Utilized During Treatment: Gait belt Activity Tolerance: Patient tolerated treatment well Patient left: in chair;with call bell/phone within reach     Time: 0951-1009 PT Time Calculation (min): 18 min  Charges:  $Gait Training: 8-22 mins                    G Codes:      [name] [name] [place] 2013-09-20, 11:25 AM [facility] [phone] (pager)

## 2013-09-03 NOTE — Progress Notes (Signed)
Pt has f/u appt w/ Cecilie Kicks on September 9, at 9:30 am.

## 2013-09-03 NOTE — Progress Notes (Addendum)
      [address]       [place] [address]             [phone]      15 Days Post-Op Procedure(s) (LRB): CORONARY ARTERY BYPASS GRAFTING (CABG) times 5 using left internal mammary artery to LAD, saphenous vein graft to diagonal; sequential saphenous vein graft to intermediate and distal circumflex; and saphenous vein graft to right coronary artery  (N/A) INTRAOPERATIVE TRANSESOPHAGEAL ECHOCARDIOGRAM (N/A) ENDOVEIN HARVEST OF GREATER SAPHENOUS VEIN from left thigh and calf (Left)  Subjective:  [name] [name] complains of irritation in his eyes and nose.  He otherwise has no complaints this morning.  Objective: Vital signs in last 24 hours: Temp:  [98 F (36.7 C)-98.6 F (37 C)] 98.6 F (37 C) (08/25 0556) Pulse Rate:  [80-87] 87 (08/25 0556) Cardiac Rhythm:  [-] Heart block;Normal sinus rhythm (08/24 2015) Resp:  [18] 18 (08/25 0556) BP: (90-113)/(53-80) 113/64 mmHg (08/25 0556) SpO2:  [98 %-100 %] 100 % (08/25 0556) Weight:  [156 lb 8.4 oz (71 kg)] 156 lb 8.4 oz (71 kg) (08/25 0556)  Intake/Output from previous day: 08/24 0701 - 08/25 0700 In: 603 [P.O.:600; I.V.:3] Out: -   General appearance: alert, cooperative and no distress Heart: regular rate and rhythm Lungs: clear to auscultation bilaterally Abdomen: soft, non-tender; bowel sounds normal; no masses,  no organomegaly Extremities: edema trace Wound: clean and dry  Lab Results:  Recent Labs  09/02/13 0416 09/03/13 0410  WBC 12.2* 10.5  HGB 8.2* 7.5*  HCT 24.5* 22.2*  PLT 515* 481*   BMET:  Recent Labs  09/02/13 0416 09/03/13 0410  NA 137 132*  K 4.0 4.0  CL 93* 90*  CO2 29 25  GLUCOSE 103* 65*  BUN 26* 35*  CREATININE 8.95* 11.08*  CALCIUM 9.8 9.5    PT/INR: No results found for this basename: LABPROT, INR,  in the last 72 hours ABG    Component Value Date/Time   PHART 7.358 08/19/2013 1958   HCO3 23.5 08/19/2013 1958   TCO2 24 08/20/2013 1640   ACIDBASEDEF 2.0 08/19/2013 1958    O2SAT 99.0 08/19/2013 1958   CBG (last 3)   Recent Labs  09/02/13 1627 09/02/13 2115 09/03/13 0615  GLUCAP 97 137* 85    Assessment/Plan: S/P Procedure(s) (LRB): CORONARY ARTERY BYPASS GRAFTING (CABG) times 5 using left internal mammary artery to LAD, saphenous vein graft to diagonal; sequential saphenous vein graft to intermediate and distal circumflex; and saphenous vein graft to right coronary artery  (N/A) INTRAOPERATIVE TRANSESOPHAGEAL ECHOCARDIOGRAM (N/A) ENDOVEIN HARVEST OF GREATER SAPHENOUS VEIN from left thigh and calf (Left)  1. Chest tube- placed back on suction, 1+ air leak this morning, CXR with some improvement of left apical pneumothorax 2. CV- remains hemodynamically stable 3. Renal- ESRD, HD today?, Nephrology following 4. Dispo- leave chest tube on suction, repeat CXR in AM   LOS: 21 days    [name], [name] 09/03/2013  Patient stable, small air leak with cough Waiting for dialysis, will try again off suction and transition to mini express and home with tube I have seen and examined [name] [name] and agree with the above assessment  and plan.  [name] MD Beeper [phone] Office [phone] 09/03/2013 11:22 AM

## 2013-09-03 NOTE — Procedures (Signed)
Patient was seen on dialysis and the procedure was supervised. BFR 400 Via LUE AVF BP is 112/62.  Patient appears to be tolerating treatment well.

## 2013-09-04 ENCOUNTER — Inpatient Hospital Stay (HOSPITAL_COMMUNITY): Payer: Medicare Other

## 2013-09-04 LAB — GLUCOSE, CAPILLARY
GLUCOSE-CAPILLARY: 100 mg/dL — AB (ref 70–99)
Glucose-Capillary: 88 mg/dL (ref 70–99)
Glucose-Capillary: 110 mg/dL — ABNORMAL HIGH (ref 70–99)
Glucose-Capillary: 125 mg/dL — ABNORMAL HIGH (ref 70–99)

## 2013-09-04 MED ORDER — NAPHAZOLINE-PHENIRAMINE 0.025-0.3 % OP SOLN
1.0000 [drp] | Freq: Four times a day (QID) | OPHTHALMIC | Status: DC | PRN
  Administered 2013-09-04: 1 [drp] via OPHTHALMIC
  Filled 2013-09-04: qty 15

## 2013-09-04 NOTE — Clinical Social Work Note (Signed)
Clinical Social Worker continuing to follow patient and family for support and discharge planning needs.  Patient is planning to go to [facility] at discharge.  CSW spoke with [facility] admissions coordinator who spoke with DON and states that they are able to manage a mini express chest tube (draining 3x week).  CSW updated RN and CM.  CSW spoke with patient at bedside who remains agreeable with current discharge plan.  CSW remains available for support and to facilitate patient discharge needs once medically stable.  Barbette Or, [place]

## 2013-09-04 NOTE — Progress Notes (Signed)
   [facility] Progress Note   Subjective: no compliants, up in chair  Filed Vitals:   09/03/13 2116 09/03/13 2322 09/04/13 0421 09/04/13 0500  BP: 92/57 103/58 96/52   Pulse: 94  81   Temp: 98.5 F (36.9 C)  98.2 F (36.8 C)   TempSrc: Oral  Oral   Resp: 18  18   Height:      Weight:    70 kg (154 lb 5.2 oz)  SpO2: 95%  98%    Exam: Alert, no distress Clear bilat , L chest tube in place RRR no MRG Abd soft, NTND No LE edema LUA AVF +bruit NEuro is nf, Ox 3  [facility]  400/800 2/2.0 Bath 73kg Heparin 4000 L arm AVF  Calcitriol 0.5 ug TIW, Venofer 50 mg q Thursday  pth 1000, P 5.6 tsat 16% Hb 12       Assessment: 1 S/P CABG 8/11 2 Pneumothorax / air leak / chest tube - per primary 3 ESRD on HD 4 HTN/vol-  on MTP, below dry wt 5 Anemia on aranesp and loading w iron, Hb 7.5 yest 6 HPTH cont po vit D, binders 7 DM on insulin 8 Aflutter on amio, BB 9 Dispo to SNF when ready  Plan- cont [facility]TS HD    Kelly Splinter MD  pager [phone]    cell [phone]  09/04/2013, 8:01 AM     Recent Labs Lab 08/29/13 0813 08/31/13 0741 09/02/13 0416 09/03/13 0410  NA 140 133* 137 132*  K 4.0 4.5 4.0 4.0  CL 99 91* 93* 90*  CO2 26 26 29 25   GLUCOSE 133* 107* 103* 65*  BUN 30* 27* 26* 35*  CREATININE 7.69* 8.48* 8.95* 11.08*  CALCIUM 9.3 9.8 9.8 9.5  PHOS 4.0 3.6  --  4.0    Recent Labs Lab 08/29/13 0813 08/31/13 0741 09/03/13 0410  ALBUMIN 2.5* 2.8* 2.5*    Recent Labs Lab 08/31/13 0415 09/02/13 0416 09/03/13 0410  WBC 15.4* 12.2* 10.5  HGB 7.8* 8.2* 7.5*  HCT 22.7* 24.5* 22.2*  MCV 92.3 92.1 90.2  PLT 488* 515* 481*   . amiodarone  200 mg Oral Daily  . antiseptic oral rinse  7 mL Mouth Rinse BID  . aspirin EC  325 mg Oral Daily  . atorvastatin  20 mg Oral q1800  . darbepoetin (ARANESP) injection - DIALYSIS  25 mcg Intravenous Q Thu-HD  . docusate sodium  200 mg Oral Daily  . enoxaparin (LOVENOX) injection  30 mg Subcutaneous Q24H   . ferric gluconate (FERRLECIT/NULECIT) IV  125 mg Intravenous Q T,Th,Sa-HD  . insulin aspart  0-24 Units Subcutaneous TID AC & HS  . metoprolol tartrate  12.5 mg Oral BID  . pantoprazole  40 mg Oral QAC breakfast  . polyethylene glycol  17 g Oral Daily  . sevelamer carbonate  1,600 mg Oral TID WC  . sodium chloride  3 mL Intravenous Q12H  . tamsulosin  0.4 mg Oral QHS     sodium chloride, bisacodyl, bisacodyl, dextrose, guaiFENesin, levalbuterol, naphazoline-pheniramine, oxyCODONE, sodium chloride, sodium chloride, traMADol

## 2013-09-04 NOTE — Progress Notes (Addendum)
[address]       [place] [address]             [phone]      16 Days Post-Op Procedure(s) (LRB): CORONARY ARTERY BYPASS GRAFTING (CABG) times 5 using left internal mammary artery to LAD, saphenous vein graft to diagonal; sequential saphenous vein graft to intermediate and distal circumflex; and saphenous vein graft to right coronary artery  (N/A) INTRAOPERATIVE TRANSESOPHAGEAL ECHOCARDIOGRAM (N/A) ENDOVEIN HARVEST OF GREATER SAPHENOUS VEIN from left thigh and calf (Left) Subjective: Feels ok, getting stronger, Cont to have large air leak  Objective: Vital signs in last 24 hours: Temp:  [97.7 F (36.5 C)-98.5 F (36.9 C)] 98.2 F (36.8 C) (08/26 0421) Pulse Rate:  [81-94] 81 (08/26 0421) Cardiac Rhythm:  [-] Normal sinus rhythm (08/25 2030) Resp:  [12-18] 18 (08/26 0421) BP: (92-129)/(52-81) 96/52 mmHg (08/26 0421) SpO2:  [95 %-99 %] 98 % (08/26 0421) Weight:  [153 lb 7 oz (69.6 kg)-160 lb 0.9 oz (72.6 kg)] 154 lb 5.2 oz (70 kg) (08/26 0500)  Hemodynamic parameters for last 24 hours:    Intake/Output from previous day: 08/25 0701 - 08/26 0700 In: 720 [P.O.:720] Out: 2750 [Urine:50; Chest Tube:70] Intake/Output this shift:    General appearance: alert, cooperative and no distress Heart: regular rate and rhythm Lungs: clear to auscultation bilaterally Abdomen: benign Extremities: no edema Wound: incis healing well  Lab Results:  Recent Labs  09/02/13 0416 09/03/13 0410  WBC 12.2* 10.5  HGB 8.2* 7.5*  HCT 24.5* 22.2*  PLT 515* 481*   BMET:  Recent Labs  09/02/13 0416 09/03/13 0410  NA 137 132*  K 4.0 4.0  CL 93* 90*  CO2 29 25  GLUCOSE 103* 65*  BUN 26* 35*  CREATININE 8.95* 11.08*  CALCIUM 9.8 9.5    PT/INR: No results found for this basename: LABPROT, INR,  in the last 72 hours ABG    Component Value Date/Time   PHART 7.358 08/19/2013 1958   HCO3 23.5 08/19/2013 1958   TCO2 24 08/20/2013 1640   ACIDBASEDEF 2.0  08/19/2013 1958   O2SAT 99.0 08/19/2013 1958   CBG (last 3)   Recent Labs  09/03/13 1757 09/03/13 2101 09/04/13 0604  GLUCAP 96 162* 88    Meds Scheduled Meds: . amiodarone  200 mg Oral Daily  . antiseptic oral rinse  7 mL Mouth Rinse BID  . aspirin EC  325 mg Oral Daily  . atorvastatin  20 mg Oral q1800  . darbepoetin (ARANESP) injection - DIALYSIS  25 mcg Intravenous Q Thu-HD  . docusate sodium  200 mg Oral Daily  . enoxaparin (LOVENOX) injection  30 mg Subcutaneous Q24H  . ferric gluconate (FERRLECIT/NULECIT) IV  125 mg Intravenous Q T,Th,Sa-HD  . insulin aspart  0-24 Units Subcutaneous TID AC & HS  . metoprolol tartrate  12.5 mg Oral BID  . pantoprazole  40 mg Oral QAC breakfast  . polyethylene glycol  17 g Oral Daily  . sevelamer carbonate  1,600 mg Oral TID WC  . sodium chloride  3 mL Intravenous Q12H  . tamsulosin  0.4 mg Oral QHS   Continuous Infusions:  PRN Meds:.sodium chloride, bisacodyl, bisacodyl, dextrose, guaiFENesin, levalbuterol, naphazoline-pheniramine, oxyCODONE, sodium chloride, sodium chloride, traMADol  Xrays Dg Chest Port 1 View  09/03/2013   CLINICAL DATA:  Evaluate pneumothorax and chest tube  EXAM: PORTABLE CHEST - 1 VIEW  COMPARISON:  Prior chest x-ray 09/02/2013  FINDINGS: Improving left  apical pneumothorax with chest tube in place. There is approximately 1.5 cm of pleural displacement at the apex compared to 2.4 cm yesterday. The left-sided chest tube remains in good position. Stable cardiomegaly and mediastinal contours. Patient is status post median sternotomy with evidence of prior multivessel CABG. No pleural effusion. The aerated portion of the lungs remains clear. There is decreased atelectasis in the left lower lobe.  IMPRESSION: 1. Persistent but improving left apical pneumothorax with chest tube in place. 2. Decreased left lower lobe atelectasis. 3. Stable cardiomegaly.   Electronically Signed   By: Jacqulynn Cadet M.D.   On: 09/03/2013 08:07      Assessment/Plan: S/P Procedure(s) (LRB): CORONARY ARTERY BYPASS GRAFTING (CABG) times 5 using left internal mammary artery to LAD, saphenous vein graft to diagonal; sequential saphenous vein graft to intermediate and distal circumflex; and saphenous vein graft to right coronary artery  (N/A) INTRAOPERATIVE TRANSESOPHAGEAL ECHOCARDIOGRAM (N/A) ENDOVEIN HARVEST OF GREATER SAPHENOUS VEIN from left thigh and calf (Left)  1 small pneumo and + air leak persist- not much different than when on suction- poss change to mini-express soon 2 nephrology managing dialysis    LOS: 22 days    GOLD,WAYNE E 09/04/2013  Intermittent air leak much small today mini express today follow up pa lat chest xray in am I have seen and examined [name] and agree with the above assessment  and plan.  [name] MD Beeper [phone] Office [phone] 09/04/2013 12:45 PM

## 2013-09-04 NOTE — Progress Notes (Signed)
Changed [facility] drainage collection to Mini Express.  Placed on Water seal per orders.  Lung sounds decreased bases, air leak noted.  Patient tolerated well, educated to call if he has any respiratory distress or increased shortness of breath.

## 2013-09-04 NOTE — Progress Notes (Signed)
CARDIAC REHAB PHASE I   PRE:  Rate/Rhythm: 85 SR  BP:  Supine:   Sitting: 90/50  Standing:    SaO2: 99 RA  MODE:  Ambulation: 850 ft   POST:  Rate/Rhythm: 85  BP:  Supine:   Sitting: 98/50  Standing:    SaO2: 98 RA 1123-1145 Assisted X 1 and used walker to ambulate. Gait steady with walker. Pt able to walk 850 feet without c/o. Pt back to recliner after walk with call light in reach.  Rodney Langton RN 09/04/2013 11:49 AM

## 2013-09-05 ENCOUNTER — Inpatient Hospital Stay (HOSPITAL_COMMUNITY): Payer: Medicare Other

## 2013-09-05 LAB — GLUCOSE, CAPILLARY
Glucose-Capillary: 121 mg/dL — ABNORMAL HIGH (ref 70–99)
Glucose-Capillary: 119 mg/dL — ABNORMAL HIGH (ref 70–99)
Glucose-Capillary: 101 mg/dL — ABNORMAL HIGH (ref 70–99)
Glucose-Capillary: 129 mg/dL — ABNORMAL HIGH (ref 70–99)

## 2013-09-05 LAB — RENAL FUNCTION PANEL
Albumin: 2.7 g/dL — ABNORMAL LOW (ref 3.5–5.2)
Anion gap: 16 — ABNORMAL HIGH (ref 5–15)
BUN: 30 mg/dL — AB (ref 6–23)
CHLORIDE: 93 meq/L — AB (ref 96–112)
CO2: 27 meq/L (ref 19–32)
CREATININE: 10.37 mg/dL — AB (ref 0.50–1.35)
Calcium: 9.4 mg/dL (ref 8.4–10.5)
GFR calc Af Amer: 5 mL/min — ABNORMAL LOW (ref >90)
GFR, EST NON AFRICAN AMERICAN: 5 mL/min — AB (ref >90)
Glucose, Bld: 92 mg/dL (ref 70–99)
Phosphorus: 3.9 mg/dL (ref 2.3–4.6)
Potassium: 4.9 mEq/L (ref 3.7–5.3)
Sodium: 136 mEq/L — ABNORMAL LOW (ref 137–147)

## 2013-09-05 LAB — CBC
HEMATOCRIT: 22.7 % — AB (ref 39.0–52.0)
HEMOGLOBIN: 7.6 g/dL — AB (ref 13.0–17.0)
MCH: 31.4 pg (ref 26.0–34.0)
MCHC: 33.5 g/dL (ref 30.0–36.0)
MCV: 93.8 fL (ref 78.0–100.0)
PLATELETS: 438 10*3/uL — AB (ref 150–400)
RBC: 2.42 MIL/uL — ABNORMAL LOW (ref 4.22–5.81)
RDW: 14.3 % (ref 11.5–15.5)
WBC: 10 10*3/uL (ref 4.0–10.5)

## 2013-09-05 MED ORDER — NEPRO/CARBSTEADY PO LIQD
237.0000 mL | ORAL | Status: DC | PRN
  Filled 2013-09-05: qty 237

## 2013-09-05 MED ORDER — SODIUM CHLORIDE 0.9 % IV SOLN: 100.0000 mL | INTRAVENOUS | Status: DC | PRN

## 2013-09-05 MED ORDER — LIDOCAINE-PRILOCAINE 2.5-2.5 % EX CREA: 1.0000 "application " | TOPICAL_CREAM | CUTANEOUS | Status: DC | PRN

## 2013-09-05 MED ORDER — HEPARIN SODIUM (PORCINE) 1000 UNIT/ML DIALYSIS: 1000.0000 [IU] | INTRAMUSCULAR | Status: DC | PRN

## 2013-09-05 MED ORDER — DARBEPOETIN ALFA-POLYSORBATE 25 MCG/0.42ML IJ SOLN
INTRAMUSCULAR | Status: AC
  Administered 2013-09-05: 25 ug via INTRAVENOUS
  Filled 2013-09-05: qty 0.42

## 2013-09-05 MED ORDER — LIDOCAINE HCL (PF) 1 % IJ SOLN: 5.0000 mL | INTRAMUSCULAR | Status: DC | PRN

## 2013-09-05 MED ORDER — LIDOCAINE-PRILOCAINE 2.5-2.5 % EX CREA
1.0000 "application " | TOPICAL_CREAM | CUTANEOUS | Status: DC | PRN
  Filled 2013-09-05: qty 5

## 2013-09-05 MED ORDER — HEPARIN SODIUM (PORCINE) 1000 UNIT/ML DIALYSIS: 2000.0000 [IU] | Freq: Once | INTRAMUSCULAR | Status: DC

## 2013-09-05 MED ORDER — NEPRO/CARBSTEADY PO LIQD: 237.0000 mL | ORAL | Status: DC | PRN

## 2013-09-05 MED ORDER — ALTEPLASE 2 MG IJ SOLR: 2.0000 mg | Freq: Once | INTRAMUSCULAR | Status: DC | PRN

## 2013-09-05 MED ORDER — PENTAFLUOROPROP-TETRAFLUOROETH EX AERO
1.0000 | INHALATION_SPRAY | CUTANEOUS | Status: DC | PRN
Start: 2013-09-05 — End: 2013-09-05

## 2013-09-05 MED ORDER — PENTAFLUOROPROP-TETRAFLUOROETH EX AERO: 1.0000 "application " | INHALATION_SPRAY | CUTANEOUS | Status: DC | PRN

## 2013-09-05 MED ORDER — HEPARIN SODIUM (PORCINE) 1000 UNIT/ML DIALYSIS
20.0000 [IU]/kg | INTRAMUSCULAR | Status: DC | PRN
  Filled 2013-09-05: qty 2

## 2013-09-05 MED ORDER — HEPARIN SODIUM (PORCINE) 1000 UNIT/ML DIALYSIS
1000.0000 [IU] | INTRAMUSCULAR | Status: DC | PRN
  Filled 2013-09-05: qty 1

## 2013-09-05 NOTE — Progress Notes (Signed)
CARDIAC REHAB PHASE I   PRE:  Rate/Rhythm: 88 SR  BP:  Supine:   Sitting: 96/54  Standing:    SaO2: 98 RA  MODE:  Ambulation: 890 ft   POST:  Rate/Rhythm: 87  BP:  Supine:   Sitting: 104/49  Standing:    SaO2: 100 RA 1055-1130 Assisted X  1 and used walker to ambulate. Gait steady with walker. Pt able to walk 890 feet without c/o. Pt back to recliner after walk with call light in reach. I discussed Oupt CRP with him, he agrees to referral to [facility]. I discussed smoking cessation with pt. He wants to quit. I gave him tips for quitting, coaching contact number and quit smart class information.  Rodney Langton RN 09/05/2013 11:28 AM

## 2013-09-05 NOTE — Progress Notes (Signed)
Subjective:  No chest pain or dyspnea, feeling well  Objective: Vital signs in last 24 hours: Temp:  [98 F (36.7 C)-99.2 F (37.3 C)] 99.2 F (37.3 C) (08/27 0500) Pulse Rate:  [88] 88 (08/26 1345) Resp:  [18] 18 (08/27 0500) BP: (88-116)/(46-62) 116/62 mmHg (08/27 0500) SpO2:  [99 %-100 %] 100 % (08/27 0500) Weight:  [69.7 kg (153 lb 10.6 oz)] 69.7 kg (153 lb 10.6 oz) (08/27 0500) Weight change: -2.9 kg (-6 lb 6.3 oz)  Intake/Output from previous day: 08/26 0701 - 08/27 0700 In: 360 [P.O.:360] Out: 51 [Urine:50; Stool:1] Intake/Output this shift: Total I/O In: 120 [P.O.:120] Out: -   Lab Results:  Recent Labs  09/03/13 0410  WBC 10.5  HGB 7.5*  HCT 22.2*  PLT 481*   BMET:  Recent Labs  09/03/13 0410  NA 132*  K 4.0  CL 90*  CO2 25  GLUCOSE 65*  BUN 35*  CREATININE 11.08*  CALCIUM 9.5  ALBUMIN 2.5*   No results found for this basename: PTH,  in the last 72 hours Iron Studies: No results found for this basename: IRON, TIBC, TRANSFERRIN, FERRITIN,  in the last 72 hours  Studies/Results: Dg Chest Port 1 View  09/04/2013   CLINICAL DATA:  Chest tube.  EXAM: PORTABLE CHEST - 1 VIEW  COMPARISON:  09/03/2013.  FINDINGS: Left chest tube in stable position. Tiny left apical pneumothorax unchanged. a mediastinum hilar structures normal. Cardiomegaly. Prior CABG. Pulmonary vascularity is normal. No CHF. No focal pulmonary infiltrate. No pleural effusion. No acute bony abnormality.  IMPRESSION: 1. Left chest tube in stable position. 2. Stable tiny left apical pneumothorax. 3. Prior CABG.  Cardiomegaly.  No CHF.   Electronically Signed   By: Marcello Moores  Register   On: 09/04/2013 08:10   EXAM:  General appearance: Alert, in no apparent distress  Resp: CTA without rales, rhonchi, or wheezes, with L chest tube  Cardio: RRR without murmur or rub  GI: + BS, soft and nontender  Extremities: No edema  Access: AVF @ LUA with + bruit   Dialysis: [facility]  4h F180 400/800 2/2.0  [place] 73kg Heparin 4000 L arm AVF  Calcitriol 0.5 ug TIW, Venofer 50 mg q Thursday  pth 1000, P 5.6 tsat 16% Hb 12  Assessment/Plan: 1. NSTEMI - s/p CABG x 5 by Dr. Servando Snare 8/11. 2. Pneumothorax - resolved per x-ray 8/27, with left chest tube. 3. ESRD - HD on TTS @ [facility], K 4.  HD pending today. 4. HTN/Volume - BP 115/62 on Metoprolol 12.5 mg bid; below EDW. 5. Anemia - Hgb down to 7.5, Aranesp 25 mcg on Thurs, Fe loading. 6. Sec HPT - Ca 9.5 (10.7 corrected); Calcitriol 0.5 mcg, Renvela 2 with meals.  Hold Calcitriol. 7. Nutrition - Alb 2.5, renal carb-mod diet, multivitamin. 8. DM - insulin per primary. 9. A-flutter - NSR, Amiodarone & BB.     LOS: 23 days   LYLES,CHARLES 09/05/2013,12:41 PM  Pt seen, examined and agree w A/P as above.  Kelly Splinter MD pager [phone]    cell [phone] 09/05/2013, 2:27 PM

## 2013-09-05 NOTE — Progress Notes (Signed)
[address]       [place] [address]             [phone]      17 Days Post-Op Procedure(s) (LRB): CORONARY ARTERY BYPASS GRAFTING (CABG) times 5 using left internal mammary artery to LAD, saphenous vein graft to diagonal; sequential saphenous vein graft to intermediate and distal circumflex; and saphenous vein graft to right coronary artery  (N/A) INTRAOPERATIVE TRANSESOPHAGEAL ECHOCARDIOGRAM (N/A) ENDOVEIN HARVEST OF GREATER SAPHENOUS VEIN from left thigh and calf (Left) Subjective: Feels better  Objective: Vital signs in last 24 hours: Temp:  [98 F (36.7 C)-99.2 F (37.3 C)] 99.2 F (37.3 C) (08/27 0500) Pulse Rate:  [88] 88 (08/26 1345) Cardiac Rhythm:  [-] Heart block (08/26 2302) Resp:  [18] 18 (08/27 0500) BP: (88-116)/(46-62) 116/62 mmHg (08/27 0500) SpO2:  [99 %-100 %] 100 % (08/27 0500) Weight:  [153 lb 10.6 oz (69.7 kg)] 153 lb 10.6 oz (69.7 kg) (08/27 0500)  Hemodynamic parameters for last 24 hours:    Intake/Output from previous day: 08/26 0701 - 08/27 0700 In: 360 [P.O.:360] Out: 51 [Urine:50; Stool:1] Intake/Output this shift:    General appearance: alert, cooperative and no distress Heart: regular rate and rhythm Lungs: clear to auscultation bilaterally Abdomen: benign Extremities: no edema Wound: incis healing well  Lab Results:  Recent Labs  09/03/13 0410  WBC 10.5  HGB 7.5*  HCT 22.2*  PLT 481*   BMET:  Recent Labs  09/03/13 0410  NA 132*  K 4.0  CL 90*  CO2 25  GLUCOSE 65*  BUN 35*  CREATININE 11.08*  CALCIUM 9.5    PT/INR: No results found for this basename: LABPROT, INR,  in the last 72 hours ABG    Component Value Date/Time   PHART 7.358 08/19/2013 1958   HCO3 23.5 08/19/2013 1958   TCO2 24 08/20/2013 1640   ACIDBASEDEF 2.0 08/19/2013 1958   O2SAT 99.0 08/19/2013 1958   CBG (last 3)   Recent Labs  09/04/13 1707 09/04/13 2123 09/05/13 0625  GLUCAP 100* 125* 121*    Meds Scheduled  Meds: . amiodarone  200 mg Oral Daily  . antiseptic oral rinse  7 mL Mouth Rinse BID  . aspirin EC  325 mg Oral Daily  . atorvastatin  20 mg Oral q1800  . darbepoetin (ARANESP) injection - DIALYSIS  25 mcg Intravenous Q Thu-HD  . docusate sodium  200 mg Oral Daily  . enoxaparin (LOVENOX) injection  30 mg Subcutaneous Q24H  . ferric gluconate (FERRLECIT/NULECIT) IV  125 mg Intravenous Q T,Th,Sa-HD  . insulin aspart  0-24 Units Subcutaneous TID AC & HS  . metoprolol tartrate  12.5 mg Oral BID  . pantoprazole  40 mg Oral QAC breakfast  . polyethylene glycol  17 g Oral Daily  . sevelamer carbonate  1,600 mg Oral TID WC  . sodium chloride  3 mL Intravenous Q12H  . tamsulosin  0.4 mg Oral QHS   Continuous Infusions:  PRN Meds:.sodium chloride, bisacodyl, bisacodyl, dextrose, guaiFENesin, levalbuterol, naphazoline-pheniramine, oxyCODONE, sodium chloride, sodium chloride, traMADol  Xrays Dg Chest 2 View  09/05/2013   CLINICAL DATA:  Postoperative from chest surgery  EXAM: CHEST  2 VIEW  COMPARISON:  Portable chest x-ray of September 04, 2013  FINDINGS: The previously demonstrated left apical pneumothorax is not visible currently. The chest tube tip projects over the posterior lateral aspect of the left fourth rib. Elsewhere the left lung exhibits no acute abnormality.  The right lung is clear. The heart and pulmonary vascularity are within the limits of normal the patient has undergone previous CABG. The trachea is midline. The bony thorax is unremarkable.  IMPRESSION: There is no residual pneumothorax nor other acute cardiopulmonary abnormality. The chest tube is in appropriate position.   Electronically Signed   By: David  Martinique   On: 09/05/2013 08:06   Dg Chest Port 1 View  09/04/2013   CLINICAL DATA:  Chest tube.  EXAM: PORTABLE CHEST - 1 VIEW  COMPARISON:  09/03/2013.  FINDINGS: Left chest tube in stable position. Tiny left apical pneumothorax unchanged. a mediastinum hilar structures normal.  Cardiomegaly. Prior CABG. Pulmonary vascularity is normal. No CHF. No focal pulmonary infiltrate. No pleural effusion. No acute bony abnormality.  IMPRESSION: 1. Left chest tube in stable position. 2. Stable tiny left apical pneumothorax. 3. Prior CABG.  Cardiomegaly.  No CHF.   Electronically Signed   By: Marcello Moores  Register   On: 09/04/2013 08:10    Assessment/Plan: S/P Procedure(s) (LRB): CORONARY ARTERY BYPASS GRAFTING (CABG) times 5 using left internal mammary artery to LAD, saphenous vein graft to diagonal; sequential saphenous vein graft to intermediate and distal circumflex; and saphenous vein graft to right coronary artery  (N/A) INTRAOPERATIVE TRANSESOPHAGEAL ECHOCARDIOGRAM (N/A) ENDOVEIN HARVEST OF GREATER SAPHENOUS VEIN from left thigh and calf (Left)  1 doing well, I dont see air leak today and CXR is improved. He will get dialysis today and plan home in am, with/without tube will be determined at that time.     LOS: 23 days    Karalyne Nusser E 09/05/2013

## 2013-09-05 NOTE — Procedures (Signed)
I was present at this dialysis session, have reviewed the session itself and made  appropriate changes  Kelly Splinter MD (pgr) [phone]    [phone] 09/05/2013, 5:46 PM

## 2013-09-05 NOTE — Progress Notes (Signed)
    Subjective:  No chest pain, shortness of breath. Feeling ok.  Objective:  Vital Signs in the last 24 hours: Temp:  [98 F (36.7 C)-99.2 F (37.3 C)] 99.2 F (37.3 C) (08/27 0500) Pulse Rate:  [88] 88 (08/26 1345) Resp:  [18] 18 (08/27 0500) BP: (88-116)/(46-62) 116/62 mmHg (08/27 0500) SpO2:  [99 %-100 %] 100 % (08/27 0500) Weight:  [153 lb 10.6 oz (69.7 kg)] 153 lb 10.6 oz (69.7 kg) (08/27 0500)  Intake/Output from previous day: 08/26 0701 - 08/27 0700 In: 360 [P.O.:360] Out: 51 [Urine:50; Stool:1]  Physical Exam: Pt is alert and oriented, NAD HEENT: normal Neck: JVP - normal Lungs: CTA bilaterally CV: RRR without murmur or gallop Abd: soft, NT, Positive BS Ext: no C/C/E Skin: warm/dry no rash   Lab Results:  Recent Labs  09/03/13 0410  WBC 10.5  HGB 7.5*  PLT 481*    Recent Labs  09/03/13 0410  NA 132*  K 4.0  CL 90*  CO2 25  GLUCOSE 65*  BUN 35*  CREATININE 11.08*   No results found for this basename: TROPONINI, CK, MB,  in the last 72 hours  Cardiac Studies: CXR: IMPRESSION:  There is no residual pneumothorax nor other acute cardiopulmonary  abnormality. The chest tube is in appropriate position.  Tele: Sinus rhythm, personally reviewed.  Assessment/Plan:  1. Atrial flutter, maintaining sinus rhythm on amiodarone  2. Non-STEMI, status post multivessel CABG postoperative day 17 3. Mixed systolic and diastolic heart failure. Volume management per hemodialysis. Tolerating low-dose beta blocker.  4. End-stage renal disease  5. Anemia  6. Left-sided pneumothorax with persistent air leak  CV remains stable. He is in sinus rhythm on oral amio. No evidence of volume overload. On ASA, lipitor, metoprolol, and atorvastatin. Follow-up arranged Cecilie Kicks 9/9 at 9:30am.   Sherren Mocha, M.D. 09/05/2013, 8:31 AM

## 2013-09-05 NOTE — Clinical Social Work Note (Signed)
Clinical Social Worker continuing to follow patient for support and discharge planning needs.  Patient has had mini express chest tube placed and is scheduled for dialysis today.  Per CM, patient will be medically ready for discharge tomorrow (08/28).  CSW spoke with [facility] who states that patient is able to admit tomorrow and mini express chest tube can be managed.  CSW remains available for support and to facilitate patient discharge needs once medically stable.  Barbette Or, [place]

## 2013-09-06 ENCOUNTER — Inpatient Hospital Stay (HOSPITAL_COMMUNITY): Payer: Medicare Other

## 2013-09-06 LAB — GLUCOSE, CAPILLARY
Glucose-Capillary: 102 mg/dL — ABNORMAL HIGH (ref 70–99)
Glucose-Capillary: 93 mg/dL (ref 70–99)

## 2013-09-06 MED ORDER — SODIUM CHLORIDE 0.9 % IV SOLN: 125.0000 mg | INTRAVENOUS | Status: AC

## 2013-09-06 MED ORDER — METOPROLOL TARTRATE 12.5 MG HALF TABLET: 12.5000 mg | ORAL_TABLET | Freq: Two times a day (BID) | ORAL | Status: DC

## 2013-09-06 MED ORDER — AMIODARONE HCL 200 MG PO TABS: 200.0000 mg | ORAL_TABLET | Freq: Every day | ORAL | Status: DC

## 2013-09-06 MED ORDER — PANTOPRAZOLE SODIUM 40 MG PO TBEC: 40.0000 mg | DELAYED_RELEASE_TABLET | Freq: Every day | ORAL | Status: DC

## 2013-09-06 MED ORDER — POLYETHYLENE GLYCOL 3350 17 G PO PACK: 17.0000 g | PACK | Freq: Every day | ORAL | Status: DC

## 2013-09-06 MED ORDER — NAPHAZOLINE-PHENIRAMINE 0.025-0.3 % OP SOLN: 1.0000 [drp] | Freq: Four times a day (QID) | OPHTHALMIC | Status: AC | PRN

## 2013-09-06 MED ORDER — OXYCODONE HCL 5 MG PO TABS: 5.0000 mg | ORAL_TABLET | ORAL | Status: DC | PRN

## 2013-09-06 MED ORDER — ASPIRIN 325 MG PO TBEC: 325.0000 mg | DELAYED_RELEASE_TABLET | Freq: Every day | ORAL | Status: DC

## 2013-09-06 MED ORDER — DARBEPOETIN ALFA-POLYSORBATE 25 MCG/0.42ML IJ SOLN: 25.0000 ug | INTRAMUSCULAR | Status: AC

## 2013-09-06 NOTE — Progress Notes (Addendum)
[address]       [place] [address]             [phone]      18 Days Post-Op Procedure(s) (LRB): CORONARY ARTERY BYPASS GRAFTING (CABG) times 5 using left internal mammary artery to LAD, saphenous vein graft to diagonal; sequential saphenous vein graft to intermediate and distal circumflex; and saphenous vein graft to right coronary artery  (N/A) INTRAOPERATIVE TRANSESOPHAGEAL ECHOCARDIOGRAM (N/A) ENDOVEIN HARVEST OF GREATER SAPHENOUS VEIN from left thigh and calf (Left) Subjective: Feels well. + air leak, tiny apical pntx on CXR  Objective: Vital signs in last 24 hours: Temp:  [97.7 F (36.5 C)-99.1 F (37.3 C)] 98.1 F (36.7 C) (08/28 0317) Pulse Rate:  [74-96] 95 (08/28 0317) Cardiac Rhythm:  [-] Heart block (08/27 2000) Resp:  [16-23] 20 (08/28 0317) BP: (92-135)/(52-81) 106/53 mmHg (08/28 0317) SpO2:  [94 %-99 %] 99 % (08/28 0317) Weight:  [149 lb 7.6 oz (67.8 kg)-153 lb 14.4 oz (69.809 kg)] 153 lb 14.4 oz (69.809 kg) (08/28 0317)  Hemodynamic parameters for last 24 hours:    Intake/Output from previous day: 08/27 0701 - 08/28 0700 In: 240 [P.O.:240] Out: 1001 [Stool:1] Intake/Output this shift:    General appearance: alert, cooperative and no distress Heart: regular rate and rhythm Lungs: clear to auscultation bilaterally Abdomen: benign Extremities: no edema Wound: incis healing well  Lab Results:  Recent Labs  09/05/13 1400  WBC 10.0  HGB 7.6*  HCT 22.7*  PLT 438*   BMET:  Recent Labs  09/05/13 1400  NA 136*  K 4.9  CL 93*  CO2 27  GLUCOSE 92  BUN 30*  CREATININE 10.37*  CALCIUM 9.4    PT/INR: No results found for this basename: LABPROT, INR,  in the last 72 hours ABG    Component Value Date/Time   PHART 7.358 08/19/2013 1958   HCO3 23.5 08/19/2013 1958   TCO2 24 08/20/2013 1640   ACIDBASEDEF 2.0 08/19/2013 1958   O2SAT 99.0 08/19/2013 1958   CBG (last 3)   Recent Labs  09/05/13 1829 09/05/13 2115  09/06/13 0643  GLUCAP 101* 129* 102*    Meds Scheduled Meds: . amiodarone  200 mg Oral Daily  . antiseptic oral rinse  7 mL Mouth Rinse BID  . aspirin EC  325 mg Oral Daily  . atorvastatin  20 mg Oral q1800  . darbepoetin (ARANESP) injection - DIALYSIS  25 mcg Intravenous Q Thu-HD  . docusate sodium  200 mg Oral Daily  . enoxaparin (LOVENOX) injection  30 mg Subcutaneous Q24H  . ferric gluconate (FERRLECIT/NULECIT) IV  125 mg Intravenous Q T,Th,Sa-HD  . insulin aspart  0-24 Units Subcutaneous TID AC & HS  . metoprolol tartrate  12.5 mg Oral BID  . pantoprazole  40 mg Oral QAC breakfast  . polyethylene glycol  17 g Oral Daily  . sevelamer carbonate  1,600 mg Oral TID WC  . sodium chloride  3 mL Intravenous Q12H  . tamsulosin  0.4 mg Oral QHS   Continuous Infusions:  PRN Meds:.sodium chloride, bisacodyl, bisacodyl, dextrose, guaiFENesin, levalbuterol, naphazoline-pheniramine, oxyCODONE, sodium chloride, sodium chloride, traMADol  Xrays Dg Chest 2 View  09/05/2013   CLINICAL DATA:  Postoperative from chest surgery  EXAM: CHEST  2 VIEW  COMPARISON:  Portable chest x-ray of September 04, 2013  FINDINGS: The previously demonstrated left apical pneumothorax is not visible currently. The chest tube tip projects over the posterior lateral aspect of  the left fourth rib. Elsewhere the left lung exhibits no acute abnormality. The right lung is clear. The heart and pulmonary vascularity are within the limits of normal the patient has undergone previous CABG. The trachea is midline. The bony thorax is unremarkable.  IMPRESSION: There is no residual pneumothorax nor other acute cardiopulmonary abnormality. The chest tube is in appropriate position.   Electronically Signed   By: David  Martinique   On: 09/05/2013 08:06    Assessment/Plan: S/P Procedure(s) (LRB): CORONARY ARTERY BYPASS GRAFTING (CABG) times 5 using left internal mammary artery to LAD, saphenous vein graft to diagonal; sequential saphenous  vein graft to intermediate and distal circumflex; and saphenous vein graft to right coronary artery  (N/A) INTRAOPERATIVE TRANSESOPHAGEAL ECHOCARDIOGRAM (N/A) ENDOVEIN HARVEST OF GREATER SAPHENOUS VEIN from left thigh and calf (Left)  1 feels ok, appears to be stable for discharge to skilled nursing today      LOS: 24 days    GOLD,WAYNE E 09/06/2013  Chest xray ok, very small air leak, home with chest tube in place return to office one week and consider removal I have seen and examined [name] and agree with the above assessment  and plan.  [name] MD Beeper [phone] Office [phone] 09/06/2013 9:35 AM

## 2013-09-06 NOTE — Progress Notes (Signed)
Removed CT sutures and applied benzoin and 1/4 " steri strips.  Pt tolerated procedure well. Pt given signs and symptoms of infection. Pt resting with call bell within reach.  Will continue to monitor. Payton Emerald, RN

## 2013-09-06 NOTE — Clinical Social Work Note (Signed)
Clinical Social Worker facilitated patient discharge including contacting patient family and facility to confirm patient discharge plans.  Clinical information faxed to facility and family agreeable with plan.  CSW arranged ambulance transport via [facility] to [facility].  RN to call report prior to discharge.  Clinical Social Worker will sign off for now as social work intervention is no longer needed. Please consult us again if new need arises.  Jesse Jeilani Grupe, LCSW [phone] 

## 2013-09-06 NOTE — Progress Notes (Signed)
[id number] Cardiac Rehab Completed discharge education with pt. He voices understanding. I attempted to put on recovery from heart surgery video on for him to watch, system not working. Deon Pilling, RN 09/06/2013 10:17 AM

## 2013-09-09 ENCOUNTER — Non-Acute Institutional Stay (SKILLED_NURSING_FACILITY): Payer: Medicare Other | Admitting: Internal Medicine

## 2013-09-09 ENCOUNTER — Other Ambulatory Visit: Payer: Self-pay | Admitting: *Deleted

## 2013-09-09 ENCOUNTER — Encounter: Payer: Self-pay | Admitting: Internal Medicine

## 2013-09-09 DIAGNOSIS — I214 Non-ST elevation (NSTEMI) myocardial infarction: Secondary | ICD-10-CM

## 2013-09-09 DIAGNOSIS — N186 End stage renal disease: Secondary | ICD-10-CM

## 2013-09-09 DIAGNOSIS — J9383 Other pneumothorax: Secondary | ICD-10-CM

## 2013-09-09 DIAGNOSIS — E119 Type 2 diabetes mellitus without complications: Secondary | ICD-10-CM

## 2013-09-09 DIAGNOSIS — E785 Hyperlipidemia, unspecified: Secondary | ICD-10-CM

## 2013-09-09 DIAGNOSIS — Z992 Dependence on renal dialysis: Secondary | ICD-10-CM

## 2013-09-09 DIAGNOSIS — Z951 Presence of aortocoronary bypass graft: Secondary | ICD-10-CM

## 2013-09-09 DIAGNOSIS — I5043 Acute on chronic combined systolic (congestive) and diastolic (congestive) heart failure: Secondary | ICD-10-CM

## 2013-09-09 DIAGNOSIS — I4891 Unspecified atrial fibrillation: Secondary | ICD-10-CM

## 2013-09-09 DIAGNOSIS — I509 Heart failure, unspecified: Secondary | ICD-10-CM

## 2013-09-09 MED ORDER — OXYCODONE HCL 5 MG PO TABS: ORAL_TABLET | ORAL | Status: DC

## 2013-09-09 NOTE — Assessment & Plan Note (Signed)
Post surgery complication txed with IV amiodarone and converted to NSR

## 2013-09-09 NOTE — Progress Notes (Signed)
MRN: [id number] Name: [name] [name]  Sex: male Age: 68 y.o. DOB: Jun 08, 1945  [place] #: [name] Facility/Room: 216A Level Of Care: SNF Provider: Inocencio Homes D Emergency Contacts: Extended Emergency Contact Information Primary Emergency Contact: [name] [name] Address: [address]          [place], [place] [address] [name] [name] of [place] Phone: [phone] Mobile Phone: [phone] Relation: Sister Secondary Emergency Contact: [name] [name] of [place] Mobile Phone: [phone] Relation: Niece  Code Status: FULL  Allergies: Ace inhibitors  Chief Complaint  Patient presents with  . New Admit To SNF    HPI: Patient is 68 y.o. male who has undergone a CABG X 5 after a NSTEMI.  Past Medical History  Diagnosis Date  . Hypertension   . Chronic renal insufficiency   . Joint pain   . Gout   . GERD (gastroesophageal reflux disease)   . Arthritis   . Prostate cancer     adenocarcinoma gleason 7  . Bladder neck contracture   . Elevated prostate specific antigen (PSA)   . Urethral stricture unspecified   . Murmur, cardiac 04/21/2013  . CHF (congestive heart failure)   . Shortness of breath   . Dialysis patient   . Hyperlipidemia   . Diabetes mellitus     Past Surgical History  Procedure Laterality Date  . Appendectomy  68 yrs old    open  . Av fistula placement, brachiocephalic  2536  . Robot assisted laparoscopic radical prostatectomy N/A 05/18/2012    Procedure: ROBOTIC ASSISTED LAPAROSCOPIC RADICAL PROSTATECTOMY;  Surgeon: Alexis Frock, MD;  Location: [facility];  Service: Urology;  Laterality: N/A;  . Lymphadenectomy Bilateral 05/18/2012    Procedure: LYMPHADENECTOMY;  Surgeon: Alexis Frock, MD;  Location: [facility];  Service: Urology;  Laterality: Bilateral;  . Circumcision, non-newborn    . Kidney surgery    . Pelvic laparoscopy    . Coronary artery bypass graft N/A 08/19/2013    Procedure: CORONARY ARTERY BYPASS GRAFTING (CABG) times 5 using  left internal mammary artery to LAD, saphenous vein graft to diagonal; sequential saphenous vein graft to intermediate and distal circumflex; and saphenous vein graft to right coronary artery ;  Surgeon: [name], MD;  Location: [facility];  Service: Open Heart Surgery;  Laterality: N/A;  . Intraoperative transesophageal echocardiogram N/A 08/19/2013    Procedure: INTRAOPERATIVE TRANSESOPHAGEAL ECHOCARDIOGRAM;  Surgeon: [name], MD;  Location: [facility];  Service: Open Heart Surgery;  Laterality: N/A;  . Endovein harvest of greater saphenous vein Left 08/19/2013    Procedure: ENDOVEIN HARVEST OF GREATER SAPHENOUS VEIN from left thigh and calf;  Surgeon: [name], MD;  Location: [facility];  Service: Open Heart Surgery;  Laterality: Left;      Medication List       This list is accurate as of: 09/09/13  8:49 PM.  Always use your most recent med list.               amiodarone 200 MG tablet  Commonly known as:  PACERONE  Take 1 tablet (200 mg total) by mouth daily.     aspirin 325 MG EC tablet  Take 1 tablet (325 mg total) by mouth daily.     atorvastatin 20 MG tablet  Commonly known as:  LIPITOR  Take 20 mg by mouth every morning.     darbepoetin 25 MCG/0.42ML Soln injection  Commonly known as:  ARANESP  Inject 0.42 mLs (25 mcg total) into the vein every Thursday  with hemodialysis.     ferric gluconate 125 mg in sodium chloride 0.9 % 100 mL  Inject 125 mg into the vein Every Tuesday,Thursday,and Saturday with dialysis.     metoprolol tartrate 12.5 mg Tabs tablet  Commonly known as:  LOPRESSOR  Take 0.5 tablets (12.5 mg total) by mouth 2 (two) times daily.     naphazoline-pheniramine 0.025-0.3 % ophthalmic solution  Commonly known as:  NAPHCON-A  Place 1 drop into both eyes 4 (four) times daily as needed for irritation.     oxyCODONE 5 MG immediate release tablet  Commonly known as:  Oxy IR/ROXICODONE  Take one tablet by mouth every four hours as needed for severe  pain     pantoprazole 40 MG tablet  Commonly known as:  PROTONIX  Take 1 tablet (40 mg total) by mouth daily before breakfast.     polyethylene glycol packet  Commonly known as:  MIRALAX / GLYCOLAX  Take 17 g by mouth daily.     sevelamer carbonate 800 MG tablet  Commonly known as:  RENVELA  Take 1,600 mg by mouth 3 (three) times daily with meals.     tamsulosin 0.4 MG Caps capsule  Commonly known as:  FLOMAX  Take 0.4 mg by mouth at bedtime.        No orders of the defined types were placed in this encounter.    Immunization History  Administered Date(s) Administered  . Pneumococcal Polysaccharide-23 04/23/2013    History  Substance Use Topics  . Smoking status: Current Every Day Smoker -- 1.00 packs/day for 40 years    Types: Cigarettes  . Smokeless tobacco: Never Used     Comment: reports smoking approximately 15 cigarettes per day  . Alcohol Use: No    Family history is noncontributory    Review of Systems  DATA OBTAINED: from patient ;pt has no c/o GENERAL: Feels well no fevers, fatigue, appetite changes SKIN: No itching, rash or wounds EYES: No eye pain, redness, discharge EARS: No earache, tinnitus, change in hearing NOSE: No congestion, drainage or bleeding  MOUTH/THROAT: No mouth or tooth pain, No sore throat  RESPIRATORY: No cough, wheezing, SOB CARDIAC: No chest pain, palpitations, lower extremity edema  GI: No abdominal pain, No N/V/D or constipation, No heartburn or reflux  GU: No dysuria, frequency or urgency, or incontinence  MUSCULOSKELETAL: No unrelieved bone/joint pain NEUROLOGIC: No headache, dizziness or focal weakness PSYCHIATRIC: No overt anxiety or sadness. Sleeps well. No behavior issue.   Filed Vitals:   09/09/13 1654  BP: 141/59  Pulse: 84  Temp: 99.5 F (37.5 C)  Resp: 18    Physical Exam  GENERAL APPEARANCE: Alert, conversant. Appropriately groomed. No acute distress; pt looks very good, is going on outing with family   SKIN: No diaphoresis rash; midline incision looks good HEAD: Normocephalic, atraumatic  EYES: Conjunctiva/lids clear. Pupils round, reactive. EOMs intact.  EARS: External exam WNL, canals clear. Hearing grossly normal.  NOSE: No deformity or discharge.  MOUTH/THROAT: Lips w/o lesions  RESPIRATORY: Breathing is even, unlabored. Lung sounds are clear   CARDIOVASCULAR: Heart RRR no murmurs, rubs or gallops. No peripheral edema.   GASTROINTESTINAL: Abdomen is soft, non-tender, not distended w/ normal bowel sounds GENITOURINARY: Bladder non tender, not distended  MUSCULOSKELETAL: No abnormal joints or musculature NEUROLOGIC: Oriented X3. Cranial nerves 2-12 grossly intact. Moves all extremities no tremor. PSYCHIATRIC: Mood and affect appropriate to situation, no behavioral issues  Patient Active Problem List   Diagnosis Date Noted  .  Atrial fibrillation with RVR 09/09/2013  . Spontaneous pneumothorax 09/09/2013  . Hyperlipidemia   . Diabetes mellitus type 2, controlled   . S/P CABG x 5 08/19/2013  . Sustained VT (ventricular tachycardia) 08/14/2013  . NSTEMI (non-ST elevated myocardial infarction) 08/13/2013  . Pulmonary hypertension 04/23/2013  . Systolic and diastolic CHF, acute on chronic 04/23/2013  . Dyspnea 04/21/2013  . Acute respiratory failure 04/21/2013  . Elevated troponin 04/21/2013  . ESRD on dialysis 04/21/2013  . Acute exacerbation of CHF (congestive heart failure) 04/21/2013  . Leukocytosis 04/21/2013  . Acute blood loss anemia 04/21/2013  . Murmur, cardiac 04/21/2013  . 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 Hyperparathyroidism, secondary 08/18/2010    CBC    Component Value Date/Time   WBC 10.0 09/05/2013 1400   RBC 2.42* 09/05/2013 1400   HGB 7.6* 09/05/2013 1400   HCT 22.7* 09/05/2013 1400   PLT 438* 09/05/2013 1400   MCV 93.8 09/05/2013 1400   LYMPHSABS 2.5 12/17/2008  2248   MONOABS 0.7 12/17/2008 2248   EOSABS 0.2 12/17/2008 2248   BASOSABS 0.0 12/17/2008 2248    CMP     Component Value Date/Time   NA 136* 09/05/2013 1400   K 4.9 09/05/2013 1400   CL 93* 09/05/2013 1400   CO2 27 09/05/2013 1400   GLUCOSE 92 09/05/2013 1400   BUN 30* 09/05/2013 1400   CREATININE 10.37* 09/05/2013 1400   CALCIUM 9.4 09/05/2013 1400   CALCIUM 9.4 04/22/2013 2000   PROT 6.6 08/19/2013 0245   ALBUMIN 2.7* 09/05/2013 1400   AST 10 08/19/2013 0245   ALT 7 08/19/2013 0245   ALKPHOS 84 08/19/2013 0245   BILITOT 0.3 08/19/2013 0245   GFRNONAA 5* 09/05/2013 1400   GFRAA 5* 09/05/2013 1400    Assessment and Plan  NSTEMI (non-ST elevated myocardial infarction) Presented with 2 day hx chest discomfort and SOB and ruled in.  Systolic and diastolic CHF, acute on chronic And chronic mitral regurg; pt on lopressor  S/P CABG x 5 After cardiac cath showing 3 vessel dx and dec EF of 40%;post op course uncomplicated except for AF with RVR   Atrial fibrillation with RVR Post surgery complication txed with IV amiodarone and converted to NSR  Spontaneous pneumothorax 100% treated with a chest tube then mini chest tube system;   ESRD on dialysis Pt on T,Th Sat hemo   Acute blood loss anemia Pt's d/c Hb was 7.6 and he is receiving aranesp at dialysis  Diabetes mellitus type 2, controlled A1c 5.9 on no meds  Hyperlipidemia LDL 41, HDL 31 on Lipitor 20 mg    [name] Duos, MD

## 2013-09-09 NOTE — Telephone Encounter (Signed)
[place]

## 2013-09-09 NOTE — Assessment & Plan Note (Signed)
Pt's d/c Hb was 7.6 and he is receiving aranesp at dialysis

## 2013-09-09 NOTE — Assessment & Plan Note (Addendum)
Pt on T,Th Sat hemo

## 2013-09-09 NOTE — Assessment & Plan Note (Signed)
Presented with 2 day hx chest discomfort and SOB and ruled in.

## 2013-09-09 NOTE — Assessment & Plan Note (Signed)
A1c 5.9 on no meds

## 2013-09-09 NOTE — Assessment & Plan Note (Signed)
And chronic mitral regurg; pt on lopressor

## 2013-09-09 NOTE — Assessment & Plan Note (Signed)
After cardiac cath showing 3 vessel dx and dec EF of 40%;post op course uncomplicated except for AF with RVR

## 2013-09-09 NOTE — Assessment & Plan Note (Signed)
LDL 41, HDL 31 on Lipitor 20 mg

## 2013-09-09 NOTE — Assessment & Plan Note (Signed)
100% treated with a chest tube then mini chest tube system;

## 2013-09-12 ENCOUNTER — Non-Acute Institutional Stay (SKILLED_NURSING_FACILITY): Payer: Medicare Other | Admitting: Internal Medicine

## 2013-09-12 ENCOUNTER — Encounter: Payer: Self-pay | Admitting: Internal Medicine

## 2013-09-12 DIAGNOSIS — Z951 Presence of aortocoronary bypass graft: Secondary | ICD-10-CM

## 2013-09-12 DIAGNOSIS — I5043 Acute on chronic combined systolic (congestive) and diastolic (congestive) heart failure: Secondary | ICD-10-CM

## 2013-09-12 DIAGNOSIS — E119 Type 2 diabetes mellitus without complications: Secondary | ICD-10-CM

## 2013-09-12 DIAGNOSIS — I509 Heart failure, unspecified: Secondary | ICD-10-CM

## 2013-09-12 DIAGNOSIS — E785 Hyperlipidemia, unspecified: Secondary | ICD-10-CM

## 2013-09-12 DIAGNOSIS — N186 End stage renal disease: Secondary | ICD-10-CM

## 2013-09-12 DIAGNOSIS — I214 Non-ST elevation (NSTEMI) myocardial infarction: Secondary | ICD-10-CM

## 2013-09-12 DIAGNOSIS — J9383 Other pneumothorax: Secondary | ICD-10-CM

## 2013-09-12 DIAGNOSIS — I4891 Unspecified atrial fibrillation: Secondary | ICD-10-CM

## 2013-09-12 DIAGNOSIS — Z992 Dependence on renal dialysis: Secondary | ICD-10-CM

## 2013-09-12 NOTE — Progress Notes (Signed)
MRN: [id number] Name: [name] [name]  Sex: male Age: 68 y.o. DOB: [birth year]  [place] #: [name] Facility/Room: 216 Level Of Care: SNF Provider: Inocencio Homes D Emergency Contacts: Extended Emergency Contact Information Primary Emergency Contact: Rabine,[name] Address: [address]          [place], [place] [address] [name] of [place] Phone: [phone] Mobile Phone: [phone] Relation: Sister Secondary Emergency Contact: [place] of [place] Phone: [phone] Relation: Niece  Code Status: FULL  Allergies: Ace inhibitors  Chief Complaint  Patient presents with  . Discharge Note    HPI: Patient is 68 y.o. male who was admitted to [facility] s/p CABG X 5, s/p NSTEMI, ESRD on dialysis  who is now ready to go home.  Past Medical History  Diagnosis Date  . Hypertension   . Chronic renal insufficiency   . Joint pain   . Gout   . GERD (gastroesophageal reflux disease)   . Arthritis   . Prostate cancer     adenocarcinoma gleason 7  . Bladder neck contracture   . Elevated prostate specific antigen (PSA)   . Urethral stricture unspecified   . Murmur, cardiac 04/21/2013  . CHF (congestive heart failure)   . Shortness of breath   . Dialysis patient   . Hyperlipidemia   . Diabetes mellitus     Past Surgical History  Procedure Laterality Date  . Appendectomy  68 yrs old    open  . Av fistula placement, brachiocephalic  3244  . Robot assisted laparoscopic radical prostatectomy N/A 05/18/2012    Procedure: ROBOTIC ASSISTED LAPAROSCOPIC RADICAL PROSTATECTOMY;  Surgeon: Alexis Frock, MD;  [facility];  Service: Urology;  Laterality: N/A;  . Lymphadenectomy Bilateral 05/18/2012    Procedure: LYMPHADENECTOMY;  Surgeon: Alexis Frock, MD;  Location[facility];  Service: Urology;  Laterality: Bilateral;  . Circumcision, non-newborn    . Kidney surgery    . Pelvic laparoscopy    . Coronary artery bypass graft N/A 08/19/2013    Procedure:  CORONARY ARTERY BYPASS GRAFTING (CABG) times 5 using left internal mammary artery to LAD, saphenous vein graft to diagonal; sequential saphenous vein graft to intermediate and distal circumflex; and saphenous vein graft to right coronary artery ;  Surgeon: [name], MD;  Location: [facility];  Service: Open Heart Surgery;  Laterality: N/A;  . Intraoperative transesophageal echocardiogram N/A 08/19/2013    Procedure: INTRAOPERATIVE TRANSESOPHAGEAL ECHOCARDIOGRAM;  Surgeon: [name], MD;  Location: [facility];  Service: Open Heart Surgery;  Laterality: N/A;  . Endovein harvest of greater saphenous vein Left 08/19/2013    Procedure: ENDOVEIN HARVEST OF GREATER SAPHENOUS VEIN from left thigh and calf;  Surgeon: [name], MD;  Location: [facility];  Service: Open Heart Surgery;  Laterality: Left;      Medication List       This list is accurate as of: 09/12/13  4:47 PM.  Always use your most recent med list.               amiodarone 200 MG tablet  Commonly known as:  PACERONE  Take 1 tablet (200 mg total) by mouth daily.     aspirin 325 MG EC tablet  Take 1 tablet (325 mg total) by mouth daily.     atorvastatin 20 MG tablet  Commonly known as:  LIPITOR  Take 20 mg by mouth every morning.     darbepoetin 25 MCG/0.42ML Soln injection  Commonly known as:  ARANESP  Inject  0.42 mLs (25 mcg total) into the vein every Thursday with hemodialysis.     ferric gluconate 125 mg in sodium chloride 0.9 % 100 mL  Inject 125 mg into the vein Every Tuesday,Thursday,and Saturday with dialysis.     metoprolol tartrate 12.5 mg Tabs tablet  Commonly known as:  LOPRESSOR  Take 0.5 tablets (12.5 mg total) by mouth 2 (two) times daily.     naphazoline-pheniramine 0.025-0.3 % ophthalmic solution  Commonly known as:  NAPHCON-A  Place 1 drop into both eyes 4 (four) times daily as needed for irritation.     oxyCODONE 5 MG immediate release tablet  Commonly known as:  Oxy IR/ROXICODONE  Take one  tablet by mouth every four hours as needed for severe pain     pantoprazole 40 MG tablet  Commonly known as:  PROTONIX  Take 1 tablet (40 mg total) by mouth daily before breakfast.     polyethylene glycol packet  Commonly known as:  MIRALAX / GLYCOLAX  Take 17 g by mouth daily.     sevelamer carbonate 800 MG tablet  Commonly known as:  RENVELA  Take 1,600 mg by mouth 3 (three) times daily with meals.     tamsulosin 0.4 MG Caps capsule  Commonly known as:  FLOMAX  Take 0.4 mg by mouth at bedtime.        No orders of the defined types were placed in this encounter.    Immunization History  Administered Date(s) Administered  . Pneumococcal Polysaccharide-23 04/23/2013    History  Substance Use Topics  . Smoking status: Current Every Day Smoker -- 1.00 packs/day for 40 years    Types: Cigarettes  . Smokeless tobacco: Never Used     Comment: reports smoking approximately 15 cigarettes per day  . Alcohol Use: No    Filed Vitals:   09/12/13 1636  BP: 118/68  Pulse: 89  Temp: 98.2 F (36.8 C)  Resp: 21    Physical Exam  GENERAL APPEARANCE: Alert, conversant. Appropriately groomed. No acute distress.  HEENT: Unremarkable. RESPIRATORY: Breathing is even, unlabored. Lung sounds are clear   CARDIOVASCULAR: Heart RRR no murmurs, rubs or gallops. No peripheral edema.  GASTROINTESTINAL: Abdomen is soft, non-tender, not distended w/ normal bowel sounds.  NEUROLOGIC: Cranial nerves 2-12 grossly intact. Moves all extremities no tremor.  Patient Active Problem List   Diagnosis Date Noted  . Atrial fibrillation with RVR 09/09/2013  . Spontaneous pneumothorax 09/09/2013  . Hyperlipidemia   . Diabetes mellitus type 2, controlled   . S/P CABG x 5 08/19/2013  . Sustained VT (ventricular tachycardia) 08/14/2013  . NSTEMI (non-ST elevated myocardial infarction) 08/13/2013  . Pulmonary hypertension 04/23/2013  . Systolic and diastolic CHF, acute on chronic 04/23/2013  .  Dyspnea 04/21/2013  . Acute respiratory failure 04/21/2013  . Elevated troponin 04/21/2013  . ESRD on dialysis 04/21/2013  . Acute exacerbation of CHF (congestive heart failure) 04/21/2013  . Leukocytosis 04/21/2013  . Acute blood loss anemia 04/21/2013  . Murmur, cardiac 04/21/2013  . 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 Hyperparathyroidism, secondary 08/18/2010    CBC    Component Value Date/Time   WBC 10.0 09/05/2013 1400   RBC 2.42* 09/05/2013 1400   HGB 7.6* 09/05/2013 1400   HCT 22.7* 09/05/2013 1400   PLT 438* 09/05/2013 1400   MCV 93.8 09/05/2013 1400   LYMPHSABS 2.5 12/17/2008 2248   MONOABS 0.7 12/17/2008 2248  EOSABS 0.2 12/17/2008 2248   BASOSABS 0.0 12/17/2008 2248    CMP     Component Value Date/Time   NA 136* 09/05/2013 1400   K 4.9 09/05/2013 1400   CL 93* 09/05/2013 1400   CO2 27 09/05/2013 1400   GLUCOSE 92 09/05/2013 1400   BUN 30* 09/05/2013 1400   CREATININE 10.37* 09/05/2013 1400   CALCIUM 9.4 09/05/2013 1400   CALCIUM 9.4 04/22/2013 2000   PROT 6.6 08/19/2013 0245   ALBUMIN 2.7* 09/05/2013 1400   AST 10 08/19/2013 0245   ALT 7 08/19/2013 0245   ALKPHOS 84 08/19/2013 0245   BILITOT 0.3 08/19/2013 0245   GFRNONAA 5* 09/05/2013 1400   GFRAA 5* 09/05/2013 1400    Assessment and Plan  Pt is improved ans stable for discharge to home with HH/OT/PT.  Hennie Duos, MD

## 2013-09-17 ENCOUNTER — Other Ambulatory Visit: Payer: Self-pay | Admitting: Cardiothoracic Surgery

## 2013-09-17 DIAGNOSIS — I214 Non-ST elevation (NSTEMI) myocardial infarction: Secondary | ICD-10-CM

## 2013-09-18 ENCOUNTER — Ambulatory Visit (INDEPENDENT_AMBULATORY_CARE_PROVIDER_SITE_OTHER): Payer: Medicare Other | Admitting: Cardiology

## 2013-09-18 ENCOUNTER — Encounter: Payer: Self-pay | Admitting: Cardiology

## 2013-09-18 ENCOUNTER — Telehealth: Payer: Self-pay | Admitting: *Deleted

## 2013-09-18 VITALS — BP 105/62 | HR 79 | Ht 67.0 in | Wt 157.9 lb

## 2013-09-18 DIAGNOSIS — E119 Type 2 diabetes mellitus without complications: Secondary | ICD-10-CM

## 2013-09-18 DIAGNOSIS — D62 Acute posthemorrhagic anemia: Secondary | ICD-10-CM

## 2013-09-18 DIAGNOSIS — I4891 Unspecified atrial fibrillation: Secondary | ICD-10-CM

## 2013-09-18 DIAGNOSIS — I2510 Atherosclerotic heart disease of native coronary artery without angina pectoris: Secondary | ICD-10-CM

## 2013-09-18 DIAGNOSIS — Z992 Dependence on renal dialysis: Secondary | ICD-10-CM

## 2013-09-18 DIAGNOSIS — Z951 Presence of aortocoronary bypass graft: Secondary | ICD-10-CM

## 2013-09-18 DIAGNOSIS — N186 End stage renal disease: Secondary | ICD-10-CM

## 2013-09-18 DIAGNOSIS — E785 Hyperlipidemia, unspecified: Secondary | ICD-10-CM

## 2013-09-18 DIAGNOSIS — J9383 Other pneumothorax: Secondary | ICD-10-CM

## 2013-09-18 NOTE — Patient Instructions (Signed)
  Cardiac Diet A cardiac diet can help stop heart disease or a stroke from happening. It involves eating less unhealthy fats and eating more healthy fats.  FOODS TO AVOID OR LIMIT  Limit saturated fats. This type of fat is found in oils and dairy products, such as:  Coconut oil.  Palm oil.  Cocoa butter.  Butter.  Avoid trans-fat or hydrogenated oils. These are found in fried or pre-made baked goods, such as:  Margarine.  Pre-made cookies, cakes, and crackers.  Limit processed meats (hot dogs, deli meats, sausage) to 3 ounces a week.  Limit high-fat meats (marbled meats, fried chicken, or chicken with skin) to 3 ounces a week.  Limit salt (sodium) to 1500 milligrams a day.   Limit sweets and drinks with added sugar to no more than 5 servings a week. One serving is:  1 tablespoon of sugar.  1 tablespoon of jelly or jam.   cup sorbet.  1 cup lemonade.   cup regular soda. EAT MORE OF THE FOLLOWING FOODS Fruit  Eat 4to 5 servings a day. One serving of fruit is:  1 medium whole fruit.   cup dried fruit.   cup of fresh, frozen, or canned fruit.   cup 100% fruit juice. Vegetables  Eat 4 to 5 servings a day. One serving is:  1 cup raw leafy vegetables.   cup raw or cooked, cut-up vegetables.   cup vegetable juice. Whole Grains  Eat 3 servings a day (1 ounce equals 1 serving). Legumes (such as beans, peas, and lentils)   Eat at least 4 servings a week ( cup equals 1 serving). Nuts and Seeds   Eat at least 4 servings a week ( cup equals 1 serving). Dietary Fiber  Eat 20 to 30 grams a day. Some foods high in dietary fiber include:  Dried beans.  Citrus fruits.  Apples, bananas.  Broccoli, Brussels sprouts, and eggplant.  Oats. Omega-3 Fats  Eat food with omega-3 fats. You can also take a dietary pill (supplement) that has 1 gram of DHA and EPA. Have 3.5 ounces of fatty fish a week, such as:  Salmon.  Mackerel.  Albacore  tuna.  Sardines.  Lake trout.  Herring. PREPARING YOUR FOOD  Broil, bake, steam, or roast foods. Do not fry food. Do not cook food in butter (fat).  Use non-stick cooking sprays.  Remove skin from poultry, such as chicken and [place].  Remove fat from meat.  Take the fat off the top of stews, soups, and gravy.  Use lemon or herbs to flavor food instead of using butter or margarine.  Use nonfat yogurt, salsa, or low-fat dressings for salads. Document Released: 06/28/2011 Document Reviewed: 06/28/2011 [facility] Patient Information 2015 [place]. This information is not intended to replace advice given to you by your health care provider. Make sure you discuss any questions you have with your health care provider.    Your physician recommends that you schedule a follow-up appointment in: 6-7 weeks with Dr. Claiborne Billings.

## 2013-09-18 NOTE — Progress Notes (Signed)
09/22/2013   PCP: Benito Mccreedy, MD   Chief Complaint  Patient presents with  . post hospital    S/P CABG AUGUST 10TH. patient reports no problems . denies chest pain, SOB, nausea.    Primary Cardiologist: Dr. Corky Downs   HPI:  68 year old [name] [name] with recent CABG 08/19/13 and back today for follow up.  His chest tube in place.  He was discharged 08/23/13.  Originally presented with NSTEMI and on cath he had severe 3 vessel disease and EF 40%.   He underwent CABG x 5 utilizing LIMA to LAD, SVG to Diagonal, Sequential SVG to Intermediate and Distal Circumflex, and SVG to RCA. He also underwent Endoscopic Saphenous vein harvest from his left leg.  He did have rapid atrial fib post op. Converted to SR with IV amiodarone.  He also had pneumothorax and chest tube was placed.  He was placed on a mini-express chest tube system.   He continues with dialysis without complications.  Chest tube in place. He is slowly improving.  He tells me he is not smoking, "not yet anyway". Encouraged not to smoke.  He is to follow up with [facility] tomorrow.  No further constipation.  Allergies  Allergen Reactions  . Ace Inhibitors     Current Outpatient Prescriptions  Medication Sig Dispense Refill  . amiodarone (PACERONE) 200 MG tablet Take 1 tablet (200 mg total) by mouth daily.      Marland Kitchen aspirin EC 325 MG EC tablet Take 1 tablet (325 mg total) by mouth daily.  30 tablet  0  . atorvastatin (LIPITOR) 20 MG tablet Take 20 mg by mouth every morning.      . darbepoetin (ARANESP) 25 MCG/0.42ML SOLN injection Inject 0.42 mLs (25 mcg total) into the vein every Thursday with hemodialysis.  14.28 mL    . ferric gluconate 125 mg in sodium chloride 0.9 % 100 mL Inject 125 mg into the vein Every Tuesday,Thursday,and Saturday with dialysis.      Marland Kitchen metoprolol tartrate (LOPRESSOR) 12.5 mg TABS tablet Take 0.5 tablets (12.5 mg total) by mouth 2 (two) times daily.      . naphazoline-pheniramine (NAPHCON-A)  0.025-0.3 % ophthalmic solution Place 1 drop into both eyes 4 (four) times daily as needed for irritation.  15 mL  0  . oxyCODONE (OXY IR/ROXICODONE) 5 MG immediate release tablet Take one tablet by mouth every four hours as needed for severe pain  180 tablet  0  . polyethylene glycol (MIRALAX / GLYCOLAX) packet Take 17 g by mouth daily.  14 each  0  . sevelamer carbonate (RENVELA) 800 MG tablet Take 1,600 mg by mouth 3 (three) times daily with meals.      . tamsulosin (FLOMAX) 0.4 MG CAPS capsule Take 0.4 mg by mouth at bedtime.        No current facility-administered medications for this visit.    Past Medical History  Diagnosis Date  . Hypertension   . Chronic renal insufficiency   . Joint pain   . Gout   . GERD (gastroesophageal reflux disease)   . Arthritis   . Prostate cancer     adenocarcinoma gleason 7  . Bladder neck contracture   . Elevated prostate specific antigen (PSA)   . Urethral stricture unspecified   . Murmur, cardiac 04/21/2013  . CHF (congestive heart failure)   . Shortness of breath   . Dialysis patient   . Hyperlipidemia   . Diabetes  mellitus     Past Surgical History  Procedure Laterality Date  . Appendectomy  68 yrs old    open  . Av fistula placement, brachiocephalic  4132  . Robot assisted laparoscopic radical prostatectomy N/A 05/18/2012    Procedure: ROBOTIC ASSISTED LAPAROSCOPIC RADICAL PROSTATECTOMY;  Surgeon: Alexis Frock, MD;  Location: [facility];  Service: Urology;  Laterality: N/A;  . Lymphadenectomy Bilateral 05/18/2012    Procedure: LYMPHADENECTOMY;  Surgeon: Alexis Frock, MD;  Location: [facility];  Service: Urology;  Laterality: Bilateral;  . Circumcision, non-newborn    . Kidney surgery    . Pelvic laparoscopy    . Coronary artery bypass graft N/A 08/19/2013    Procedure: CORONARY ARTERY BYPASS GRAFTING (CABG) times 5 using left internal mammary artery to LAD, saphenous vein graft to diagonal; sequential saphenous vein graft to intermediate and  distal circumflex; and saphenous vein graft to right coronary artery ;  Surgeon: [name], MD;  Location: [place];  Service: Open Heart Surgery;  Laterality: N/A;  . Intraoperative transesophageal echocardiogram N/A 08/19/2013    Procedure: INTRAOPERATIVE TRANSESOPHAGEAL ECHOCARDIOGRAM;  Surgeon: [name], MD;  Location: [place];  Service: Open Heart Surgery;  Laterality: N/A;  . Endovein harvest of greater saphenous vein Left 08/19/2013    Procedure: ENDOVEIN HARVEST OF GREATER SAPHENOUS VEIN from left thigh and calf;  Surgeon: [name], MD;  Location: [place];  Service: Open Heart Surgery;  Laterality: Left;    GMW:NUUVOZD:GU colds or fevers, no weight changes Skin:no rashes or ulcers HEENT:no blurred vision, no congestion CV:see HPI PUL:see HPI GI:no diarrhea constipation or melena, no indigestion GU:no hematuria, no dysuria MS:no joint pain, no claudication Neuro:no syncope, no lightheadedness Endo:no diabetes, no thyroid disease  Wt Readings from Last 3 Encounters:  09/19/13 157 lb (71.215 kg)  09/18/13 157 lb 14.4 oz (71.623 kg)  09/06/13 153 lb 14.4 oz (69.809 kg)    PHYSICAL EXAM BP 105/62  Pulse 79  Ht 5\' 7"  (1.702 m)  Wt 157 lb 14.4 oz (71.623 kg)  BMI 24.72 kg/m2 General:Pleasant affect, NAD Skin:Warm and dry, brisk capillary refill HEENT:normocephalic, sclera clear, mucus membranes moist Neck:supple, no JVD, no bruits  Heart:S1S2 RRR without murmur, gallup, rub or click Lungs:clear without rales, rhonchi, or wheezes, chest tube lt chest - dressing falling off, re-dressed by staff.  YQI:HKVQ, non tender, + BS, do not palpate liver spleen or masses Ext:no lower ext edema, 2+ pedal pulses, 2+ radial pulses, + AV graft Lt arm + bruit Neuro:alert and oriented, MAE, follows commands, + facial symmetry  EKG:SR LBBB  ASSESSMENT AND PLAN S/P CABG x 5 After NSTEMI, utilizing LIMA to LAD, SVG to Diagonal, Sequential SVG to Intermediate and Distal  Circumflex, and SVG to RCA. He also underwent Endoscopic Saphenous vein harvest from his left leg.  Atrial fibrillation with RVR Maintaining SR with no awareness of rapid HR.  On Amiodarone 200 mg daily.  Spontaneous pneumothorax Chest tube in place, to follow up with [facility] tomorrow  Hyperlipidemia Lipid Panel     Component Value Date/Time   CHOL 87 04/22/2013 0735   TRIG 76 04/22/2013 0735   HDL 31* 04/22/2013 0735   CHOLHDL 2.8 04/22/2013 0735   VLDL 15 04/22/2013 0735   LDLCALC 41 04/22/2013 0735   On Lipitor 20 mg  Diabetes mellitus type 2, controlled Stable per pt  Acute blood loss anemia Labs followed by Renal.   ESRD on dialysis TTS   Follow up with Dr. Corky Downs  in 6 weeks.

## 2013-09-18 NOTE — Telephone Encounter (Signed)
Faxed orders for phase 2 cardiac rehab - early outpatient program - no GXT required

## 2013-09-19 ENCOUNTER — Ambulatory Visit
Admission: RE | Admit: 2013-09-19 | Discharge: 2013-09-19 | Disposition: A | Discharge status: Home / Self Care | Payer: Medicare Other | Source: Ambulatory Visit | Attending: Physician Assistant | Admitting: Physician Assistant

## 2013-09-19 ENCOUNTER — Ambulatory Visit
Admission: RE | Admit: 2013-09-19 | Discharge: 2013-09-19 | Disposition: A | Discharge status: Home / Self Care | Payer: Medicare Other | Source: Ambulatory Visit | Attending: Cardiothoracic Surgery | Admitting: Cardiothoracic Surgery

## 2013-09-19 ENCOUNTER — Ambulatory Visit (INDEPENDENT_AMBULATORY_CARE_PROVIDER_SITE_OTHER): Payer: Self-pay | Admitting: Physician Assistant

## 2013-09-19 ENCOUNTER — Other Ambulatory Visit: Payer: Self-pay | Admitting: Physician Assistant

## 2013-09-19 ENCOUNTER — Ambulatory Visit: Payer: Medicare Other | Admitting: Cardiothoracic Surgery

## 2013-09-19 VITALS — BP 120/68 | HR 88 | Resp 20 | Ht 67.0 in | Wt 157.0 lb

## 2013-09-19 DIAGNOSIS — J939 Pneumothorax, unspecified: Secondary | ICD-10-CM

## 2013-09-19 DIAGNOSIS — I214 Non-ST elevation (NSTEMI) myocardial infarction: Secondary | ICD-10-CM

## 2013-09-19 DIAGNOSIS — Z951 Presence of aortocoronary bypass graft: Secondary | ICD-10-CM

## 2013-09-19 DIAGNOSIS — J9383 Other pneumothorax: Secondary | ICD-10-CM

## 2013-09-19 DIAGNOSIS — I2510 Atherosclerotic heart disease of native coronary artery without angina pectoris: Secondary | ICD-10-CM

## 2013-09-19 NOTE — Progress Notes (Signed)
  HPI: Patient returns for routine postoperative follow-up having undergone CABG x 5 on 08/19/2013. The patient's early postoperative recovery while in the hospital was notable for Left Sided Pneumothorax with persistent air leak, requiring patient to be discharged home with Mini Express.  He also developed post operative Atrial Fibrillation.  He also was discharged to SNF Since hospital discharge the patient reports that he is doing very well.  He was discharged from SNF last Friday.  He has no complaints and for the most part is able to do what he wishes.  He ambulates without difficulty.  He is tolerating a renal diet.  He denies any further episodes of Atrial Fibrillation since hospitalization.   Current Outpatient Prescriptions  Medication Sig Dispense Refill  . amiodarone (PACERONE) 200 MG tablet Take 1 tablet (200 mg total) by mouth daily.      Marland Kitchen aspirin EC 325 MG EC tablet Take 1 tablet (325 mg total) by mouth daily.  30 tablet  0  . atorvastatin (LIPITOR) 20 MG tablet Take 20 mg by mouth every morning.      . darbepoetin (ARANESP) 25 MCG/0.42ML SOLN injection Inject 0.42 mLs (25 mcg total) into the vein every Thursday with hemodialysis.  14.28 mL    . ferric gluconate 125 mg in sodium chloride 0.9 % 100 mL Inject 125 mg into the vein Every Tuesday,Thursday,and Saturday with dialysis.      Marland Kitchen metoprolol tartrate (LOPRESSOR) 12.5 mg TABS tablet Take 0.5 tablets (12.5 mg total) by mouth 2 (two) times daily.      . naphazoline-pheniramine (NAPHCON-A) 0.025-0.3 % ophthalmic solution Place 1 drop into both eyes 4 (four) times daily as needed for irritation.  15 mL  0  . oxyCODONE (OXY IR/ROXICODONE) 5 MG immediate release tablet Take one tablet by mouth every four hours as needed for severe pain  180 tablet  0  . polyethylene glycol (MIRALAX / GLYCOLAX) packet Take 17 g by mouth daily.  14 each  0  . sevelamer carbonate (RENVELA) 800 MG tablet Take 1,600 mg by mouth 3 (three) times daily with  meals.      . tamsulosin (FLOMAX) 0.4 MG CAPS capsule Take 0.4 mg by mouth at bedtime.        No current facility-administered medications for this visit.    Physical Exam:  BP 120/68  Pulse 88  Resp 20  Ht 5\' 7"  (1.702 m)  Wt 157 lb (71.215 kg)  BMI 24.58 kg/m2  SpO2 95%  Gen: no apparent distress Heart: RRR Lungs: CTA bilaterally Skin: incisions healing well, no evidence of infection  Diagnostic Tests:  CXR: post surgical changes, no evidence of pneumothorax on the left, chest tube remains in place  A/P:  1. S/P CABG x 5 doing well 2. Atrial Fibrillation- currently maintaining NSR, Amiodarone is currently at 200 mg daily, can hopefully d/c by Cardiology soon 3. Left Pneumothorax, no air leak appreciated in Mini Express- will d/c chest tube today, get CXR  4. RTC in 2 weeks with CXR to follow up pneumothorax

## 2013-09-22 NOTE — Assessment & Plan Note (Signed)
Chest tube in place, to follow up with [facility] tomorrow

## 2013-09-22 NOTE — Assessment & Plan Note (Signed)
Stable per pt

## 2013-09-22 NOTE — Assessment & Plan Note (Signed)
After NSTEMI, utilizing LIMA to LAD, SVG to Diagonal, Sequential SVG to Intermediate and Distal Circumflex, and SVG to RCA. He also underwent Endoscopic Saphenous vein harvest from his left leg.

## 2013-09-22 NOTE — Assessment & Plan Note (Signed)
Labs followed by Renal.

## 2013-09-22 NOTE — Assessment & Plan Note (Signed)
TTS 

## 2013-09-22 NOTE — Assessment & Plan Note (Signed)
Lipid Panel     Component Value Date/Time   CHOL 87 04/22/2013 0735   TRIG 76 04/22/2013 0735   HDL 31* 04/22/2013 0735   CHOLHDL 2.8 04/22/2013 0735   VLDL 15 04/22/2013 0735   LDLCALC 41 04/22/2013 0735   On Lipitor 20 mg

## 2013-09-22 NOTE — Assessment & Plan Note (Signed)
Maintaining SR with no awareness of rapid HR.  On Amiodarone 200 mg daily.

## 2013-09-26 ENCOUNTER — Telehealth (HOSPITAL_COMMUNITY): Payer: Self-pay | Admitting: *Deleted

## 2013-09-26 NOTE — Telephone Encounter (Signed)
Received signed md order.  Pt called to sign up and assess readiness for cardiac rehab. Contact information provided for return call.

## 2013-09-27 ENCOUNTER — Other Ambulatory Visit: Payer: Self-pay | Admitting: Cardiothoracic Surgery

## 2013-09-27 DIAGNOSIS — I4891 Unspecified atrial fibrillation: Secondary | ICD-10-CM

## 2013-09-30 ENCOUNTER — Ambulatory Visit (INDEPENDENT_AMBULATORY_CARE_PROVIDER_SITE_OTHER): Payer: Self-pay | Admitting: Physician Assistant

## 2013-09-30 ENCOUNTER — Ambulatory Visit
Admission: RE | Admit: 2013-09-30 | Discharge: 2013-09-30 | Disposition: A | Discharge status: Home / Self Care | Payer: Medicare Other | Source: Ambulatory Visit | Attending: Cardiothoracic Surgery | Admitting: Cardiothoracic Surgery

## 2013-09-30 VITALS — BP 126/76 | HR 78 | Resp 20 | Ht 67.0 in | Wt 157.0 lb

## 2013-09-30 DIAGNOSIS — J9383 Other pneumothorax: Secondary | ICD-10-CM

## 2013-09-30 DIAGNOSIS — Z951 Presence of aortocoronary bypass graft: Secondary | ICD-10-CM

## 2013-09-30 DIAGNOSIS — I2510 Atherosclerotic heart disease of native coronary artery without angina pectoris: Secondary | ICD-10-CM

## 2013-09-30 DIAGNOSIS — I4891 Unspecified atrial fibrillation: Secondary | ICD-10-CM

## 2013-09-30 DIAGNOSIS — J939 Pneumothorax, unspecified: Secondary | ICD-10-CM

## 2013-09-30 NOTE — Progress Notes (Signed)
  HPI:  [name] presents for 2 week follow up for chest tube removal.  He is S/P CABG complicated by Pneumothorax with persistent air leak.  He was last evaluated on 09/23/2013 at which time he was not found to have a pneumothorax on CXR.  He also did not exhibit evidence of an air leak in his Mini Express.  His chest tube was therefore removed.  The patient states he continues to do well.  He denies chest pain and shortness of breath.  He is gradually increasing his activity and hopes to start Cardiac rehab soon.  I told the patient from our standpoint he is able to do that and he can contact the [facility] and set up his appointment.  Current Outpatient Prescriptions  Medication Sig Dispense Refill  . amiodarone (PACERONE) 200 MG tablet Take 1 tablet (200 mg total) by mouth daily.      Marland Kitchen aspirin EC 325 MG EC tablet Take 1 tablet (325 mg total) by mouth daily.  30 tablet  0  . atorvastatin (LIPITOR) 20 MG tablet Take 20 mg by mouth every morning.      . darbepoetin (ARANESP) 25 MCG/0.42ML SOLN injection Inject 0.42 mLs (25 mcg total) into the vein every Thursday with hemodialysis.  14.28 mL    . ferric gluconate 125 mg in sodium chloride 0.9 % 100 mL Inject 125 mg into the vein Every Tuesday,Thursday,and Saturday with dialysis.      Marland Kitchen metoprolol tartrate (LOPRESSOR) 12.5 mg TABS tablet Take 0.5 tablets (12.5 mg total) by mouth 2 (two) times daily.      . naphazoline-pheniramine (NAPHCON-A) 0.025-0.3 % ophthalmic solution Place 1 drop into both eyes 4 (four) times daily as needed for irritation.  15 mL  0  . oxyCODONE (OXY IR/ROXICODONE) 5 MG immediate release tablet Take one tablet by mouth every four hours as needed for severe pain  180 tablet  0  . polyethylene glycol (MIRALAX / GLYCOLAX) packet Take 17 g by mouth daily.  14 each  0  . sevelamer carbonate (RENVELA) 800 MG tablet Take 1,600 mg by mouth 3 (three) times daily with meals.      . tamsulosin (FLOMAX) 0.4 MG CAPS  capsule Take 0.4 mg by mouth at bedtime.        No current facility-administered medications for this visit.    Physical Exam:  BP 126/76  Pulse 78  Resp 20  Ht 5\' 7"  (1.702 m)  Wt 157 lb (71.215 kg)  BMI 24.58 kg/m2  SpO2 95%  Gen: no apparent distress Heart: RRR Lungs: CTA bilaterally Skin: incisions well healed, no evidence on infection, chest tube suture remains in place  Diagnostic Tests:  CXR: post surgical changes,   No Evidence of pneumothorax  A/P:  1. Pneumothorax- resolved 2. S/P CABG continues to do well, wishes to start Cardiac Rehab 3. Remove Chest tube suture 4. RTC in 6 weeks with follow up with Dr. Servando [name], patient instructed to contact our office sooner should issues arise

## 2013-10-03 ENCOUNTER — Ambulatory Visit: Payer: Medicare Other | Admitting: Cardiothoracic Surgery

## 2013-10-14 ENCOUNTER — Other Ambulatory Visit: Payer: Self-pay | Admitting: Internal Medicine

## 2013-10-24 ENCOUNTER — Telehealth (HOSPITAL_COMMUNITY): Payer: Self-pay | Admitting: *Deleted

## 2013-10-24 NOTE — Telephone Encounter (Signed)
Pt called in regards to attending cardiac rehab.  Pt is interested in attending.  Pt concerned about the Out of pocket expense along with the finances for diaysis.  Medicare will pay 80% he would owe 20%.  Pt would like to think about it prior to committing to attend.  Pt given contact information. Cherre Huger, BSN

## 2013-10-30 ENCOUNTER — Other Ambulatory Visit: Payer: Self-pay | Admitting: Internal Medicine

## 2013-11-04 ENCOUNTER — Encounter: Payer: Self-pay | Admitting: Cardiovascular Disease

## 2013-11-04 ENCOUNTER — Ambulatory Visit (INDEPENDENT_AMBULATORY_CARE_PROVIDER_SITE_OTHER): Payer: Medicare Other | Admitting: Cardiovascular Disease

## 2013-11-04 VITALS — BP 138/66 | HR 80 | Ht 67.0 in | Wt 163.3 lb

## 2013-11-04 DIAGNOSIS — Z992 Dependence on renal dialysis: Secondary | ICD-10-CM

## 2013-11-04 DIAGNOSIS — T82218A Other mechanical complication of coronary artery bypass graft, initial encounter: Secondary | ICD-10-CM

## 2013-11-04 DIAGNOSIS — E0822 Diabetes mellitus due to underlying condition with diabetic chronic kidney disease: Secondary | ICD-10-CM

## 2013-11-04 DIAGNOSIS — I214 Non-ST elevation (NSTEMI) myocardial infarction: Secondary | ICD-10-CM

## 2013-11-04 DIAGNOSIS — I2510 Atherosclerotic heart disease of native coronary artery without angina pectoris: Secondary | ICD-10-CM

## 2013-11-04 DIAGNOSIS — N186 End stage renal disease: Secondary | ICD-10-CM

## 2013-11-04 DIAGNOSIS — I4891 Unspecified atrial fibrillation: Secondary | ICD-10-CM

## 2013-11-04 MED ORDER — AMIODARONE HCL 100 MG PO TABS: 100.0000 mg | ORAL_TABLET | Freq: Every day | ORAL | Status: DC

## 2013-11-04 NOTE — Patient Instructions (Signed)
Your physician wants you to follow-up in: 4 months with Dr. Claiborne Billings. You will receive a reminder letter in the mail two months in advance. If you don't receive a letter, please call our office to schedule the follow-up appointment.  Your physician has recommended you make the following change in your medication: restart  The amiodarone at 100 mg daily. A new prescription has been sent to your pharmacy.  Your physician recommends that you return for lab work fasting. Nothing to eat or drink after midnight.

## 2013-11-05 ENCOUNTER — Ambulatory Visit (INDEPENDENT_AMBULATORY_CARE_PROVIDER_SITE_OTHER): Payer: Self-pay | Admitting: General Surgery

## 2013-11-05 ENCOUNTER — Encounter: Payer: Self-pay | Admitting: Cardiovascular Disease

## 2013-11-05 DIAGNOSIS — E119 Type 2 diabetes mellitus without complications: Secondary | ICD-10-CM | POA: Insufficient documentation

## 2013-11-05 NOTE — Progress Notes (Signed)
Patient ID: [name] [name], male   DOB: October 07, 1945, 68 y.o.   MRN: [id number]     HPI: [name] [name] is a 68 y.o. male who presents to the office today for a follow up cardiology evaluation following his recent CABG revascularization surgery.  [name] [name] is a 68 year old African-American male who presented to [facility] on 08/13/2013.  He had a history of hypertension, diabetes mellitus, and hyperlipidemia.  He was admitted with recurrent chest pain and ruled in for non-ST segment elevation myocardial infarction with positive troponins.  He also has end-stage renal disease on dialysis.  He was found to have significant coronary obstructive disease at catheterization by Dr. Velva Harman and underwent CABG revascularization surgery 5 on 08/19/2013 by Dr. Pia Mau with a LIMA graft to the LAD, SVG to diagonal, sequential vein graft to the intermediate and distal circumflex, and vein graft to the RCA.  Os operatively he developed atrial fibrillation but ultimately converted to sinus rhythm with IV amiodarone.  He also developed a pneumothorax and required transient chest tube placement.  He was seen in follow-up of his hospitalization on 10/13 2015, by [name] [name], nurse practitioner in our office.  He now presents for cardiology follow-up evaluation.  Since his bypass surgery, he denies recurrent anginal symptoms.  He is on dialysis at [facility] on Tuesday, Thursdays, and Saturdays and is followed by Dr. Jimmy Footman.  He denies awareness of recurrent atrial fibrillation.  He denies PND, orthopnea.  Past Medical History  Diagnosis Date  . Hypertension   . Chronic renal insufficiency   . Joint pain   . Gout   . GERD (gastroesophageal reflux disease)   . Arthritis   . Prostate cancer     adenocarcinoma gleason 7  . Bladder neck contracture   . Elevated prostate specific antigen (PSA)   . Urethral stricture unspecified   . Murmur, cardiac 04/21/2013  . CHF (congestive heart failure)   . Shortness of  breath   . Dialysis patient   . Hyperlipidemia   . Diabetes mellitus     Past Surgical History  Procedure Laterality Date  . Appendectomy  68 yrs old    open  . Av fistula placement, brachiocephalic  [id number]  . Robot assisted laparoscopic radical prostatectomy N/A 05/18/2012    Procedure: ROBOTIC ASSISTED LAPAROSCOPIC RADICAL PROSTATECTOMY;  Surgeon: Alexis Frock, MD;  Location: [facility];  Service: Urology;  Laterality: N/A;  . Lymphadenectomy Bilateral 05/18/2012    Procedure: LYMPHADENECTOMY;  Surgeon: Alexis Frock, MD;  Location: [facility];  Service: Urology;  Laterality: Bilateral;  . Circumcision, non-newborn    . Kidney surgery    . Pelvic laparoscopy    . Coronary artery bypass graft N/A 08/19/2013    Procedure: CORONARY ARTERY BYPASS GRAFTING (CABG) times 5 using left internal mammary artery to LAD, saphenous vein graft to diagonal; sequential saphenous vein graft to intermediate and distal circumflex; and saphenous vein graft to right coronary artery ;  Surgeon: [name], MD;  Location: [place];  Service: Open Heart Surgery;  Laterality: N/A;  . Intraoperative transesophageal echocardiogram N/A 08/19/2013    Procedure: INTRAOPERATIVE TRANSESOPHAGEAL ECHOCARDIOGRAM;  Surgeon: [name], MD;  Location: [place];  Service: Open Heart Surgery;  Laterality: N/A;  . Endovein harvest of greater saphenous vein Left 08/19/2013    Procedure: ENDOVEIN HARVEST OF GREATER SAPHENOUS VEIN from left thigh and calf;  Surgeon: [name], MD;  Location: [place];  Service: Open Heart Surgery;  Laterality: Left;  Allergies  Allergen Reactions  . Ace Inhibitors     Current Outpatient Prescriptions  Medication Sig Dispense Refill  . aspirin EC 325 MG EC tablet Take 1 tablet (325 mg total) by mouth daily.  30 tablet  0  . atorvastatin (LIPITOR) 20 MG tablet Take 20 mg by mouth every morning.      . darbepoetin (ARANESP) 25 MCG/0.42ML SOLN injection Inject 0.42 mLs (25 mcg total) into the  vein every Thursday with hemodialysis.  14.28 mL    . ferric gluconate 125 mg in sodium chloride 0.9 % 100 mL Inject 125 mg into the vein Every Tuesday,Thursday,and Saturday with dialysis.      Marland Kitchen glipiZIDE (GLUCOTROL XL) 2.5 MG 24 hr tablet Take 1 tablet by mouth daily before supper.      . metoprolol tartrate (LOPRESSOR) 12.5 mg TABS tablet Take 0.5 tablets (12.5 mg total) by mouth 2 (two) times daily.      . naphazoline-pheniramine (NAPHCON-A) 0.025-0.3 % ophthalmic solution Place 1 drop into both eyes 4 (four) times daily as needed for irritation.  15 mL  0  . pantoprazole (PROTONIX) 40 MG tablet Take 40 mg by mouth 2 (two) times daily.      . polyethylene glycol (MIRALAX / GLYCOLAX) packet Take 17 g by mouth daily.  14 each  0  . sevelamer carbonate (RENVELA) 800 MG tablet Take 1,600 mg by mouth 3 (three) times daily with meals.      . tamsulosin (FLOMAX) 0.4 MG CAPS capsule Take 0.4 mg by mouth at bedtime.       Marland Kitchen amiodarone (PACERONE) 100 MG tablet Take 1 tablet (100 mg total) by mouth daily.  90 tablet  1   No current facility-administered medications for this visit.    History   Social History  . Marital Status: Legally Separated    Spouse Name: N/A    Number of Children: N/A  . Years of Education: N/A   Occupational History  . Not on file.   Social History Main Topics  . Smoking status: Current Every Day Smoker -- 1.00 packs/day for 40 years    Types: Cigarettes  . Smokeless tobacco: Never Used     Comment: reports smoking approximately 15 cigarettes per day  . Alcohol Use: No  . Drug Use: No  . Sexual Activity: Not on file   Other Topics Concern  . Not on file   Social History Narrative  . No narrative on file    Family History  Problem Relation Age of Onset  . Diabetes Brother   . Kidney disease Brother   . Hypertension Brother   . Stroke Mother   . Hypertension Sister   . Hypertension Brother   . Hypertension Brother   . Hypertension Sister      ROS General: Negative; No fevers, chills, or night sweats HEENT: Negative; No changes in vision or hearing, sinus congestion, difficulty swallowing Pulmonary: Negative; No cough, wheezing, shortness of breath, hemoptysis Cardiovascular: See HPI: No chest pain, presyncope, syncope, palpatations GI: positive for GERD No nausea, vomiting, diarrhea, or abdominal pain GU: Negative; No dysuria, hematuria, or difficulty voiding Renal: End-stage renal disease on dialysis Musculoskeletal: Negative; no myalgias, joint pain, or weakness Hematologic: Negative; no easy bruising, bleeding Endocrine: Negative; no heat/cold intolerance; no diabetes, Neuro: Negative; no changes in balance, headaches Skin: Negative; No rashes or skin lesions Psychiatric: Negative; No behavioral problems, depression Sleep: Negative; No snoring,  daytime sleepiness, hypersomnolence, bruxism, restless legs, hypnogognic hallucinations. Other comprehensive  14 point system review is negative   Physical Exam BP 138/66  Pulse 80  Ht 5\' 7"  (1.702 m)  Wt 163 lb 4.8 oz (74.072 kg)  BMI 25.57 kg/m2 General: Alert, oriented, no distress.  Skin: normal turgor, no rashes, warm and dry HEENT: Normocephalic, atraumatic. Pupils equal round and reactive to light; sclera anicteric; extraocular muscles intact, No lid lag; Nose without nasal septal hypertrophy; Mouth/Parynx benign; Mallinpatti scale 3 Neck: No JVD, no carotid bruits; normal carotid upstroke Lungs: clear to ausculatation and percussion bilaterally; no wheezing or rales, normal inspiratory and expiratory effort Chest wall: without tenderness to palpitation Heart: PMI not displaced, RRR, s1 s2 normal, 2/6 systolic murmur, No diastolic murmur, no rubs, gallops, thrills, or heaves Abdomen: soft, nontender; no hepatosplenomehaly, BS+; abdominal aorta nontender and not dilated by palpation. Back: no CVA tenderness Pulses: 2+; left arm fistula.  Bilateral femoral bruits.   Musculoskeletal: full range of motion, normal strength, no joint deformities Extremities: left arm fistula, no clubbing cyanosis or edema, Homan's sign negative  Neurologic: grossly nonfocal; Cranial nerves grossly wnl Psychologic: Normal mood and affect   ECG (independently read by me):normal sinus rhythm at 80 bpm.  PVC.  Diffuse T-wave changes in V4 through V6 in lead 1 and aVL, which are old  LABS:  BMET    Component Value Date/Time   NA 136* 09/05/2013 1400   K 4.9 09/05/2013 1400   CL 93* 09/05/2013 1400   CO2 27 09/05/2013 1400   GLUCOSE 92 09/05/2013 1400   BUN 30* 09/05/2013 1400   CREATININE 10.37* 09/05/2013 1400   CALCIUM 9.4 09/05/2013 1400   CALCIUM 9.4 04/22/2013 2000   GFRNONAA 5* 09/05/2013 1400   GFRAA 5* 09/05/2013 1400     Hepatic Function Panel     Component Value Date/Time   PROT 6.6 08/19/2013 0245   ALBUMIN 2.7* 09/05/2013 1400   AST 10 08/19/2013 0245   ALT 7 08/19/2013 0245   ALKPHOS 84 08/19/2013 0245   BILITOT 0.3 08/19/2013 0245   BILIDIR 0.1 03/13/2007 2019   IBILI 0.3 03/13/2007 2019     CBC    Component Value Date/Time   WBC 10.0 09/05/2013 1400   RBC 2.42* 09/05/2013 1400   HGB 7.6* 09/05/2013 1400   HCT 22.7* 09/05/2013 1400   PLT 438* 09/05/2013 1400   MCV 93.8 09/05/2013 1400   MCH 31.4 09/05/2013 1400   MCHC 33.5 09/05/2013 1400   RDW 14.3 09/05/2013 1400   LYMPHSABS 2.5 12/17/2008 2248   MONOABS 0.7 12/17/2008 2248   EOSABS 0.2 12/17/2008 2248   BASOSABS 0.0 12/17/2008 2248     BNP    Component Value Date/Time   PROBNP 9802.0* 04/23/2013 0322    Lipid Panel     Component Value Date/Time   CHOL 87 04/22/2013 0735   TRIG 76 04/22/2013 0735   HDL 31* 04/22/2013 0735   CHOLHDL 2.8 04/22/2013 0735   VLDL 15 04/22/2013 0735   LDLCALC 41 04/22/2013 0735     RADIOLOGY: No results found.    ASSESSMENT AND PLAN: Mr. Elmer Merwin is a 68 year old African-American male with end-stage renal disease on hemodialysis who had initially been admitted  to the hospital for months prior to his most recent hospitalization in August.  He ruled in for non-ST segment elevation myocardial infarctions on both occasions.  He is now status post CABG surgery 5.  He denies recurrent anginal symptomatology.  He is maintaining sinus rhythm on amiodarone 20 mg per day, and  I will now decrease this to 100 mg.  Apparently he had run out of this medication for the past 5 days.  He is diabetic on glipizide.  He is on Lipitor for hyperlipidemia.  His blood pressure today is controlled with metoprolol, tartrate 12.5 mg twice a day.  He is not having reflux symptoms on pantoprazole.  I will check laboratory consisting of a CBC, chemistry profile, TSH, and lipid studies.  I will see him in 3-4 months for reevaluation.     Troy Sine, MD, [facility]  11/05/2013 7:29 PM

## 2013-11-06 ENCOUNTER — Ambulatory Visit: Payer: Medicare Other | Admitting: Cardiovascular Disease

## 2013-11-07 ENCOUNTER — Telehealth (HOSPITAL_COMMUNITY): Payer: Self-pay | Admitting: *Deleted

## 2013-11-07 NOTE — Telephone Encounter (Signed)
Checking back with pt to see if he would like to pursue Cardic rehab tihe owing 20% per medicare reimbursement.  Contact information given. Cherre Huger, BSN

## 2013-11-14 ENCOUNTER — Encounter: Payer: Self-pay | Admitting: Cardiothoracic Surgery

## 2013-11-14 ENCOUNTER — Ambulatory Visit (INDEPENDENT_AMBULATORY_CARE_PROVIDER_SITE_OTHER): Payer: Self-pay | Admitting: Cardiothoracic Surgery

## 2013-11-14 VITALS — BP 123/76 | HR 78 | Ht 67.0 in | Wt 163.0 lb

## 2013-11-14 DIAGNOSIS — J9383 Other pneumothorax: Secondary | ICD-10-CM

## 2013-11-14 DIAGNOSIS — Z951 Presence of aortocoronary bypass graft: Secondary | ICD-10-CM

## 2013-11-14 NOTE — Progress Notes (Signed)
[address]       [place] [address]             [phone]      Berthel [facility]iner [facility] Medical Record #[id number] Date of Birth: 24-Jul-1945  Referring: Wellington Hampshire, MD Primary Care: Benito Mccreedy, MD  Chief Complaint:   POST OP FOLLOW UP 08/19/2013 OPERATIVE REPORT PREOPERATIVE DIAGNOSIS: Unstable angina. POSTOPERATIVE DIAGNOSIS: Unstable angina. SURGICAL PROCEDURE: Coronary artery bypass grafting x5 with the left internal mammary to the left anterior descending coronary artery, reverse saphenous vein graft to the diagonal coronary artery, sequential reverse saphenous vein graft to the intermediate coronary artery and distal circumflex, reverse saphenous vein graft to the distal right coronary artery with left thigh and calf endo vein harvesting. SURGEON: Lanelle Bal, MD  History of Present Illness:      Patient doing well postoperatively. His course was, getting by atrial fibrillation and also prolonged air leak and postop pneumothorax. He's had no recurrent chest pain or evidence of congestive heart failure. He does note that postoperatively he became more active and then noticed a inguinal hernia. He is seen Gen. Surgery and is to have this fixed in the near future.    Past Medical History  Diagnosis Date  . Hypertension   . Chronic renal insufficiency   . Joint pain   . Gout   . GERD (gastroesophageal reflux disease)   . Arthritis   . Prostate cancer     adenocarcinoma gleason 7  . Bladder neck contracture   . Elevated prostate specific antigen (PSA)   . Urethral stricture unspecified   . Murmur, cardiac 04/21/2013  . CHF (congestive heart failure)   . Shortness of breath   . Dialysis patient   . Hyperlipidemia   . Diabetes mellitus      History  Smoking status  . Current Every Day Smoker -- 1.00 packs/day for 40 years  . Types: Cigarettes  Smokeless tobacco  . Never Used    Comment: reports smoking  approximately 15 cigarettes per day    History  Alcohol Use No     Allergies  Allergen Reactions  . Ace Inhibitors     Current Outpatient Prescriptions  Medication Sig Dispense Refill  . amiodarone (PACERONE) 100 MG tablet Take 1 tablet (100 mg total) by mouth daily. 90 tablet 1  . aspirin EC 325 MG EC tablet Take 1 tablet (325 mg total) by mouth daily. 30 tablet 0  . atorvastatin (LIPITOR) 20 MG tablet Take 20 mg by mouth every morning.    . darbepoetin (ARANESP) 25 MCG/0.42ML SOLN injection Inject 0.42 mLs (25 mcg total) into the vein every Thursday with hemodialysis. 14.28 mL   . ferric gluconate 125 mg in sodium chloride 0.9 % 100 mL Inject 125 mg into the vein Every Tuesday,Thursday,and Saturday with dialysis.    Marland Kitchen glipiZIDE (GLUCOTROL XL) 2.5 MG 24 hr tablet Take 1 tablet by mouth daily before supper.    . metoprolol tartrate (LOPRESSOR) 12.5 mg TABS tablet Take 0.5 tablets (12.5 mg total) by mouth 2 (two) times daily.    . naphazoline-pheniramine (NAPHCON-A) 0.025-0.3 % ophthalmic solution Place 1 drop into both eyes 4 (four) times daily as needed for irritation. 15 mL 0  . pantoprazole (PROTONIX) 40 MG tablet Take 40 mg by mouth 2 (two) times daily.    . polyethylene glycol (MIRALAX / GLYCOLAX) packet Take 17 g by mouth daily. 14 each 0  . sevelamer carbonate (RENVELA)  800 MG tablet Take 1,600 mg by mouth 3 (three) times daily with meals.    . tamsulosin (FLOMAX) 0.4 MG CAPS capsule Take 0.4 mg by mouth at bedtime.      No current facility-administered medications for this visit.       Physical Exam: BP 123/76 mmHg  Pulse 78  Ht 5\' 7"  (1.702 m)  Wt 163 lb (73.936 kg)  BMI 25.52 kg/m2  SpO2 95%  General appearance: alert and cooperative Neurologic: intact Heart: regular rate and rhythm, S1, S2 normal, no murmur, click, rub or gallop Lungs: clear to auscultation bilaterally Abdomen: soft, non-tender; bowel sounds normal; no masses,  no organomegaly Extremities:  extremities normal, atraumatic, no cyanosis or edema and Homans sign is negative, no sign of DVT Wound: sternum is stable and well healed vein harvest sites are healing well, there is a small amount of eschar on the scope site in the left leg but without infection   Diagnostic Studies & Laboratory data:     Recent Radiology Findings:  No results found.   Recent Lab Findings: Lab Results  Component Value Date   WBC 10.0 09/05/2013   HGB 7.6* 09/05/2013   HCT 22.7* 09/05/2013   PLT 438* 09/05/2013   GLUCOSE 92 09/05/2013   CHOL 87 04/22/2013   TRIG 76 04/22/2013   HDL 31* 04/22/2013   LDLCALC 41 04/22/2013   ALT 7 08/19/2013   AST 10 08/19/2013   NA 136* 09/05/2013   K 4.9 09/05/2013   CL 93* 09/05/2013   CREATININE 10.37* 09/05/2013   BUN 30* 09/05/2013   CO2 27 09/05/2013   TSH 2.760 04/21/2013   INR 1.28 08/19/2013   HGBA1C 5.9* 08/19/2013      Assessment / Plan:     Patient doing well following coronary artery bypass grafting Unfortunately he is continuing to smoke but is dramatically cut back, he was again counseled on importance of stopping Plan to see him back when necessary    [name] MD      [address] [place] [address] Office [phone]   Beeper [phone]  11/14/2013 3:07 PM

## 2013-11-14 NOTE — Patient Instructions (Signed)
    [address]       [place] [address]             [phone]       Coronary Artery Bypass Grafting  Care After  Refer to this sheet in the next few weeks. These instructions provide you with information on caring for yourself after your procedure. Your caregiver may also give you more specific instructions. Your treatment has been planned according to current medical practices, but problems sometimes occur. Call your caregiver if you have any problems or questions after your procedure.  Recovery from open heart surgery will be different for everyone. Some people feel well after 3 or 4 weeks, while for others it takes longer. After heart surgery, it may be normal to:  Not have an appetite, feel nauseated by the smell of food, or only want to eat a small amount.   Be constipated because of changes in your diet, activity, and medicines. Eat foods high in fiber. Add fresh fruits and vegetables to your diet. Stool softeners may be helpful.   Feel sad or unhappy. You may be frustrated or cranky. You may have good days and bad days. Do not give up. Talk to your caregiver if you do not feel better.   Feel weakness and fatigue. You many need physical therapy or cardiac rehabilitation to get your strength back.   Develop an irregular heartbeat called atrial fibrillation. Symptoms of atrial fibrillation are a fast, irregular heartbeat or feelings of fluttery heartbeats, shortness of breath, low blood pressure, and dizziness. If these symptoms develop, see your caregiver right away.  MEDICATION  Have a list of all the medicines you will be taking when you leave the hospital. For every medicine, know the following:   Name.   Exact dose.   Time of day to be taken.   How often it should be taken.   Why you are taking it.   Ask which medicines should or should not be taken together. If you take more than one heart medicine, ask if it is okay to take them together. Some  heart medicines should not be taken at the same time because they may lower your blood pressure too much.   Narcotic pain medicine can cause constipation. Eat fresh fruits and vegetables. Add fiber to your diet. Stool softener medicine may help relieve constipation.   Keep a copy of your medicines with you at all times.   Do not add or stop taking any medicine until you check with your caregiver.   Medicines can have side effects. Call your caregiver who prescribed the medicine if you:   Start throwing up, have diarrhea, or have stomach pain.   Feel dizzy or lightheaded when you stand up.   Feel your heart is skipping beats or is beating too fast or too slow.   Develop a rash.   Notice unusual bruising or bleeding.  HOME CARE INSTRUCTIONS  After heart surgery, it is important to learn how to take your pulse. Have your caregiver show you how to take your pulse.   Use your incentive spirometer. Ask your caregiver how long after surgery you need to use it.  Care of your chest incision  Tell your caregiver right away if you notice clicking in your chest (sternum).   Support your chest with a pillow or your arms when you take deep breaths and cough.   Follow your caregiver's instructions about when you can bathe or   swim.   Protect your incision from sunlight during the first year to keep the scar from getting dark.   Tell your caregiver if you notice:   Increased tenderness of your incision.   Increased redness or swelling around your incision.   Drainage or pus from your incision.  Care of your leg incision(s)  Avoid crossing your legs.   Avoid sitting for long periods of time. Change positions every half hour.   Elevate your leg(s) when you are sitting.   Check your leg(s) daily for swelling. Check the incisions for redness or drainage.   Diet is very important to heart health.   Eat plenty of fresh fruits and vegetables. Meats should be lean cut. Avoid canned,  processed, and fried foods.   Talk to a dietician. They can teach you how to make healthy food and drink choices.  Weight  Weigh yourself every day. This is important because it helps to know if you are retaining fluid that may make your heart and lungs work harder.   Use the same scale each time.   Weigh yourself every morning at the same time. You should do this after you go to the bathroom, but before you eat breakfast.   Your weight will be more accurate if you do not wear any clothes.   Record your weight.   Tell your caregiver if you have gained 2 pounds or more overnight.  Activity Stop any activity at once if you have chest pain, shortness of breath, irregular heartbeats, or dizziness. Get help right away if you have any of these symptoms.  Bathing.  Avoid soaking in a bath or hot tub until your incisions are healed.   Rest. You need a balance of rest and activity.   Exercise. Exercise per your caregiver's advice. You may need physical therapy or cardiac rehabilitation to help strengthen your muscles and build your endurance.   Climbing stairs. Unless your caregiver tells you not to climb stairs, go up stairs slowly and rest if you tire. Do not pull yourself up by the handrail.   Driving a car. Follow your caregiver's advice on when you may drive. You may ride as a passenger at any time. When traveling for long periods of time in a car, get out of the car and walk around for a few minutes every 2 hours.   Lifting. Avoid lifting, pushing, or pulling anything heavier than 10 pounds for 6 weeks after surgery or as told by your caregiver.   Returning to work. Check with your caregiver. People heal at different rates. Most people will be able to go back to work 6 to 12 weeks after surgery.   Sexual activity. You may resume sexual relations as told by your caregiver.  SEEK MEDICAL CARE IF:  Any of your incisions are red, painful, or have any type of drainage coming from them.     You have an oral temperature above 101.5 F .   You have ankle or leg swelling.   You have pain in your legs.   You have weight gain of 2 or more pounds a day.   You feel dizzy or lightheaded when you stand up.  SEEK IMMEDIATE MEDICAL CARE IF:  You have angina or chest pain that goes to your jaw or arms. Call your local emergency services right away.   You have shortness of breath at rest or with activity.   You have a fast or irregular heartbeat (arrhythmia).   There is   a "clicking" in your sternum when you move.   You have numbness or weakness in your arms or legs.  MAKE SURE YOU:  Understand these instructions.   Will watch your condition.   Will get help right away if you are not doing well or get worse.    No lifting over 25 lbs for 3 months 

## 2013-11-21 ENCOUNTER — Encounter: Payer: Self-pay | Admitting: *Deleted

## 2013-11-21 ENCOUNTER — Telehealth: Payer: Self-pay | Admitting: *Deleted

## 2013-11-21 NOTE — Telephone Encounter (Signed)
Faxed surgical clearance to have hernia repair. Also sent through the epic system.

## 2013-11-22 ENCOUNTER — Telehealth (INDEPENDENT_AMBULATORY_CARE_PROVIDER_SITE_OTHER): Payer: Self-pay

## 2013-11-22 NOTE — Telephone Encounter (Signed)
pre-op cardiac clearance rec'd.  Written surgery orders placed in coders tray on 11/22/13 @ 5:56pm

## 2013-11-25 ENCOUNTER — Ambulatory Visit (INDEPENDENT_AMBULATORY_CARE_PROVIDER_SITE_OTHER): Payer: Self-pay | Admitting: General Surgery

## 2013-12-16 ENCOUNTER — Other Ambulatory Visit: Payer: Self-pay | Admitting: Urology

## 2013-12-19 ENCOUNTER — Encounter (HOSPITAL_COMMUNITY): Payer: Self-pay | Admitting: Cardiovascular Disease

## 2014-01-07 NOTE — Pre-Procedure Instructions (Addendum)
TIERNAN SUTO  01/07/2014   Your procedure is scheduled on:  Friday, Jan. 8th   Report to [facility] Admitting at  10:45 AM.  Call this number if you have problems the morning of surgery: [phone]   Remember:   Do not eat food or drink liquids after midnight Thursday.   Take these medicines the morning of surgery with A SIP OF WATER: Metoprolol, Protonix, Flomax, Amiodarone   STOP Aspirin January 3   Do not wear jewelry - no rings or watches.  Do not wear lotions or colognes.   You may NOT wear deodorant the morning of surgery.   Men may shave face and neck.   Do not bring valuables to the hospital.  [facility] is not responsible for any belongings or valuables.               Contacts, dentures or bridgework may not be worn into surgery.  Leave suitcase in the car. After surgery it may be brought to your room.  For patients admitted to the hospital, discharge time is determined by your treatment team.                 Name and phone number of your driver:    Special Instructions: "Preparing for Surgery" instruction sheet.   Please read over the following fact sheets that you were given: Pain Booklet, Coughing and Deep Breathing and Surgical Site Infection Prevention

## 2014-01-08 ENCOUNTER — Encounter (HOSPITAL_COMMUNITY): Payer: Self-pay

## 2014-01-08 ENCOUNTER — Encounter (HOSPITAL_COMMUNITY)
Admission: RE | Admit: 2014-01-08 | Discharge: 2014-01-08 | Disposition: A | Discharge status: Home / Self Care | Payer: Medicare Other | Source: Ambulatory Visit | Attending: General Surgery | Admitting: General Surgery

## 2014-01-08 DIAGNOSIS — E785 Hyperlipidemia, unspecified: Secondary | ICD-10-CM | POA: Insufficient documentation

## 2014-01-08 DIAGNOSIS — I080 Rheumatic disorders of both mitral and aortic valves: Secondary | ICD-10-CM | POA: Insufficient documentation

## 2014-01-08 DIAGNOSIS — M109 Gout, unspecified: Secondary | ICD-10-CM | POA: Insufficient documentation

## 2014-01-08 DIAGNOSIS — I2510 Atherosclerotic heart disease of native coronary artery without angina pectoris: Secondary | ICD-10-CM | POA: Diagnosis not present

## 2014-01-08 DIAGNOSIS — D075 Carcinoma in situ of prostate: Secondary | ICD-10-CM | POA: Insufficient documentation

## 2014-01-08 DIAGNOSIS — Z7982 Long term (current) use of aspirin: Secondary | ICD-10-CM | POA: Diagnosis not present

## 2014-01-08 DIAGNOSIS — Z9079 Acquired absence of other genital organ(s): Secondary | ICD-10-CM | POA: Insufficient documentation

## 2014-01-08 DIAGNOSIS — E119 Type 2 diabetes mellitus without complications: Secondary | ICD-10-CM | POA: Insufficient documentation

## 2014-01-08 DIAGNOSIS — I517 Cardiomegaly: Secondary | ICD-10-CM | POA: Insufficient documentation

## 2014-01-08 DIAGNOSIS — Z01818 Encounter for other preprocedural examination: Secondary | ICD-10-CM | POA: Diagnosis not present

## 2014-01-08 DIAGNOSIS — R011 Cardiac murmur, unspecified: Secondary | ICD-10-CM | POA: Diagnosis not present

## 2014-01-08 DIAGNOSIS — R0602 Shortness of breath: Secondary | ICD-10-CM | POA: Diagnosis not present

## 2014-01-08 DIAGNOSIS — I509 Heart failure, unspecified: Secondary | ICD-10-CM | POA: Diagnosis not present

## 2014-01-08 DIAGNOSIS — N186 End stage renal disease: Secondary | ICD-10-CM | POA: Insufficient documentation

## 2014-01-08 DIAGNOSIS — I120 Hypertensive chronic kidney disease with stage 5 chronic kidney disease or end stage renal disease: Secondary | ICD-10-CM | POA: Insufficient documentation

## 2014-01-08 DIAGNOSIS — M1990 Unspecified osteoarthritis, unspecified site: Secondary | ICD-10-CM | POA: Insufficient documentation

## 2014-01-08 DIAGNOSIS — Z992 Dependence on renal dialysis: Secondary | ICD-10-CM | POA: Diagnosis not present

## 2014-01-08 DIAGNOSIS — K219 Gastro-esophageal reflux disease without esophagitis: Secondary | ICD-10-CM | POA: Diagnosis not present

## 2014-01-08 DIAGNOSIS — F17210 Nicotine dependence, cigarettes, uncomplicated: Secondary | ICD-10-CM | POA: Diagnosis not present

## 2014-01-08 DIAGNOSIS — Z951 Presence of aortocoronary bypass graft: Secondary | ICD-10-CM | POA: Diagnosis not present

## 2014-01-08 HISTORY — DX: End stage renal disease: N18.6

## 2014-01-08 HISTORY — DX: Atherosclerotic heart disease of native coronary artery without angina pectoris: I25.10

## 2014-01-08 HISTORY — DX: Type 2 diabetes mellitus without complications: E11.9

## 2014-01-08 HISTORY — DX: Unilateral inguinal hernia, without obstruction or gangrene, not specified as recurrent: K40.90

## 2014-01-08 LAB — CBC
HEMATOCRIT: 35.7 % — AB (ref 39.0–52.0)
HEMOGLOBIN: 11.4 g/dL — AB (ref 13.0–17.0)
MCH: 31 pg (ref 26.0–34.0)
MCHC: 31.9 g/dL (ref 30.0–36.0)
MCV: 97 fL (ref 78.0–100.0)
Platelets: 228 10*3/uL (ref 150–400)
RBC: 3.68 MIL/uL — ABNORMAL LOW (ref 4.22–5.81)
RDW: 17 % — AB (ref 11.5–15.5)
WBC: 8.9 10*3/uL (ref 4.0–10.5)

## 2014-01-08 LAB — BASIC METABOLIC PANEL
Anion gap: 10 (ref 5–15)
BUN: 31 mg/dL — ABNORMAL HIGH (ref 6–23)
CALCIUM: 9.8 mg/dL (ref 8.4–10.5)
CO2: 32 mmol/L (ref 19–32)
Chloride: 97 mEq/L (ref 96–112)
Creatinine, Ser: 8.71 mg/dL — ABNORMAL HIGH (ref 0.50–1.35)
GFR, EST AFRICAN AMERICAN: 6 mL/min — AB (ref >90)
GFR, EST NON AFRICAN AMERICAN: 6 mL/min — AB (ref >90)
GLUCOSE: 108 mg/dL — AB (ref 70–99)
Potassium: 4.4 mmol/L (ref 3.5–5.1)
SODIUM: 139 mmol/L (ref 135–145)

## 2014-01-08 NOTE — Progress Notes (Signed)
Patient denies CP, shortness of breath during PAT. Cardiac records in Epic

## 2014-01-08 NOTE — Progress Notes (Signed)
Anesthesia Chart Review:  Patient is a 68 year old male scheduled for laparoscopic right IHR on 01/17/14 by Dr. Rosendo Gros.  History includes CAD s/p CABG (LIMA to LAD, SVG to DIAG, SVG to INT and distal CX, SVG to RCA) 08/19/13 with brief post-operative afib and left PTX s/p Mini Express (CT d/c'd 09/19/13), murmur, CHF, SOB, ESRD on HD (TTS), DM2, smoking, HTN, HLD, GERD, prostate cancer s/p robotic assisted laparoscopic prostatectomy, arthritis, gout. PCP is listed as Dr. Iona Beard Osei-Bonsu.  CT surgeon Dr. Servando Snare. Cardiologist is Dr. Shelva Majestic who felt patient was low risk from a cardiac standpoint.  Permission to hold ASA 5 days prior to surgery given.  11/04/13 EKG: SR with first degree AVB, occasional PVC, possible LAE, lateral T wave abnormality consider ischemia (old).   08/16/13 Echo: - Left ventricle: Diffuse hypokinesis worse in the inferior wall. The cavity size was moderately dilated. Wall thickness was increased in a pattern of mild LVH. Systolic function was moderately reduced. The estimated ejection fraction was in the range of 35% to 40%. Doppler parameters are consistent with abnormal left ventricular relaxation (grade 1 diastolic dysfunction). - Aortic valve: There was trivial regurgitation. - Mitral valve: There was mild regurgitation. - Left atrium: The atrium was mildly dilated. - Atrial septum: No defect or patent foramen ovale was identified.  Last cardiac cath was 08/14/13 pre-CABG and showed severe 3V CAD, mildly reduced LV systolic function with EF 40%, normal LVEDP.  08/16/13 carotid duplex: 1-39% ICA stenosis, bilaterally. Right VA flow is antegrade. Left VA flos is not insonated.  09/30/13 CXR: Cardiomegaly. No active disease.  Pre-operative labs noted.  He will get an ISTAT4 on arrival.  If no acute changes and ISTAT results acceptable then I would anticipate that he could proceed as planned.  George Hugh [facility] Short Stay  Center/Anesthesiology Phone [phone] 01/08/2014 12:47 PM

## 2014-01-16 NOTE — Progress Notes (Signed)
Pt. Notified of time change,to arrive at  7:00 AM. Voices understanding.

## 2014-01-17 ENCOUNTER — Encounter (HOSPITAL_COMMUNITY): Payer: Self-pay | Admitting: Surgery

## 2014-01-17 ENCOUNTER — Ambulatory Visit (HOSPITAL_COMMUNITY)
Admission: RE | Admit: 2014-01-17 | Discharge: 2014-01-17 | Disposition: A | Discharge status: Home / Self Care | Payer: Medicare Other | Source: Ambulatory Visit | Attending: General Surgery | Admitting: General Surgery

## 2014-01-17 ENCOUNTER — Ambulatory Visit (HOSPITAL_COMMUNITY): Payer: Medicare Other | Admitting: Anesthesiology

## 2014-01-17 ENCOUNTER — Encounter (HOSPITAL_COMMUNITY)
Admission: RE | Disposition: A | Discharge status: Home / Self Care | Payer: Self-pay | Source: Ambulatory Visit | Attending: General Surgery

## 2014-01-17 ENCOUNTER — Ambulatory Visit (HOSPITAL_COMMUNITY): Payer: Medicare Other | Admitting: Vascular Surgery

## 2014-01-17 DIAGNOSIS — E119 Type 2 diabetes mellitus without complications: Secondary | ICD-10-CM | POA: Insufficient documentation

## 2014-01-17 DIAGNOSIS — K4090 Unilateral inguinal hernia, without obstruction or gangrene, not specified as recurrent: Secondary | ICD-10-CM | POA: Insufficient documentation

## 2014-01-17 DIAGNOSIS — M1990 Unspecified osteoarthritis, unspecified site: Secondary | ICD-10-CM | POA: Insufficient documentation

## 2014-01-17 DIAGNOSIS — E780 Pure hypercholesterolemia: Secondary | ICD-10-CM | POA: Insufficient documentation

## 2014-01-17 DIAGNOSIS — Z888 Allergy status to other drugs, medicaments and biological substances status: Secondary | ICD-10-CM | POA: Diagnosis not present

## 2014-01-17 DIAGNOSIS — N189 Chronic kidney disease, unspecified: Secondary | ICD-10-CM | POA: Insufficient documentation

## 2014-01-17 DIAGNOSIS — F17210 Nicotine dependence, cigarettes, uncomplicated: Secondary | ICD-10-CM | POA: Insufficient documentation

## 2014-01-17 DIAGNOSIS — Z8546 Personal history of malignant neoplasm of prostate: Secondary | ICD-10-CM | POA: Insufficient documentation

## 2014-01-17 DIAGNOSIS — I10 Essential (primary) hypertension: Secondary | ICD-10-CM | POA: Insufficient documentation

## 2014-01-17 HISTORY — PX: INSERTION OF MESH: SHX5868

## 2014-01-17 HISTORY — PX: INGUINAL HERNIA REPAIR: SHX194

## 2014-01-17 LAB — GLUCOSE, CAPILLARY: GLUCOSE-CAPILLARY: 113 mg/dL — AB (ref 70–99)

## 2014-01-17 LAB — POCT I-STAT 4, (NA,K, GLUC, HGB,HCT)
GLUCOSE: 107 mg/dL — AB (ref 70–99)
HEMATOCRIT: 37 % — AB (ref 39.0–52.0)
Hemoglobin: 12.6 g/dL — ABNORMAL LOW (ref 13.0–17.0)
Potassium: 4 mmol/L (ref 3.5–5.1)
Sodium: 140 mmol/L (ref 135–145)

## 2014-01-17 SURGERY — REPAIR, HERNIA, INGUINAL, LAPAROSCOPIC
Anesthesia: General | Site: Groin | Laterality: Right

## 2014-01-17 MED ORDER — SODIUM CHLORIDE 0.9 % IV SOLN: 250.0000 mL | INTRAVENOUS | Status: DC | PRN

## 2014-01-17 MED ORDER — BUPIVACAINE HCL 0.25 % IJ SOLN
INTRAMUSCULAR | Status: DC | PRN
  Administered 2014-01-17: 30 mL

## 2014-01-17 MED ORDER — ACETAMINOPHEN 650 MG RE SUPP: 650.0000 mg | RECTAL | Status: DC | PRN

## 2014-01-17 MED ORDER — OXYCODONE HCL 5 MG PO TABS: 5.0000 mg | ORAL_TABLET | ORAL | Status: DC | PRN

## 2014-01-17 MED ORDER — ACETAMINOPHEN 325 MG PO TABS: 650.0000 mg | ORAL_TABLET | ORAL | Status: DC | PRN

## 2014-01-17 MED ORDER — SODIUM CHLORIDE 0.9 % IJ SOLN: 3.0000 mL | Freq: Two times a day (BID) | INTRAMUSCULAR | Status: DC

## 2014-01-17 MED ORDER — CHLORHEXIDINE GLUCONATE 4 % EX LIQD
1.0000 "application " | Freq: Once | CUTANEOUS | Status: DC
  Filled 2014-01-17: qty 15

## 2014-01-17 MED ORDER — CEFAZOLIN SODIUM-DEXTROSE 2-3 GM-% IV SOLR
INTRAVENOUS | Status: AC
Start: 2014-01-17 — End: 2014-01-17
  Administered 2014-01-17: 2 g via INTRAVENOUS
  Filled 2014-01-17: qty 50

## 2014-01-17 MED ORDER — SODIUM CHLORIDE 0.9 % IJ SOLN: 3.0000 mL | INTRAMUSCULAR | Status: DC | PRN

## 2014-01-17 MED ORDER — HYDROMORPHONE HCL 1 MG/ML IJ SOLN
0.2500 mg | INTRAMUSCULAR | Status: DC | PRN
  Administered 2014-01-17: 0.25 mg via INTRAVENOUS
  Administered 2014-01-17: 0.5 mg via INTRAVENOUS

## 2014-01-17 MED ORDER — PROPOFOL 10 MG/ML IV BOLUS
INTRAVENOUS | Status: AC
  Filled 2014-01-17: qty 20

## 2014-01-17 MED ORDER — BUPIVACAINE HCL (PF) 0.25 % IJ SOLN
INTRAMUSCULAR | Status: AC
  Filled 2014-01-17: qty 30

## 2014-01-17 MED ORDER — SODIUM CHLORIDE 0.9 % IJ SOLN
INTRAMUSCULAR | Status: AC
  Filled 2014-01-17: qty 3

## 2014-01-17 MED ORDER — ROCURONIUM BROMIDE 100 MG/10ML IV SOLN
INTRAVENOUS | Status: DC | PRN
  Administered 2014-01-17 (×2): 10 mg via INTRAVENOUS

## 2014-01-17 MED ORDER — METOPROLOL TARTRATE 12.5 MG HALF TABLET
12.5000 mg | ORAL_TABLET | Freq: Once | ORAL | Status: AC
  Administered 2014-01-17: 12.5 mg via ORAL
  Filled 2014-01-17 (×2): qty 1

## 2014-01-17 MED ORDER — MIDAZOLAM HCL 2 MG/2ML IJ SOLN
INTRAMUSCULAR | Status: AC
  Filled 2014-01-17: qty 2

## 2014-01-17 MED ORDER — NEOSTIGMINE METHYLSULFATE 10 MG/10ML IV SOLN
INTRAVENOUS | Status: DC | PRN
  Administered 2014-01-17: 3 mg via INTRAVENOUS

## 2014-01-17 MED ORDER — HYDROMORPHONE HCL 1 MG/ML IJ SOLN
INTRAMUSCULAR | Status: AC
  Filled 2014-01-17: qty 1

## 2014-01-17 MED ORDER — SODIUM CHLORIDE 0.9 % IV SOLN
INTRAVENOUS | Status: DC
  Administered 2014-01-17: 07:00:00 via INTRAVENOUS

## 2014-01-17 MED ORDER — PROPOFOL 10 MG/ML IV BOLUS
INTRAVENOUS | Status: DC | PRN
  Administered 2014-01-17: 20 mg via INTRAVENOUS
  Administered 2014-01-17: 130 mg via INTRAVENOUS

## 2014-01-17 MED ORDER — PROMETHAZINE HCL 25 MG/ML IJ SOLN: 6.2500 mg | INTRAMUSCULAR | Status: DC | PRN

## 2014-01-17 MED ORDER — SUCCINYLCHOLINE CHLORIDE 20 MG/ML IJ SOLN
INTRAMUSCULAR | Status: DC | PRN
  Administered 2014-01-17: 100 mg via INTRAVENOUS

## 2014-01-17 MED ORDER — ONDANSETRON HCL 4 MG/2ML IJ SOLN
INTRAMUSCULAR | Status: DC | PRN
  Administered 2014-01-17: 4 mg via INTRAVENOUS

## 2014-01-17 MED ORDER — OXYCODONE-ACETAMINOPHEN 5-325 MG PO TABS: 1.0000 | ORAL_TABLET | ORAL | Status: DC | PRN

## 2014-01-17 MED ORDER — GLYCOPYRROLATE 0.2 MG/ML IJ SOLN
INTRAMUSCULAR | Status: DC | PRN
  Administered 2014-01-17: 0.4 mg via INTRAVENOUS

## 2014-01-17 MED ORDER — FENTANYL CITRATE 0.05 MG/ML IJ SOLN
INTRAMUSCULAR | Status: AC
  Filled 2014-01-17: qty 5

## 2014-01-17 MED ORDER — 0.9 % SODIUM CHLORIDE (POUR BTL) OPTIME
TOPICAL | Status: DC | PRN
  Administered 2014-01-17: 1000 mL

## 2014-01-17 MED ORDER — LIDOCAINE HCL (CARDIAC) 20 MG/ML IV SOLN
INTRAVENOUS | Status: DC | PRN
  Administered 2014-01-17: 60 mg via INTRAVENOUS

## 2014-01-17 MED ORDER — FENTANYL CITRATE 0.05 MG/ML IJ SOLN
INTRAMUSCULAR | Status: DC | PRN
  Administered 2014-01-17: 50 ug via INTRAVENOUS

## 2014-01-17 MED ORDER — EPHEDRINE SULFATE 50 MG/ML IJ SOLN
INTRAMUSCULAR | Status: DC | PRN
  Administered 2014-01-17: 10 mg via INTRAVENOUS
  Administered 2014-01-17: 5 mg via INTRAVENOUS

## 2014-01-17 MED ORDER — CEFAZOLIN SODIUM-DEXTROSE 2-3 GM-% IV SOLR: 2.0000 g | INTRAVENOUS | Status: DC

## 2014-01-17 MED ORDER — PHENYLEPHRINE HCL 10 MG/ML IJ SOLN
INTRAMUSCULAR | Status: DC | PRN
  Administered 2014-01-17: 80 ug via INTRAVENOUS

## 2014-01-17 SURGICAL SUPPLY — 41 items
APL SKNCLS STERI-STRIP NONHPOA (GAUZE/BANDAGES/DRESSINGS) ×1
APPLIER CLIP 5 13 M/L LIGAMAX5 (MISCELLANEOUS)
APR CLP MED LRG 5 ANG JAW (MISCELLANEOUS)
BENZOIN TINCTURE PRP APPL 2/3 (GAUZE/BANDAGES/DRESSINGS) ×3 IMPLANT
CHLORAPREP W/TINT 26ML (MISCELLANEOUS) ×3 IMPLANT
CLIP APPLIE 5 13 M/L LIGAMAX5 (MISCELLANEOUS) IMPLANT
CLOSURE STERI-STRIP 1/2X4 (GAUZE/BANDAGES/DRESSINGS) ×1
CLOSURE WOUND 1/2 X4 (GAUZE/BANDAGES/DRESSINGS) ×1
CLSR STERI-STRIP ANTIMIC 1/2X4 (GAUZE/BANDAGES/DRESSINGS) ×1 IMPLANT
COVER SURGICAL LIGHT HANDLE (MISCELLANEOUS) ×3 IMPLANT
DISSECTOR BLUNT TIP ENDO 5MM (MISCELLANEOUS) IMPLANT
DRAPE LAPAROSCOPIC ABDOMINAL (DRAPES) ×3 IMPLANT
ELECT REM PT RETURN 9FT ADLT (ELECTROSURGICAL) ×3
ELECTRODE REM PT RTRN 9FT ADLT (ELECTROSURGICAL) ×1 IMPLANT
GAUZE SPONGE 2X2 8PLY STRL LF (GAUZE/BANDAGES/DRESSINGS) ×1 IMPLANT
GLOVE BIO SURGEON STRL SZ7.5 (GLOVE) ×3 IMPLANT
GOWN STRL REUS W/ TWL LRG LVL3 (GOWN DISPOSABLE) ×2 IMPLANT
GOWN STRL REUS W/ TWL XL LVL3 (GOWN DISPOSABLE) ×1 IMPLANT
GOWN STRL REUS W/TWL LRG LVL3 (GOWN DISPOSABLE) ×6
GOWN STRL REUS W/TWL XL LVL3 (GOWN DISPOSABLE) ×3
KIT BASIN OR (CUSTOM PROCEDURE TRAY) ×3 IMPLANT
KIT ROOM TURNOVER OR (KITS) ×3 IMPLANT
MESH 3DMAX 4X6 RT LRG (Mesh General) ×2 IMPLANT
NS IRRIG 1000ML POUR BTL (IV SOLUTION) ×3 IMPLANT
PAD ARMBOARD 7.5X6 YLW CONV (MISCELLANEOUS) ×6 IMPLANT
RELOAD STAPLE 4.0 BLU F/HERNIA (INSTRUMENTS) ×1 IMPLANT
RELOAD STAPLE 4.8 BLK F/HERNIA (STAPLE) IMPLANT
RELOAD STAPLE HERNIA 4.0 BLUE (INSTRUMENTS) ×6 IMPLANT
RELOAD STAPLE HERNIA 4.8 BLK (STAPLE) IMPLANT
SCISSORS LAP 5X35 DISP (ENDOMECHANICALS) ×3 IMPLANT
SET TROCAR LAP APPLE-HUNT 5MM (ENDOMECHANICALS) ×3 IMPLANT
SPONGE GAUZE 2X2 STER 10/PKG (GAUZE/BANDAGES/DRESSINGS) ×2
STAPLER HERNIA 12 8.5 360D (INSTRUMENTS) ×3 IMPLANT
STRIP CLOSURE SKIN 1/2X4 (GAUZE/BANDAGES/DRESSINGS) ×2 IMPLANT
SUT MNCRL AB 4-0 PS2 18 (SUTURE) ×3 IMPLANT
TOWEL OR 17X24 6PK STRL BLUE (TOWEL DISPOSABLE) ×3 IMPLANT
TOWEL OR 17X26 10 PK STRL BLUE (TOWEL DISPOSABLE) ×3 IMPLANT
TRAY FOLEY CATH 16FR SILVER (SET/KITS/TRAYS/PACK) ×3 IMPLANT
TRAY LAPAROSCOPIC (CUSTOM PROCEDURE TRAY) ×3 IMPLANT
TROCAR XCEL 12X100 BLDLESS (ENDOMECHANICALS) ×3 IMPLANT
TUBING INSUFFLATION (TUBING) ×3 IMPLANT

## 2014-01-17 NOTE — Transfer of Care (Signed)
Immediate Anesthesia Transfer of Care Note  Patient: [name] [name]  Procedure(s) Performed: Procedure(s): LAPAROSCOPIC RIGHT INGUINAL HERNIA REPAIR  (Right) INSERTION OF MESH (Right)  Patient Location: PACU  Anesthesia Type:General  Level of Consciousness: awake, alert  and oriented  Airway & Oxygen Therapy: Patient Spontanous Breathing and Patient connected to nasal cannula oxygen  Post-op Assessment: Report given to PACU RN and Post -op Vital signs reviewed and stable  Post vital signs: Reviewed and stable  Complications: No apparent anesthesia complications

## 2014-01-17 NOTE — Anesthesia Postprocedure Evaluation (Signed)
  Anesthesia Post-op Note  Patient: [name] [name]  Procedure(s) Performed: Procedure(s): LAPAROSCOPIC RIGHT INGUINAL HERNIA REPAIR  (Right) INSERTION OF MESH (Right)  Patient Location: PACU  Anesthesia Type:General  Level of Consciousness: awake and alert   Airway and Oxygen Therapy: Patient Spontanous Breathing  Post-op Pain: mild  Post-op Assessment: Post-op Vital signs reviewed  Post-op Vital Signs: stable  Last Vitals:  Filed Vitals:   01/17/14 1210  BP: 119/55  Pulse: 58  Temp: 36.5 C  Resp: 20    Complications: No apparent anesthesia complications

## 2014-01-17 NOTE — Discharge Instructions (Signed)
[facility] _______[facility] ° °INGUINAL HERNIA REPAIR: POST OP INSTRUCTIONS ° °Always review your discharge instruction sheet given to you by the facility where your surgery was performed. °IF YOU HAVE DISABILITY OR FAMILY LEAVE FORMS, YOU MUST BRING THEM TO THE OFFICE FOR PROCESSING.   °DO NOT GIVE THEM TO YOUR DOCTOR. ° °1. A  prescription for pain medication may be given to you upon discharge.  Take your pain medication as prescribed, if needed.  If narcotic pain medicine is not needed, then you may take acetaminophen (Tylenol) or ibuprofen (Advil) as needed. °2. Take your usually prescribed medications unless otherwise directed. °3. If you need a refill on your pain medication, please contact your pharmacy.  They will contact our office to request authorization. Prescriptions will not be filled after 5 pm or on week-ends. °4. You should follow a light diet the first 24 hours after arrival home, such as soup and crackers, etc.  Be sure to include lots of fluids daily.  Resume your normal diet the day after surgery. °5. Most patients will experience some swelling and bruising around the umbilicus or in the groin and scrotum.  Ice packs and reclining will help.  Swelling and bruising can take several days to resolve.  °6. It is common to experience some constipation if taking pain medication after surgery.  Increasing fluid intake and taking a stool softener (such as Colace) will usually help or prevent this problem from occurring.  A mild laxative (Milk of Magnesia or Miralax) should be taken according to package directions if there are no bowel movements after 48 hours. °7. Unless discharge instructions indicate otherwise, you may remove your bandages 24-48 hours after surgery, and you may shower at that time.  You may have steri-strips (small skin tapes) in place directly over the incision.  These strips should be left on the skin for 7-10 days.  If your surgeon used skin glue on the incision, you  may shower in 24 hours.  The glue will flake off over the next 2-3 weeks.  Any sutures or staples will be removed at the office during your follow-up visit. °8. ACTIVITIES:  You may resume regular (light) daily activities beginning the next day--such as daily self-care, walking, climbing stairs--gradually increasing activities as tolerated.  You may have sexual intercourse when it is comfortable.  Refrain from any heavy lifting or straining until approved by your doctor. °a. You may drive when you are no longer taking prescription pain medication, you can comfortably wear a seatbelt, and you can safely maneuver your car and apply brakes. °b. RETURN TO WORK:  __________________________________________________________ °9. You should see your doctor in the office for a follow-up appointment approximately 2-3 weeks after your surgery.  Make sure that you call for this appointment within a day or two after you arrive home to insure a convenient appointment time. °10. OTHER INSTRUCTIONS:  __________________________________________________________________________________________________________________________________________________________________________________________  °WHEN TO CALL YOUR DOCTOR: °1. Fever over 101.0 °2. Inability to urinate °3. Nausea and/or vomiting °4. Extreme swelling or bruising °5. Continued bleeding from incision. °6. Increased pain, redness, or drainage from the incision ° °The clinic staff is available to answer your questions during regular business hours.  Please don’t hesitate to call and ask to speak to one of the nurses for clinical concerns.  If you have a medical emergency, go to the nearest emergency room or call 911.  A surgeon from [facility] is always on call at the hospital ° ° °[address] [place], [place], [place]  [address] ?  [address], [place], [place]   [address] [phone] ? [phone] ? FAX [phone] Web site:  [email or web address]  General Anesthesia, Adult, Care After  Refer to this sheet in the next few weeks. These instructions provide you with information on caring for yourself after your procedure. Your health care provider may also give you more specific instructions. Your treatment has been planned according to current medical practices, but problems sometimes occur. Call your health care provider if you have any problems or questions after your procedure.  WHAT TO EXPECT AFTER THE PROCEDURE  After the procedure, it is typical to experience:  Sleepiness.  Nausea and vomiting. HOME CARE INSTRUCTIONS  For the first 24 hours after general anesthesia:  Have a responsible person with you.  Do not drive a car. If you are alone, do not take public transportation.  Do not drink alcohol.  Do not take medicine that has not been prescribed by your health care provider.  Do not sign important papers or make important decisions.  You may resume a normal diet and activities as directed by your health care provider.  Change bandages (dressings) as directed.  If you have questions or problems that seem related to general anesthesia, call the hospital and ask for the anesthetist or anesthesiologist on call. SEEK MEDICAL CARE IF:  You have nausea and vomiting that continue the day after anesthesia.  You develop a rash. SEEK IMMEDIATE MEDICAL CARE IF:  You have difficulty breathing.  You have chest pain.  You have any allergic problems. Document Released: 04/04/2000 Document Revised: 08/29/2012 Document Reviewed: 07/12/2012  [facility] Patient Information 2014 [place], [place].

## 2014-01-17 NOTE — Anesthesia Procedure Notes (Signed)
Procedure Name: Intubation Date/Time: 01/17/2014 8:55 AM Performed by: Manuela Schwartz B Pre-anesthesia Checklist: Emergency Drugs available, Patient identified, Suction available, Patient being monitored and Timeout performed Patient Re-evaluated:Patient Re-evaluated prior to inductionOxygen Delivery Method: Circle system utilized Preoxygenation: Pre-oxygenation with 100% oxygen Intubation Type: IV induction and Rapid sequence Laryngoscope Size: Mac and 3 Grade View: Grade I Tube type: Oral Tube size: 7.5 mm Number of attempts: 1 Airway Equipment and Method: Stylet Placement Confirmation: ETT inserted through vocal cords under direct vision,  positive ETCO2 and breath sounds checked- equal and bilateral Secured at: 22 cm Tube secured with: Tape Dental Injury: Teeth and Oropharynx as per pre-operative assessment

## 2014-01-17 NOTE — Anesthesia Preprocedure Evaluation (Addendum)
Anesthesia Evaluation  Patient identified by MRN, date of birth, ID band Patient awake    Reviewed: Allergy & Precautions, NPO status , Unable to perform ROS - Chart review only  Airway Mallampati: I       Dental  (+) Dental Advisory Given   Pulmonary Current Smoker,  breath sounds clear to auscultation        Cardiovascular hypertension, + CAD, + Past MI and + CABG Rhythm:Regular Rate:Normal     Neuro/Psych    GI/Hepatic GERD-  ,  Endo/Other  diabetes  Renal/GU Renal disease     Musculoskeletal   Abdominal   Peds  Hematology   Anesthesia Other Findings   Reproductive/Obstetrics                            Anesthesia Physical Anesthesia Plan  ASA: III  Anesthesia Plan:    Post-op Pain Management:    Induction: Intravenous  Airway Management Planned: Oral ETT  Additional Equipment:   Intra-op Plan:   Post-operative Plan: Extubation in OR  Informed Consent: I have reviewed the patients History and Physical, chart, labs and discussed the procedure including the risks, benefits and alternatives for the proposed anesthesia with the patient or authorized representative who has indicated his/her understanding and acceptance.     Plan Discussed with:   Anesthesia Plan Comments:         Anesthesia Quick Evaluation

## 2014-01-17 NOTE — H&P (Signed)
History of Present Illness Ralene Ok MD; 11/05/2013 2:22 PM) Patient words: Inguinal hernia to discuss repair.  The patient is a 69 year old male who presents with an inguinal hernia. The patient is a 69 year old male who is referred for evaluation of a right inguinal hernia. He states this pain for the last 2 months. He states become more bothersome and symptomatic. He is able to reduce it while lying down. He states become more bothersome when he is on his feet more often.  He's had no signs of symptoms of incarceration translation.  The patient recently underwent CABG x5 by Dr. Servando Snare. The patient sees Dr. Claiborne Billings as his cardiologist.   Other Problems Festus Holts, LPN; 67/59/1638 4:66 PM) Arthritis Chronic Renal Failure Syndrome Diabetes Mellitus High blood pressure Hypercholesterolemia Prostate Cancer  Past Surgical History Festus Holts, LPN; 59/93/5701 7:79 PM) Coronary Artery Bypass Graft  Diagnostic Studies History Festus Holts, LPN; 39/03/90 3:30 PM) Colonoscopy never  Allergies Festus Holts, LPN; 07/62/2633 3:54 PM) ACE Inhibitors  Medication History Festus Holts, LPN; 56/25/6389 3:73 PM) OxyCODONE HCl (5MG  Tablet, Oral) Active. Amiodarone HCl (200MG  Tablet, Oral) Active. AmLODIPine Besylate (5MG  Tablet, Oral) Active. Aspirin EC Low Dose (81MG  Tablet DR, Oral) Active. Atorvastatin Calcium (20MG  Tablet, Oral) Active. CloNIDine HCl (0.3MG  Tablet, Oral) Active. GlipiZIDE ER (2.5MG  Tablet ER 24HR, Oral) Active. Metoprolol Tartrate (25MG  Tablet, Oral) Active. Naphcon-A (0.025-0.3% Solution, Ophthalmic) Active. Pantoprazole Sodium (40MG  Tablet DR, Oral) Active. Polyethylene Glycol 3350 (Oral) Active. Rena-Vite (Oral) Active. Tamsulosin HCl (0.4MG  Capsule, Oral) Active. Voltaren (1% Gel, Transdermal) Active. Medications Reconciled  Social History Festus Holts, LPN; 42/87/6811 5:72 PM) Alcohol use Remotely quit alcohol  use. Caffeine use Coffee, Tea. No drug use Tobacco use Current some day smoker.  Review of Systems Festus Holts LPN; 62/03/5595 4:16 PM) General Not Present- Appetite Loss, Chills, Fatigue, Fever, Night Sweats, Weight Gain and Weight Loss. Skin Not Present- Change in Wart/Mole, Dryness, Hives, Jaundice, New Lesions, Non-Healing Wounds, Rash and Ulcer. HEENT Not Present- Earache, Hearing Loss, Hoarseness, Nose Bleed, Oral Ulcers, Ringing in the Ears, Seasonal Allergies, Sinus Pain, Sore Throat, Visual Disturbances, Wears glasses/contact lenses and Yellow Eyes. Respiratory Not Present- Bloody sputum, Chronic Cough, Difficulty Breathing, Snoring and Wheezing. Breast Not Present- Breast Mass, Breast Pain, Nipple Discharge and Skin Changes. Cardiovascular Not Present- Chest Pain, Difficulty Breathing Lying Down, Leg Cramps, Palpitations, Rapid Heart Rate, Shortness of Breath and Swelling of Extremities. Gastrointestinal Not Present- Abdominal Pain, Bloating, Bloody Stool, Change in Bowel Habits, Chronic diarrhea, Constipation, Difficulty Swallowing, Excessive gas, Gets full quickly at meals, Hemorrhoids, Indigestion, Nausea, Rectal Pain and Vomiting. Male Genitourinary Present- Painful Urination and Urine Leakage. Not Present- Blood in Urine, Change in Urinary Stream, Frequency, Impotence, Nocturia and Urgency.   Vitals Festus Holts LPN; 38/45/3646 8:03 PM) 11/05/2013 2:10 PM Weight: 158.5 lb Height: 67in Body Surface Area: 1.84 m Body Mass Index: 24.82 kg/m Temp.: 76F(Temporal)  Pulse: 80 (Regular)  Resp.: 20 (Unlabored)  BP: 142/72 (Sitting, Left Arm, Standard)    Physical Exam Ralene Ok MD; 11/05/2013 2:23 PM) General Mental Status-Alert. General Appearance-Consistent with stated age. Hydration-Well hydrated. Voice-Normal.  Head and Neck Head-normocephalic, atraumatic with no lesions or palpable masses. Trachea-midline.  Eye Eyeball -  Bilateral-Extraocular movements intact. Sclera/Conjunctiva - Bilateral-No scleral icterus.  Chest and Lung Exam Chest and lung exam reveals -quiet, even and easy respiratory effort with no use of accessory muscles and on auscultation, normal breath sounds, no adventitious sounds and normal vocal resonance. Inspection Chest Wall - Normal. Back -  normal.  Cardiovascular Cardiovascular examination reveals -normal heart sounds, regular rate and rhythm with no murmurs and normal pedal pulses bilaterally.  Abdomen Inspection Skin - Scar - no surgical scars. Hernias - Inguinal hernia - Right - Reducible. Palpation/Percussion Palpation and Percussion of the abdomen reveal - Soft, Non Tender, No Rebound tenderness, No Rigidity (guarding) and No hepatosplenomegaly. Auscultation Auscultation of the abdomen reveals - Bowel sounds normal.  Neurologic Neurologic evaluation reveals -alert and oriented x 3 with no impairment of recent or remote memory. Mental Status-Normal.  Musculoskeletal Normal Exam - Left-Upper Extremity Strength Normal and Lower Extremity Strength Normal. Normal Exam - Right-Upper Extremity Strength Normal, Lower Extremity Weakness.    Assessment & Plan Ralene Ok MD; 11/05/2013 2:26 PM) UNILATERAL INGUINAL HERNIA WITHOUT OBSTRUCTION OR GANGRENE, RECURRENCE NOT SPECIFIED (550.90  K40.90) Impression: 68 year old male with a right inguinal hernia.  The patient will like to proceed to the operating room for a laparoscopic right inguinal hernia repair with mesh.  All risks and benefits were discussed with the patient to generally include, but not limited to: infection, bleeding, damage to surrounding structures, acute and chronic nerve pain, and recurrence. Alternatives were offered and described. All questions were answered and the patient voiced understanding of the procedure and wishes to proceed at this point with hernia repair.

## 2014-01-17 NOTE — Progress Notes (Signed)
Report given to sharon rn as caregiver 

## 2014-01-17 NOTE — Op Note (Signed)
01/17/2014  9:59 AM  PATIENT:  [name] [name]  69 y.o. male  PRE-OPERATIVE DIAGNOSIS:  RIGHT INGUINAL HERNIA  POST-OPERATIVE DIAGNOSIS:  RIGHT INGUINAL INDIRECT HERNIA  PROCEDURE:  Procedure(s): LAPAROSCOPIC RIGHT INGUINAL HERNIA REPAIR  (Right) INSERTION OF MESH (Right)  SURGEON:  Surgeon(s) and Role:    * Ralene Ok, MD - Primary  ASSISTANTS: none   ANESTHESIA:   local and general  EBL:     BLOOD ADMINISTERED:none  DRAINS: none   LOCAL MEDICATIONS USED:  BUPIVICAINE   SPECIMEN:  No Specimen  DISPOSITION OF SPECIMEN:  N/A  COUNTS:  YES  TOURNIQUET:  * No tourniquets in log *  DICTATION: .Dragon Dictation   Counts: reported as correct x 2  Findings:  The patient had a small right indirect hernia  Indications for procedure:  The patient is a 69 year old male with a right hernia for several months. Patient complained of symptomatology to his right inguinal area. The patient was taken back for elective inguinal hernia repair.  Details of the procedure: The patient was taken back to the operating room. The patient was placed in supine position with bilateral SCDs in place.  The patient was prepped and draped in the usual sterile fashion.  After appropriate anitbiotics were confirmed, a time-out was confirmed and all facts were verified.  0.25% Marcaine was used to infiltrate the umbilical area. A 11-blade was used to cut down the skin and blunt dissection was used to get the anterior fashion.  The anterior fascia was incised approximately 1 cm and the muscles were retracted laterally. Blunt dissection was then used to create a space in the preperitoneal area. At this time a 10 mm camera was then introduced into the space and advanced the pubic tubercle and a 12 mm trocar was placed over this and insufflation was started.  At this time and space was created from medial to laterally the preperitoneal space.  Cooper's ligament was initially cleaned off.  The hernia sac was  identified in the indirect space. Dissection of the hernia sac was undertaken the vas deferens was identified and protected in all parts of the case.   Once the hernia sac was taken down to approximately the umbilicus a Bard 3D Max mesh was  introduced into the preperitoneal space.  The mesh was brought over the direct and indirect hernia spaces.  This was anchored into place and secured to Cooper's ligament with 4.12mm staples from a Cov[place] hernia stapler. It was anchored to the anterior abdominal wall with 4.8 mm staples. The hernia sac was seen lying anterior to the mesh. There was no staples placed laterally. The insufflation was evacuated. The trochars were removed. The anterior fascia was reapproximated using #1 Vicryl on a UR- 6.  Intra-abdominal air was evacuated and the Veress needle removed. The skin was reapproximated using 4-0 Monocryl subcuticular fashion the patient was awakened from general anesthesia and taken to recovery in stable condition.   PLAN OF CARE: Discharge to home after PACU  PATIENT DISPOSITION:  PACU - hemodynamically stable.   Delay start of Pharmacological VTE agent (>24hrs) due to surgical blood loss or risk of bleeding: not applicable

## 2014-01-20 ENCOUNTER — Encounter (HOSPITAL_COMMUNITY): Payer: Self-pay

## 2014-01-20 ENCOUNTER — Emergency Department (HOSPITAL_COMMUNITY)
Admission: EM | Admit: 2014-01-20 | Discharge: 2014-01-20 | Disposition: A | Discharge status: Home / Self Care | Payer: Medicare Other | Attending: Emergency Medicine | Admitting: Emergency Medicine

## 2014-01-20 DIAGNOSIS — K219 Gastro-esophageal reflux disease without esophagitis: Secondary | ICD-10-CM | POA: Diagnosis not present

## 2014-01-20 DIAGNOSIS — Y848 Other medical procedures as the cause of abnormal reaction of the patient, or of later complication, without mention of misadventure at the time of the procedure: Secondary | ICD-10-CM | POA: Insufficient documentation

## 2014-01-20 DIAGNOSIS — I2510 Atherosclerotic heart disease of native coronary artery without angina pectoris: Secondary | ICD-10-CM | POA: Diagnosis not present

## 2014-01-20 DIAGNOSIS — Z951 Presence of aortocoronary bypass graft: Secondary | ICD-10-CM | POA: Diagnosis not present

## 2014-01-20 DIAGNOSIS — Z791 Long term (current) use of non-steroidal anti-inflammatories (NSAID): Secondary | ICD-10-CM | POA: Diagnosis not present

## 2014-01-20 DIAGNOSIS — Y9289 Other specified places as the place of occurrence of the external cause: Secondary | ICD-10-CM | POA: Insufficient documentation

## 2014-01-20 DIAGNOSIS — Z9889 Other specified postprocedural states: Secondary | ICD-10-CM | POA: Diagnosis not present

## 2014-01-20 DIAGNOSIS — X58XXXA Exposure to other specified factors, initial encounter: Secondary | ICD-10-CM | POA: Insufficient documentation

## 2014-01-20 DIAGNOSIS — I509 Heart failure, unspecified: Secondary | ICD-10-CM | POA: Diagnosis not present

## 2014-01-20 DIAGNOSIS — M1990 Unspecified osteoarthritis, unspecified site: Secondary | ICD-10-CM | POA: Diagnosis not present

## 2014-01-20 DIAGNOSIS — Z7982 Long term (current) use of aspirin: Secondary | ICD-10-CM | POA: Diagnosis not present

## 2014-01-20 DIAGNOSIS — E119 Type 2 diabetes mellitus without complications: Secondary | ICD-10-CM | POA: Insufficient documentation

## 2014-01-20 DIAGNOSIS — Z720 Tobacco use: Secondary | ICD-10-CM | POA: Insufficient documentation

## 2014-01-20 DIAGNOSIS — I120 Hypertensive chronic kidney disease with stage 5 chronic kidney disease or end stage renal disease: Secondary | ICD-10-CM | POA: Insufficient documentation

## 2014-01-20 DIAGNOSIS — Z79891 Long term (current) use of opiate analgesic: Secondary | ICD-10-CM | POA: Insufficient documentation

## 2014-01-20 DIAGNOSIS — Z992 Dependence on renal dialysis: Secondary | ICD-10-CM | POA: Diagnosis not present

## 2014-01-20 DIAGNOSIS — R011 Cardiac murmur, unspecified: Secondary | ICD-10-CM | POA: Insufficient documentation

## 2014-01-20 DIAGNOSIS — Y9389 Activity, other specified: Secondary | ICD-10-CM | POA: Insufficient documentation

## 2014-01-20 DIAGNOSIS — Z79899 Other long term (current) drug therapy: Secondary | ICD-10-CM | POA: Insufficient documentation

## 2014-01-20 DIAGNOSIS — S31119A Laceration without foreign body of abdominal wall, unspecified quadrant without penetration into peritoneal cavity, initial encounter: Secondary | ICD-10-CM | POA: Diagnosis not present

## 2014-01-20 DIAGNOSIS — Z8546 Personal history of malignant neoplasm of prostate: Secondary | ICD-10-CM | POA: Diagnosis not present

## 2014-01-20 DIAGNOSIS — Y998 Other external cause status: Secondary | ICD-10-CM | POA: Diagnosis not present

## 2014-01-20 DIAGNOSIS — N186 End stage renal disease: Secondary | ICD-10-CM | POA: Insufficient documentation

## 2014-01-20 DIAGNOSIS — E785 Hyperlipidemia, unspecified: Secondary | ICD-10-CM | POA: Insufficient documentation

## 2014-01-20 DIAGNOSIS — L7682 Other postprocedural complications of skin and subcutaneous tissue: Secondary | ICD-10-CM | POA: Diagnosis not present

## 2014-01-20 NOTE — Discharge Instructions (Signed)
Tissue Adhesive Wound Care °Some cuts, wounds, lacerations, and incisions can be repaired by using tissue adhesive. Tissue adhesive is like glue. It holds the skin together, allowing for faster healing. It forms a strong bond on the skin in about 1 minute and reaches its full strength in about 2 or 3 minutes. The adhesive disappears naturally while the wound is healing. It is important to take proper care of your wound at home while it heals.  °HOME CARE INSTRUCTIONS  °· Showers are allowed. Do not soak the area containing the tissue adhesive. Do not take baths, swim, or use hot tubs. Do not use any soaps or ointments on the wound. Certain ointments can weaken the glue. °· If a bandage (dressing) has been applied, follow your health care provider's instructions for how often to change the dressing.   °· Keep the dressing dry if one has been applied.   °· Do not scratch, pick, or rub the adhesive.   °· Do not place tape over the adhesive. The adhesive could come off when pulling the tape off.   °· Protect the wound from further injury until it is healed.   °· Protect the wound from sun and tanning bed exposure while it is healing and for several weeks after healing.   °· Only take over-the-counter or prescription medicines as directed by your health care provider.   °· Keep all follow-up appointments as directed by your health care provider. °SEEK IMMEDIATE MEDICAL CARE IF:  °· Your wound becomes red, swollen, hot, or tender.   °· You develop a rash after the glue is applied. °· You have increasing pain in the wound.   °· You have a red streak that goes away from the wound.   °· You have pus coming from the wound.   °· You have increased bleeding. °· You have a fever. °· You have shaking chills.   °· You notice a bad smell coming from the wound.   °· Your wound or adhesive breaks open.   °MAKE SURE YOU:  °· Understand these instructions. °· Will watch your condition. °· Will get help right away if you are not doing  well or get worse. °Document Released: 06/22/2000 Document Revised: 10/17/2012 Document Reviewed: 07/18/2012 °[employer]® Patient Information ©2015 [employer]. This information is not intended to replace advice given to you by your health care provider. Make sure you discuss any questions you have with your health care provider. ° °

## 2014-01-20 NOTE — ED Provider Notes (Signed)
CSN: [id number]     Arrival date & time 01/20/14  7353 History   First MD Initiated Contact with Patient 01/20/14 1004     Chief Complaint  Patient presents with  . Post-op Problem      HPI Per EMS: PT from home. Reports having hernia repair surgery on 1/8 with two lower abdominal incision sites. Pt reports small amount of dark red blood on upper incision site. States that this AM he found the pad on site to have soaked through. Denies pain. Denies fever/chills. Minimal bleeding noted. VSS. AO x4. Hx: dilaysis pt, last received on Saturday Past Medical History  Diagnosis Date  . Hypertension   . Chronic renal insufficiency   . Joint pain   . Gout   . GERD (gastroesophageal reflux disease)   . Arthritis   . Prostate cancer     adenocarcinoma gleason 7  . Bladder neck contracture   . Elevated prostate specific antigen (PSA)   . Urethral stricture unspecified   . Murmur, cardiac 04/21/2013  . CHF (congestive heart failure)   . Shortness of breath   . Dialysis patient   . Hyperlipidemia   . Diabetes mellitus   . Type 2 diabetes mellitus   . ESRD (end stage renal disease)     T, Th, Sat, [employer]  . Coronary artery disease   . Inguinal hernia    Past Surgical History  Procedure Laterality Date  . Appendectomy  69 yrs old    open  . Av fistula placement, brachiocephalic  2992  . Robot assisted laparoscopic radical prostatectomy N/A 05/18/2012    Procedure: ROBOTIC ASSISTED LAPAROSCOPIC RADICAL PROSTATECTOMY;  Surgeon: Alexis Frock, MD;  Location: [facility];  Service: Urology;  Laterality: N/A;  . Lymphadenectomy Bilateral 05/18/2012    Procedure: LYMPHADENECTOMY;  Surgeon: Alexis Frock, MD;  Location: [facility];  Service: Urology;  Laterality: Bilateral;  . Circumcision, non-newborn    . Kidney surgery    . Pelvic laparoscopy    . Coronary artery bypass graft N/A 08/19/2013    Procedure: CORONARY ARTERY BYPASS GRAFTING (CABG) times 5 using left internal mammary artery to LAD,  saphenous vein graft to diagonal; sequential saphenous vein graft to intermediate and distal circumflex; and saphenous vein graft to right coronary artery ;  Surgeon: [name], MD;  Location: [place];  Service: Open Heart Surgery;  Laterality: N/A;  . Intraoperative transesophageal echocardiogram N/A 08/19/2013    Procedure: INTRAOPERATIVE TRANSESOPHAGEAL ECHOCARDIOGRAM;  Surgeon: [name], MD;  Location: [place];  Service: Open Heart Surgery;  Laterality: N/A;  . Endovein harvest of greater saphenous vein Left 08/19/2013    Procedure: ENDOVEIN HARVEST OF GREATER SAPHENOUS VEIN from left thigh and calf;  Surgeon: [name], MD;  Location: [place];  Service: Open Heart Surgery;  Laterality: Left;  . Left heart catheterization with coronary angiogram N/A 08/14/2013    Procedure: LEFT HEART CATHETERIZATION WITH CORONARY ANGIOGRAM;  Surgeon: Wellington Hampshire, MD;  Location: [place] [facility];  Service: Cardiovascular;  Laterality: N/A;   Family History  Problem Relation Age of Onset  . Diabetes Brother   . Kidney disease Brother   . Hypertension Brother   . Stroke Mother   . Hypertension Sister   . Hypertension Brother   . Hypertension Brother   . Hypertension Sister    History  Substance Use Topics  . Smoking status: Current Every Day Smoker -- 0.25 packs/day for 40 years    Types: Cigarettes  .  Smokeless tobacco: Never Used     Comment: reports also using e- cigarette but trying to quit  . Alcohol Use: No    Review of Systems  All other systems reviewed and are negative.     Allergies  Ace inhibitors  Home Medications   Prior to Admission medications   Medication Sig Start Date End Date Taking? Authorizing Provider  amiodarone (PACERONE) 100 MG tablet Take 1 tablet (100 mg total) by mouth daily. 11/04/13   Troy Sine, MD  aspirin EC 325 MG EC tablet Take 1 tablet (325 mg total) by mouth daily. Patient not taking: Reported on 01/06/2014 09/06/13   John Giovanni,  PA-C  aspirin EC 81 MG tablet Take 81 mg by mouth daily.    Historical Provider, MD  atorvastatin (LIPITOR) 20 MG tablet Take 20 mg by mouth every morning.    Historical Provider, MD  darbepoetin (ARANESP) 25 MCG/0.42ML SOLN injection Inject 0.42 mLs (25 mcg total) into the vein every Thursday with hemodialysis. 09/06/13   John Giovanni, PA-C  ferric gluconate 125 mg in sodium chloride 0.9 % 100 mL Inject 125 mg into the vein Every Tuesday,Thursday,and Saturday with dialysis. 09/06/13   [name], PA-C  glipiZIDE (GLUCOTROL XL) 2.5 MG 24 hr tablet Take 1 tablet by mouth daily before supper. 10/30/13   Historical Provider, MD  metoprolol tartrate (LOPRESSOR) 12.5 mg TABS tablet Take 0.5 tablets (12.5 mg total) by mouth 2 (two) times daily. 09/06/13   [name], PA-C  multivitamin (RENA-VIT) TABS tablet Take 1 tablet by mouth daily. 12/10/13   Historical Provider, MD  naphazoline-pheniramine (NAPHCON-A) 0.025-0.3 % ophthalmic solution Place 1 drop into both eyes 4 (four) times daily as needed for irritation. 09/06/13   [name], PA-C  oxyCODONE-acetaminophen (ROXICET) 5-325 MG per tablet Take 1-2 tablets by mouth every 4 (four) hours as needed. 01/17/14   Ralene Ok, MD  pantoprazole (PROTONIX) 40 MG tablet Take 40 mg by mouth 2 (two) times daily.    Historical Provider, MD  polyethylene glycol (MIRALAX / GLYCOLAX) packet Take 17 g by mouth daily. 09/06/13   [name], PA-C  sevelamer carbonate (RENVELA) 800 MG tablet Take 1,600 mg by mouth 3 (three) times daily with meals.    Historical Provider, MD  tamsulosin (FLOMAX) 0.4 MG CAPS capsule Take 0.4 mg by mouth at bedtime.     Historical Provider, MD  VOLTAREN 1 % GEL Take 1 application by mouth daily as needed. 12/24/13   Historical Provider, MD   BP 116/63 mmHg  Pulse 72  Temp(Src) 98 F (36.7 C)  Resp 12  SpO2 98% Physical Exam  Abdominal:      ED Course  LACERATION REPAIR Date/Time: 01/20/2014 10:36 AM Performed by: Dot Lanes Authorized by: Dot Lanes Consent: Verbal consent obtained. Written consent not obtained. Patient identity confirmed: arm band and verbally with patient Body area: trunk Location details: abdomen Laceration length: 0.5 cm Foreign bodies: no foreign bodies Skin closure: glue Approximation: close Approximation difficulty: simple Dressing: 4x4 sterile gauze Patient tolerance: Patient tolerated the procedure well with no immediate complications   (including critical care time)     MDM   Final diagnoses:  Other postoperative complication of skin        Dot Lanes, MD 01/20/14 1125

## 2014-01-20 NOTE — ED Notes (Signed)
Per EMS: PT from home. Reports having hernia repair surgery on 1/8 with two lower abdominal incision sites. Pt reports small amount of dark red blood on upper incision site. States that this AM he found the pad on site to have soaked through. Denies pain. Denies fever/chills. Minimal bleeding noted. VSS. AO x4. Hx: dilaysis pt, last received on Saturday.

## 2014-01-31 ENCOUNTER — Inpatient Hospital Stay (HOSPITAL_COMMUNITY): Admission: RE | Admit: 2014-01-31 | Payer: Medicare Other | Source: Ambulatory Visit

## 2014-01-31 ENCOUNTER — Encounter (HOSPITAL_COMMUNITY): Payer: Self-pay | Admitting: *Deleted

## 2014-02-05 ENCOUNTER — Ambulatory Visit (HOSPITAL_COMMUNITY): Payer: Medicare Other | Admitting: Anesthesiology

## 2014-02-05 ENCOUNTER — Encounter (HOSPITAL_COMMUNITY)
Admission: RE | Disposition: A | Discharge status: Home / Self Care | Payer: Self-pay | Source: Ambulatory Visit | Attending: Urology

## 2014-02-05 ENCOUNTER — Encounter (HOSPITAL_COMMUNITY): Payer: Self-pay | Admitting: *Deleted

## 2014-02-05 ENCOUNTER — Ambulatory Visit (HOSPITAL_COMMUNITY)
Admission: RE | Admit: 2014-02-05 | Discharge: 2014-02-05 | Disposition: A | Discharge status: Home / Self Care | Payer: Medicare Other | Source: Ambulatory Visit | Attending: Urology | Admitting: Urology

## 2014-02-05 DIAGNOSIS — Z8546 Personal history of malignant neoplasm of prostate: Secondary | ICD-10-CM | POA: Insufficient documentation

## 2014-02-05 DIAGNOSIS — R319 Hematuria, unspecified: Secondary | ICD-10-CM | POA: Insufficient documentation

## 2014-02-05 DIAGNOSIS — I4891 Unspecified atrial fibrillation: Secondary | ICD-10-CM | POA: Diagnosis not present

## 2014-02-05 DIAGNOSIS — Z7982 Long term (current) use of aspirin: Secondary | ICD-10-CM | POA: Diagnosis not present

## 2014-02-05 DIAGNOSIS — E213 Hyperparathyroidism, unspecified: Secondary | ICD-10-CM | POA: Insufficient documentation

## 2014-02-05 DIAGNOSIS — I2510 Atherosclerotic heart disease of native coronary artery without angina pectoris: Secondary | ICD-10-CM | POA: Diagnosis not present

## 2014-02-05 DIAGNOSIS — I509 Heart failure, unspecified: Secondary | ICD-10-CM | POA: Diagnosis not present

## 2014-02-05 DIAGNOSIS — E785 Hyperlipidemia, unspecified: Secondary | ICD-10-CM | POA: Diagnosis not present

## 2014-02-05 DIAGNOSIS — N186 End stage renal disease: Secondary | ICD-10-CM | POA: Insufficient documentation

## 2014-02-05 DIAGNOSIS — K219 Gastro-esophageal reflux disease without esophagitis: Secondary | ICD-10-CM | POA: Diagnosis not present

## 2014-02-05 DIAGNOSIS — F17210 Nicotine dependence, cigarettes, uncomplicated: Secondary | ICD-10-CM | POA: Insufficient documentation

## 2014-02-05 DIAGNOSIS — T83718A Erosion of other implanted mesh and other prosthetic materials to surrounding organ or tissue, initial encounter: Secondary | ICD-10-CM | POA: Diagnosis not present

## 2014-02-05 DIAGNOSIS — Z992 Dependence on renal dialysis: Secondary | ICD-10-CM | POA: Insufficient documentation

## 2014-02-05 DIAGNOSIS — Z79899 Other long term (current) drug therapy: Secondary | ICD-10-CM | POA: Insufficient documentation

## 2014-02-05 DIAGNOSIS — I120 Hypertensive chronic kidney disease with stage 5 chronic kidney disease or end stage renal disease: Secondary | ICD-10-CM | POA: Insufficient documentation

## 2014-02-05 DIAGNOSIS — Z951 Presence of aortocoronary bypass graft: Secondary | ICD-10-CM | POA: Insufficient documentation

## 2014-02-05 DIAGNOSIS — M109 Gout, unspecified: Secondary | ICD-10-CM | POA: Insufficient documentation

## 2014-02-05 DIAGNOSIS — E119 Type 2 diabetes mellitus without complications: Secondary | ICD-10-CM | POA: Insufficient documentation

## 2014-02-05 DIAGNOSIS — Y733 Surgical instruments, materials and gastroenterology and urology devices (including sutures) associated with adverse incidents: Secondary | ICD-10-CM | POA: Insufficient documentation

## 2014-02-05 DIAGNOSIS — Y838 Other surgical procedures as the cause of abnormal reaction of the patient, or of later complication, without mention of misadventure at the time of the procedure: Secondary | ICD-10-CM | POA: Insufficient documentation

## 2014-02-05 DIAGNOSIS — I252 Old myocardial infarction: Secondary | ICD-10-CM | POA: Insufficient documentation

## 2014-02-05 HISTORY — PX: HOLMIUM LASER APPLICATION: SHX5852

## 2014-02-05 HISTORY — PX: CYSTOSCOPY W/ RETROGRADES: SHX1426

## 2014-02-05 LAB — CBC
HEMATOCRIT: 37.6 % — AB (ref 39.0–52.0)
HEMOGLOBIN: 11.8 g/dL — AB (ref 13.0–17.0)
MCH: 32.7 pg (ref 26.0–34.0)
MCHC: 31.4 g/dL (ref 30.0–36.0)
MCV: 104.2 fL — ABNORMAL HIGH (ref 78.0–100.0)
Platelets: 271 10*3/uL (ref 150–400)
RBC: 3.61 MIL/uL — AB (ref 4.22–5.81)
RDW: 16.7 % — AB (ref 11.5–15.5)
WBC: 8.7 10*3/uL (ref 4.0–10.5)

## 2014-02-05 LAB — BASIC METABOLIC PANEL
Anion gap: 12 (ref 5–15)
BUN: 36 mg/dL — ABNORMAL HIGH (ref 6–23)
CALCIUM: 9.4 mg/dL (ref 8.4–10.5)
CO2: 30 mmol/L (ref 19–32)
Chloride: 98 mmol/L (ref 96–112)
Creatinine, Ser: 8.33 mg/dL — ABNORMAL HIGH (ref 0.50–1.35)
GFR calc Af Amer: 7 mL/min — ABNORMAL LOW (ref >90)
GFR, EST NON AFRICAN AMERICAN: 6 mL/min — AB (ref >90)
GLUCOSE: 88 mg/dL (ref 70–99)
POTASSIUM: 3.8 mmol/L (ref 3.5–5.1)
Sodium: 140 mmol/L (ref 135–145)

## 2014-02-05 LAB — GLUCOSE, CAPILLARY
Glucose-Capillary: 86 mg/dL (ref 70–99)
Glucose-Capillary: 92 mg/dL (ref 70–99)

## 2014-02-05 SURGERY — CYSTOSCOPY, WITH RETROGRADE PYELOGRAM
Anesthesia: General

## 2014-02-05 MED ORDER — ONDANSETRON HCL 4 MG/2ML IJ SOLN
INTRAMUSCULAR | Status: DC | PRN
Start: 2014-02-05 — End: 2014-02-05
  Administered 2014-02-05: 4 mg via INTRAVENOUS

## 2014-02-05 MED ORDER — DEXTROSE 5 % IV SOLN
INTRAVENOUS | Status: AC
  Filled 2014-02-05: qty 2

## 2014-02-05 MED ORDER — FENTANYL CITRATE 0.05 MG/ML IJ SOLN
INTRAMUSCULAR | Status: DC | PRN
  Administered 2014-02-05: 50 ug via INTRAVENOUS
  Administered 2014-02-05: 25 ug via INTRAVENOUS

## 2014-02-05 MED ORDER — MIDAZOLAM HCL 2 MG/2ML IJ SOLN
INTRAMUSCULAR | Status: AC
  Filled 2014-02-05: qty 2

## 2014-02-05 MED ORDER — ONDANSETRON HCL 4 MG/2ML IJ SOLN
INTRAMUSCULAR | Status: AC
  Filled 2014-02-05: qty 2

## 2014-02-05 MED ORDER — SENNOSIDES-DOCUSATE SODIUM 8.6-50 MG PO TABS: 1.0000 | ORAL_TABLET | Freq: Two times a day (BID) | ORAL | Status: AC

## 2014-02-05 MED ORDER — OXYCODONE-ACETAMINOPHEN 5-325 MG PO TABS: 1.0000 | ORAL_TABLET | Freq: Four times a day (QID) | ORAL | Status: AC | PRN

## 2014-02-05 MED ORDER — SODIUM CHLORIDE 0.9 % IR SOLN
Status: DC | PRN
  Administered 2014-02-05: 3000 mL via INTRAVESICAL

## 2014-02-05 MED ORDER — PROPOFOL 10 MG/ML IV BOLUS
INTRAVENOUS | Status: AC
  Filled 2014-02-05: qty 20

## 2014-02-05 MED ORDER — LACTATED RINGERS IV SOLN: INTRAVENOUS | Status: DC

## 2014-02-05 MED ORDER — HYDROMORPHONE HCL 1 MG/ML IJ SOLN: 0.2500 mg | INTRAMUSCULAR | Status: DC | PRN

## 2014-02-05 MED ORDER — DEXTROSE 5 % IV SOLN
2.0000 g | INTRAVENOUS | Status: AC
  Administered 2014-02-05: 2 g via INTRAVENOUS

## 2014-02-05 MED ORDER — PROPOFOL 10 MG/ML IV BOLUS
INTRAVENOUS | Status: DC | PRN
  Administered 2014-02-05: 120 mg via INTRAVENOUS
  Administered 2014-02-05: 20 mg via INTRAVENOUS

## 2014-02-05 MED ORDER — LIDOCAINE HCL (CARDIAC) 20 MG/ML IV SOLN
INTRAVENOUS | Status: AC
  Filled 2014-02-05: qty 5

## 2014-02-05 MED ORDER — LIDOCAINE HCL (CARDIAC) 20 MG/ML IV SOLN
INTRAVENOUS | Status: DC | PRN
  Administered 2014-02-05: 50 mg via INTRAVENOUS

## 2014-02-05 MED ORDER — SODIUM CHLORIDE 0.9 % IV SOLN
INTRAVENOUS | Status: DC
  Administered 2014-02-05: 1000 mL via INTRAVENOUS

## 2014-02-05 MED ORDER — FENTANYL CITRATE 0.05 MG/ML IJ SOLN
INTRAMUSCULAR | Status: AC
  Filled 2014-02-05: qty 5

## 2014-02-05 SURGICAL SUPPLY — 27 items
BAG URINE DRAINAGE (UROLOGICAL SUPPLIES) ×4 IMPLANT
BASKET LASER NITINOL 1.9FR (BASKET) IMPLANT
BASKET STNLS GEMINI 4WIRE 3FR (BASKET) IMPLANT
BASKET ZERO TIP NITINOL 2.4FR (BASKET) IMPLANT
BSKT STON RTRVL 120 1.9FR (BASKET)
BSKT STON RTRVL GEM 120X11 3FR (BASKET)
BSKT STON RTRVL ZERO TP 2.4FR (BASKET)
CATH INTERMIT  6FR 70CM (CATHETERS) ×4 IMPLANT
CLOTH BEACON ORANGE TIMEOUT ST (SAFETY) ×4 IMPLANT
DRAPE CAMERA CLOSED 9X96 (DRAPES) ×4 IMPLANT
ELECT REM PT RETURN 9FT ADLT (ELECTROSURGICAL)
ELECTRODE REM PT RTRN 9FT ADLT (ELECTROSURGICAL) IMPLANT
FIBER LASER FLEXIVA 1000 (UROLOGICAL SUPPLIES) IMPLANT
FIBER LASER FLEXIVA 200 (UROLOGICAL SUPPLIES) IMPLANT
FIBER LASER FLEXIVA 365 (UROLOGICAL SUPPLIES) IMPLANT
FIBER LASER FLEXIVA 550 (UROLOGICAL SUPPLIES) IMPLANT
FIBER LASER TRAC TIP (UROLOGICAL SUPPLIES) IMPLANT
GLOVE BIOGEL M STRL SZ7.5 (GLOVE) ×4 IMPLANT
GOWN STRL REUS W/TWL LRG LVL3 (GOWN DISPOSABLE) ×4 IMPLANT
GUIDEWIRE ANG ZIPWIRE 038X150 (WIRE) ×4 IMPLANT
GUIDEWIRE STR DUAL SENSOR (WIRE) ×4 IMPLANT
IV NS IRRIG 3000ML ARTHROMATIC (IV SOLUTION) ×4 IMPLANT
PACK CYSTO (CUSTOM PROCEDURE TRAY) ×4 IMPLANT
SHIELD EYE BINOCULAR (MISCELLANEOUS) IMPLANT
SYRINGE 10CC LL (SYRINGE) IMPLANT
SYRINGE IRR TOOMEY STRL 70CC (SYRINGE) IMPLANT
TUBE FEEDING 8FR 16IN STR KANG (MISCELLANEOUS) ×4 IMPLANT

## 2014-02-05 NOTE — Transfer of Care (Signed)
Immediate Anesthesia Transfer of Care Note  Patient: [name] [name]  Procedure(s) Performed: Procedure(s): CYSTOSCOPY WITH RETROGRADE PYELOGRAM, REMOVAL OF ERODED CLIP (Bilateral) HOLMIUM LASER APPLICATION (N/A)  Patient Location: PACU  Anesthesia Type:General  Level of Consciousness: awake, alert  and oriented  Airway & Oxygen Therapy: Patient Spontanous Breathing and Patient connected to face mask oxygen  Post-op Assessment: Report given to PACU RN and Post -op Vital signs reviewed and stable  Post vital signs: Reviewed and stable  Complications: No apparent anesthesia complications

## 2014-02-05 NOTE — Anesthesia Preprocedure Evaluation (Addendum)
Anesthesia Evaluation  Patient identified by MRN, date of birth, ID band Patient awake    Reviewed: Allergy & Precautions, H&P , NPO status , Patient's Chart, lab work & pertinent test results, reviewed documented beta blocker date and time , Unable to perform ROS - Chart review only  Airway Mallampati: I  TM Distance: >3 FB Neck ROM: full    Dental  (+) Dental Advisory Given, Edentulous Upper, Edentulous Lower   Pulmonary shortness of breath and with exertion, COPDCurrent Smoker,  History acute respiratory failure breath sounds clear to auscultation  Pulmonary exam normal       Cardiovascular hypertension, Pt. on home beta blockers + CAD, + Past MI, + CABG and +CHF + dysrhythmias Atrial Fibrillation and Ventricular Tachycardia Rhythm:Regular Rate:Normal  Pulmonary htn.   Neuro/Psych negative neurological ROS  negative psych ROS   GI/Hepatic negative GI ROS, Neg liver ROS, GERD-  ,  Endo/Other  diabetes, Well Controlled, Type 2, Oral Hypoglycemic AgentsSecondary hyperparathyroidism  Renal/GU ESRF and DialysisRenal disease  negative genitourinary   Musculoskeletal   Abdominal   Peds  Hematology negative hematology ROS (+)   Anesthesia Other Findings   Reproductive/Obstetrics negative OB ROS                           Anesthesia Physical Anesthesia Plan  ASA: IV  Anesthesia Plan: General   Post-op Pain Management:    Induction: Intravenous  Airway Management Planned: LMA  Additional Equipment:   Intra-op Plan:   Post-operative Plan:   Informed Consent: I have reviewed the patients History and Physical, chart, labs and discussed the procedure including the risks, benefits and alternatives for the proposed anesthesia with the patient or authorized representative who has indicated his/her understanding and acceptance.   Dental Advisory Given  Plan Discussed with: CRNA and  Surgeon  Anesthesia Plan Comments:         Anesthesia Quick Evaluation

## 2014-02-05 NOTE — Discharge Instructions (Signed)
1 - You may have  bloody urine on / off x few days. This is normal.  2 - Call MD or go to ER for fever >102, severe pain / nausea / vomiting not relieved by medications, or acute change in medical status

## 2014-02-05 NOTE — Anesthesia Postprocedure Evaluation (Signed)
  Anesthesia Post-op Note  Patient: [name] [name]  Procedure(s) Performed: Procedure(s) (LRB): CYSTOSCOPY WITH RETROGRADE PYELOGRAM, REMOVAL OF ERODED CLIP (Bilateral) HOLMIUM LASER APPLICATION (N/A)  Patient Location: PACU  Anesthesia Type: General  Level of Consciousness: awake and alert   Airway and Oxygen Therapy: Patient Spontanous Breathing  Post-op Pain: mild  Post-op Assessment: Post-op Vital signs reviewed, Patient's Cardiovascular Status Stable, Respiratory Function Stable, Patent Airway and No signs of Nausea or vomiting  Last Vitals:  Filed Vitals:   02/05/14 1135  BP: 147/63  Pulse: 76  Temp: 36.5 C  Resp: 14    Post-op Vital Signs: stable   Complications: No apparent anesthesia complications

## 2014-02-05 NOTE — Brief Op Note (Signed)
02/05/2014  10:33 AM  PATIENT:  [name] [name]  69 y.o. male  PRE-OPERATIVE DIAGNOSIS:  HEMATURIA, ERODED CLIP INTO BLADDER  POST-OPERATIVE DIAGNOSIS:  HEMATURIA, ERODED CLIP INTO BLADDER  PROCEDURE:  Procedure(s): CYSTOSCOPY WITH RETROGRADE PYELOGRAM, REMOVAL OF ERODED CLIP (Bilateral) HOLMIUM LASER APPLICATION (N/A)  SURGEON:  Surgeon(s) and Role:    * Alexis Frock, MD - Primary  PHYSICIAN ASSISTANT:   ASSISTANTS: none   ANESTHESIA:   general  EBL:     BLOOD ADMINISTERED:none  DRAINS: none   LOCAL MEDICATIONS USED:  NONE  SPECIMEN:  Source of Specimen:  eroded hem-o-lok clip   DISPOSITION OF SPECIMEN:  PATHOLOGY  COUNTS:  YES  TOURNIQUET:  * No tourniquets in log *  DICTATION: .Other Dictation: Dictation Number [id number]  PLAN OF CARE: Discharge to home after PACU  PATIENT DISPOSITION:  PACU - hemodynamically stable.   Delay start of Pharmacological VTE agent (>24hrs) due to surgical blood loss or risk of bleeding: not applicable

## 2014-02-05 NOTE — H&P (Signed)
[name] [name] is an 69 y.o. male.    Chief Complaint: Pre-OP Cysto and Foreign Body Removal  HPI:   1 - Prostate Cancer - s/p robotic prostatectomy with bilateral lymphadenectomy for pT2cN0Mx Gleson 4+3=7 disesae, (?T3 anteriorly per path report) with POSITIVE left apical and anterior margin 05/18/2012. Pre-op PSA 7.1.   Post-op Surveillance History: 06/2012 - 0.02; 07/2012 - 0.05 --> Adjuvant radiotherapy 68.4Gy IMRT under care of Ledon Snare MD 02/2013 - 0.01; 09/2013 - <0.01  2 - Chronic Kidney Disease - Baseline Cr 3-4 range since 2011. Renal US 2009 w/o hydro and c/w medical renal disease. Follows with Dr. Clover Mealy at [facility]. Initiate hemodialysis 04/2013.   3 - Stress Urinary Incontinence - Presently managing with self-regimen Kegel's and down to 0 pads day/night by 19mos post-op and stable  4 - Erectile Dysfunction - Pt with penile fullness with stimulation post-prostatectomy, but not firm enough for penetration. No prior therapy. No unstable angina or nitrates.   5 - Gross Hematuria / Eroded Hemoclip in Bladder Neck - New gross hematuria x several. Only voids about 1 cup per day. No fevers. Cysto 12/2013 with eroded hemoclip into bladder neck area, about 70% within bladder neck lumen.   PMH sig for DM2, HTN, AV Fistula, hernia repair. No CV disease. No blood thinners.  Today Parag is seen to proceed with cysto and foreign body removal for his eroded surgical clip near bladder neck.  Past Medical History  Diagnosis Date  . Hypertension   . Joint pain   . Gout   . GERD (gastroesophageal reflux disease)   . Arthritis   . Bladder neck contracture   . Elevated prostate specific antigen (PSA)   . Urethral stricture unspecified   . Murmur, cardiac 04/21/2013  . CHF (congestive heart failure)   . Shortness of breath   . Dialysis patient   . Hyperlipidemia   . Diabetes mellitus   . Type 2 diabetes mellitus   . Coronary artery disease   . Inguinal hernia   . Chronic renal  insufficiency   . ESRD (end stage renal disease)     T, Th, Sat, [employer]  . Prostate cancer     adenocarcinoma gleason 7    Past Surgical History  Procedure Laterality Date  . Appendectomy  69 yrs old    open  . Av fistula placement, brachiocephalic  0865  . Robot assisted laparoscopic radical prostatectomy N/A 05/18/2012    Procedure: ROBOTIC ASSISTED LAPAROSCOPIC RADICAL PROSTATECTOMY;  Surgeon: Alexis Frock, MD;  Location[facility];  Service: Urology;  Laterality: N/A;  . Lymphadenectomy Bilateral 05/18/2012    Procedure: LYMPHADENECTOMY;  Surgeon: Alexis Frock, MD;  Location[facility];  Service: Urology;  Laterality: Bilateral;  . Circumcision, non-newborn    . Kidney surgery    . Pelvic laparoscopy    . Coronary artery bypass graft N/A 08/19/2013    Procedure: CORONARY ARTERY BYPASS GRAFTING (CABG) times 5 using left internal mammary artery to LAD, saphenous vein graft to diagonal; sequential saphenous vein graft to intermediate and distal circumflex; and saphenous vein graft to right coronary artery ;  Surgeon: [name], MD;  Location: [place];  Service: Open Heart Surgery;  Laterality: N/A;  . Intraoperative transesophageal echocardiogram N/A 08/19/2013    Procedure: INTRAOPERATIVE TRANSESOPHAGEAL ECHOCARDIOGRAM;  Surgeon: [name], MD;  Location: [place];  Service: Open Heart Surgery;  Laterality: N/A;  . Endovein harvest of greater saphenous vein Left 08/19/2013    Procedure:  ENDOVEIN HARVEST OF GREATER SAPHENOUS VEIN from left thigh and calf;  Surgeon: [name], MD;  Location: [place];  Service: Open Heart Surgery;  Laterality: Left;  . Left heart catheterization with coronary angiogram N/A 08/14/2013    Procedure: LEFT HEART CATHETERIZATION WITH CORONARY ANGIOGRAM;  Surgeon: Wellington Hampshire, MD;  Location: [place] [facility];  Service: Cardiovascular;  Laterality: N/A;  . Inguinal hernia repair Right 01/17/2014    Procedure: LAPAROSCOPIC RIGHT INGUINAL HERNIA REPAIR ;   Surgeon: Ralene Ok, MD;  Location: [place];  Service: General;  Laterality: Right;  . Insertion of mesh Right 01/17/2014    Procedure: INSERTION OF MESH;  Surgeon: Ralene Ok, MD;  Location: [place];  Service: General;  Laterality: Right;    Family History  Problem Relation Age of Onset  . Diabetes Brother   . Kidney disease Brother   . Hypertension Brother   . Stroke Mother   . Hypertension Sister   . Hypertension Brother   . Hypertension Brother   . Hypertension Sister    Social History:  reports that he has been smoking Cigarettes.  He has a 10 pack-year smoking history. He has never used smokeless tobacco. He reports that he does not drink alcohol or use illicit drugs.  Allergies:  Allergies  Allergen Reactions  . Ace Inhibitors     unknown    No prescriptions prior to admission    No results found for this or any previous visit (from the past 48 hour(s)). No results found.  Review of Systems  Constitutional: Negative for fever and chills.  HENT: Negative.   Eyes: Negative.   Respiratory: Negative.   Cardiovascular: Negative.   Gastrointestinal: Negative.   Genitourinary: Positive for hematuria. Negative for flank pain.  Musculoskeletal: Negative.   Skin: Negative.   Neurological: Negative.   Endo/Heme/Allergies: Negative.   Psychiatric/Behavioral: Negative.     There were no vitals taken for this visit. Physical Exam  Constitutional: He appears well-developed.  HENT:  Head: Normocephalic.  Eyes: Pupils are equal, round, and reactive to light.  Neck: Normal range of motion.  Cardiovascular: Normal rate.   Respiratory: Effort normal.  GI: Soft.  Prior incision sites w/o hernias  Genitourinary: Penis normal.  Musculoskeletal: Normal range of motion.  AVF with thrill  Neurological: He is alert.  Skin: Skin is warm.  Psychiatric: He has a normal mood and affect. His behavior is normal. Judgment and thought content normal.     Assessment/Plan  1  - Prostate Cancer - PSA's now undetectable. This is great news.   2 - Chronic Kidney Disease - Now on dialysis.   3 - Stress Urinary Incontinence  - Continue self-Kegel's as he is having fantastic result.  4 - Erectile Dysfunction - has not tired PDE5i yet, has if needed.   5 - Gross Hematuria / Eroded Hemoclip in Bladder Neck - Eval wtih cysto with likely hemoclip as etiology. Rec proceed with endoscopic removal with retrogrades to remove and help complete hematuria eval. Picture drawn and given to pt.   We rediscussed operative biopsy / transurethral resection as the best next step for diagnostic and therapeutic purposes with goals being to remove the eroded clip We rediscussed risks including bleeding, infection, damage to kidney / ureter / bladder including bladder perforation which can typically managed with prolonged foley catheterization. We rementioned anesthetic and other rare risks including DVT, PE, MI, and mortality. I also rementioned that adjunctive procedures such as ureteral stenting, retrograde pyelography, and  ureteroscopy may be necessary to fully evaluate the urinary tract depending on intra-operative findings.   After answering all questions to the patient's satisfaction, they wish to proceed today as planned.  Zanovia Rotz 02/05/2014, 5:50 AM

## 2014-02-06 ENCOUNTER — Encounter (HOSPITAL_COMMUNITY): Payer: Self-pay | Admitting: Urology

## 2014-02-06 NOTE — Op Note (Signed)
NAME:  [name] [name], [name] [name] NO.:  [id number]  MEDICAL RECORD NO.:  [id number]  LOCATION:  [facility]                         FACILITY:  [facility]  PHYSICIAN:  Alexis Frock, MD     DATE OF BIRTH:  1945-04-01  DATE OF PROCEDURE: 02/05/2014                              OPERATIVE REPORT   DIAGNOSES:  History of prostate cancer, eroded bladder neck surgical clip, hematuria.  PROCEDURES: 1. Cystoscopy with removal of foreign body. 2. Bladder fulguration.  ESTIMATED BLOOD LOSS:  Nil.  COMPLICATIONS:  None.  SPECIMENS:  Eroded surgical clip for pathology gross identification only.  FINDINGS: 1. Eroded partially calcified Hem-o-lok clip at the right inferior     bladder neck. 2. Otherwise, unremarkable urinary bladder and urethra. 3. Excellent mucosal coaptation of membranous urethra.  INDICATION:  [name] [name] is a pleasant 69 year old gentleman with history of end-stage renal disease.  He is dialysis dependent through a left arm AV fistula.  He has history of prostate cancer and is status post prostatectomy over a year ago.  He began having gross blood per penis and office evaluation with cystoscopy revealed an eroded surgical clip in his bladder neck.  There was some erythema around this and is most likely source of the hematuria.  Options were discussed with management including cystoscopic removal and he wished to proceed.  Informed consent was obtained and placed in medical record.  PROCEDURE IN DETAIL:  The patient being [name] [name] verified. Procedure being cysto with foreign body removal was confirmed. Procedure was carried out.  Time-out was performed.  Intravenous antibiotics were administered.  General LMA anesthesia was introduced. The patient was placed into a low-lithotomy position and sterile field was created by prepping and draping the patient's penis, perineum, proximal thighs using iodine x3.  Next, cystourethroscopy was performed using a 21-French rigid  cystoscope 30-degree offset lens.  Inspection of the anterior urethra was unremarkable.  Inspection of posterior urethra revealed excellent mucosal coaptation.  Inspection of the bladder revealed of course absence of the prostate.  There was a widely patent bladder neck; however, at the inferior right border, there was an eroded surgical clip that was partially calcified.  No other mucosal abnormalities were seen in the bladder.  The surgical clip was placed on its long axis and carefully removed using a cystoscopic grasp, this was satisfied for pathology for gross identification only.  The area where the clip had contacted the mucosa was very lightly fulgurated using Bugbee electrode.  This resulted in excellent hemostasis.  No other foreign bodies were identified and we had achieved the goals of surgery today.  It was felt that catheter position was not warranted.  Bladder was emptied per cystoscope.  Procedure was then terminated.  The patient tolerated the procedure well.  There were no immediate periprocedural complications.  The patient was taken to [facility] in stable condition.  We will not keep the patient for trial of void as he makes minimal urine at baseline.          ______________________________ Alexis Frock, MD     TM/MEDQ  D:  02/05/2014  T:  02/06/2014  Job:  [id number]

## 2014-03-04 ENCOUNTER — Ambulatory Visit: Payer: Medicare Other | Admitting: Cardiovascular Disease

## 2014-03-05 ENCOUNTER — Encounter: Payer: Self-pay | Admitting: Cardiovascular Disease

## 2014-03-05 ENCOUNTER — Ambulatory Visit (INDEPENDENT_AMBULATORY_CARE_PROVIDER_SITE_OTHER): Payer: Medicare Other | Admitting: Cardiovascular Disease

## 2014-03-05 VITALS — BP 110/64 | HR 75 | Ht 67.0 in | Wt 168.9 lb

## 2014-03-05 DIAGNOSIS — N186 End stage renal disease: Secondary | ICD-10-CM

## 2014-03-05 DIAGNOSIS — E785 Hyperlipidemia, unspecified: Secondary | ICD-10-CM

## 2014-03-05 DIAGNOSIS — Z951 Presence of aortocoronary bypass graft: Secondary | ICD-10-CM

## 2014-03-05 DIAGNOSIS — Z992 Dependence on renal dialysis: Secondary | ICD-10-CM

## 2014-03-05 DIAGNOSIS — I4891 Unspecified atrial fibrillation: Secondary | ICD-10-CM

## 2014-03-05 DIAGNOSIS — E119 Type 2 diabetes mellitus without complications: Secondary | ICD-10-CM

## 2014-03-05 NOTE — Patient Instructions (Addendum)
Your physician has recommended you make the following change in your medication: the metoprolol succ has been changed to 1 tablet daily on NON dialysis days and 1/2 tablet on dialysis. STOP THE AMIODARONE.  Your physician recommends that you return for lab work and return office visit in 6 months.  Lab slips will be sent to you to have the blood drawn at that  time.

## 2014-03-05 NOTE — Progress Notes (Signed)
Patient ID: [name] [name], male   DOB: Apr 10, 1945, 69 y.o.   MRN: [id number]     HPI: [name] [name] is a 69 y.o. African American male who presents to the office today for a 4 month follow up cardiology evaluation.  Mr. Manpreet Strey presented to [facility] on 08/13/2013 and was admitted with recurrent chest pain and ruled in for non-ST segment elevation myocardial infarction with positive troponins.  He had a history of hypertension, diabetes mellitus, and hyperlipidemia.   He has end-stage renal disease on dialysis.  He was found to have significant coronary obstructive disease at catheterization  and underwent CABG revascularization surgery 5 on 08/19/2013 by Dr. Pia Mau with a LIMA graft to the LAD, SVG to diagonal, sequential vein graft to the intermediate and distal circumflex, and vein graft to the RCA. He developed postoperative atrial fibrillation but ultimately converted to sinus rhythm with IV amiodarone.  He also developed a pneumothorax and required transient chest tube placement.    Since his bypass surgery, he denies recurrent anginal symptoms.  He is on dialysis at [facility] on Tuesday, Thursdays, and Saturdays and is followed by Dr. Jimmy Footman.  He denies awareness of recurrent atrial fibrillation.  He denies PND, orthopnea.  When I last saw him, I reduced his amiodarone dose from 200 mg daily to 100 mg daily.  He has tolerated this well.  He has been taking Toprol-XL 12.5 mg daily.  He states at times at the end of dialysis.  His blood pressure may get low.  He denies recurrent chest pain symptoms.  He is unaware of palpitations.  He is still smoking and currently smokes approximately one half pack of cigarettes per day.  He has a history of GERD for which he takes Protonix.  He has been on atorvastatin 29 g daily for hyperlipidemia.    Past Medical History  Diagnosis Date  . Hypertension   . Joint pain   . Gout   . GERD (gastroesophageal reflux disease)   . Arthritis   .  Bladder neck contracture   . Elevated prostate specific antigen (PSA)   . Urethral stricture unspecified   . Murmur, cardiac 04/21/2013  . CHF (congestive heart failure)   . Shortness of breath   . Dialysis patient   . Hyperlipidemia   . Diabetes mellitus   . Type 2 diabetes mellitus   . Coronary artery disease   . Inguinal hernia   . Chronic renal insufficiency   . ESRD (end stage renal disease)     T, Th, Sat, [employer]  . Prostate cancer     adenocarcinoma gleason 7    Past Surgical History  Procedure Laterality Date  . Appendectomy  69 yrs old    open  . Av fistula placement, brachiocephalic  0932  . Robot assisted laparoscopic radical prostatectomy N/A 05/18/2012    Procedure: ROBOTIC ASSISTED LAPAROSCOPIC RADICAL PROSTATECTOMY;  Surgeon: Alexis Frock, MD;  Location: [facility];  Service: Urology;  Laterality: N/A;  . Lymphadenectomy Bilateral 05/18/2012    Procedure: LYMPHADENECTOMY;  Surgeon: Alexis Frock, MD;  Location: [facility];  Service: Urology;  Laterality: Bilateral;  . Circumcision, non-newborn    . Kidney surgery    . Pelvic laparoscopy    . Coronary artery bypass graft N/A 08/19/2013    Procedure: CORONARY ARTERY BYPASS GRAFTING (CABG) times 5 using left internal mammary artery to LAD, saphenous vein graft to diagonal; sequential saphenous vein graft to intermediate and distal circumflex; and saphenous  vein graft to right coronary artery ;  Surgeon: [name], MD;  Location: [place];  Service: Open Heart Surgery;  Laterality: N/A;  . Intraoperative transesophageal echocardiogram N/A 08/19/2013    Procedure: INTRAOPERATIVE TRANSESOPHAGEAL ECHOCARDIOGRAM;  Surgeon: [name], MD;  Location: [place];  Service: Open Heart Surgery;  Laterality: N/A;  . Endovein harvest of greater saphenous vein Left 08/19/2013    Procedure: ENDOVEIN HARVEST OF GREATER SAPHENOUS VEIN from left thigh and calf;  Surgeon: [name], MD;  Location: [place];  Service: Open Heart  Surgery;  Laterality: Left;  . Left heart catheterization with coronary angiogram N/A 08/14/2013    Procedure: LEFT HEART CATHETERIZATION WITH CORONARY ANGIOGRAM;  Surgeon: Wellington Hampshire, MD;  Location: [place] CATH LAB;  Service: Cardiovascular;  Laterality: N/A;  . Inguinal hernia repair Right 01/17/2014    Procedure: LAPAROSCOPIC RIGHT INGUINAL HERNIA REPAIR ;  Surgeon: Ralene Ok, MD;  Location: [place];  Service: General;  Laterality: Right;  . Insertion of mesh Right 01/17/2014    Procedure: INSERTION OF MESH;  Surgeon: Ralene Ok, MD;  Location: [place];  Service: General;  Laterality: Right;  . Cystoscopy w/ retrogrades Bilateral 02/05/2014    Procedure: CYSTOSCOPY WITH RETROGRADE PYELOGRAM, REMOVAL OF ERODED CLIP;  Surgeon: Alexis Frock, MD;  [facility];  Service: Urology;  Laterality: Bilateral;  . Holmium laser application N/A 05/04/9561    Procedure: HOLMIUM LASER APPLICATION;  Surgeon: Alexis Frock, MD;  [facility];  Service: Urology;  Laterality: N/A;    Allergies  Allergen Reactions  . Ace Inhibitors     unknown    Current Outpatient Prescriptions  Medication Sig Dispense Refill  . amiodarone (PACERONE) 100 MG tablet Take 1 tablet (100 mg total) by mouth daily. 90 tablet 1  . amLODipine (NORVASC) 10 MG tablet Take 1 tablet by mouth daily.  2  . aspirin EC 81 MG tablet Take 81 mg by mouth daily.    Marland Kitchen atorvastatin (LIPITOR) 20 MG tablet Take 20 mg by mouth every morning.    . cinacalcet (SENSIPAR) 30 MG tablet Take 30 mg by mouth daily.    . darbepoetin (ARANESP) 25 MCG/0.42ML SOLN injection Inject 0.42 mLs (25 mcg total) into the vein every Thursday with hemodialysis. 14.28 mL   . ferric gluconate 125 mg in sodium chloride 0.9 % 100 mL Inject 125 mg into the vein Every Tuesday,Thursday,and Saturday with dialysis.    Marland Kitchen glipiZIDE (GLUCOTROL XL) 2.5 MG 24 hr tablet Take 1 tablet by mouth daily before supper.    . metoprolol succinate (TOPROL-XL) 25 MG 24 hr tablet  Take 0.5 tablets by mouth daily.  12  . multivitamin (RENA-VIT) TABS tablet Take 1 tablet by mouth daily.  6  . naphazoline-pheniramine (NAPHCON-A) 0.025-0.3 % ophthalmic solution Place 1 drop into both eyes 4 (four) times daily as needed for irritation. 15 mL 0  . oxyCODONE-acetaminophen (ROXICET) 5-325 MG per tablet Take 1-2 tablets by mouth every 6 (six) hours as needed for moderate pain or severe pain. Post-operatively 20 tablet 0  . pantoprazole (PROTONIX) 40 MG tablet Take 40 mg by mouth 2 (two) times daily.    Marland Kitchen senna-docusate (SENOKOT-S) 8.6-50 MG per tablet Take 1 tablet by mouth 2 (two) times daily. While taking pain meds to prevent constipation 20 tablet 0  . sevelamer carbonate (RENVELA) 800 MG tablet Take 1,600-2,400 mg by mouth 3 (three) times daily with meals.     . tamsulosin (FLOMAX) 0.4 MG CAPS  capsule Take 1 capsule by mouth daily.  3  . VOLTAREN 1 % GEL Take 1 application by mouth daily as needed (for pain).   1   No current facility-administered medications for this visit.    History   Social History  . Marital Status: Legally Separated    Spouse Name: N/A  . Number of Children: N/A  . Years of Education: N/A   Occupational History  . Not on file.   Social History Main Topics  . Smoking status: Current Every Day Smoker -- 0.25 packs/day for 40 years    Types: Cigarettes  . Smokeless tobacco: Never Used     Comment: reports also using e- cigarette but trying to quit  . Alcohol Use: No  . Drug Use: No  . Sexual Activity: Not on file   Other Topics Concern  . Not on file   Social History Narrative    Family History  Problem Relation Age of Onset  . Diabetes Brother   . Kidney disease Brother   . Hypertension Brother   . Stroke Mother   . Hypertension Sister   . Hypertension Brother   . Hypertension Brother   . Hypertension Sister     ROS General: Negative; No fevers, chills, or night sweats HEENT: Negative; No changes in vision or hearing, sinus  congestion, difficulty swallowing Pulmonary: Negative; No cough, wheezing, shortness of breath, hemoptysis Cardiovascular: See HPI: No chest pain, presyncope, syncope, palpatations GI: positive for GERD No nausea, vomiting, diarrhea, or abdominal pain GU: Negative; No dysuria, hematuria, or difficulty voiding Renal: End-stage renal disease on dialysis Musculoskeletal: Negative; no myalgias, joint pain, or weakness Hematologic: Negative; no easy bruising, bleeding Endocrine: Negative; no heat/cold intolerance; no diabetes, Neuro: Negative; no changes in balance, headaches Skin: Negative; No rashes or skin lesions Psychiatric: Negative; No behavioral problems, depression Sleep: Negative; No snoring,  daytime sleepiness, hypersomnolence, bruxism, restless legs, hypnogognic hallucinations. Other comprehensive 14 point system review is negative   Physical Exam BP 110/64 mmHg  Pulse 75  Ht _0  (1.702 m)  Wt 168 lb 14.4 oz (76.613 kg)  BMI 26.45 kg/m2 General: Alert, oriented, no distress.  Skin: normal turgor, no rashes, warm and dry HEENT: Normocephalic, atraumatic. Pupils equal round and reactive to light; sclera anicteric; extraocular muscles intact, No lid lag; Nose without nasal septal hypertrophy; Mouth/Parynx benign; Mallinpatti scale 3 Neck: No JVD, no carotid bruits; normal carotid upstroke Lungs: Occasional rhonchi, improved following cough; no wheezing or rales, normal inspiratory and expiratory effort Chest wall: without tenderness to palpitation Heart: PMI not displaced, RRR, s1 s2 normal, 2/6 systolic murmur, No diastolic murmur, no rubs, gallops, thrills, or heaves Abdomen: soft, nontender; no hepatosplenomehaly, BS+; abdominal aorta nontender and not dilated by palpation. Back: no CVA tenderness Pulses: 2+; left arm fistula.  Bilateral femoral bruits.  Musculoskeletal: full range of motion, normal strength, no joint deformities Extremities: left arm fistula, no clubbing  cyanosis or edema, Homan's sign negative  Neurologic: grossly nonfocal; Cranial nerves grossly wnl Psychologic: Normal mood and affect  ECG (independently read by me): Normal sinus rhythm at 75 bpm.  T-wave abnormalities anterolaterally.  Normal intervals.  11/04/2013 ECG (independently read by me): normal sinus rhythm at 80 bpm.  PVC.  Diffuse T-wave changes in V4 through V6 in lead 1 and aVL, which are old  LABS:  BMET  BMP Latest Ref Rng 02/05/2014 01/17/2014 01/08/2014  Glucose 70 - 99 mg/dL 88 107(H) 108(H)  BUN 6 - 23 mg/dL 36(H) -  31(H)  Creatinine 0.50 - 1.35 mg/dL 8.33(H) - 8.71(H)  Sodium 135 - 145 mmol/L 140 140 139  Potassium 3.5 - 5.1 mmol/L 3.8 4.0 4.4  Chloride 96 - 112 mmol/L 98 - 97  CO2 19 - 32 mmol/L 30 - 32  Calcium 8.4 - 10.5 mg/dL 9.4 - 9.8     Hepatic Function Panel   Hepatic Function Latest Ref Rng 09/05/2013 09/03/2013 08/31/2013  Total Protein 6.0 - 8.3 g/dL - - -  Albumin 3.5 - 5.2 g/dL 2.7(L) 2.5(L) 2.8(L)  AST 0 - 37 U/L - - -  ALT 0 - 53 U/L - - -  Alk Phosphatase 39 - 117 U/L - - -  Total Bilirubin 0.3 - 1.2 mg/dL - - -  Bilirubin, Direct 0.0-0.3 mg/dL - - -     CBC  CBC Latest Ref Rng 02/05/2014 01/17/2014 01/08/2014  WBC 4.0 - 10.5 K/uL 8.7 - 8.9  Hemoglobin 13.0 - 17.0 g/dL 11.8(L) 12.6(L) 11.4(L)  Hematocrit 39.0 - 52.0 % 37.6(L) 37.0(L) 35.7(L)  Platelets 150 - 400 K/uL 271 - 228     BNP    Component Value Date/Time   PROBNP 9802.0* 04/23/2013 0322    Lipid Panel     Component Value Date/Time   CHOL 87 04/22/2013 0735   TRIG 76 04/22/2013 0735   HDL 31* 04/22/2013 0735   CHOLHDL 2.8 04/22/2013 0735   VLDL 15 04/22/2013 0735   LDLCALC 41 04/22/2013 0735     RADIOLOGY: No results found.    ASSESSMENT AND PLAN: Mr. Wyatte Dames is a 69 year old African-American male with end-stage renal disease on hemodialysis who suffered a non-ST segment elevation myocardial infarctions on two occasions last year and ultimately led to  his CABG revascularization.  He is status post CABG surgery 5.  He denies recurrent anginal symptomatology.  He is maintaining sinus rhythm on amiodarone day 100 mg.  he is unaware of any recurrent arrhythmia.  I now recommended he discontinue his amiodarone.  I will further titrate his Toprol-XL to 25 mg on nondialysis days and he will continue with 12.5 mg on dialysis days, to avoid potential hypotension.  If blood pressure continues to be low it may be necessary to reduce his amlodipine to 5 mg.  I discussed smoking cessation.  He is on atorvastatin for hyperlipidemia.  He is diabetic on glipizide.  In 6 months he will undergo repeat laboratory and I will see him in the office for follow-up evaluation.    Troy Sine, MD, [facility]  03/05/2014 10:17 AM

## 2014-03-31 ENCOUNTER — Encounter (HOSPITAL_COMMUNITY): Payer: Self-pay | Admitting: Family Medicine

## 2014-03-31 ENCOUNTER — Emergency Department (HOSPITAL_COMMUNITY)
Admission: EM | Admit: 2014-03-31 | Discharge: 2014-03-31 | Disposition: A | Discharge status: Home / Self Care | Payer: Medicare Other | Attending: Emergency Medicine | Admitting: Emergency Medicine

## 2014-03-31 DIAGNOSIS — K219 Gastro-esophageal reflux disease without esophagitis: Secondary | ICD-10-CM | POA: Insufficient documentation

## 2014-03-31 DIAGNOSIS — Z79899 Other long term (current) drug therapy: Secondary | ICD-10-CM | POA: Diagnosis not present

## 2014-03-31 DIAGNOSIS — S61211A Laceration without foreign body of left index finger without damage to nail, initial encounter: Secondary | ICD-10-CM | POA: Diagnosis not present

## 2014-03-31 DIAGNOSIS — Z8546 Personal history of malignant neoplasm of prostate: Secondary | ICD-10-CM | POA: Insufficient documentation

## 2014-03-31 DIAGNOSIS — Z7982 Long term (current) use of aspirin: Secondary | ICD-10-CM | POA: Diagnosis not present

## 2014-03-31 DIAGNOSIS — Y289XXA Contact with unspecified sharp object, undetermined intent, initial encounter: Secondary | ICD-10-CM | POA: Insufficient documentation

## 2014-03-31 DIAGNOSIS — I2510 Atherosclerotic heart disease of native coronary artery without angina pectoris: Secondary | ICD-10-CM | POA: Insufficient documentation

## 2014-03-31 DIAGNOSIS — E119 Type 2 diabetes mellitus without complications: Secondary | ICD-10-CM | POA: Diagnosis not present

## 2014-03-31 DIAGNOSIS — Z720 Tobacco use: Secondary | ICD-10-CM | POA: Diagnosis not present

## 2014-03-31 DIAGNOSIS — Z992 Dependence on renal dialysis: Secondary | ICD-10-CM | POA: Diagnosis not present

## 2014-03-31 DIAGNOSIS — I120 Hypertensive chronic kidney disease with stage 5 chronic kidney disease or end stage renal disease: Secondary | ICD-10-CM | POA: Insufficient documentation

## 2014-03-31 DIAGNOSIS — E785 Hyperlipidemia, unspecified: Secondary | ICD-10-CM | POA: Insufficient documentation

## 2014-03-31 DIAGNOSIS — N186 End stage renal disease: Secondary | ICD-10-CM | POA: Insufficient documentation

## 2014-03-31 DIAGNOSIS — M1990 Unspecified osteoarthritis, unspecified site: Secondary | ICD-10-CM | POA: Insufficient documentation

## 2014-03-31 DIAGNOSIS — Z951 Presence of aortocoronary bypass graft: Secondary | ICD-10-CM | POA: Insufficient documentation

## 2014-03-31 DIAGNOSIS — Y998 Other external cause status: Secondary | ICD-10-CM | POA: Diagnosis not present

## 2014-03-31 DIAGNOSIS — I509 Heart failure, unspecified: Secondary | ICD-10-CM | POA: Diagnosis not present

## 2014-03-31 DIAGNOSIS — Y9389 Activity, other specified: Secondary | ICD-10-CM | POA: Diagnosis not present

## 2014-03-31 DIAGNOSIS — Y929 Unspecified place or not applicable: Secondary | ICD-10-CM | POA: Diagnosis not present

## 2014-03-31 DIAGNOSIS — R011 Cardiac murmur, unspecified: Secondary | ICD-10-CM | POA: Diagnosis not present

## 2014-03-31 DIAGNOSIS — S6992XA Unspecified injury of left wrist, hand and finger(s), initial encounter: Secondary | ICD-10-CM | POA: Diagnosis present

## 2014-03-31 NOTE — ED Provider Notes (Signed)
CSN: [id number]     Arrival date & time 03/31/14  1642 History  This chart was scribed for non-physician practitioner, Noland Fordyce, PA-C, working with Malvin Johns, MD by Ladene Artist, ED Scribe. This patient was seen in room TR07C/TR07C and the patient's care was started at 5:32 PM.   Chief Complaint  Patient presents with  . Finger Injury   The history is provided by the patient. No language interpreter was used.   HPI Comments: [name] [name] is a 69 y.o. male, with a h/o HTN, arthritis, CHF, CAD, hyperlipidemia, DM, end stage renal disease, who presents to the Emergency Department complaining of L index finger laceration sustained around 1 PM yesterday. Pt states that he was cutting roast when he cut the top of his finger. He currently rates his pain 3/10, worse with movement. Pt denies numbness/tingling, blood thinner use. Bleeding is controlled with bandage but pt states when he checked the bandage today his finger was still bleeding. Pt thinks that his tetanus is UTD as he goes to dialysis and plans on checking with his PCP.    Past Medical History  Diagnosis Date  . Hypertension   . Joint pain   . Gout   . GERD (gastroesophageal reflux disease)   . Arthritis   . Bladder neck contracture   . Elevated prostate specific antigen (PSA)   . Urethral stricture unspecified   . Murmur, cardiac 04/21/2013  . CHF (congestive heart failure)   . Shortness of breath   . Dialysis patient   . Hyperlipidemia   . Diabetes mellitus   . Type 2 diabetes mellitus   . Coronary artery disease   . Inguinal hernia   . Chronic renal insufficiency   . ESRD (end stage renal disease)     T, Th, Sat, [employer]  . Prostate cancer     adenocarcinoma gleason 7   Past Surgical History  Procedure Laterality Date  . Appendectomy  69 yrs old    open  . Av fistula placement, brachiocephalic  5784  . Robot assisted laparoscopic radical prostatectomy N/A 05/18/2012    Procedure: ROBOTIC ASSISTED  LAPAROSCOPIC RADICAL PROSTATECTOMY;  Surgeon: Alexis Frock, MD;  [facility];  Service: Urology;  Laterality: N/A;  . Lymphadenectomy Bilateral 05/18/2012    Procedure: LYMPHADENECTOMY;  Surgeon: Alexis Frock, MD;  [facility];  Service: Urology;  Laterality: Bilateral;  . Circumcision, non-newborn    . Kidney surgery    . Pelvic laparoscopy    . Coronary artery bypass graft N/A 08/19/2013    Procedure: CORONARY ARTERY BYPASS GRAFTING (CABG) times 5 using left internal mammary artery to LAD, saphenous vein graft to diagonal; sequential saphenous vein graft to intermediate and distal circumflex; and saphenous vein graft to right coronary artery ;  Surgeon: [name], MD;  Location: [place];  Service: Open Heart Surgery;  Laterality: N/A;  . Intraoperative transesophageal echocardiogram N/A 08/19/2013    Procedure: INTRAOPERATIVE TRANSESOPHAGEAL ECHOCARDIOGRAM;  Surgeon: [name], MD;  Location: [place];  Service: Open Heart Surgery;  Laterality: N/A;  . Endovein harvest of greater saphenous vein Left 08/19/2013    Procedure: ENDOVEIN HARVEST OF GREATER SAPHENOUS VEIN from left thigh and calf;  Surgeon: [name], MD;  Location: [place];  Service: Open Heart Surgery;  Laterality: Left;  . Left heart catheterization with coronary angiogram N/A 08/14/2013    Procedure: LEFT HEART CATHETERIZATION WITH CORONARY ANGIOGRAM;  Surgeon: Wellington Hampshire, MD;  Location: [facility]  [facility];  Service: Cardiovascular;  Laterality: N/A;  . Inguinal hernia repair Right 01/17/2014    Procedure: LAPAROSCOPIC RIGHT INGUINAL HERNIA REPAIR ;  Surgeon: Ralene Ok, MD;  Location: [place];  Service: General;  Laterality: Right;  . Insertion of mesh Right 01/17/2014    Procedure: INSERTION OF MESH;  Surgeon: Ralene Ok, MD;  Location: [place];  Service: General;  Laterality: Right;  . Cystoscopy w/ retrogrades Bilateral 02/05/2014    Procedure: CYSTOSCOPY WITH RETROGRADE PYELOGRAM, REMOVAL OF ERODED  CLIP;  Surgeon: Alexis Frock, MD;  [facility];  Service: Urology;  Laterality: Bilateral;  . Holmium laser application N/A 2/44/6950    Procedure: HOLMIUM LASER APPLICATION;  Surgeon: Alexis Frock, MD;  [facility];  Service: Urology;  Laterality: N/A;   Family History  Problem Relation Age of Onset  . Diabetes Brother   . Kidney disease Brother   . Hypertension Brother   . Stroke Mother   . Hypertension Sister   . Hypertension Brother   . Hypertension Brother   . Hypertension Sister    History  Substance Use Topics  . Smoking status: Current Every Day Smoker -- 0.25 packs/day for 40 years    Types: Cigarettes  . Smokeless tobacco: Never Used     Comment: reports also using e- cigarette but trying to quit  . Alcohol Use: No    Review of Systems  Skin: Positive for wound.  Neurological: Negative for numbness.  Hematological: Does not bruise/bleed easily.   Allergies  Ace inhibitors  Home Medications   Prior to Admission medications   Medication Sig Start Date End Date Taking? Authorizing Provider  amLODipine (NORVASC) 2.5 MG tablet Take 2.5 mg by mouth daily.    Historical Provider, MD  aspirin EC 81 MG tablet Take 81 mg by mouth daily.    Historical Provider, MD  atorvastatin (LIPITOR) 20 MG tablet Take 20 mg by mouth every morning.    Historical Provider, MD  cinacalcet (SENSIPAR) 30 MG tablet Take 30 mg by mouth daily.    Historical Provider, MD  darbepoetin (ARANESP) 25 MCG/0.42ML SOLN injection Inject 0.42 mLs (25 mcg total) into the vein every Thursday with hemodialysis. 09/06/13   John Giovanni, PA-C  ferric gluconate 125 mg in sodium chloride 0.9 % 100 mL Inject 125 mg into the vein Every Tuesday,Thursday,and Saturday with dialysis. 09/06/13   [name], PA-C  glipiZIDE (GLUCOTROL XL) 2.5 MG 24 hr tablet Take 1 tablet by mouth daily before supper. 10/30/13   Historical Provider, MD  metoprolol succinate (TOPROL-XL) 25 MG 24 hr tablet Take 1 tablet on  non dialysis days. 1/2 tablet on dialysis days 02/13/14   Historical Provider, MD  multivitamin (RENA-VIT) TABS tablet Take 1 tablet by mouth daily. 12/10/13   Historical Provider, MD  naphazoline-pheniramine (NAPHCON-A) 0.025-0.3 % ophthalmic solution Place 1 drop into both eyes 4 (four) times daily as needed for irritation. 09/06/13   [name], PA-C  oxyCODONE-acetaminophen (ROXICET) 5-325 MG per tablet Take 1-2 tablets by mouth every 6 (six) hours as needed for moderate pain or severe pain. Post-operatively 02/05/14   Alexis Frock, MD  pantoprazole (PROTONIX) 40 MG tablet Take 40 mg by mouth 2 (two) times daily.    Historical Provider, MD  senna-docusate (SENOKOT-S) 8.6-50 MG per tablet Take 1 tablet by mouth 2 (two) times daily. While taking pain meds to prevent constipation 02/05/14   Alexis Frock, MD  sevelamer carbonate (RENVELA) 800 MG tablet Take 1,600-2,400 mg by  mouth 3 (three) times daily with meals.     Historical Provider, MD  tamsulosin (FLOMAX) 0.4 MG CAPS capsule Take 1 capsule by mouth daily. 01/09/14   Historical Provider, MD  VOLTAREN 1 % GEL Take 1 application by mouth daily as needed (for pain).  12/24/13   Historical Provider, MD   BP 132/66 mmHg  Pulse 86  Temp(Src) 98.3 F (36.8 C)  Resp 18  SpO2 98% Physical Exam  Constitutional: He is oriented to person, place, and time. He appears well-developed and well-nourished.  HENT:  Head: Normocephalic and atraumatic.  Eyes: EOM are normal.  Neck: Normal range of motion.  Cardiovascular: Normal rate.   Pulmonary/Chest: Effort normal.  Musculoskeletal: Normal range of motion.  Full ROM of L index finger.   Neurological: He is alert and oriented to person, place, and time.  Skin: Skin is warm and dry. Laceration noted. No ecchymosis noted. No erythema.  Dorsal aspect distal part of L index finger: 0.5 cm u-shaped laceration/skin flap. Small amount of oozing red blood. No foreign bodies. Bleeding controlled with light  pressure. No erythema or ecchymosis of surrounding skin. No nailbed involvement.  Psychiatric: He has a normal mood and affect. His behavior is normal.  Nursing note and vitals reviewed.  ED Course  Procedures   LACERATION REPAIR Performed by: Noland Fordyce A. Authorized by: Gwenyth Bender Consent: Verbal consent obtained. Risks and benefits: risks, benefits and alternatives were discussed Consent given by: patient Patient identity confirmed: provided demographic data Prepped and Draped in normal sterile fashion Wound explored  Laceration Location: distal aspect Left index finger  Laceration Length: 0.5cm  No Foreign Bodies seen or palpated  Anesthesia: none  Irrigation method: syringe Amount of cleaning: standard  Skin closure: simple, steri-strips  Number: 2  Technique: steri-strips and pressure bandage   Patient tolerance: Patient tolerated the procedure well with no immediate complications.   DIAGNOSTIC STUDIES: Oxygen Saturation is 98% on RA, normal by my interpretation.    COORDINATION OF CARE: 5:09 PM-Discussed treatment plan which includes steri strip with pt at bedside and pt agreed to plan.   Labs Review Labs Reviewed - No data to display  Imaging Review No results found.   EKG Interpretation None      MDM   Final diagnoses:  Laceration of left index finger w/o foreign body w/o damage to nail, initial encounter    Simple laceration to dorsal aspect left index finger, repaired with 2 steri strips and pressure bandage w/o immediate complications.  Home care instructions provided.  Advised to f/u with his PCP for wound recheck in 4-5 days, sooner if signs of infection.  Return precautions provided. Pt verbalized understanding and agreement with tx plan.   I personally performed the services described in this documentation, which was scribed in my presence. The recorded information has been reviewed and is accurate.    Noland Fordyce,  PA-C 03/31/14 [place], MD 04/01/14 0005

## 2014-03-31 NOTE — ED Notes (Signed)
Pt sts was cutting roast yesterday and cut left index finger. sts wrapped last night and then this am still bleeding. Pt finger currently wrapped.

## 2014-03-31 NOTE — ED Notes (Signed)
Pt made aware to return if symptoms worsen or if any life threatening symptoms occur.   

## 2014-04-24 ENCOUNTER — Ambulatory Visit: Payer: Medicare Other | Admitting: Cardiovascular Disease

## 2014-06-23 ENCOUNTER — Other Ambulatory Visit: Payer: Self-pay | Admitting: Internal Medicine

## 2014-08-12 ENCOUNTER — Encounter: Payer: Self-pay | Admitting: *Deleted

## 2014-08-12 ENCOUNTER — Other Ambulatory Visit: Payer: Self-pay | Admitting: *Deleted

## 2014-08-12 DIAGNOSIS — N186 End stage renal disease: Secondary | ICD-10-CM

## 2014-08-12 DIAGNOSIS — Z992 Dependence on renal dialysis: Principal | ICD-10-CM

## 2014-08-12 DIAGNOSIS — E785 Hyperlipidemia, unspecified: Secondary | ICD-10-CM

## 2014-08-12 DIAGNOSIS — I4891 Unspecified atrial fibrillation: Secondary | ICD-10-CM

## 2014-08-12 DIAGNOSIS — Z951 Presence of aortocoronary bypass graft: Secondary | ICD-10-CM

## 2014-08-27 ENCOUNTER — Other Ambulatory Visit: Payer: Self-pay | Admitting: Cardiovascular Disease

## 2014-08-28 LAB — COMPREHENSIVE METABOLIC PANEL
A/G RATIO: 1.8 (ref 1.1–2.5)
ALT: 8 IU/L (ref 0–44)
AST: 11 IU/L (ref 0–40)
Albumin: 4.7 g/dL (ref 3.6–4.8)
Alkaline Phosphatase: 103 IU/L (ref 39–117)
BUN/Creatinine Ratio: 4 — ABNORMAL LOW (ref 10–22)
BUN: 33 mg/dL — AB (ref 8–27)
Bilirubin Total: 0.2 mg/dL (ref 0.0–1.2)
CO2: 26 mmol/L (ref 18–29)
CREATININE: 8.78 mg/dL — AB (ref 0.76–1.27)
Calcium: 9.3 mg/dL (ref 8.6–10.2)
Chloride: 93 mmol/L — ABNORMAL LOW (ref 97–108)
GFR calc non Af Amer: 6 mL/min/{1.73_m2} — ABNORMAL LOW (ref >59)
GFR, EST AFRICAN AMERICAN: 6 mL/min/{1.73_m2} — AB (ref >59)
GLUCOSE: 68 mg/dL (ref 65–99)
Globulin, Total: 2.6 g/dL (ref 1.5–4.5)
Potassium: 4.5 mmol/L (ref 3.5–5.2)
Sodium: 141 mmol/L (ref 134–144)
TOTAL PROTEIN: 7.3 g/dL (ref 6.0–8.5)

## 2014-08-28 LAB — CBC/DIFF AMBIGUOUS DEFAULT
BASOS: 0 %
Basophils Absolute: 0 10*3/uL (ref 0.0–0.2)
EOS (ABSOLUTE): 0.3 10*3/uL (ref 0.0–0.4)
EOS: 2 %
Hematocrit: 37.3 % — ABNORMAL LOW (ref 37.5–51.0)
Hemoglobin: 13 g/dL (ref 12.6–17.7)
IMMATURE GRANS (ABS): 0 10*3/uL (ref 0.0–0.1)
IMMATURE GRANULOCYTES: 0 %
LYMPHS: 19 %
Lymphocytes Absolute: 2.4 10*3/uL (ref 0.7–3.1)
MCH: 33.6 pg — ABNORMAL HIGH (ref 26.6–33.0)
MCHC: 34.9 g/dL (ref 31.5–35.7)
MCV: 96 fL (ref 79–97)
Monocytes Absolute: 1 10*3/uL — ABNORMAL HIGH (ref 0.1–0.9)
Monocytes: 9 %
NEUTROS PCT: 70 %
Neutrophils Absolute: 8.4 10*3/uL — ABNORMAL HIGH (ref 1.4–7.0)
Platelets: 280 10*3/uL (ref 150–379)
RBC: 3.87 x10E6/uL — ABNORMAL LOW (ref 4.14–5.80)
RDW: 14.1 % (ref 12.3–15.4)
WBC: 12.2 10*3/uL — AB (ref 3.4–10.8)

## 2014-08-28 LAB — TSH: TSH: 2.04 u[IU]/mL (ref 0.450–4.500)

## 2014-08-28 LAB — LIPID PANEL W/O CHOL/HDL RATIO
Cholesterol, Total: 87 mg/dL — ABNORMAL LOW (ref 100–199)
HDL: 38 mg/dL — AB (ref >39)
LDL Calculated: 32 mg/dL (ref 0–99)
Triglycerides: 84 mg/dL (ref 0–149)
VLDL CHOLESTEROL CAL: 17 mg/dL (ref 5–40)

## 2014-08-28 LAB — AMBIG ABBREV CMP14 DEFAULT

## 2014-08-28 LAB — AMBIG ABBREV LP DEFAULT

## 2014-09-02 ENCOUNTER — Encounter: Payer: Self-pay | Admitting: *Deleted

## 2014-09-22 ENCOUNTER — Other Ambulatory Visit: Payer: Self-pay | Admitting: Nephrology

## 2014-09-22 ENCOUNTER — Ambulatory Visit
Admission: RE | Admit: 2014-09-22 | Discharge: 2014-09-22 | Disposition: A | Discharge status: Home / Self Care | Payer: Medicare Other | Source: Ambulatory Visit | Attending: Nephrology | Admitting: Nephrology

## 2014-09-22 DIAGNOSIS — R042 Hemoptysis: Secondary | ICD-10-CM

## 2015-02-10 ENCOUNTER — Emergency Department (HOSPITAL_COMMUNITY)
Admission: EM | Admit: 2015-02-10 | Discharge: 2015-02-10 | Disposition: A | Discharge status: Home / Self Care | Payer: Medicare Other | Attending: Emergency Medicine | Admitting: Emergency Medicine

## 2015-02-10 ENCOUNTER — Emergency Department (HOSPITAL_COMMUNITY): Payer: Medicare Other

## 2015-02-10 ENCOUNTER — Encounter (HOSPITAL_COMMUNITY): Payer: Self-pay | Admitting: *Deleted

## 2015-02-10 DIAGNOSIS — I509 Heart failure, unspecified: Secondary | ICD-10-CM | POA: Insufficient documentation

## 2015-02-10 DIAGNOSIS — K219 Gastro-esophageal reflux disease without esophagitis: Secondary | ICD-10-CM | POA: Insufficient documentation

## 2015-02-10 DIAGNOSIS — I2510 Atherosclerotic heart disease of native coronary artery without angina pectoris: Secondary | ICD-10-CM | POA: Diagnosis not present

## 2015-02-10 DIAGNOSIS — E785 Hyperlipidemia, unspecified: Secondary | ICD-10-CM | POA: Insufficient documentation

## 2015-02-10 DIAGNOSIS — Z7984 Long term (current) use of oral hypoglycemic drugs: Secondary | ICD-10-CM | POA: Insufficient documentation

## 2015-02-10 DIAGNOSIS — F17210 Nicotine dependence, cigarettes, uncomplicated: Secondary | ICD-10-CM | POA: Diagnosis not present

## 2015-02-10 DIAGNOSIS — I120 Hypertensive chronic kidney disease with stage 5 chronic kidney disease or end stage renal disease: Secondary | ICD-10-CM | POA: Insufficient documentation

## 2015-02-10 DIAGNOSIS — Z7982 Long term (current) use of aspirin: Secondary | ICD-10-CM | POA: Diagnosis not present

## 2015-02-10 DIAGNOSIS — Z992 Dependence on renal dialysis: Secondary | ICD-10-CM | POA: Insufficient documentation

## 2015-02-10 DIAGNOSIS — Z951 Presence of aortocoronary bypass graft: Secondary | ICD-10-CM | POA: Diagnosis not present

## 2015-02-10 DIAGNOSIS — J441 Chronic obstructive pulmonary disease with (acute) exacerbation: Secondary | ICD-10-CM | POA: Diagnosis not present

## 2015-02-10 DIAGNOSIS — M1990 Unspecified osteoarthritis, unspecified site: Secondary | ICD-10-CM | POA: Diagnosis not present

## 2015-02-10 DIAGNOSIS — R319 Hematuria, unspecified: Secondary | ICD-10-CM | POA: Diagnosis not present

## 2015-02-10 DIAGNOSIS — Z8546 Personal history of malignant neoplasm of prostate: Secondary | ICD-10-CM | POA: Diagnosis not present

## 2015-02-10 DIAGNOSIS — N186 End stage renal disease: Secondary | ICD-10-CM | POA: Diagnosis not present

## 2015-02-10 DIAGNOSIS — Z79899 Other long term (current) drug therapy: Secondary | ICD-10-CM | POA: Insufficient documentation

## 2015-02-10 DIAGNOSIS — Z9889 Other specified postprocedural states: Secondary | ICD-10-CM | POA: Diagnosis not present

## 2015-02-10 DIAGNOSIS — Z87448 Personal history of other diseases of urinary system: Secondary | ICD-10-CM | POA: Diagnosis not present

## 2015-02-10 DIAGNOSIS — E119 Type 2 diabetes mellitus without complications: Secondary | ICD-10-CM | POA: Insufficient documentation

## 2015-02-10 DIAGNOSIS — R0602 Shortness of breath: Secondary | ICD-10-CM | POA: Diagnosis present

## 2015-02-10 DIAGNOSIS — R011 Cardiac murmur, unspecified: Secondary | ICD-10-CM | POA: Diagnosis not present

## 2015-02-10 LAB — URINE MICROSCOPIC-ADD ON

## 2015-02-10 LAB — I-STAT ARTERIAL BLOOD GAS, ED
Acid-Base Excess: 11 mmol/L — ABNORMAL HIGH (ref 0.0–2.0)
BICARBONATE: 34.5 meq/L — AB (ref 20.0–24.0)
O2 SAT: 98 %
PCO2 ART: 40.4 mmHg (ref 35.0–45.0)
PO2 ART: 86 mmHg (ref 80.0–100.0)
Patient temperature: 97.8
TCO2: 36 mmol/L (ref 0–100)
pH, Arterial: 7.538 — ABNORMAL HIGH (ref 7.350–7.450)

## 2015-02-10 LAB — CBC
HCT: 32.4 % — ABNORMAL LOW (ref 39.0–52.0)
Hemoglobin: 10.3 g/dL — ABNORMAL LOW (ref 13.0–17.0)
MCH: 32.9 pg (ref 26.0–34.0)
MCHC: 31.8 g/dL (ref 30.0–36.0)
MCV: 103.5 fL — AB (ref 78.0–100.0)
PLATELETS: 309 10*3/uL (ref 150–400)
RBC: 3.13 MIL/uL — ABNORMAL LOW (ref 4.22–5.81)
RDW: 15.7 % — AB (ref 11.5–15.5)
WBC: 9.7 10*3/uL (ref 4.0–10.5)

## 2015-02-10 LAB — URINALYSIS, ROUTINE W REFLEX MICROSCOPIC
Bilirubin Urine: NEGATIVE
GLUCOSE, UA: NEGATIVE mg/dL
Ketones, ur: NEGATIVE mg/dL
Leukocytes, UA: NEGATIVE
Nitrite: NEGATIVE
PH: 8.5 — AB (ref 5.0–8.0)
Protein, ur: >300 mg/dL — AB
SPECIFIC GRAVITY, URINE: 1.012 (ref 1.005–1.030)

## 2015-02-10 LAB — BASIC METABOLIC PANEL
Anion gap: 14 (ref 5–15)
BUN: 20 mg/dL (ref 6–20)
CHLORIDE: 93 mmol/L — AB (ref 101–111)
CO2: 35 mmol/L — ABNORMAL HIGH (ref 22–32)
CREATININE: 6.34 mg/dL — AB (ref 0.61–1.24)
Calcium: 9.2 mg/dL (ref 8.9–10.3)
GFR calc Af Amer: 9 mL/min — ABNORMAL LOW (ref >60)
GFR calc non Af Amer: 8 mL/min — ABNORMAL LOW (ref >60)
Glucose, Bld: 92 mg/dL (ref 65–99)
Potassium: 3.6 mmol/L (ref 3.5–5.1)
SODIUM: 142 mmol/L (ref 135–145)

## 2015-02-10 LAB — I-STAT TROPONIN, ED: Troponin i, poc: 0.26 ng/mL (ref 0.00–0.08)

## 2015-02-10 MED ORDER — PREDNISONE 20 MG PO TABS: 20.0000 mg | ORAL_TABLET | Freq: Two times a day (BID) | ORAL | Status: AC

## 2015-02-10 MED ORDER — METHYLPREDNISOLONE SODIUM SUCC 125 MG IJ SOLR
125.0000 mg | Freq: Once | INTRAMUSCULAR | Status: AC
  Administered 2015-02-10: 125 mg via INTRAVENOUS
  Filled 2015-02-10: qty 2

## 2015-02-10 MED ORDER — DOXYCYCLINE HYCLATE 100 MG PO CAPS: 100.0000 mg | ORAL_CAPSULE | Freq: Two times a day (BID) | ORAL | Status: AC

## 2015-02-10 MED ORDER — IPRATROPIUM BROMIDE 0.02 % IN SOLN
0.5000 mg | Freq: Once | RESPIRATORY_TRACT | Status: AC
  Administered 2015-02-10: 0.5 mg via RESPIRATORY_TRACT
  Filled 2015-02-10: qty 2.5

## 2015-02-10 MED ORDER — ALBUTEROL SULFATE HFA 108 (90 BASE) MCG/ACT IN AERS
2.0000 | INHALATION_SPRAY | RESPIRATORY_TRACT | Status: AC
Start: 2015-02-10 — End: ?

## 2015-02-10 MED ORDER — ALBUTEROL SULFATE (2.5 MG/3ML) 0.083% IN NEBU
2.5000 mg | INHALATION_SOLUTION | RESPIRATORY_TRACT | Status: DC | PRN
  Administered 2015-02-10: 2.5 mg via RESPIRATORY_TRACT
  Filled 2015-02-10: qty 3

## 2015-02-10 NOTE — ED Provider Notes (Signed)
CSN: [id number]     Arrival date & time 02/10/15  1522 History   First MD Initiated Contact with Patient 02/10/15 1643     Chief Complaint  Patient presents with  . Shortness of Breath  . Hematuria      HPI  She presents for evaluation of low oxygen level. He states he's had a dry cough for a week. When he is been a dialysis the last 2 days he has had his oxygen saturations while at rest of 8586%. Wife describes that he "wheezes" when he coughs. Has a 50+-pack-year smoking history. Has never been diagnosed with COPD to the patient's knowledge. He does not use inhalers.  Chest pain. Denies shortness of breath. Denies fever. Frequent cough. Occasional wheezing. Nonproductive cough. No swelling to extremities.  Patient also states that he is intermittently seen blood in his urine over the last few days. He has had to have some surgical metal removed through his bladder wall that is secondary to a prior radical prostatectomy surgery. He has an appointment with his urologist tomorrow. He has no dysuria frequency.  Past Medical History  Diagnosis Date  . Hypertension   . Joint pain   . Gout   . GERD (gastroesophageal reflux disease)   . Arthritis   . Bladder neck contracture   . Elevated prostate specific antigen (PSA)   . Urethral stricture unspecified   . Murmur, cardiac 04/21/2013  . CHF (congestive heart failure) ([place])   . Shortness of breath   . Dialysis patient ([place])   . Hyperlipidemia   . Diabetes mellitus   . Type 2 diabetes mellitus ([place])   . Coronary artery disease   . Inguinal hernia   . Chronic renal insufficiency   . ESRD (end stage renal disease) ([place])     T, Th, Sat, [address]  . Prostate cancer [facility])     adenocarcinoma gleason 7   Past Surgical History  Procedure Laterality Date  . Appendectomy  70 yrs old    open  . Av fistula placement, brachiocephalic  0000000  . Robot assisted laparoscopic radical prostatectomy N/A 05/18/2012    Procedure: ROBOTIC ASSISTED  LAPAROSCOPIC RADICAL PROSTATECTOMY;  Surgeon: Alexis Frock, MD;  Location: [facility];  Service: Urology;  Laterality: N/A;  . Lymphadenectomy Bilateral 05/18/2012    Procedure: LYMPHADENECTOMY;  Surgeon: Alexis Frock, MD;  Location: [facility];  Service: Urology;  Laterality: Bilateral;  . Circumcision, non-newborn    . Kidney surgery    . Pelvic laparoscopy    . Coronary artery bypass graft N/A 08/19/2013    Procedure: CORONARY ARTERY BYPASS GRAFTING (CABG) times 5 using left internal mammary artery to LAD, saphenous vein graft to diagonal; sequential saphenous vein graft to intermediate and distal circumflex; and saphenous vein graft to right coronary artery ;  Surgeon: [name], MD;  Location: [place];  Service: Open Heart Surgery;  Laterality: N/A;  . Intraoperative transesophageal echocardiogram N/A 08/19/2013    Procedure: INTRAOPERATIVE TRANSESOPHAGEAL ECHOCARDIOGRAM;  Surgeon: [name], MD;  Location: [place];  Service: Open Heart Surgery;  Laterality: N/A;  . Endovein harvest of greater saphenous vein Left 08/19/2013    Procedure: ENDOVEIN HARVEST OF GREATER SAPHENOUS VEIN from left thigh and calf;  Surgeon: [name], MD;  Location: [place];  Service: Open Heart Surgery;  Laterality: Left;  . Left heart catheterization with coronary angiogram N/A 08/14/2013    Procedure: LEFT HEART CATHETERIZATION WITH CORONARY ANGIOGRAM;  Surgeon: Wellington Hampshire, MD;  Location: [place] [facility];  Service: Cardiovascular;  Laterality: N/A;  . Inguinal hernia repair Right 01/17/2014    Procedure: LAPAROSCOPIC RIGHT INGUINAL HERNIA REPAIR ;  Surgeon: Ralene Ok, MD;  Location: [place];  Service: General;  Laterality: Right;  . Insertion of mesh Right 01/17/2014    Procedure: INSERTION OF MESH;  Surgeon: Ralene Ok, MD;  Location: [place];  Service: General;  Laterality: Right;  . Cystoscopy w/ retrogrades Bilateral 02/05/2014    Procedure: CYSTOSCOPY WITH RETROGRADE PYELOGRAM, REMOVAL OF ERODED  CLIP;  Surgeon: Alexis Frock, MD;  Location: [facility];  Service: Urology;  Laterality: Bilateral;  . Holmium laser application N/A [id number]    Procedure: HOLMIUM LASER APPLICATION;  Surgeon: Alexis Frock, MD;  Location: [facility];  Service: Urology;  Laterality: N/A;   Family History  Problem Relation Age of Onset  . Diabetes Brother   . Kidney disease Brother   . Hypertension Brother   . Stroke Mother   . Hypertension Sister   . Hypertension Brother   . Hypertension Brother   . Hypertension Sister    Social History  Substance Use Topics  . Smoking status: Current Every Day Smoker -- 0.25 packs/day for 40 years    Types: Cigarettes  . Smokeless tobacco: Never Used     Comment: reports also using e- cigarette but trying to quit  . Alcohol Use: No    Review of Systems  Constitutional: Negative for fever, chills, diaphoresis, appetite change and fatigue.  HENT: Negative for mouth sores, sore throat and trouble swallowing.   Eyes: Negative for visual disturbance.  Respiratory: Positive for cough, shortness of breath and wheezing. Negative for chest tightness.   Cardiovascular: Negative for chest pain.  Gastrointestinal: Negative for nausea, vomiting, abdominal pain, diarrhea and abdominal distention.  Endocrine: Negative for polydipsia, polyphagia and polyuria.  Genitourinary: Positive for hematuria. Negative for dysuria and frequency.  Musculoskeletal: Negative for gait problem.  Skin: Negative for color change, pallor and rash.  Neurological: Negative for dizziness, syncope, light-headedness and headaches.  Hematological: Does not bruise/bleed easily.  Psychiatric/Behavioral: Negative for behavioral problems and confusion.      Allergies  Ace inhibitors  Home Medications   Prior to Admission medications   Medication Sig Start Date End Date Taking? Authorizing Provider  albuterol (PROVENTIL HFA;VENTOLIN HFA) 108 (90 Base) MCG/ACT inhaler Inhale 2 puffs into the lungs  every 4 (four) hours. 02/10/15   Tanna Furry, MD  amLODipine (NORVASC) 2.5 MG tablet Take 2.5 mg by mouth daily.    Historical Provider, MD  aspirin EC 81 MG tablet Take 81 mg by mouth daily.    Historical Provider, MD  atorvastatin (LIPITOR) 20 MG tablet Take 20 mg by mouth every morning.    Historical Provider, MD  cinacalcet (SENSIPAR) 30 MG tablet Take 30 mg by mouth daily.    Historical Provider, MD  darbepoetin (ARANESP) 25 MCG/0.42ML SOLN injection Inject 0.42 mLs (25 mcg total) into the vein every Thursday with hemodialysis. 09/06/13   [name], PA-C  doxycycline (VIBRAMYCIN) 100 MG capsule Take 1 capsule (100 mg total) by mouth 2 (two) times daily. 02/10/15   Tanna Furry, MD  ferric gluconate 125 mg in sodium chloride 0.9 % 100 mL Inject 125 mg into the vein Every Tuesday,Thursday,and Saturday with dialysis. 09/06/13   [name], PA-C  glipiZIDE (GLUCOTROL XL) 2.5 MG 24 hr tablet Take 1 tablet by mouth daily before supper. 10/30/13   Historical Provider, MD  metoprolol succinate (TOPROL-XL) 25  MG 24 hr tablet Take 1 tablet on non dialysis days. 1/2 tablet on dialysis days 02/13/14   Historical Provider, MD  multivitamin (RENA-VIT) TABS tablet Take 1 tablet by mouth daily. 12/10/13   Historical Provider, MD  naphazoline-pheniramine (NAPHCON-A) 0.025-0.3 % ophthalmic solution Place 1 drop into both eyes 4 (four) times daily as needed for irritation. 09/06/13   [name], PA-C  oxyCODONE-acetaminophen (ROXICET) 5-325 MG per tablet Take 1-2 tablets by mouth every 6 (six) hours as needed for moderate pain or severe pain. Post-operatively 02/05/14   Alexis Frock, MD  pantoprazole (PROTONIX) 40 MG tablet Take 40 mg by mouth 2 (two) times daily.    Historical Provider, MD  predniSONE (DELTASONE) 20 MG tablet Take 1 tablet (20 mg total) by mouth 2 (two) times daily with a meal. 02/10/15   Tanna Furry, MD  senna-docusate (SENOKOT-S) 8.6-50 MG per tablet Take 1 tablet by mouth 2 (two) times daily. While  taking pain meds to prevent constipation 02/05/14   Alexis Frock, MD  sevelamer carbonate (RENVELA) 800 MG tablet Take 1,600-2,400 mg by mouth 3 (three) times daily with meals.     Historical Provider, MD  tamsulosin (FLOMAX) 0.4 MG CAPS capsule Take 1 capsule by mouth daily. 01/09/14   Historical Provider, MD  VOLTAREN 1 % GEL Take 1 application by mouth daily as needed (for pain).  12/24/13   Historical Provider, MD   BP 148/77 mmHg  Pulse 95  Temp(Src) 97.8 F (36.6 C) (Oral)  Resp 15  SpO2 95% Physical Exam  Constitutional: He is oriented to person, place, and time. He appears well-developed and well-nourished. No distress.  HENT:  Head: Normocephalic.  Eyes: Conjunctivae are normal. Pupils are equal, round, and reactive to light. No scleral icterus.  Neck: Normal range of motion. Neck supple. No thyromegaly present.  Cardiovascular: Normal rate and regular rhythm.  Exam reveals no gallop and no friction rub.   No murmur heard. Pulmonary/Chest: Effort normal. No respiratory distress. He has decreased breath sounds. He has wheezes. He has no rales.  Globally prolonged and diminished breath sounds with end expiratory wheezing.  Abdominal: Soft. Bowel sounds are normal. He exhibits no distension. There is no tenderness. There is no rebound.  Musculoskeletal: Normal range of motion.  Neurological: He is alert and oriented to person, place, and time.  Skin: Skin is warm and dry. No rash noted.  Psychiatric: He has a normal mood and affect. His behavior is normal.    ED Course  Procedures (including critical care time) Labs Review Labs Reviewed  BASIC METABOLIC PANEL - Abnormal; Notable for the following:    Chloride 93 (*)    CO2 35 (*)    Creatinine, Ser 6.34 (*)    GFR calc non Af Amer 8 (*)    GFR calc Af Amer 9 (*)    All other components within normal limits  CBC - Abnormal; Notable for the following:    RBC 3.13 (*)    Hemoglobin 10.3 (*)    HCT 32.4 (*)    MCV 103.5  (*)    RDW 15.7 (*)    All other components within normal limits  I-STAT TROPOININ, ED - Abnormal; Notable for the following:    Troponin i, poc 0.26 (*)    All other components within normal limits  I-STAT ARTERIAL BLOOD GAS, ED - Abnormal; Notable for the following:    pH, Arterial 7.538 (*)    Bicarbonate 34.5 (*)    Acid-Base  Excess 11.0 (*)    All other components within normal limits  BLOOD GAS, ARTERIAL    Imaging Review Dg Chest 2 View  02/10/2015  CLINICAL DATA:  Shortness of breath for 1 week, history of diabetes EXAM: CHEST  2 VIEW COMPARISON:  09/22/2014 FINDINGS: Cardiomegaly is noted. Status post CABG. There is central mild vascular congestion. Mild perihilar interstitial prominence suspicious for mild interstitial edema. No segmental infiltrate. Mild basilar atelectasis. IMPRESSION: Central vascular congestion and mild perihilar interstitial prominence suspicious for mild interstitial edema. Mild basilar atelectasis. No segmental infiltrate. Status post CABG. Electronically Signed   By: Lahoma Crocker M.D.   On: 02/10/2015 16:51   I have personally reviewed and evaluated these images and lab results as part of my medical decision-making.   EKG Interpretation   Date/Time:  Tuesday February 10 2015 15:33:17 EST Ventricular Rate:  98 PR Interval:  212 QRS Duration: 96 QT Interval:  392 QTC Calculation: 500 R Axis:   94 Text Interpretation:  Sinus rhythm with 1st degree A-V block Possible Left  atrial enlargement Rightward axis ST \\T \ T wave abnormality, consider  lateral ischemia Prolonged QT Abnormal ECG Sinus rhythm with 1st degree  A-V block ST-t wave abnormality Abnormal ekg Confirmed by Carmin Muskrat   MD ([id number]) on 02/10/2015 3:41:26 PM      MDM   Final diagnoses:  COPD exacerbation ([place])    Patient initially with markedly diminished breath sounds. Given nebulized of-year-old 1 hour. On recheck his lungs show much improved aeration. He is saturating 90-98%  in the room and ambulatory in the department. Lungs are clear. No signs of venous congestion on chest x-ray. Clinically not in congestive heart failure. He is appropriate for home treatment. Has never been on treatment for COPD despite a 50+ pack year smoking history. Given albuterol, prednisone, doxepin prescriptions rest for postauricular physician. Return to ER with any acute changes.  Changes on EKG. Troponin shows chronic elevation. Patient denies chest pain. No pleuritic discomfort. Is not tachycardic. PE. Doubt ACS. Patient has responded the above therapy with improvement in his exam and oxygenation.   Tanna Furry, MD 02/10/15 1945

## 2015-02-10 NOTE — Discharge Instructions (Signed)

## 2015-02-10 NOTE — ED Notes (Signed)
Pt consistently desat to low 80s. Pt placed on 2L [place].

## 2015-02-10 NOTE — ED Notes (Signed)
Pt states that he has been short of breath x 1 week. States that when he has been at dialysis this week he has required oxygen. No history of same. Also c/o blood in urine. Tu Thu Sat dialysis. Did go today.

## 2015-03-09 ENCOUNTER — Encounter (HOSPITAL_COMMUNITY): Payer: Self-pay | Admitting: Emergency Medicine

## 2015-03-09 ENCOUNTER — Emergency Department (HOSPITAL_COMMUNITY)
Admission: EM | Admit: 2015-03-09 | Discharge: 2015-03-09 | Disposition: A | Discharge status: Home / Self Care | Payer: Medicare Other | Attending: Emergency Medicine | Admitting: Emergency Medicine

## 2015-03-09 ENCOUNTER — Emergency Department (HOSPITAL_COMMUNITY): Payer: Medicare Other

## 2015-03-09 DIAGNOSIS — H6122 Impacted cerumen, left ear: Secondary | ICD-10-CM | POA: Insufficient documentation

## 2015-03-09 DIAGNOSIS — Z7982 Long term (current) use of aspirin: Secondary | ICD-10-CM | POA: Diagnosis not present

## 2015-03-09 DIAGNOSIS — E119 Type 2 diabetes mellitus without complications: Secondary | ICD-10-CM | POA: Insufficient documentation

## 2015-03-09 DIAGNOSIS — N186 End stage renal disease: Secondary | ICD-10-CM | POA: Insufficient documentation

## 2015-03-09 DIAGNOSIS — Y9389 Activity, other specified: Secondary | ICD-10-CM | POA: Insufficient documentation

## 2015-03-09 DIAGNOSIS — S161XXA Strain of muscle, fascia and tendon at neck level, initial encounter: Secondary | ICD-10-CM | POA: Insufficient documentation

## 2015-03-09 DIAGNOSIS — I2510 Atherosclerotic heart disease of native coronary artery without angina pectoris: Secondary | ICD-10-CM | POA: Diagnosis not present

## 2015-03-09 DIAGNOSIS — H9202 Otalgia, left ear: Secondary | ICD-10-CM | POA: Insufficient documentation

## 2015-03-09 DIAGNOSIS — E785 Hyperlipidemia, unspecified: Secondary | ICD-10-CM | POA: Insufficient documentation

## 2015-03-09 DIAGNOSIS — Z992 Dependence on renal dialysis: Secondary | ICD-10-CM | POA: Diagnosis not present

## 2015-03-09 DIAGNOSIS — Z8546 Personal history of malignant neoplasm of prostate: Secondary | ICD-10-CM | POA: Insufficient documentation

## 2015-03-09 DIAGNOSIS — Z7984 Long term (current) use of oral hypoglycemic drugs: Secondary | ICD-10-CM | POA: Insufficient documentation

## 2015-03-09 DIAGNOSIS — Z951 Presence of aortocoronary bypass graft: Secondary | ICD-10-CM | POA: Insufficient documentation

## 2015-03-09 DIAGNOSIS — Y998 Other external cause status: Secondary | ICD-10-CM | POA: Insufficient documentation

## 2015-03-09 DIAGNOSIS — R05 Cough: Secondary | ICD-10-CM | POA: Insufficient documentation

## 2015-03-09 DIAGNOSIS — I120 Hypertensive chronic kidney disease with stage 5 chronic kidney disease or end stage renal disease: Secondary | ICD-10-CM | POA: Insufficient documentation

## 2015-03-09 DIAGNOSIS — R011 Cardiac murmur, unspecified: Secondary | ICD-10-CM | POA: Diagnosis not present

## 2015-03-09 DIAGNOSIS — F17210 Nicotine dependence, cigarettes, uncomplicated: Secondary | ICD-10-CM | POA: Insufficient documentation

## 2015-03-09 DIAGNOSIS — Y9289 Other specified places as the place of occurrence of the external cause: Secondary | ICD-10-CM | POA: Diagnosis not present

## 2015-03-09 DIAGNOSIS — M1990 Unspecified osteoarthritis, unspecified site: Secondary | ICD-10-CM | POA: Diagnosis not present

## 2015-03-09 DIAGNOSIS — K219 Gastro-esophageal reflux disease without esophagitis: Secondary | ICD-10-CM | POA: Insufficient documentation

## 2015-03-09 DIAGNOSIS — I509 Heart failure, unspecified: Secondary | ICD-10-CM | POA: Diagnosis not present

## 2015-03-09 DIAGNOSIS — Z9889 Other specified postprocedural states: Secondary | ICD-10-CM | POA: Insufficient documentation

## 2015-03-09 DIAGNOSIS — Z79899 Other long term (current) drug therapy: Secondary | ICD-10-CM | POA: Insufficient documentation

## 2015-03-09 DIAGNOSIS — M25512 Pain in left shoulder: Secondary | ICD-10-CM | POA: Insufficient documentation

## 2015-03-09 DIAGNOSIS — X58XXXA Exposure to other specified factors, initial encounter: Secondary | ICD-10-CM | POA: Diagnosis not present

## 2015-03-09 MED ORDER — METHOCARBAMOL 500 MG PO TABS: 500.0000 mg | ORAL_TABLET | Freq: Two times a day (BID) | ORAL | Status: AC

## 2015-03-09 NOTE — ED Notes (Signed)
Pt c/o left ear pain into left side of neck and pain with cough

## 2015-03-09 NOTE — ED Notes (Signed)
Pt states neck and ear mostly hurt when he cough, he has to hold his neck when he needs to cough, pt is having some SOB, pt need HD on Tuesdays, Th, Saturday, las HD last Saturday. Pt resting on bed, wife at the bedside, NAD noticed.

## 2015-03-09 NOTE — Discharge Instructions (Signed)

## 2015-03-09 NOTE — ED Provider Notes (Signed)
CSN: [id number]     Arrival date & time 03/09/15  1802 History   First MD Initiated Contact with Patient 03/09/15 2122     Chief Complaint  Patient presents with  . Otalgia  . Neck Pain  . Cough     (Consider location/radiation/quality/duration/timing/severity/associated sxs/prior Treatment) Patient is a 70 y.o. male presenting with ear pain, neck pain, and cough. The history is provided by the patient.  Otalgia Location:  Left Quality:  Aching Severity:  Moderate Onset quality:  Sudden Duration:  4 days Timing:  Constant Progression:  Resolved Chronicity:  New Associated symptoms: cough and neck pain   Associated symptoms: no abdominal pain, no congestion, no diarrhea, no fever, no headaches, no rash, no sore throat and no vomiting   Neck Pain Pain location:  L side Quality:  Stiffness Pain severity:  Moderate Progression:  Unchanged Exacerbated by: Palpation and head turning. Associated symptoms: no chest pain, no fever, no headaches and no weakness   Cough Associated symptoms: ear pain   Associated symptoms: no chest pain, no chills, no fever, no headaches, no rash, no shortness of breath and no sore throat     Past Medical History  Diagnosis Date  . Hypertension   . Joint pain   . Gout   . GERD (gastroesophageal reflux disease)   . Arthritis   . Bladder neck contracture   . Elevated prostate specific antigen (PSA)   . Urethral stricture unspecified   . Murmur, cardiac 04/21/2013  . CHF (congestive heart failure) ([place])   . Shortness of breath   . Dialysis patient ([place])   . Hyperlipidemia   . Diabetes mellitus   . Type 2 diabetes mellitus ([place])   . Coronary artery disease   . Inguinal hernia   . Chronic renal insufficiency   . ESRD (end stage renal disease) ([place])     T, Th, Sat, [address]  . Prostate cancer [facility])     adenocarcinoma gleason 7   Past Surgical History  Procedure Laterality Date  . Appendectomy  70 yrs old    open  . Av fistula  placement, brachiocephalic  0000000  . Robot assisted laparoscopic radical prostatectomy N/A 05/18/2012    Procedure: ROBOTIC ASSISTED LAPAROSCOPIC RADICAL PROSTATECTOMY;  Surgeon: Alexis Frock, MD;  Location: [facility];  Service: Urology;  Laterality: N/A;  . Lymphadenectomy Bilateral 05/18/2012    Procedure: LYMPHADENECTOMY;  Surgeon: Alexis Frock, MD;  Location: [facility];  Service: Urology;  Laterality: Bilateral;  . Circumcision, non-newborn    . Kidney surgery    . Pelvic laparoscopy    . Coronary artery bypass graft N/A 08/19/2013    Procedure: CORONARY ARTERY BYPASS GRAFTING (CABG) times 5 using left internal mammary artery to LAD, saphenous vein graft to diagonal; sequential saphenous vein graft to intermediate and distal circumflex; and saphenous vein graft to right coronary artery ;  Surgeon: [name], MD;  Location: [place];  Service: Open Heart Surgery;  Laterality: N/A;  . Intraoperative transesophageal echocardiogram N/A 08/19/2013    Procedure: INTRAOPERATIVE TRANSESOPHAGEAL ECHOCARDIOGRAM;  Surgeon: [name], MD;  Location: [place];  Service: Open Heart Surgery;  Laterality: N/A;  . Endovein harvest of greater saphenous vein Left 08/19/2013    Procedure: ENDOVEIN HARVEST OF GREATER SAPHENOUS VEIN from left thigh and calf;  Surgeon: [name], MD;  Location: [place];  Service: Open Heart Surgery;  Laterality: Left;  . Left heart catheterization with coronary angiogram N/A 08/14/2013    Procedure:  LEFT HEART CATHETERIZATION WITH CORONARY ANGIOGRAM;  Surgeon: Wellington Hampshire, MD;  Location: [facility] CATH LAB;  Service: Cardiovascular;  Laterality: N/A;  . Inguinal hernia repair Right 01/17/2014    Procedure: LAPAROSCOPIC RIGHT INGUINAL HERNIA REPAIR ;  Surgeon: Ralene Ok, MD;  Location: [place];  Service: General;  Laterality: Right;  . Insertion of mesh Right 01/17/2014    Procedure: INSERTION OF MESH;  Surgeon: Ralene Ok, MD;  Location: [place];  Service: General;  Laterality:  Right;  . Cystoscopy w/ retrogrades Bilateral 02/05/2014    Procedure: CYSTOSCOPY WITH RETROGRADE PYELOGRAM, REMOVAL OF ERODED CLIP;  Surgeon: Alexis Frock, MD;  Location: [facility];  Service: Urology;  Laterality: Bilateral;  . Holmium laser application N/A [id number]    Procedure: HOLMIUM LASER APPLICATION;  Surgeon: Alexis Frock, MD;  Location: [facility];  Service: Urology;  Laterality: N/A;   Family History  Problem Relation Age of Onset  . Diabetes Brother   . Kidney disease Brother   . Hypertension Brother   . Stroke Mother   . Hypertension Sister   . Hypertension Brother   . Hypertension Brother   . Hypertension Sister    Social History  Substance Use Topics  . Smoking status: Current Every Day Smoker -- 0.25 packs/day for 40 years    Types: Cigarettes  . Smokeless tobacco: Never Used     Comment: reports also using e- cigarette but trying to quit  . Alcohol Use: No    Review of Systems  Constitutional: Negative for fever, chills, appetite change and fatigue.  HENT: Positive for ear pain. Negative for congestion, facial swelling, mouth sores and sore throat.   Eyes: Negative for visual disturbance.  Respiratory: Positive for cough. Negative for chest tightness and shortness of breath.   Cardiovascular: Negative for chest pain and palpitations.  Gastrointestinal: Negative for nausea, vomiting, abdominal pain, diarrhea and blood in stool.  Endocrine: Negative for cold intolerance and heat intolerance.  Genitourinary: Negative for frequency, decreased urine volume and difficulty urinating.  Musculoskeletal: Positive for neck pain. Negative for back pain and neck stiffness.  Skin: Negative for rash.  Neurological: Negative for dizziness, weakness, light-headedness and headaches.  All other systems reviewed and are negative.     Allergies  Ace inhibitors  Home Medications   Prior to Admission medications   Medication Sig Start Date End Date Taking? Authorizing Provider   albuterol (PROVENTIL HFA;VENTOLIN HFA) 108 (90 Base) MCG/ACT inhaler Inhale 2 puffs into the lungs every 4 (four) hours. 02/10/15  Yes Tanna Furry, MD  amLODipine (NORVASC) 2.5 MG tablet Take 2.5 mg by mouth daily.   Yes Historical Provider, MD  aspirin EC 81 MG tablet Take 81 mg by mouth daily.   Yes Historical Provider, MD  atorvastatin (LIPITOR) 20 MG tablet Take 20 mg by mouth every morning.   Yes Historical Provider, MD  cinacalcet (SENSIPAR) 30 MG tablet Take 30 mg by mouth daily.   Yes Historical Provider, MD  darbepoetin (ARANESP) 25 MCG/0.42ML SOLN injection Inject 0.42 mLs (25 mcg total) into the vein every Thursday with hemodialysis. 09/06/13  Yes [name], PA-C  ferric gluconate 125 mg in sodium chloride 0.9 % 100 mL Inject 125 mg into the vein Every Tuesday,Thursday,and Saturday with dialysis. 09/06/13  Yes [name], PA-C  glipiZIDE (GLUCOTROL XL) 2.5 MG 24 hr tablet Take 1 tablet by mouth daily before supper. 10/30/13  Yes Historical Provider, MD  metoprolol succinate (TOPROL-XL) 25 MG 24 hr tablet Take 12.5-25 tablets  by mouth daily. Take 1 tablet on non dialysis days. 1/2 tablet on dialysis days 02/13/14  Yes Historical Provider, MD  multivitamin (RENA-VIT) TABS tablet Take 1 tablet by mouth daily. 12/10/13  Yes Historical Provider, MD  naphazoline-pheniramine (NAPHCON-A) 0.025-0.3 % ophthalmic solution Place 1 drop into both eyes 4 (four) times daily as needed for irritation. 09/06/13  Yes [name], PA-C  oxyCODONE-acetaminophen (ROXICET) 5-325 MG per tablet Take 1-2 tablets by mouth every 6 (six) hours as needed for moderate pain or severe pain. Post-operatively 02/05/14  Yes Alexis Frock, MD  pantoprazole (PROTONIX) 40 MG tablet Take 40 mg by mouth 2 (two) times daily.   Yes Historical Provider, MD  senna-docusate (SENOKOT-S) 8.6-50 MG per tablet Take 1 tablet by mouth 2 (two) times daily. While taking pain meds to prevent constipation 02/05/14  Yes Alexis Frock, MD   sevelamer carbonate (RENVELA) 800 MG tablet Take 1,600-2,400 mg by mouth 3 (three) times daily with meals. Takes 2 at lunch and 3 at dinner and breakfast   Yes Historical Provider, MD  tamsulosin (FLOMAX) 0.4 MG CAPS capsule Take 1 capsule by mouth daily. 01/09/14  Yes Historical Provider, MD  VOLTAREN 1 % GEL Take 1 application by mouth daily as needed (for pain).  12/24/13  Yes Historical Provider, MD  doxycycline (VIBRAMYCIN) 100 MG capsule Take 1 capsule (100 mg total) by mouth 2 (two) times daily. Patient not taking: Reported on 03/09/2015 02/10/15   Tanna Furry, MD  methocarbamol (ROBAXIN) 500 MG tablet Take 1 tablet (500 mg total) by mouth 2 (two) times daily. 03/09/15 03/18/15  Addison Lank, MD  predniSONE (DELTASONE) 20 MG tablet Take 1 tablet (20 mg total) by mouth 2 (two) times daily with a meal. Patient not taking: Reported on 03/09/2015 02/10/15   Tanna Furry, MD   BP 112/81 mmHg  Pulse 90  Temp(Src) 98.3 F (36.8 C) (Oral)  Resp 31  SpO2 93% Physical Exam  Constitutional: He is oriented to person, place, and time. He appears well-nourished. No distress.  HENT:  Head: Normocephalic and atraumatic.  Right Ear: External ear normal.  Left Ear: External ear normal.  Cerumen impaction in the left ear  Eyes: Pupils are equal, round, and reactive to light. Right eye exhibits no discharge. Left eye exhibits no discharge. No scleral icterus.  Neck: Normal range of motion. Neck supple. Muscular tenderness (Left superior fires of the trapezius) present.  Cardiovascular: Normal rate.  Exam reveals no gallop and no friction rub.   No murmur heard. Pulmonary/Chest: Effort normal and breath sounds normal. No stridor. No respiratory distress. He has no wheezes. He has no rales. He exhibits no tenderness.  Abdominal: Soft. He exhibits no distension and no mass. There is no tenderness. There is no rebound and no guarding.  Musculoskeletal: He exhibits no edema or tenderness.  Neurological: He is  alert and oriented to person, place, and time.  Skin: Skin is warm and dry. No rash noted. He is not diaphoretic. No erythema.    ED Course  Procedures (including critical care time) Labs Review Labs Reviewed - No data to display  Imaging Review Dg Chest 2 View  03/09/2015  CLINICAL DATA:  Chest pain and cough today. EXAM: CHEST  2 VIEW COMPARISON:  02/10/2015 FINDINGS: The heart is enlarged but stable. Stable surgical changes from bypass surgery. Mild vascular congestion but no overt pulmonary edema, pleural effusions or focal infiltrates. The bony thorax is intact. IMPRESSION: Cardiac enlargement and vascular congestion but no overt  pulmonary edema or pleural effusions. Electronically Signed   By: Marijo Sanes M.D.   On: 03/09/2015 18:34   I have personally reviewed and evaluated these images and lab results as part of my medical decision-making.   EKG Interpretation   Date/Time:  Monday March 09 2015 22:07:15 EST Ventricular Rate:  85 PR Interval:    QRS Duration: 121 QT Interval:  388 QTC Calculation: 461 R Axis:   156 Text Interpretation:  Sinus rhythm with 1st degree A-V block Nonspecific  intraventricular conduction delay Nonspecific T wave abnormality Confirmed  by Ashok Cordia  MD, Lennette Bihari ([id number]) on 03/09/2015 10:12:45 PM      MDM   70 year old male with a history of ESRD on dialysis presents with 4 days of left otalgia that has since resolved several days ago. Patient also with 3 days of left-sided neck pain which is muscular in nature. Rest of the history as above.  Patient also here for nonproductive cough. Chest x-ray with evidence of pronator vascular congestion however no evidence of pulmonary edema or pneumonia. Patient is scheduled for hemodialysis tomorrow morning. EKG without evidence of peaked T waves. Don't feel that emergent dialysis is necessary at this time.  He is safe for discharge with strict return precautions. Patient's follow up for dialysis in the  morning.  Sinuses studies interpreted by me and use to my clinical decision-making.  Patient seen in conjunction with Dr. Ashok Cordia.  Final diagnoses:  Otalgia of left ear  Cerumen impaction, left  Strain of neck muscle, initial encounter        Addison Lank, MD 03/10/15 [id number]  Lajean Saver, MD 03/10/15 2300

## 2015-04-03 ENCOUNTER — Other Ambulatory Visit: Payer: Self-pay | Admitting: Nephrology

## 2015-04-03 DIAGNOSIS — M79604 Pain in right leg: Secondary | ICD-10-CM

## 2015-04-09 ENCOUNTER — Ambulatory Visit
Admission: RE | Admit: 2015-04-09 | Discharge: 2015-04-09 | Disposition: A | Discharge status: Home / Self Care | Payer: Medicare Other | Source: Ambulatory Visit | Attending: Nephrology | Admitting: Nephrology

## 2015-04-09 DIAGNOSIS — M79604 Pain in right leg: Secondary | ICD-10-CM

## 2015-04-20 ENCOUNTER — Emergency Department (HOSPITAL_COMMUNITY): Payer: Medicare Other

## 2015-04-20 ENCOUNTER — Inpatient Hospital Stay (HOSPITAL_COMMUNITY)
Admission: EM | Admit: 2015-04-20 | Discharge: 2015-04-21 | DRG: 917 | Disposition: A | Discharge status: Home / Self Care | Payer: Medicare Other | Attending: Internal Medicine | Admitting: Internal Medicine

## 2015-04-20 ENCOUNTER — Encounter (HOSPITAL_COMMUNITY): Payer: Self-pay | Admitting: Emergency Medicine

## 2015-04-20 DIAGNOSIS — E11649 Type 2 diabetes mellitus with hypoglycemia without coma: Secondary | ICD-10-CM | POA: Diagnosis present

## 2015-04-20 DIAGNOSIS — Z9114 Patient's other noncompliance with medication regimen: Secondary | ICD-10-CM

## 2015-04-20 DIAGNOSIS — I10 Essential (primary) hypertension: Secondary | ICD-10-CM | POA: Diagnosis not present

## 2015-04-20 DIAGNOSIS — G9341 Metabolic encephalopathy: Secondary | ICD-10-CM | POA: Diagnosis present

## 2015-04-20 DIAGNOSIS — E785 Hyperlipidemia, unspecified: Secondary | ICD-10-CM | POA: Diagnosis present

## 2015-04-20 DIAGNOSIS — D631 Anemia in chronic kidney disease: Secondary | ICD-10-CM | POA: Diagnosis not present

## 2015-04-20 DIAGNOSIS — Z720 Tobacco use: Secondary | ICD-10-CM | POA: Diagnosis present

## 2015-04-20 DIAGNOSIS — Z951 Presence of aortocoronary bypass graft: Secondary | ICD-10-CM | POA: Diagnosis not present

## 2015-04-20 DIAGNOSIS — Y92019 Unspecified place in single-family (private) house as the place of occurrence of the external cause: Secondary | ICD-10-CM

## 2015-04-20 DIAGNOSIS — I4891 Unspecified atrial fibrillation: Secondary | ICD-10-CM | POA: Diagnosis present

## 2015-04-20 DIAGNOSIS — J449 Chronic obstructive pulmonary disease, unspecified: Secondary | ICD-10-CM | POA: Diagnosis not present

## 2015-04-20 DIAGNOSIS — E119 Type 2 diabetes mellitus without complications: Secondary | ICD-10-CM | POA: Diagnosis not present

## 2015-04-20 DIAGNOSIS — I252 Old myocardial infarction: Secondary | ICD-10-CM | POA: Diagnosis not present

## 2015-04-20 DIAGNOSIS — N186 End stage renal disease: Secondary | ICD-10-CM | POA: Diagnosis not present

## 2015-04-20 DIAGNOSIS — I480 Paroxysmal atrial fibrillation: Secondary | ICD-10-CM | POA: Diagnosis not present

## 2015-04-20 DIAGNOSIS — I482 Chronic atrial fibrillation: Secondary | ICD-10-CM | POA: Diagnosis not present

## 2015-04-20 DIAGNOSIS — I2510 Atherosclerotic heart disease of native coronary artery without angina pectoris: Secondary | ICD-10-CM | POA: Diagnosis not present

## 2015-04-20 DIAGNOSIS — N2581 Secondary hyperparathyroidism of renal origin: Secondary | ICD-10-CM | POA: Diagnosis not present

## 2015-04-20 DIAGNOSIS — G9340 Encephalopathy, unspecified: Secondary | ICD-10-CM

## 2015-04-20 DIAGNOSIS — T383X1A Poisoning by insulin and oral hypoglycemic [antidiabetic] drugs, accidental (unintentional), initial encounter: Principal | ICD-10-CM | POA: Diagnosis present

## 2015-04-20 DIAGNOSIS — N400 Enlarged prostate without lower urinary tract symptoms: Secondary | ICD-10-CM | POA: Diagnosis not present

## 2015-04-20 DIAGNOSIS — E162 Hypoglycemia, unspecified: Secondary | ICD-10-CM | POA: Diagnosis present

## 2015-04-20 DIAGNOSIS — I120 Hypertensive chronic kidney disease with stage 5 chronic kidney disease or end stage renal disease: Secondary | ICD-10-CM | POA: Diagnosis present

## 2015-04-20 HISTORY — DX: Unspecified atrial fibrillation: I48.91

## 2015-04-20 HISTORY — DX: Tobacco use: Z72.0

## 2015-04-20 HISTORY — DX: Type 2 diabetes mellitus without complications: E11.9

## 2015-04-20 HISTORY — DX: Presence of aortocoronary bypass graft: Z95.1

## 2015-04-20 HISTORY — DX: Non-ST elevation (NSTEMI) myocardial infarction: I21.4

## 2015-04-20 HISTORY — DX: Hypoglycemia, unspecified: E16.2

## 2015-04-20 LAB — BASIC METABOLIC PANEL
Anion gap: 15 (ref 5–15)
BUN: 55 mg/dL — ABNORMAL HIGH (ref 6–20)
CALCIUM: 9.6 mg/dL (ref 8.9–10.3)
CO2: 25 mmol/L (ref 22–32)
Chloride: 100 mmol/L — ABNORMAL LOW (ref 101–111)
Creatinine, Ser: 9.86 mg/dL — ABNORMAL HIGH (ref 0.61–1.24)
GFR calc Af Amer: 6 mL/min — ABNORMAL LOW (ref >60)
GFR calc non Af Amer: 5 mL/min — ABNORMAL LOW (ref >60)
GLUCOSE: 64 mg/dL — AB (ref 65–99)
Potassium: 3.5 mmol/L (ref 3.5–5.1)
Sodium: 140 mmol/L (ref 135–145)

## 2015-04-20 LAB — GLUCOSE, CAPILLARY
GLUCOSE-CAPILLARY: 84 mg/dL (ref 65–99)
Glucose-Capillary: 158 mg/dL — ABNORMAL HIGH (ref 65–99)

## 2015-04-20 LAB — CBC
HCT: 40.1 % (ref 39.0–52.0)
Hemoglobin: 13 g/dL (ref 13.0–17.0)
MCH: 31.8 pg (ref 26.0–34.0)
MCHC: 32.4 g/dL (ref 30.0–36.0)
MCV: 98 fL (ref 78.0–100.0)
PLATELETS: 154 10*3/uL (ref 150–400)
RBC: 4.09 MIL/uL — ABNORMAL LOW (ref 4.22–5.81)
RDW: 17.1 % — AB (ref 11.5–15.5)
WBC: 13.1 10*3/uL — AB (ref 4.0–10.5)

## 2015-04-20 LAB — TROPONIN I: Troponin I: 0.47 ng/mL — ABNORMAL HIGH (ref <0.031)

## 2015-04-20 LAB — CBG MONITORING, ED
GLUCOSE-CAPILLARY: 47 mg/dL — AB (ref 65–99)
GLUCOSE-CAPILLARY: 76 mg/dL (ref 65–99)
GLUCOSE-CAPILLARY: 35 mg/dL — AB (ref 65–99)
Glucose-Capillary: 100 mg/dL — ABNORMAL HIGH (ref 65–99)

## 2015-04-20 LAB — VITAMIN B12: Vitamin B-12: 1346 pg/mL — ABNORMAL HIGH (ref 180–914)

## 2015-04-20 LAB — CDS SEROLOGY

## 2015-04-20 MED ORDER — HYDROCODONE-ACETAMINOPHEN 5-325 MG PO TABS
1.0000 | ORAL_TABLET | ORAL | Status: DC | PRN
  Administered 2015-04-20: 2 via ORAL
  Filled 2015-04-20: qty 2

## 2015-04-20 MED ORDER — DEXTROSE-NACL 5-0.9 % IV SOLN
INTRAVENOUS | Status: DC
Start: 2015-04-20 — End: 2015-04-21
  Administered 2015-04-20: 09:00:00 via INTRAVENOUS

## 2015-04-20 MED ORDER — ACETAMINOPHEN 650 MG RE SUPP: 650.0000 mg | Freq: Four times a day (QID) | RECTAL | Status: DC | PRN

## 2015-04-20 MED ORDER — SEVELAMER CARBONATE 800 MG PO TABS
4800.0000 mg | ORAL_TABLET | Freq: Three times a day (TID) | ORAL | Status: DC
  Administered 2015-04-20 – 2015-04-21 (×2): 4800 mg via ORAL
  Filled 2015-04-20 (×3): qty 6

## 2015-04-20 MED ORDER — ONDANSETRON HCL 4 MG/2ML IJ SOLN: 4.0000 mg | Freq: Four times a day (QID) | INTRAMUSCULAR | Status: DC | PRN

## 2015-04-20 MED ORDER — SODIUM CHLORIDE 0.9 % IV SOLN: 62.5000 mg | INTRAVENOUS | Status: DC

## 2015-04-20 MED ORDER — METOPROLOL TARTRATE 25 MG PO TABS
25.0000 mg | ORAL_TABLET | Freq: Two times a day (BID) | ORAL | Status: DC
  Administered 2015-04-20: 25 mg via ORAL
  Filled 2015-04-20: qty 1

## 2015-04-20 MED ORDER — ATORVASTATIN CALCIUM 20 MG PO TABS
20.0000 mg | ORAL_TABLET | Freq: Every day | ORAL | Status: DC
  Administered 2015-04-20: 20 mg via ORAL
  Filled 2015-04-20: qty 1

## 2015-04-20 MED ORDER — DEXTROSE 50 % IV SOLN
1.0000 | Freq: Once | INTRAVENOUS | Status: AC
  Administered 2015-04-20: 50 mL via INTRAVENOUS
  Filled 2015-04-20: qty 50

## 2015-04-20 MED ORDER — ENOXAPARIN SODIUM 30 MG/0.3ML ~~LOC~~ SOLN
30.0000 mg | SUBCUTANEOUS | Status: DC
  Filled 2015-04-20: qty 0.3

## 2015-04-20 MED ORDER — SENNOSIDES-DOCUSATE SODIUM 8.6-50 MG PO TABS
1.0000 | ORAL_TABLET | Freq: Every evening | ORAL | Status: DC | PRN
  Filled 2015-04-20: qty 1

## 2015-04-20 MED ORDER — ONDANSETRON HCL 4 MG PO TABS: 4.0000 mg | ORAL_TABLET | Freq: Four times a day (QID) | ORAL | Status: DC | PRN

## 2015-04-20 MED ORDER — ALBUTEROL SULFATE (2.5 MG/3ML) 0.083% IN NEBU: 3.0000 mL | INHALATION_SOLUTION | Freq: Four times a day (QID) | RESPIRATORY_TRACT | Status: DC | PRN

## 2015-04-20 MED ORDER — DEXTROSE 50 % IV SOLN
1.0000 | Freq: Once | INTRAVENOUS | Status: AC
  Administered 2015-04-20: 50 mL via INTRAVENOUS

## 2015-04-20 MED ORDER — NEPRO/CARBSTEADY PO LIQD
237.0000 mL | Freq: Two times a day (BID) | ORAL | Status: DC
  Administered 2015-04-21: 237 mL via ORAL

## 2015-04-20 MED ORDER — ACETAMINOPHEN 325 MG PO TABS: 650.0000 mg | ORAL_TABLET | Freq: Four times a day (QID) | ORAL | Status: DC | PRN

## 2015-04-20 MED ORDER — TAMSULOSIN HCL 0.4 MG PO CAPS
0.4000 mg | ORAL_CAPSULE | Freq: Every day | ORAL | Status: DC
  Administered 2015-04-20: 0.4 mg via ORAL
  Filled 2015-04-20: qty 1

## 2015-04-20 MED ORDER — DEXTROSE 50 % IV SOLN: 1.0000 | Freq: Once | INTRAVENOUS | Status: DC

## 2015-04-20 MED ORDER — RENA-VITE PO TABS
1.0000 | ORAL_TABLET | Freq: Every day | ORAL | Status: DC
  Administered 2015-04-20: 1 via ORAL
  Filled 2015-04-20: qty 1

## 2015-04-20 MED ORDER — ZOLPIDEM TARTRATE 5 MG PO TABS: 5.0000 mg | ORAL_TABLET | Freq: Every evening | ORAL | Status: DC | PRN

## 2015-04-20 MED ORDER — PANTOPRAZOLE SODIUM 40 MG PO TBEC
40.0000 mg | DELAYED_RELEASE_TABLET | Freq: Every day | ORAL | Status: DC
  Administered 2015-04-20 – 2015-04-21 (×2): 40 mg via ORAL
  Filled 2015-04-20 (×2): qty 1

## 2015-04-20 MED ORDER — INSULIN ASPART 100 UNIT/ML ~~LOC~~ SOLN
0.0000 [IU] | Freq: Three times a day (TID) | SUBCUTANEOUS | Status: DC
  Administered 2015-04-20: 2 [IU] via SUBCUTANEOUS

## 2015-04-20 MED ORDER — DEXTROSE 50 % IV SOLN
INTRAVENOUS | Status: AC
Start: 2015-04-20 — End: 2015-04-20
  Filled 2015-04-20: qty 50

## 2015-04-20 NOTE — Procedures (Signed)
Hemodialysis orders cancelled for 04/20/15 by Dr. Jeneen Rinks [place] due to admitting status of Observation.

## 2015-04-20 NOTE — ED Notes (Signed)
CBG 47 upon arrival to ED. Will give D50

## 2015-04-20 NOTE — ED Notes (Addendum)
Pt turned over to grab TV and accidentally pulled TV off wall. TV struck him in the head. Pt's wife reported to EMS that his speech was slurred and pt was walking "funny." EMS arrived and pt had unequal pupils, slurred speech and CBG 32. EMS gave amp D50, CBG came up to 193. Pt became more alert and oriented. Pupils equil reactive.

## 2015-04-20 NOTE — ED Notes (Signed)
Gave pt [place] sandwich, crackers and drink per verbal order from MD

## 2015-04-20 NOTE — ED Notes (Signed)
BREAKFAST ORDERED  

## 2015-04-20 NOTE — H&P (Signed)
[facility] History and Physical  Sujan Bazer [id number] DOB: 08/03/1945 DOA: 04/20/2015  Referring physician: Venora Maples PCP: No primary care provider on file. [email or web address]  Chief Complaint: ams  HPI: [name] [name] is a 70 y.o. male with past medical history that includes hypertension, diabetes, A. Fib, end-stage renal disease a Monday Wednesday Friday dialysis patient, presents to the emergency department via EMS with a chief complaint altered mental status. Initial evaluation reveals hypoglycemia  Information is obtained from the patient and significant other who is at the bedside. Family member reports patient pulled TV onto his head 2 days ago and since then has been "walking funny" and they believe his speech is somewhat slurred. They called EMS today who reports upon arrival blood sugar was 32.  Patient denies any memory of circumstances which led him to be brought to the emergency department. He does report recent "head cold" and reports some chills but denies any fever. He denies headache visual disturbances syncope or near-syncope. He denies chest pain palpitation shortness of breath cough abdominal pain nausea vomiting diarrhea. He states he does report making "a little" urine. He reports eating his normal amount over the last several days including dinner last night. He had not had breakfast. He is unsure if any changes in his medications. Family denies any seizure like activity.  In the emergency department he was treated as a level II trauma for head injury but his mental status improved after treatment of his hypoglycemia.  In the emergency department he is afebrile hemodynamically stable and not hypoxic.  He is provided with his second amp of D50, food and a D5 drip was started.  Review of Systems:  10 point review of systems complete and all systems are negative except as indicated in the history of present illness  Past Medical History  Diagnosis Date  . Diabetes mellitus  without complication ([place])   . Hypertension   . A-fib ([place])   . Hypoglycemia   . Tobacco use   . ESRD (end stage renal disease) ([place])     dialysis   History reviewed. No pertinent past surgical history. Social History:  reports that he has been smoking.  He does not have any smokeless tobacco history on file. He reports that he does not drink alcohol. His drug history is not on file. He lives at home alone he has a significant other who lives in the same building he smokes and denies alcohol does not need a cane or walker is fairly independent with ADLs No Known Allergies  History reviewed. No pertinent family history. Family medical history is reviewed with patient has no recollection of any of his family members or their medical history  Prior to Admission medications   Not on File   Physical Exam: Filed Vitals:   04/20/15 0723 04/20/15 0735 04/20/15 0800 04/20/15 0915  BP: 100/60  116/78 117/59  Pulse:   103 88  Temp: 97.4 F (36.3 C)     TempSrc: Oral     Resp: 15  28 12   Height:  5\' 7"  (1.702 m)    Weight:  75.297 kg (166 lb)    SpO2: 94%  100% 98%    Wt Readings from Last 3 Encounters:  04/20/15 75.297 kg (166 lb)    General:  Appears calm and comfortable, somewhat irritable Eyes: PERRL, normal lids, irises & conjunctiva ENT: grossly normal hearing, lips & tongue, mucous membranes of his mouth are moist and pink very poor dentition Neck: no LAD, masses  or thyromegaly Cardiovascular: Irregularly irregular, no m/r/g. No LE edema. Left upper arm with dialysis fistula  Respiratory: CTA bilaterally, no w/r/r. Normal respiratory effort. Abdomen: soft, ntnd is a bowel sounds no guarding or rebounding Skin: no rash or induration seen on limited exam Musculoskeletal: grossly normal tone BUE/BLE Psychiatric: grossly normal mood and affect, speech fluent and appropriate Neurologic: grossly non-focal. Clear facial symmetry           Labs on Admission:  Basic Metabolic  Panel:  Recent Labs Lab 04/20/15 0723  NA 140  K 3.5  CL 100*  CO2 25  GLUCOSE 64*  BUN 55*  CREATININE 9.86*  CALCIUM 9.6   Liver Function Tests: No results for input(s): AST, ALT, ALKPHOS, BILITOT, PROT, ALBUMIN in the last 168 hours. No results for input(s): LIPASE, AMYLASE in the last 168 hours. No results for input(s): AMMONIA in the last 168 hours. CBC:  Recent Labs Lab 04/20/15 0723  WBC 13.1*  HGB 13.0  HCT 40.1  MCV 98.0  PLT 154   Cardiac Enzymes: No results for input(s): CKTOTAL, CKMB, CKMBINDEX, TROPONINI in the last 168 hours.  BNP (last 3 results) No results for input(s): BNP in the last 8760 hours.  ProBNP (last 3 results) No results for input(s): PROBNP in the last 8760 hours.  CBG:  Recent Labs Lab 04/20/15 0715 04/20/15 0753 04/20/15 0853  GLUCAP 47* 76 35*    Radiological Exams on Admission: Dg Chest 2 View  04/20/2015  CLINICAL DATA:  Head injury, confusion, hypoglycemia EXAM: CHEST  2 VIEW COMPARISON:  None. FINDINGS: Cardiomegaly is noted. Status post CABG. No acute infiltrate or pleural effusion. No pulmonary edema. Bony thorax is unremarkable. IMPRESSION: No active cardiopulmonary disease.  Status post CABG. Electronically Signed   By: Lahoma Crocker M.D.   On: 04/20/2015 08:28   Ct Head Wo Contrast  04/20/2015  CLINICAL DATA:  TV fell on top of head.  Confusion. EXAM: CT HEAD WITHOUT CONTRAST TECHNIQUE: Contiguous axial images were obtained from the base of the skull through the vertex without intravenous contrast. COMPARISON:  None. FINDINGS: No acute intracranial abnormality. Specifically, no hemorrhage, hydrocephalus, mass lesion, acute infarction, or significant intracranial injury. No acute calvarial abnormality. Mucosal thickening and air-fluid level within the left maxillary sinus. Mucosal thickening is within scattered ethmoid air cells. Mastoid air cells are clear. IMPRESSION: No acute intracranial abnormality. Acute on chronic  sinusitis. Electronically Signed   By: Rolm Baptise M.D.   On: 04/20/2015 07:55    EKG: Independently Atrial flutter with predominant 3:1 AV block Right axis deviation Consider left ventricular hypertrophy Probable lateral infarct, age indeterminate  Assessment/Plan Principal Problem:   Acute encephalopathy Active Problems:   Hypoglycemia   Diabetes mellitus without complication (HCC)   Hypertension   A-fib (HCC)   Tobacco use   ESRD (end stage renal disease) ([place])  1. Acute encephalopathy. Resolved at time of admission. Likely related to hypoglycemia. CT of the head no acute intracranial abnormality. He is afebrile mild leukocytosis hemodynamically stable.  -Admit to medical floor -Continue D5 intravenously -regular diet -CBG QID -Hold home medications -Obtain a B-12 and folate -cycle troponin  #2. Hypoglycemia. Patient reports he takes insulin at night is unsure of any other medications. He lives alone and I believe is at risk for medication error. Reports implants with diet -Continue D5 drip -Monitor CBG -wean D5 drip as indicated  #3. Diabetes. Home regimen unknown at this time. Does report insulin at night. -We'll obtain a hemoglobin  A1c -Provide regular diet for now -Monitor CBGs and use sensitive sliding scale as indicated  #4. Hypertension. Controlled in the emergency department. Home medications currently unknown -Monitor -Review home meds and resume as indicated  #5. A. Fib. Not on anticoagulation unclear as to why. Chadvasc score 3. EKG with aflutter.  -continue home meds once verified.   #6. End-stage renal disease. Patient is a Monday Wednesday Friday dialysis patient. Reports compliance. creatinine 9.86 on admission. -Nephrology consulted per ED M.D. -Dialysis per nephrology  7. Tobacco use. -Cessation counseling offered   Code Status: full DVT Prophylaxis: Family Communication: significant other at bedside Disposition Plan: home in 24-36  hours  Time spent: 27 minutes  [facility]

## 2015-04-20 NOTE — ED Notes (Signed)
Notified MD of pt's CBG 35 after eating

## 2015-04-20 NOTE — Progress Notes (Signed)
Orthopedic Tech Progress Note Patient Details:  [name] [name] 17-Aug-1945 [id number] Made level 2 trauma visit Patient ID: [name] [name], male   DOB: Nov 05, 1945, 70 y.o.   MRN: [id number]   [name] [name] 04/20/2015, 7:21 AM

## 2015-04-20 NOTE — ED Notes (Signed)
Trauma downgraded. No longer trauma

## 2015-04-20 NOTE — Consult Note (Signed)
Reason for Consult: ESRD   Referring Physician: Dr. Alphonsa Gin [name] [name] is an 70 y.o. male.  HPI: 70 yr male with ESRD secondary to DM, Hx prostate Ca, Hx CAD post CABG, COPD with smoking on going, anemia, HPTH, afib in past.  Admitted now with refractory hypoglycemia.  He does not remember details other than he fell.  Recent wgt loss, D. Put on Megace and Imodium and now appetite ^ and D resolved.  Says he ate well yest.  Family notes recently poor intake.  Only on Glipizide 2.5 mg for his DM.  Had been shortening tx with D, but not last 2 tx.   Constitutional: as above Eyes: negative Ears, nose, mouth, throat, and face: negative Respiratory: smoking, wheezing. Cardiovascular: negative Gastrointestinal: as above Genitourinary:negative Integument/breast: negative Musculoskeletal:some arth esp shoulders, knees Neurological: as above Endocrine: DM,. HPTH Allergic/Immunologic: negative   Dialyzes at [facility] on MWF since 66yr Primary Nephrologist Meribeth Vitug. EDW 68 kg. HD Bath 2 Ca, 2 K, Dialyzer 180 NR, Heparin Strd1. Access LUA AVF.  Past Medical History  Diagnosis Date  . Diabetes mellitus without complication ([place]   . Hypertension   . A-fib ([place]   . Hypoglycemia   . Tobacco use   . ESRD (end stage renal disease) ([place]     dialysis  . NSTEMI (non-ST elevated myocardial infarction) ([place]   . S/P CABG x 5     History reviewed. No pertinent past surgical history.  History reviewed. No pertinent family history.  Social History:  reports that he has been smoking.  He does not have any smokeless tobacco history on file. He reports that he does not drink alcohol. His drug history is not on file.  Allergies: No Known Allergies  Medications:  I have reviewed the patient's current medications. Prior to Admission:  Prescriptions prior to admission  Medication Sig Dispense Refill Last Dose  . albuterol (PROVENTIL HFA;VENTOLIN HFA) 108 (90 Base) MCG/ACT inhaler Inhale 1-2 puffs  into the lungs every 6 (six) hours as needed for wheezing or shortness of breath.   04/18/2015  . atorvastatin (LIPITOR) 20 MG tablet Take 20 mg by mouth daily.   04/18/2015  . glimepiride (AMARYL) 2 MG tablet Take 2 mg by mouth daily with breakfast.   04/18/2015  . megestrol (MEGACE) 40 MG/ML suspension Take 400 mg by mouth daily.   04/18/2015  . metoprolol tartrate (LOPRESSOR) 25 MG tablet Take 25 mg by mouth 2 (two) times daily.   04/18/2015 at 1800  . omeprazole (PRILOSEC) 20 MG capsule Take 20 mg by mouth daily.   04/18/2015  . tamsulosin (FLOMAX) 0.4 MG CAPS capsule Take 0.4 mg by mouth at bedtime.   04/18/2015      Results for orders placed or performed during the hospital encounter of 04/20/15 (from the past 48 hour(s))  CBG monitoring, ED     Status: Abnormal   Collection Time: 04/20/15  7:15 AM  Result Value Ref Range   Glucose-Capillary 47 (L) 65 - 99 mg/dL  CBC     Status: Abnormal   Collection Time: 04/20/15  7:23 AM  Result Value Ref Range   WBC 13.1 (H) 4.0 - 10.5 K/uL   RBC 4.09 (L) 4.22 - 5.81 MIL/uL   Hemoglobin 13.0 13.0 - 17.0 g/dL   HCT 40.1 39.0 - 52.0 %   MCV 98.0 78.0 - 100.0 fL   MCH 31.8 26.0 - 34.0 pg   MCHC 32.4 30.0 - 36.0 g/dL   RDW 17.1 (H)  11.5 - 15.5 %   Platelets 154 150 - 400 K/uL  Basic metabolic panel     Status: Abnormal   Collection Time: 04/20/15  7:23 AM  Result Value Ref Range   Sodium 140 135 - 145 mmol/L   Potassium 3.5 3.5 - 5.1 mmol/L   Chloride 100 (L) 101 - 111 mmol/L   CO2 25 22 - 32 mmol/L   Glucose, Bld 64 (L) 65 - 99 mg/dL   BUN 55 (H) 6 - 20 mg/dL   Creatinine, Ser 9.86 (H) 0.61 - 1.24 mg/dL   Calcium 9.6 8.9 - 10.3 mg/dL   GFR calc non Af Amer 5 (L) >60 mL/min   GFR calc Af Amer 6 (L) >60 mL/min    Comment: (NOTE) The eGFR has been calculated using the CKD EPI equation. This calculation has not been validated in all clinical situations. eGFR's persistently <60 mL/min signify possible Chronic Kidney Disease.    Anion gap 15 5 - 15   Vitamin B12     Status: Abnormal   Collection Time: 04/20/15  7:23 AM  Result Value Ref Range   Vitamin B-12 1346 (H) 180 - 914 pg/mL    Comment: (NOTE) This assay is not validated for testing neonatal or myeloproliferative syndrome specimens for Vitamin B12 levels.   CBG monitoring, ED     Status: None   Collection Time: 04/20/15  7:53 AM  Result Value Ref Range   Glucose-Capillary 76 65 - 99 mg/dL  CBG monitoring, ED     Status: Abnormal   Collection Time: 04/20/15  8:53 AM  Result Value Ref Range   Glucose-Capillary 35 (LL) 65 - 99 mg/dL   Comment 1 Notify RN    Comment 2 Document in Chart   Troponin I     Status: Abnormal   Collection Time: 04/20/15 10:04 AM  Result Value Ref Range   Troponin I 0.47 (H) <0.031 ng/mL    Comment:        PERSISTENTLY INCREASED TROPONIN VALUES IN THE RANGE OF 0.04-0.49 ng/mL CAN BE SEEN IN:       -UNSTABLE ANGINA       -CONGESTIVE HEART FAILURE       -MYOCARDITIS       -CHEST TRAUMA       -ARRYHTHMIAS       -LATE PRESENTING MYOCARDIAL INFARCTION       -COPD   CLINICAL FOLLOW-UP RECOMMENDED.   CBG monitoring, ED     Status: Abnormal   Collection Time: 04/20/15 10:10 AM  Result Value Ref Range   Glucose-Capillary 100 (H) 65 - 99 mg/dL    Dg Chest 2 View  04/20/2015  CLINICAL DATA:  Head injury, confusion, hypoglycemia EXAM: CHEST  2 VIEW COMPARISON:  None. FINDINGS: Cardiomegaly is noted. Status post CABG. No acute infiltrate or pleural effusion. No pulmonary edema. Bony thorax is unremarkable. IMPRESSION: No active cardiopulmonary disease.  Status post CABG. Electronically Signed   By: Lahoma Crocker M.D.   On: 04/20/2015 08:28   Ct Head Wo Contrast  04/20/2015  CLINICAL DATA:  TV fell on top of head.  Confusion. EXAM: CT HEAD WITHOUT CONTRAST TECHNIQUE: Contiguous axial images were obtained from the base of the skull through the vertex without intravenous contrast. COMPARISON:  None. FINDINGS: No acute intracranial abnormality.  Specifically, no hemorrhage, hydrocephalus, mass lesion, acute infarction, or significant intracranial injury. No acute calvarial abnormality. Mucosal thickening and air-fluid level within the left maxillary sinus. Mucosal thickening is within scattered  ethmoid air cells. Mastoid air cells are clear. IMPRESSION: No acute intracranial abnormality. Acute on chronic sinusitis. Electronically Signed   By: Rolm Baptise M.D.   On: 04/20/2015 07:55    Review of Systems  Eyes: Positive for photophobia.   Blood pressure 117/62, pulse 88, temperature 98 F (36.7 C), temperature source Oral, resp. rate 16, height 5' 7"  (1.702 m), weight 71.9 kg (158 lb 8.2 oz), SpO2 93 %. Physical Exam Physical Examination: General appearance - slowed mentation, but oriented  Mental status - alert, oriented to person, place, and time Eyes - pupils equal and reactive, extraocular eye movements intact, DM changes Mouth - mucous membranes moist, pharynx normal without lesions Neck - adenopathy noted PCL Lymphatics - posterior cervical nodes Chest - expir wheezes, scattered rhonchi Heart - S1 and S2 normal, systolic murmur TI1/5 at 2nd left intercostal space Abdomen - soft, pos bs , liver down 5 cm Extremities - peripheral pulses normal, no pedal edema, no clubbing or cyanosis, no pedal edema noted AVF LUA Skin - some briusing, few punctate lesions onlegs  Assessment/Plan: 1 Hypoglycemia ?? Poor intake and OHA, keep of that.  Not comfortable that is all the issue.   2 ESRD: HD will hold off with OBS status.  If admit , HD 3 Hypertension: mild , better with lower wgt recently 4. Anemia of ESRD: stable off ESA 5. Metabolic Bone Disease: on Cinnacalcet off Vit D 6 recent D, FTT,  etio not clear. P control BS, d/c to [facility] for HD if stable  Darika Ildefonso L 04/20/2015, 12:31 PM

## 2015-04-20 NOTE — ED Provider Notes (Signed)
CSN: [id number]     Arrival date & time 04/20/15  [id number] History   First MD Initiated Contact with Patient 04/20/15 [phone]     Chief Complaint  Patient presents with  . Trauma    HPI Patient has a history of diabetes, A. fib, hypertension, end-stage renal disease who presents to emergency department after noted by family to pull a TV onto his head.  Family reports that he had slurred speech and was "walking funny" and therefore EMS was called.  On EMS arrival the patient's blood sugar was 32 and amp of D50 was given with improvement in the patient's symptoms.  He is not on anticoagulants.  He is scheduled for dialysis today.  He denies pain in his head and neck of this time.  He denies chest pain or shortness of breath.  Denies abdominal pain.  No recent nausea vomiting or diarrhea.  He is on Glucotrol XL which he takes at nighttime.  He states his normal meal last night.  He has not had breakfast this morning.  No recent change in his dosages her medications for his diabetes.  Presents to the emergency department as a level II trauma for head injury and altered mental status.  Mental status changes resolved after treatment of his blood sugar per EMS   Past Medical History  Diagnosis Date  . Diabetes mellitus without complication ([place])   . Hypertension   . A-fib [facility])    History reviewed. No pertinent past surgical history. History reviewed. No pertinent family history. Social History  Substance Use Topics  . Smoking status: Current Every Day Smoker  . Smokeless tobacco: None  . Alcohol Use: No    Review of Systems  All other systems reviewed and are negative.     Allergies  Review of patient's allergies indicates no known allergies.  Home Medications   Prior to Admission medications   Not on File   BP 116/78 mmHg  Pulse 103  Temp(Src) 97.4 F (36.3 C) (Oral)  Resp 28  Ht 5\' 7"  (1.702 m)  Wt 166 lb (75.297 kg)  BMI 25.99 kg/m2  SpO2 100% Physical Exam  Constitutional: He  is oriented to person, place, and time. He appears well-developed and well-nourished.  HENT:  Head: Normocephalic and atraumatic.  Eyes: EOM are normal.  Neck: Normal range of motion.  Cardiovascular: Normal rate, regular rhythm, normal heart sounds and intact distal pulses.   Pulmonary/Chest: Effort normal and breath sounds normal. No respiratory distress.  Abdominal: Soft. He exhibits no distension. There is no tenderness.  Musculoskeletal: Normal range of motion.  Fistula left upper arm with good thrill.  Normal strength in bilateral upper lower extremity major muscle groups  Neurological: He is alert and oriented to person, place, and time.  Skin: Skin is warm and dry.  Psychiatric: He has a normal mood and affect. Judgment normal.  Nursing note and vitals reviewed.   ED Course  Procedures (including critical care time) Labs Review Labs Reviewed  CBC - Abnormal; Notable for the following:    WBC 13.1 (*)    RBC 4.09 (*)    RDW 17.1 (*)    All other components within normal limits  BASIC METABOLIC PANEL - Abnormal; Notable for the following:    Chloride 100 (*)    Glucose, Bld 64 (*)    BUN 55 (*)    Creatinine, Ser 9.86 (*)    GFR calc non Af Amer 5 (*)    GFR calc Af  Amer 6 (*)    All other components within normal limits  CBG MONITORING, ED - Abnormal; Notable for the following:    Glucose-Capillary 47 (*)    All other components within normal limits  CDS SEROLOGY  CBG MONITORING, ED    Imaging Review Dg Chest 2 View  04/20/2015  CLINICAL DATA:  Head injury, confusion, hypoglycemia EXAM: CHEST  2 VIEW COMPARISON:  None. FINDINGS: Cardiomegaly is noted. Status post CABG. No acute infiltrate or pleural effusion. No pulmonary edema. Bony thorax is unremarkable. IMPRESSION: No active cardiopulmonary disease.  Status post CABG. Electronically Signed   By: Lahoma Crocker M.D.   On: 04/20/2015 08:28   Ct Head Wo Contrast  04/20/2015  CLINICAL DATA:  TV fell on top of head.   Confusion. EXAM: CT HEAD WITHOUT CONTRAST TECHNIQUE: Contiguous axial images were obtained from the base of the skull through the vertex without intravenous contrast. COMPARISON:  None. FINDINGS: No acute intracranial abnormality. Specifically, no hemorrhage, hydrocephalus, mass lesion, acute infarction, or significant intracranial injury. No acute calvarial abnormality. Mucosal thickening and air-fluid level within the left maxillary sinus. Mucosal thickening is within scattered ethmoid air cells. Mastoid air cells are clear. IMPRESSION: No acute intracranial abnormality. Acute on chronic sinusitis. Electronically Signed   By: Rolm Baptise M.D.   On: 04/20/2015 07:55   I have personally reviewed and evaluated these images and lab results as part of my medical decision-making.   EKG Interpretation None      MDM   Final diagnoses:  Hypoglycemia     Persistent hypoglycemia despite food in dextrose.  Patient be started on D5 drip and admitted to the hospital.  Jola Schmidt, MD 04/20/15 [phone]

## 2015-04-21 ENCOUNTER — Encounter (HOSPITAL_COMMUNITY): Payer: Self-pay | Admitting: Emergency Medicine

## 2015-04-21 DIAGNOSIS — E11649 Type 2 diabetes mellitus with hypoglycemia without coma: Secondary | ICD-10-CM | POA: Diagnosis not present

## 2015-04-21 DIAGNOSIS — T383X1A Poisoning by insulin and oral hypoglycemic [antidiabetic] drugs, accidental (unintentional), initial encounter: Secondary | ICD-10-CM | POA: Diagnosis not present

## 2015-04-21 DIAGNOSIS — G9340 Encephalopathy, unspecified: Secondary | ICD-10-CM | POA: Diagnosis not present

## 2015-04-21 LAB — BASIC METABOLIC PANEL
ANION GAP: 15 (ref 5–15)
BUN: 67 mg/dL — ABNORMAL HIGH (ref 6–20)
CHLORIDE: 97 mmol/L — AB (ref 101–111)
CO2: 26 mmol/L (ref 22–32)
Calcium: 9.6 mg/dL (ref 8.9–10.3)
Creatinine, Ser: 11.15 mg/dL — ABNORMAL HIGH (ref 0.61–1.24)
GFR calc non Af Amer: 4 mL/min — ABNORMAL LOW (ref >60)
GFR, EST AFRICAN AMERICAN: 5 mL/min — AB (ref >60)
Glucose, Bld: 86 mg/dL (ref 65–99)
POTASSIUM: 4.5 mmol/L (ref 3.5–5.1)
SODIUM: 138 mmol/L (ref 135–145)

## 2015-04-21 LAB — CBC
HEMATOCRIT: 36.2 % — AB (ref 39.0–52.0)
Hemoglobin: 11.9 g/dL — ABNORMAL LOW (ref 13.0–17.0)
MCH: 31.2 pg (ref 26.0–34.0)
MCHC: 32.9 g/dL (ref 30.0–36.0)
MCV: 94.8 fL (ref 78.0–100.0)
Platelets: 155 10*3/uL (ref 150–400)
RBC: 3.82 MIL/uL — AB (ref 4.22–5.81)
RDW: 17 % — ABNORMAL HIGH (ref 11.5–15.5)
WBC: 10.2 10*3/uL (ref 4.0–10.5)

## 2015-04-21 LAB — GLUCOSE, CAPILLARY: Glucose-Capillary: 81 mg/dL (ref 65–99)

## 2015-04-21 LAB — HEMOGLOBIN A1C
HEMOGLOBIN A1C: 5.8 % — AB (ref 4.8–5.6)
MEAN PLASMA GLUCOSE: 120 mg/dL

## 2015-04-21 MED ORDER — GLIMEPIRIDE 1 MG PO TABS: 1.0000 mg | ORAL_TABLET | Freq: Every day | ORAL | Status: DC

## 2015-04-21 NOTE — Discharge Summary (Addendum)
[name] [name], is a 70 y.o. male  DOB 11-18-1945  MRN [id number].  Admission date:  04/20/2015  Admitting Physician  Waldemar Dickens, MD  Discharge Date:  04/21/2015   Primary MD  No primary care provider on file.  Recommendations for primary care physician for things to follow:   Follow glycemic control closely, patient noncompliant with his diet and Accu-Cheks. If more episodes of hypoglycemia stop Amaryl likely good candidate for sliding scale only.   Admission Diagnosis  Diabetes mellitus without complication (HCC) A999333 Paroxysmal atrial fibrillation (HCC) [I48.0] Hypoglycemia [E16.2] ESRD (end stage renal disease) ([place]) [N18.6] Tobacco use [Z72.0] Essential hypertension [I10]   Discharge Diagnosis  Diabetes mellitus without complication ([place]) A999333 Paroxysmal atrial fibrillation (HCC) [I48.0] Hypoglycemia [E16.2] ESRD (end stage renal disease) ([place]) [N18.6] Tobacco use [Z72.0] Essential hypertension [I10]     Principal Problem:   Acute encephalopathy Active Problems:   Hypoglycemia   Diabetes mellitus without complication (HCC)   Hypertension   A-fib (HCC)   Tobacco use   ESRD (end stage renal disease) ([place])      Past Medical History  Diagnosis Date  . Diabetes mellitus without complication ([place])   . Hypertension   . A-fib ([place])   . Hypoglycemia   . Tobacco use   . ESRD (end stage renal disease) ([place])     dialysis  . NSTEMI (non-ST elevated myocardial infarction) ([place])   . S/P CABG x 5     History reviewed. No pertinent past surgical history.     HPI  from the history and physical done on the day of admission:    [name] [name] is a 70 y.o. male with a Past Medical History of DM, ESRD, Afib, NSTEMI s/p CABGx5, HTN who presents with AMS and hypoglycemia.  AMS likely from  hypoglycemia as resolved. No evidence of infectious etiology, szr, or significant metabolic source. BUN elevated but not remarkably as on dialysis at baseline. Hypoglycemia likely from insulin overdose in ESRD pt. Resolved w/ D5 IVF. Dialysis later today.   Hospital Course:     1.Acute encephalopathy metabolic due to hypoglycemia. Patient is diabetic and takes Amaryl, missed couple of his meals and also not very compliant with doing Accu-Cheks, A1c was 5.8, He informed the staff that he was not taking Amaryl 4 months and then took it for the first time in the last 2-3 days, at this point since his A1c is so low we'll stop his Amaryl altogether, he appears to be severely noncompliant. He did talk to the diabetic educator, in the future if needed I think he should be simply placed on sliding scale insulin. Have asked him to continue doing Accu-Cheks every before meals at bedtime and to present it to his PCP next visit.  2. DM type II. Plan as above.  3. Chronic atrial fibrillation. [place] vasc 2 score of 3. He will follow with his primary cardiologist, he was not on anticoagulation and I would suspect due to noncompliance, we will defer this to his PCP and  primary cardiologist. Continue beta blocker.  4. Dyslipidemia. Home dose statin continued.  5. ESRD. Discussed with Dr. Jimmy Footman nephrology, he will be discharged and he will go to his dialysis unit directly from here.  6. Smoking. Counseled to quit.  7. Essential hypertension. Continue beta blocker at home dose unchanged.  8. BPH. On Flomax continue.    Follow UP  Follow-up Information    Follow up with PCP. Schedule an appointment as soon as possible for a visit in 2 days.       Consults obtained - renal  Discharge Condition:Fair  Diet and Activity recommendation: See Discharge Instructions below  Discharge Instructions           Discharge Instructions    Discharge instructions    Complete by:  As directed   Follow with  Primary MD in 2-3 days   Get CBC, CMP, 2 view Chest X ray checked  by Primary MD next visit.    Activity: As tolerated with Full fall precautions use walker/cane & assistance as needed   Disposition Home    Diet:  Renal - low Carb  Check your Weight same time everyday, if you gain over 2 pounds, or you develop in leg swelling, experience more shortness of breath or chest pain, call your Primary MD immediately. Follow Cardiac Low Salt Diet and 1.2 lit/day fluid restriction.  Accuchecks 4 times/day, Once in AM empty stomach and then before each meal. Log in all results and show them to your Prim.MD in 3 days. If any glucose reading is under 80 or above 300 call your Prim MD immidiately. Follow Low glucose instructions for glucose under 80 as instructed.  On your next visit with your primary care physician please Get Medicines reviewed and adjusted.   Please request your Prim.MD to go over all Hospital Tests and Procedure/Radiological results at the follow up, please get all Hospital records sent to your Prim MD by signing hospital release before you go home.   If you experience worsening of your admission symptoms, develop shortness of breath, life threatening emergency, suicidal or homicidal thoughts you must seek medical attention immediately by calling 911 or calling your MD immediately  if symptoms less severe.  You Must read complete instructions/literature along with all the possible adverse reactions/side effects for all the Medicines you take and that have been prescribed to you. Take any new Medicines after you have completely understood and accpet all the possible adverse reactions/side effects.   Do not drive, operating heavy machinery, perform activities at heights, swimming or participation in water activities or provide baby sitting services if your were admitted for syncope or siezures until you have seen by Primary MD or a Neurologist and advised to do so again.  Do not  drive when taking Pain medications.    Do not take more than prescribed Pain, Sleep and Anxiety Medications  Special Instructions: If you have smoked or chewed Tobacco  in the last 2 yrs please stop smoking, stop any regular Alcohol  and or any Recreational drug use.  Wear Seat belts while driving.   Please note  You were cared for by a hospitalist during your hospital stay. If you have any questions about your discharge medications or the care you received while you were in the hospital after you are discharged, you can call the unit and asked to speak with the hospitalist on call if the hospitalist that took care of you is not available. Once you are discharged, your primary  care physician will handle any further medical issues. Please note that NO REFILLS for any discharge medications will be authorized once you are discharged, as it is imperative that you return to your primary care physician (or establish a relationship with a primary care physician if you do not have one) for your aftercare needs so that they can reassess your need for medications and monitor your lab values.     Increase activity slowly    Complete by:  As directed              Discharge Medications       Medication List    STOP taking these medications        glimepiride 2 MG tablet  Commonly known as:  AMARYL      TAKE these medications        albuterol 108 (90 Base) MCG/ACT inhaler  Commonly known as:  PROVENTIL HFA;VENTOLIN HFA  Inhale 1-2 puffs into the lungs every 6 (six) hours as needed for wheezing or shortness of breath.     atorvastatin 20 MG tablet  Commonly known as:  LIPITOR  Take 20 mg by mouth daily.     megestrol 40 MG/ML suspension  Commonly known as:  MEGACE  Take 400 mg by mouth daily.     metoprolol tartrate 25 MG tablet  Commonly known as:  LOPRESSOR  Take 25 mg by mouth 2 (two) times daily.     omeprazole 20 MG capsule  Commonly known as:  PRILOSEC  Take 20 mg by mouth  daily.     tamsulosin 0.4 MG Caps capsule  Commonly known as:  FLOMAX  Take 0.4 mg by mouth at bedtime.        Major procedures and Radiology Reports - PLEASE review detailed and final reports for all details, in brief -       Dg Chest 2 View  04/20/2015  CLINICAL DATA:  Head injury, confusion, hypoglycemia EXAM: CHEST  2 VIEW COMPARISON:  None. FINDINGS: Cardiomegaly is noted. Status post CABG. No acute infiltrate or pleural effusion. No pulmonary edema. Bony thorax is unremarkable. IMPRESSION: No active cardiopulmonary disease.  Status post CABG. Electronically Signed   By: Lahoma Crocker M.D.   On: 04/20/2015 08:28   Ct Head Wo Contrast  04/20/2015  CLINICAL DATA:  TV fell on top of head.  Confusion. EXAM: CT HEAD WITHOUT CONTRAST TECHNIQUE: Contiguous axial images were obtained from the base of the skull through the vertex without intravenous contrast. COMPARISON:  None. FINDINGS: No acute intracranial abnormality. Specifically, no hemorrhage, hydrocephalus, mass lesion, acute infarction, or significant intracranial injury. No acute calvarial abnormality. Mucosal thickening and air-fluid level within the left maxillary sinus. Mucosal thickening is within scattered ethmoid air cells. Mastoid air cells are clear. IMPRESSION: No acute intracranial abnormality. Acute on chronic sinusitis. Electronically Signed   By: Rolm Baptise M.D.   On: 04/20/2015 07:55    Micro Results      No results found for this or any previous visit (from the past 240 hour(s)).  Today   Subjective    [name] [name] today has no headache,no chest abdominal pain,no new weakness tingling or numbness, feels much better wants to go home today.     Objective   Blood pressure 90/59, pulse 78, temperature 97.5 F (36.4 C), temperature source Oral, resp. rate 18, height 5\' 7"  (1.702 m), weight 71.8 kg (158 lb 4.6 oz), SpO2 100 %.   Intake/Output Summary (Last 24 hours) at  04/21/15 1054 Last data filed at 04/21/15  0956  Gross per 24 hour  Intake 1085.33 ml  Output      0 ml  Net 1085.33 ml    Exam Awake Alert, Oriented x 3, No new F.N deficits, Normal affect .AT,PERRAL Supple Neck,No JVD, No cervical lymphadenopathy appriciated.  Symmetrical Chest wall movement, Good air movement bilaterally, CTAB RRR,No Gallops,Rubs or new Murmurs, No Parasternal Heave +ve B.Sounds, Abd Soft, Non tender, No organomegaly appriciated, No rebound -guarding or rigidity. No Cyanosis, Clubbing or edema, No new Rash or bruise   Data Review   CBC w Diff:  Lab Results  Component Value Date   WBC 10.2 04/21/2015   HGB 11.9* 04/21/2015   HCT 36.2* 04/21/2015   PLT 155 04/21/2015    CMP:  Lab Results  Component Value Date   NA 138 04/21/2015   K 4.5 04/21/2015   CL 97* 04/21/2015   CO2 26 04/21/2015   BUN 67* 04/21/2015   CREATININE 11.15* 04/21/2015  . CBG (last 3)   Recent Labs  04/20/15 1119 04/20/15 1605 04/21/15 0851  GLUCAP 84 158* 81   Lab Results  Component Value Date   HGBA1C 5.8* 04/20/2015    Total Time in preparing paper work, data evaluation and todays exam - 15 minutes  Thurnell Lose M.D on 04/21/2015 at 10:54 AM  [facility]   Office  [phone]

## 2015-04-21 NOTE — Progress Notes (Signed)
Please let us know if to be admitted, will do HD. If not , send to his dialysis unit.

## 2015-04-21 NOTE — Discharge Instructions (Signed)
Follow with Primary MD in 2-3 days   Get CBC, CMP, 2 view Chest X ray checked  by Primary MD next visit.    Activity: As tolerated with Full fall precautions use walker/cane & assistance as needed   Disposition Home    Diet:  Renal - low Carb  Check your Weight same time everyday, if you gain over 2 pounds, or you develop in leg swelling, experience more shortness of breath or chest pain, call your Primary MD immediately. Follow Cardiac Low Salt Diet and 1.2 lit/day fluid restriction.  Accuchecks 4 times/day, Once in AM empty stomach and then before each meal. Log in all results and show them to your Prim.MD in 3 days. If any glucose reading is under 80 or above 300 call your Prim MD immidiately. Follow Low glucose instructions for glucose under 80 as instructed.  On your next visit with your primary care physician please Get Medicines reviewed and adjusted.   Please request your Prim.MD to go over all Hospital Tests and Procedure/Radiological results at the follow up, please get all Hospital records sent to your Prim MD by signing hospital release before you go home.   If you experience worsening of your admission symptoms, develop shortness of breath, life threatening emergency, suicidal or homicidal thoughts you must seek medical attention immediately by calling 911 or calling your MD immediately  if symptoms less severe.  You Must read complete instructions/literature along with all the possible adverse reactions/side effects for all the Medicines you take and that have been prescribed to you. Take any new Medicines after you have completely understood and accpet all the possible adverse reactions/side effects.   Do not drive, operating heavy machinery, perform activities at heights, swimming or participation in water activities or provide baby sitting services if your were admitted for syncope or siezures until you have seen by Primary MD or a Neurologist and advised to do so  again.  Do not drive when taking Pain medications.    Do not take more than prescribed Pain, Sleep and Anxiety Medications  Special Instructions: If you have smoked or chewed Tobacco  in the last 2 yrs please stop smoking, stop any regular Alcohol  and or any Recreational drug use.  Wear Seat belts while driving.   Please note  You were cared for by a hospitalist during your hospital stay. If you have any questions about your discharge medications or the care you received while you were in the hospital after you are discharged, you can call the unit and asked to speak with the hospitalist on call if the hospitalist that took care of you is not available. Once you are discharged, your primary care physician will handle any further medical issues. Please note that NO REFILLS for any discharge medications will be authorized once you are discharged, as it is imperative that you return to your primary care physician (or establish a relationship with a primary care physician if you do not have one) for your aftercare needs so that they can reassess your need for medications and monitor your lab values.

## 2015-04-21 NOTE — Progress Notes (Signed)
Discharge instructions, RX's and follow up appts explained and provided to patient, patient verbalized understanding.Patient d/c home with family left floor via walking refused for staff to walk with him. Patient instructed to go straight to dialysis center for dialysis treatment today. No c/o pain or shortness of breath at discharge.  Pantera Winterrowd, Tivis Ringer, RN

## 2015-04-21 NOTE — Progress Notes (Signed)
Inpatient Diabetes Program Recommendations  AACE/ADA: New Consensus Statement on Inpatient Glycemic Control (2015)  Target Ranges:  Prepandial:   less than 140 mg/dL      Peak postprandial:   less than 180 mg/dL (1-2 hours)      Critically ill patients:  140 - 180 mg/dL   Spoke with patient about home regimen for DM control. Found that patient had not been taking Amaryl for 1 month and just restarted back this past weekend. Patient reports blood glucose mostly in the 90-110 range while off of Amaryl. A1c 5.8%. Paged Dr. Candiss Norse with information found with patient regarding home medications. Adjustments made. Rn notified. Will print new AVS for d/c. Spoke with patient about DM medication changes spoke with him to follow up with an A1c to recheck glucose trends within 3 months. Patient agrees and understands information discussed.  Thanks,  Tama Headings RN, MSN, [facility] Inpatient Diabetes Coordinator Team Pager [phone] (8a-5p)

## 2015-04-29 ENCOUNTER — Encounter (HOSPITAL_COMMUNITY): Payer: Self-pay | Admitting: *Deleted

## 2015-04-29 ENCOUNTER — Emergency Department (HOSPITAL_COMMUNITY): Payer: Medicare Other

## 2015-04-29 ENCOUNTER — Observation Stay (HOSPITAL_COMMUNITY)
Admission: EM | Admit: 2015-04-29 | Discharge: 2015-04-29 | Discharge status: Against Medical Advice | Payer: Medicare Other | Attending: Emergency Medicine | Admitting: Emergency Medicine

## 2015-04-29 DIAGNOSIS — K219 Gastro-esophageal reflux disease without esophagitis: Secondary | ICD-10-CM | POA: Insufficient documentation

## 2015-04-29 DIAGNOSIS — I130 Hypertensive heart and chronic kidney disease with heart failure and stage 1 through stage 4 chronic kidney disease, or unspecified chronic kidney disease: Secondary | ICD-10-CM | POA: Diagnosis not present

## 2015-04-29 DIAGNOSIS — F17210 Nicotine dependence, cigarettes, uncomplicated: Secondary | ICD-10-CM | POA: Insufficient documentation

## 2015-04-29 DIAGNOSIS — Z7982 Long term (current) use of aspirin: Secondary | ICD-10-CM | POA: Insufficient documentation

## 2015-04-29 DIAGNOSIS — I2510 Atherosclerotic heart disease of native coronary artery without angina pectoris: Secondary | ICD-10-CM | POA: Diagnosis not present

## 2015-04-29 DIAGNOSIS — R079 Chest pain, unspecified: Principal | ICD-10-CM | POA: Insufficient documentation

## 2015-04-29 DIAGNOSIS — Z79891 Long term (current) use of opiate analgesic: Secondary | ICD-10-CM | POA: Diagnosis not present

## 2015-04-29 DIAGNOSIS — E1122 Type 2 diabetes mellitus with diabetic chronic kidney disease: Secondary | ICD-10-CM | POA: Diagnosis not present

## 2015-04-29 DIAGNOSIS — I252 Old myocardial infarction: Secondary | ICD-10-CM | POA: Diagnosis not present

## 2015-04-29 DIAGNOSIS — E785 Hyperlipidemia, unspecified: Secondary | ICD-10-CM | POA: Diagnosis not present

## 2015-04-29 DIAGNOSIS — Z8546 Personal history of malignant neoplasm of prostate: Secondary | ICD-10-CM | POA: Insufficient documentation

## 2015-04-29 DIAGNOSIS — M1990 Unspecified osteoarthritis, unspecified site: Secondary | ICD-10-CM | POA: Diagnosis not present

## 2015-04-29 DIAGNOSIS — I4891 Unspecified atrial fibrillation: Secondary | ICD-10-CM | POA: Insufficient documentation

## 2015-04-29 DIAGNOSIS — R778 Other specified abnormalities of plasma proteins: Secondary | ICD-10-CM | POA: Insufficient documentation

## 2015-04-29 DIAGNOSIS — N186 End stage renal disease: Secondary | ICD-10-CM | POA: Insufficient documentation

## 2015-04-29 DIAGNOSIS — I509 Heart failure, unspecified: Secondary | ICD-10-CM | POA: Insufficient documentation

## 2015-04-29 DIAGNOSIS — Z5321 Procedure and treatment not carried out due to patient leaving prior to being seen by health care provider: Secondary | ICD-10-CM | POA: Diagnosis not present

## 2015-04-29 DIAGNOSIS — Z992 Dependence on renal dialysis: Secondary | ICD-10-CM | POA: Diagnosis not present

## 2015-04-29 DIAGNOSIS — Z951 Presence of aortocoronary bypass graft: Secondary | ICD-10-CM | POA: Diagnosis not present

## 2015-04-29 DIAGNOSIS — Z79899 Other long term (current) drug therapy: Secondary | ICD-10-CM | POA: Insufficient documentation

## 2015-04-29 DIAGNOSIS — R7989 Other specified abnormal findings of blood chemistry: Secondary | ICD-10-CM

## 2015-04-29 LAB — PROTIME-INR
INR: 1.21 (ref 0.00–1.49)
PROTHROMBIN TIME: 15.5 s — AB (ref 11.6–15.2)

## 2015-04-29 LAB — COMPREHENSIVE METABOLIC PANEL
ALBUMIN: 3.1 g/dL — AB (ref 3.5–5.0)
ALT: 17 U/L (ref 17–63)
ANION GAP: 15 (ref 5–15)
AST: 21 U/L (ref 15–41)
Alkaline Phosphatase: 87 U/L (ref 38–126)
BILIRUBIN TOTAL: 1.2 mg/dL (ref 0.3–1.2)
BUN: 38 mg/dL — ABNORMAL HIGH (ref 6–20)
CHLORIDE: 95 mmol/L — AB (ref 101–111)
CO2: 30 mmol/L (ref 22–32)
Calcium: 9.4 mg/dL (ref 8.9–10.3)
Creatinine, Ser: 5.69 mg/dL — ABNORMAL HIGH (ref 0.61–1.24)
GFR calc Af Amer: 11 mL/min — ABNORMAL LOW (ref >60)
GFR, EST NON AFRICAN AMERICAN: 9 mL/min — AB (ref >60)
Glucose, Bld: 114 mg/dL — ABNORMAL HIGH (ref 65–99)
POTASSIUM: 4.4 mmol/L (ref 3.5–5.1)
Sodium: 140 mmol/L (ref 135–145)
TOTAL PROTEIN: 7.2 g/dL (ref 6.5–8.1)

## 2015-04-29 LAB — CBC
HEMATOCRIT: 36.6 % — AB (ref 39.0–52.0)
Hemoglobin: 11.8 g/dL — ABNORMAL LOW (ref 13.0–17.0)
MCH: 30.3 pg (ref 26.0–34.0)
MCHC: 32.2 g/dL (ref 30.0–36.0)
MCV: 94.1 fL (ref 78.0–100.0)
PLATELETS: 201 10*3/uL (ref 150–400)
RBC: 3.89 MIL/uL — ABNORMAL LOW (ref 4.22–5.81)
RDW: 17.1 % — AB (ref 11.5–15.5)
WBC: 9.9 10*3/uL (ref 4.0–10.5)

## 2015-04-29 LAB — APTT: APTT: 33 s (ref 24–37)

## 2015-04-29 LAB — I-STAT TROPONIN, ED: Troponin i, poc: 0.25 ng/mL (ref 0.00–0.08)

## 2015-04-29 MED ORDER — ASPIRIN 81 MG PO CHEW: 324.0000 mg | CHEWABLE_TABLET | Freq: Once | ORAL | Status: DC

## 2015-04-29 NOTE — Discharge Instructions (Signed)
Myself, Dr. Sabra Heck, and Dr. Conley Canal expressed the need for admission to the hospital for further evaluation of your chest pain. You refused admission and stated you understand that you may die if you have a heart attack. You are free to change your mind and come back at any time. Please return to the Emergency Department if your symptoms worsen. Please follow up with cardiology in the next 3 days.    Nonspecific Chest Pain It is often hard to find the cause of chest pain. There is always a chance that your pain could be related to something serious, such as a heart attack or a blood clot in your lungs. Chest pain can also be caused by conditions that are not life-threatening. If you have chest pain, it is very important to follow up with your doctor.  HOME CARE  If you were prescribed an antibiotic medicine, finish it all even if you start to feel better.  Avoid any activities that cause chest pain.  Do not use any tobacco products, including cigarettes, chewing tobacco, or electronic cigarettes. If you need help quitting, ask your doctor.  Do not drink alcohol.  Take medicines only as told by your doctor.  Keep all follow-up visits as told by your doctor. This is important. This includes any further testing if your chest pain does not go away.  Your doctor may tell you to keep your head raised (elevated) while you sleep.  Make lifestyle changes as told by your doctor. These may include:  Getting regular exercise. Ask your doctor to suggest some activities that are safe for you.  Eating a heart-healthy diet. Your doctor or a diet specialist (dietitian) can help you to learn healthy eating options.  Maintaining a healthy weight.  Managing diabetes, if necessary.  Reducing stress. GET HELP IF:  Your chest pain does not go away, even after treatment.  You have a rash with blisters on your chest.  You have a fever. GET HELP RIGHT AWAY IF:  Your chest pain is worse.  You have  an increasing cough, or you cough up blood.  You have severe belly (abdominal) pain.  You feel extremely weak.  You pass out (faint).  You have chills.  You have sudden, unexplained chest discomfort.  You have sudden, unexplained discomfort in your arms, back, neck, or jaw.  You have shortness of breath at any time.  You suddenly start to sweat, or your skin gets clammy.  You feel nauseous.  You vomit.  You suddenly feel light-headed or dizzy.  Your heart begins to beat quickly, or it feels like it is skipping beats. These symptoms may be an emergency. Do not wait to see if the symptoms will go away. Get medical help right away. Call your local emergency services (911 in the [place] Do not drive yourself to the hospital.   This information is not intended to replace advice given to you by your health care provider. Make sure you discuss any questions you have with your health care provider.   Document Released: 06/15/2007 Document Revised: 01/17/2014 Document Reviewed: 08/02/2013 Elsevier Interactive Patient Education [employer].

## 2015-04-29 NOTE — ED Notes (Signed)
Pt left after having extensive talk with PA, MD, hospitalist. Pt understands the risks of leaving and is aware of what could happen if leaving AMA. Pt wheeled out with family and encouraged to return if any new or worsening S/S

## 2015-04-29 NOTE — ED Provider Notes (Signed)
CSN: [id number]     Arrival date & time 04/29/15  1614 History   First MD Initiated Contact with Patient 04/29/15 1645     Chief Complaint  Patient presents with  . Chest Pain     (Consider location/radiation/quality/duration/timing/severity/associated sxs/prior Treatment) HPI Comments: Patient reports to ED with complaint of chest pain. Experienced chest pain today, 10/10 pain level, unable to characterize just "hurts." Pain has since subsided. No aggravating or relieving factors. Does not radiate. No treatments attempted. Denies experiencing this type of sensation in past. Denies palpitations, shortness of breath, difficulty breathing, diaphoresis, nausea, vomiting, fatigue, dizziness, or loss of consciousness.   Past medical history significant for coronary artery disease with 5 vessel CABG in 2015 (per sister), hypertension, high cholesterol, diabetes, and ESRD with dialysis MWF. Denies history of stroke or blood clots.   Patient is a 70 y.o. male presenting with chest pain. The history is provided by the patient and a relative.  Chest Pain Pain location:  L chest Pain radiates to:  Does not radiate Pain radiates to the back: no   Duration:  10 minutes Relieved by:  None tried Worsened by:  Nothing tried Ineffective treatments:  None tried Risk factors: coronary artery disease, diabetes mellitus, hypertension, male sex and smoking     Past Medical History  Diagnosis Date  . Joint pain   . Gout   . GERD (gastroesophageal reflux disease)   . Arthritis   . Bladder neck contracture   . Elevated prostate specific antigen (PSA)   . Urethral stricture unspecified   . Murmur, cardiac 04/21/2013  . CHF (congestive heart failure) ([place])   . Shortness of breath   . Dialysis patient ([place])   . Hyperlipidemia   . Diabetes mellitus   . Type 2 diabetes mellitus ([place])   . Coronary artery disease   . Inguinal hernia   . Chronic renal insufficiency   . ESRD (end stage renal disease) ([place])      T, Th, Sat, [address]  . Prostate cancer ([place])     adenocarcinoma gleason 7  . Diabetes mellitus without complication ([place])   . Hypertension   . A-fib ([place])   . Hypoglycemia   . Tobacco use   . ESRD (end stage renal disease) ([place])     dialysis  . NSTEMI (non-ST elevated myocardial infarction) ([place])   . S/P CABG x 5    Past Surgical History  Procedure Laterality Date  . Appendectomy  70 yrs old    open  . Av fistula placement, brachiocephalic  0000000  . Robot assisted laparoscopic radical prostatectomy N/A 05/18/2012    Procedure: ROBOTIC ASSISTED LAPAROSCOPIC RADICAL PROSTATECTOMY;  Surgeon: Alexis Frock, MD;  Location: [facility];  Service: Urology;  Laterality: N/A;  . Lymphadenectomy Bilateral 05/18/2012    Procedure: LYMPHADENECTOMY;  Surgeon: Alexis Frock, MD;  Location: [facility];  Service: Urology;  Laterality: Bilateral;  . Circumcision, non-newborn    . Kidney surgery    . Pelvic laparoscopy    . Coronary artery bypass graft N/A 08/19/2013    Procedure: CORONARY ARTERY BYPASS GRAFTING (CABG) times 5 using left internal mammary artery to LAD, saphenous vein graft to diagonal; sequential saphenous vein graft to intermediate and distal circumflex; and saphenous vein graft to right coronary artery ;  Surgeon: [name], MD;  Location: [facility];  Service: Open Heart Surgery;  Laterality: N/A;  . Intraoperative transesophageal echocardiogram N/A 08/19/2013    Procedure: INTRAOPERATIVE TRANSESOPHAGEAL ECHOCARDIOGRAM;  Surgeon: Percell Miller  Maryruth Bun, MD;  Location: [facility];  Service: Open Heart Surgery;  Laterality: N/A;  . Endovein harvest of greater saphenous vein Left 08/19/2013    Procedure: ENDOVEIN HARVEST OF GREATER SAPHENOUS VEIN from left thigh and calf;  Surgeon: [name], MD;  Location: [facility];  Service: Open Heart Surgery;  Laterality: Left;  . Left heart catheterization with coronary angiogram N/A 08/14/2013    Procedure: LEFT HEART CATHETERIZATION WITH CORONARY  ANGIOGRAM;  Surgeon: Wellington Hampshire, MD;  Location: [facility];  Service: Cardiovascular;  Laterality: N/A;  . Inguinal hernia repair Right 01/17/2014    Procedure: LAPAROSCOPIC RIGHT INGUINAL HERNIA REPAIR ;  Surgeon: Ralene Ok, MD;  Location: [facility];  Service: General;  Laterality: Right;  . Insertion of mesh Right 01/17/2014    Procedure: INSERTION OF MESH;  Surgeon: Ralene Ok, MD;  Location: [facility];  Service: General;  Laterality: Right;  . Cystoscopy w/ retrogrades Bilateral 02/05/2014    Procedure: CYSTOSCOPY WITH RETROGRADE PYELOGRAM, REMOVAL OF ERODED CLIP;  Surgeon: Alexis Frock, MD;  [facility];  Service: Urology;  Laterality: Bilateral;  . Holmium laser application N/A [id number]    Procedure: HOLMIUM LASER APPLICATION;  Surgeon: Alexis Frock, MD;  [facility];  Service: Urology;  Laterality: N/A;   Family History  Problem Relation Age of Onset  . Diabetes Brother   . Kidney disease Brother   . Hypertension Brother   . Stroke Mother   . Hypertension Sister   . Hypertension Brother   . Hypertension Brother   . Hypertension Sister    Social History  Substance Use Topics  . Smoking status: Current Every Day Smoker -- 0.25 packs/day for 40 years    Types: Cigarettes  . Smokeless tobacco: None     Comment: reports also using e- cigarette but trying to quit  . Alcohol Use: No    Review of Systems  Cardiovascular: Positive for chest pain.  All other systems reviewed and are negative.     Allergies  Ace inhibitors  Home Medications   Prior to Admission medications   Medication Sig Start Date End Date Taking? Authorizing Provider  albuterol (PROVENTIL HFA;VENTOLIN HFA) 108 (90 Base) MCG/ACT inhaler Inhale 2 puffs into the lungs every 4 (four) hours. 02/10/15  Yes Tanna Furry, MD  aspirin EC 81 MG tablet Take 81 mg by mouth daily.   Yes Historical Provider, MD  atorvastatin (LIPITOR) 20 MG tablet Take 20 mg by mouth every morning.   Yes Historical  Provider, MD  cinacalcet (SENSIPAR) 30 MG tablet Take 60 mg by mouth daily with supper.    Yes Historical Provider, MD  metoprolol succinate (TOPROL-XL) 25 MG 24 hr tablet Take 25 tablets by mouth every Tuesday, Thursday, Saturday, and Sunday. Take 1 tablet on non dialysis days.  Skips dose on dialysis days 02/13/14  Yes Historical Provider, MD  multivitamin (RENA-VIT) TABS tablet Take 1 tablet by mouth daily. 12/10/13  Yes Historical Provider, MD  omeprazole (PRILOSEC) 20 MG capsule Take 20 mg by mouth daily.   Yes Historical Provider, MD  oxyCODONE-acetaminophen (ROXICET) 5-325 MG per tablet Take 1-2 tablets by mouth every 6 (six) hours as needed for moderate pain or severe pain. Post-operatively 02/05/14  Yes Alexis Frock, MD  RENVELA 2.4 g PACK Take 3 Packages by mouth 3 (three) times daily. Takes 2 packs at supper and 3 packs at breakfast and dinner 04/06/15  Yes Historical Provider, MD  tamsulosin (FLOMAX) 0.4 MG CAPS capsule Take  1 capsule by mouth daily. 01/09/14  Yes Historical Provider, MD  VOLTAREN 1 % GEL Take 1 application by mouth daily as needed (for pain).  12/24/13  Yes Historical Provider, MD  darbepoetin (ARANESP) 25 MCG/0.42ML SOLN injection Inject 0.42 mLs (25 mcg total) into the vein every Thursday with hemodialysis. 09/06/13   [name], PA-C  doxycycline (VIBRAMYCIN) 100 MG capsule Take 1 capsule (100 mg total) by mouth 2 (two) times daily. Patient not taking: Reported on 03/09/2015 02/10/15   Tanna Furry, MD  ferric gluconate 125 mg in sodium chloride 0.9 % 100 mL Inject 125 mg into the vein Every Tuesday,Thursday,and Saturday with dialysis. 09/06/13   [name], PA-C  naphazoline-pheniramine (NAPHCON-A) 0.025-0.3 % ophthalmic solution Place 1 drop into both eyes 4 (four) times daily as needed for irritation. 09/06/13   [name], PA-C  predniSONE (DELTASONE) 20 MG tablet Take 1 tablet (20 mg total) by mouth 2 (two) times daily with a meal. Patient not taking: Reported on  03/09/2015 02/10/15   Tanna Furry, MD  senna-docusate (SENOKOT-S) 8.6-50 MG per tablet Take 1 tablet by mouth 2 (two) times daily. While taking pain meds to prevent constipation 02/05/14   Alexis Frock, MD   BP 113/69 mmHg  Pulse 104  Temp(Src) 97.8 F (36.6 C) (Oral)  Resp 17  Ht 5\' 7"  (1.702 m)  Wt 77.565 kg  BMI 26.78 kg/m2  SpO2 97% Physical Exam  Constitutional: He appears well-developed and well-nourished. No distress.  HENT:  Head: Normocephalic and atraumatic.  Mouth/Throat: Oropharynx is clear and moist. No oropharyngeal exudate.  Eyes: Conjunctivae and EOM are normal. Pupils are equal, round, and reactive to light. Right eye exhibits no discharge. Left eye exhibits no discharge. No scleral icterus.  Neck: Normal range of motion. Neck supple.  Cardiovascular: Normal rate, normal heart sounds and intact distal pulses.   No murmur heard. Irregularly irregular  Pulmonary/Chest: Effort normal. No respiratory distress. Rales:  slight rales noted.  Abdominal: Soft. Bowel sounds are normal. There is no tenderness. There is no rebound and no guarding.  Musculoskeletal: Normal range of motion.  Lymphadenopathy:    He has no cervical adenopathy.  Neurological: He is alert. Coordination normal.  Skin: Skin is warm and dry. He is not diaphoretic.     Psychiatric: He has a normal mood and affect.    ED Course  Procedures (including critical care time) Labs Review Labs Reviewed  CBC - Abnormal; Notable for the following:    RBC 3.89 (*)    Hemoglobin 11.8 (*)    HCT 36.6 (*)    RDW 17.1 (*)    All other components within normal limits  COMPREHENSIVE METABOLIC PANEL - Abnormal; Notable for the following:    Chloride 95 (*)    Glucose, Bld 114 (*)    BUN 38 (*)    Creatinine, Ser 5.69 (*)    Albumin 3.1 (*)    GFR calc non Af Amer 9 (*)    GFR calc Af Amer 11 (*)    All other components within normal limits  PROTIME-INR - Abnormal; Notable for the following:     Prothrombin Time 15.5 (*)    All other components within normal limits  I-STAT TROPOININ, ED - Abnormal; Notable for the following:    Troponin i, poc 0.25 (*)    All other components within normal limits  APTT    Imaging Review Dg Chest 2 View  04/29/2015  CLINICAL DATA:  Chest  pain.  CABG. EXAM: CHEST  2 VIEW COMPARISON:  03/09/2015 chest radiograph. FINDINGS: Sternotomy wires appear aligned and intact. Stable cardiomediastinal silhouette with mild cardiomegaly. No pneumothorax. No pleural effusion. Borderline mild pulmonary edema. No acute consolidative airspace disease. IMPRESSION: Stable mild cardiomegaly with borderline mild pulmonary edema, suggesting mild congestive heart failure. Electronically Signed   By: Ilona Sorrel M.D.   On: 04/29/2015 18:31   I have personally reviewed and evaluated these images and lab results as part of my medical decision-making.  Mildly enlarged cardiac silhouette, unchanged from previous imaging. No consolidations or effusions, no evidence of PTX. Mild pulmonary edema. Evaluation concurs with radiology report.    EKG Interpretation   Date/Time:  Wednesday April 29 2015 16:30:04 EDT Ventricular Rate:  103 PR Interval:    QRS Duration: 96 QT Interval:  348 QTC Calculation: 455 R Axis:   155 Text Interpretation:  Atrial fibrillation Ventricular premature complex  Probable lateral infarct, age indeterminate Since last tracing Atrial  fibrillation NOW PRESENT Abnormal ekg Confirmed by MILLER  MD, BRIAN  [phone]) on 04/29/2015 5:58:20 PM      MDM   Final diagnoses:  Chest pain, unspecified chest pain type  Elevated troponin    Mildly elevated troponin at 0.25; however, chronically elevated compared to previous labs and not uncommon with ESRD patients on dialysis. Hgb/Hct low; however, similar to previous labs and most likely secondary to chronic disease. PT borderline elevated, however INR within normal limits, this may be transient. EKG reveals  atrial fibrillation, which is new compared to previous tracing. Review of previous notes suggest previous episodes of atrial fibrillation, atrial fibrillation is most likely paroxysmal. CHADSVASC score 5; review of previous cardiology notes suggest holding off anticoagulation secondary to history of anemia. Patient is not anticoagulated. Chest x ray shows mild cardiomegaly and mild pulmonary edema, most likely secondary to CHF. Pulmonary edema may be secondary to ESRD.   Given previous cardiac history and episode of chest pain still concern for ACS. Dr. Sabra Heck, Dr. Conley Canal, and myself had discussion with patient regarding admission for further observation and stress test tomorrow morning to aid in cardiac rule out. Patient refused admission. He voiced understanding that he may die if he leaves and has a heart attack. He refused to come back tomorrow for a stress test. Significant other was at bedside and stated this is patient's baseline interaction and understanding. Expressed to patient the importance of coming back if symptoms re-occur. Nurse to have patient sign AMA.   Roxanna Mew, [place] 04/29/15 2104  Noemi Chapel, MD 04/30/15 [phone]

## 2015-04-29 NOTE — ED Provider Notes (Signed)
The patient is a 70 year old male, he has a history of CABG approximately one and a half years ago, he presents to the hospital today with approximately one or 2 hours of chest pain, it is difficult to discern how long it lasted as the patient is a terrible historian. He states that the symptoms have now completely resolved but during that time frame he had some pain in his neck, there is some cramping in his hand, this was all very abnormal for him. He is a dialysis patient Monday Wednesday and Friday, he did go today and spent the full time at dialysis without any symptoms.  On exam the patient is in no distress, he does have some rales in the posterior lung fields but is not tachypneic and speaks in full sentences. He has no JVD, his abdomen is soft, his heart sounds are irregular, pulse between 101 110, strong radial artery pulse on the right, good fistula thrill on the left, no redness or tenderness overlying the left dialysis access. The patient has labs and EKG and chest x-ray ordered, he is high risk for coronary disease and I suspect the patient will need to be admitted for observation if nothing else is found   D/w hopspitalist Dr. Conley Canal who will admit. Requests holding orders  After the hospitalist kindly came and saw the patient and had a long discussion with the patient the patient refused to stay in the hospital. I then went to discuss this with the patient at the bedside, his significant other was there as well, she reports that he is in his normal frame of mind, the patient was able to tell me that he knew that there was risk of heart attack if he should leave tonight, he refused to stay, he refused to come back tomorrow for a stress test, he states that he will come back if his symptoms worsen. The patient appears to have a sound mind and medical decision making capacity at this time. He is aware that he had an elevated troponin, he is aware that he is at high risk for cardiac disease,  myocardial infarction and possibly death. He wants to leave [place] despite this.  Medical screening examination/treatment/procedure(s) were conducted as a shared visit with non-physician practitioner(s) and myself.  I personally evaluated the patient during the encounter.  Clinical Impression:   Final diagnoses:  Chest pain, unspecified chest pain type  Elevated troponin        EKG Interpretation  Date/Time:  Wednesday April 29 2015 16:30:04 EDT Ventricular Rate:  103 PR Interval:    QRS Duration: 96 QT Interval:  348 QTC Calculation: 455 R Axis:   155 Text Interpretation:  Atrial fibrillation Ventricular premature complex Probable lateral infarct, age indeterminate Since last tracing Atrial fibrillation NOW PRESENT Abnormal ekg Confirmed by Sabra Heck  MD, Emogene Muratalla ([id number]) on 04/29/2015 5:58:20 PM        Noemi Chapel, MD 04/30/15 [phone]

## 2015-04-29 NOTE — ED Notes (Signed)
Pt was getting dialysis and begin to have chest pain.

## 2015-04-29 NOTE — ED Notes (Signed)
Pt received 324 of aspirin in route.

## 2015-05-28 ENCOUNTER — Emergency Department (HOSPITAL_COMMUNITY)
Admission: EM | Admit: 2015-05-28 | Discharge: 2015-05-28 | Disposition: A | Discharge status: Home / Self Care | Payer: Medicare Other | Attending: Emergency Medicine | Admitting: Emergency Medicine

## 2015-05-28 ENCOUNTER — Emergency Department (HOSPITAL_COMMUNITY): Payer: Medicare Other

## 2015-05-28 ENCOUNTER — Encounter (HOSPITAL_COMMUNITY): Payer: Self-pay | Admitting: Emergency Medicine

## 2015-05-28 DIAGNOSIS — F17210 Nicotine dependence, cigarettes, uncomplicated: Secondary | ICD-10-CM | POA: Diagnosis not present

## 2015-05-28 DIAGNOSIS — Z951 Presence of aortocoronary bypass graft: Secondary | ICD-10-CM | POA: Diagnosis not present

## 2015-05-28 DIAGNOSIS — E785 Hyperlipidemia, unspecified: Secondary | ICD-10-CM | POA: Insufficient documentation

## 2015-05-28 DIAGNOSIS — Z8546 Personal history of malignant neoplasm of prostate: Secondary | ICD-10-CM | POA: Insufficient documentation

## 2015-05-28 DIAGNOSIS — I2510 Atherosclerotic heart disease of native coronary artery without angina pectoris: Secondary | ICD-10-CM | POA: Insufficient documentation

## 2015-05-28 DIAGNOSIS — R0602 Shortness of breath: Secondary | ICD-10-CM | POA: Diagnosis not present

## 2015-05-28 DIAGNOSIS — K219 Gastro-esophageal reflux disease without esophagitis: Secondary | ICD-10-CM | POA: Diagnosis not present

## 2015-05-28 DIAGNOSIS — I132 Hypertensive heart and chronic kidney disease with heart failure and with stage 5 chronic kidney disease, or end stage renal disease: Secondary | ICD-10-CM | POA: Diagnosis not present

## 2015-05-28 DIAGNOSIS — Z7982 Long term (current) use of aspirin: Secondary | ICD-10-CM | POA: Diagnosis not present

## 2015-05-28 DIAGNOSIS — E11649 Type 2 diabetes mellitus with hypoglycemia without coma: Secondary | ICD-10-CM | POA: Diagnosis not present

## 2015-05-28 DIAGNOSIS — Z992 Dependence on renal dialysis: Secondary | ICD-10-CM | POA: Diagnosis not present

## 2015-05-28 DIAGNOSIS — I252 Old myocardial infarction: Secondary | ICD-10-CM | POA: Diagnosis not present

## 2015-05-28 DIAGNOSIS — N186 End stage renal disease: Secondary | ICD-10-CM

## 2015-05-28 DIAGNOSIS — I509 Heart failure, unspecified: Secondary | ICD-10-CM | POA: Insufficient documentation

## 2015-05-28 LAB — CBC WITH DIFFERENTIAL/PLATELET
BASOS PCT: 0 %
Basophils Absolute: 0 10*3/uL (ref 0.0–0.1)
Eosinophils Absolute: 0 10*3/uL (ref 0.0–0.7)
Eosinophils Relative: 0 %
HEMATOCRIT: 39.4 % (ref 39.0–52.0)
HEMOGLOBIN: 12.8 g/dL — AB (ref 13.0–17.0)
LYMPHS PCT: 13 %
Lymphs Abs: 1.1 10*3/uL (ref 0.7–4.0)
MCH: 30.4 pg (ref 26.0–34.0)
MCHC: 32.5 g/dL (ref 30.0–36.0)
MCV: 93.6 fL (ref 78.0–100.0)
MONO ABS: 0.8 10*3/uL (ref 0.1–1.0)
MONOS PCT: 9 %
NEUTROS ABS: 6.3 10*3/uL (ref 1.7–7.7)
NEUTROS PCT: 78 %
Platelets: 158 10*3/uL (ref 150–400)
RBC: 4.21 MIL/uL — ABNORMAL LOW (ref 4.22–5.81)
RDW: 18.3 % — ABNORMAL HIGH (ref 11.5–15.5)
WBC: 8.1 10*3/uL (ref 4.0–10.5)

## 2015-05-28 LAB — BLOOD GAS, VENOUS

## 2015-05-28 LAB — BASIC METABOLIC PANEL
ANION GAP: 19 — AB (ref 5–15)
BUN: 35 mg/dL — ABNORMAL HIGH (ref 6–20)
CHLORIDE: 93 mmol/L — AB (ref 101–111)
CO2: 29 mmol/L (ref 22–32)
Calcium: 10.6 mg/dL — ABNORMAL HIGH (ref 8.9–10.3)
Creatinine, Ser: 8.98 mg/dL — ABNORMAL HIGH (ref 0.61–1.24)
GFR calc non Af Amer: 5 mL/min — ABNORMAL LOW (ref >60)
GFR, EST AFRICAN AMERICAN: 6 mL/min — AB (ref >60)
GLUCOSE: 96 mg/dL (ref 65–99)
Potassium: 4.1 mmol/L (ref 3.5–5.1)
Sodium: 141 mmol/L (ref 135–145)

## 2015-05-28 LAB — I-STAT TROPONIN, ED: Troponin i, poc: 0.29 ng/mL (ref 0.00–0.08)

## 2015-05-28 LAB — I-STAT VENOUS BLOOD GAS, ED
Acid-Base Excess: 10 mmol/L — ABNORMAL HIGH (ref 0.0–2.0)
BICARBONATE: 34.2 meq/L — AB (ref 20.0–24.0)
O2 SAT: 22 %
PCO2 VEN: 44.5 mmHg — AB (ref 45.0–50.0)
PH VEN: 7.494 — AB (ref 7.250–7.300)
PO2 VEN: 15 mmHg — AB (ref 31.0–45.0)
TCO2: 36 mmol/L (ref 0–100)

## 2015-05-28 LAB — I-STAT CG4 LACTIC ACID, ED: LACTIC ACID, VENOUS: 1.8 mmol/L (ref 0.5–2.0)

## 2015-05-28 LAB — BRAIN NATRIURETIC PEPTIDE: B Natriuretic Peptide: >4500 pg/mL — ABNORMAL HIGH (ref 0.0–100.0)

## 2015-05-28 MED ORDER — HEPARIN (PORCINE) IN NACL 100-0.45 UNIT/ML-% IJ SOLN: 12.0000 [IU]/kg/h | INTRAMUSCULAR | Status: DC

## 2015-05-28 MED ORDER — ASPIRIN EC 325 MG PO TBEC: 325.0000 mg | DELAYED_RELEASE_TABLET | Freq: Once | ORAL | Status: DC

## 2015-05-28 MED ORDER — ASPIRIN 81 MG PO CHEW
324.0000 mg | CHEWABLE_TABLET | Freq: Once | ORAL | Status: AC
  Administered 2015-05-28: 324 mg via ORAL
  Filled 2015-05-28: qty 4

## 2015-05-28 NOTE — ED Notes (Signed)
Per EMS, pt reports increased SOB this morning at 3am. Pt reports feeling SOB yesterday. Upon EMS arrival to pt was 80% SpO2 on RA. Pt had audible rhales and was placed on C-pap. Pt is scheduled for dialysis today. Pt taken off C-pap upon arrival. Pt alert x4. NAD at this time.

## 2015-05-28 NOTE — Discharge Instructions (Signed)
°  Go to dialysis today for irregular treatment by 11:15. He unable to get dialysis return to the emergency department. If you continue to feel worsening shortness of breath, chest pain or other new or worsening symptoms following dialysis return to the emergency department for reevaluation.

## 2015-05-28 NOTE — ED Provider Notes (Signed)
CSN: [id number]     Arrival date & time 05/28/15  0809 History   First MD Initiated Contact with Patient 05/28/15 0809     No chief complaint on file.    (Consider location/radiation/quality/duration/timing/severity/associated sxs/prior Treatment) Patient is a 70 y.o. male presenting with shortness of breath. The history is provided by the patient, medical records and the EMS personnel. No language interpreter was used.  Shortness of Breath Severity:  Moderate Onset quality:  Gradual Duration:  1 day Timing:  Constant Progression:  Worsening Chronicity:  Recurrent Context comment:  Started feeling SOB yesterday while at [employer]. Called EMS this morning who found patient w/ O2 sats 80% on room air. Patient reports last dialysis was Tuesday and he is due for dialysis today Relieved by:  Nothing Worsened by:  Nothing tried Ineffective treatments:  None tried Associated symptoms: cough   Associated symptoms: no abdominal pain, no fever, no headaches, no neck pain, no rash and no vomiting     Past Medical History  Diagnosis Date  . Joint pain   . Gout   . GERD (gastroesophageal reflux disease)   . Arthritis   . Bladder neck contracture   . Elevated prostate specific antigen (PSA)   . Urethral stricture unspecified   . Murmur, cardiac 04/21/2013  . CHF (congestive heart failure) ([place])   . Shortness of breath   . Dialysis patient ([place])   . Hyperlipidemia   . Diabetes mellitus   . Type 2 diabetes mellitus ([place])   . Coronary artery disease   . Inguinal hernia   . Chronic renal insufficiency   . ESRD (end stage renal disease) ([place])     T, Th, Sat, [address]  . Prostate cancer ([place])     adenocarcinoma gleason 7  . Diabetes mellitus without complication ([place])   . Hypertension   . A-fib ([place])   . Hypoglycemia   . Tobacco use   . ESRD (end stage renal disease) ([place])     dialysis  . NSTEMI (non-ST elevated myocardial infarction) ([place])   . S/P CABG x 5    Past Surgical  History  Procedure Laterality Date  . Appendectomy  70 yrs old    open  . Av fistula placement, brachiocephalic  0000000  . Robot assisted laparoscopic radical prostatectomy N/A 05/18/2012    Procedure: ROBOTIC ASSISTED LAPAROSCOPIC RADICAL PROSTATECTOMY;  Surgeon: Alexis Frock, MD;  Location: [facility];  Service: Urology;  Laterality: N/A;  . Lymphadenectomy Bilateral 05/18/2012    Procedure: LYMPHADENECTOMY;  Surgeon: Alexis Frock, MD;  Location: [facility];  Service: Urology;  Laterality: Bilateral;  . Circumcision, non-newborn    . Kidney surgery    . Pelvic laparoscopy    . Coronary artery bypass graft N/A 08/19/2013    Procedure: CORONARY ARTERY BYPASS GRAFTING (CABG) times 5 using left internal mammary artery to LAD, saphenous vein graft to diagonal; sequential saphenous vein graft to intermediate and distal circumflex; and saphenous vein graft to right coronary artery ;  Surgeon: [name], MD;  Location: [facility];  Service: Open Heart Surgery;  Laterality: N/A;  . Intraoperative transesophageal echocardiogram N/A 08/19/2013    Procedure: INTRAOPERATIVE TRANSESOPHAGEAL ECHOCARDIOGRAM;  Surgeon: [name], MD;  Location: [facility];  Service: Open Heart Surgery;  Laterality: N/A;  . Endovein harvest of greater saphenous vein Left 08/19/2013    Procedure: ENDOVEIN HARVEST OF GREATER SAPHENOUS VEIN from left thigh and calf;  Surgeon: [name], MD;  Location: [facility];  Service: Open Heart Surgery;  Laterality: Left;  . Left heart catheterization with coronary angiogram N/A 08/14/2013    Procedure: LEFT HEART CATHETERIZATION WITH CORONARY ANGIOGRAM;  Surgeon: Wellington Hampshire, MD;  Location: [facility];  Service: Cardiovascular;  Laterality: N/A;  . Inguinal hernia repair Right 01/17/2014    Procedure: LAPAROSCOPIC RIGHT INGUINAL HERNIA REPAIR ;  Surgeon: Ralene Ok, MD;  Location: [facility];  Service: General;  Laterality: Right;  . Insertion of mesh Right 01/17/2014    Procedure: INSERTION  OF MESH;  Surgeon: Ralene Ok, MD;  Location: [facility];  Service: General;  Laterality: Right;  . Cystoscopy w/ retrogrades Bilateral 02/05/2014    Procedure: CYSTOSCOPY WITH RETROGRADE PYELOGRAM, REMOVAL OF ERODED CLIP;  Surgeon: Alexis Frock, MD;  Location[facility];  Service: Urology;  Laterality: Bilateral;  . Holmium laser application N/A [id number]    Procedure: HOLMIUM LASER APPLICATION;  Surgeon: Alexis Frock, MD;  [facility];  Service: Urology;  Laterality: N/A;   Family History  Problem Relation Age of Onset  . Diabetes Brother   . Kidney disease Brother   . Hypertension Brother   . Stroke Mother   . Hypertension Sister   . Hypertension Brother   . Hypertension Brother   . Hypertension Sister    Social History  Substance Use Topics  . Smoking status: Current Every Day Smoker -- 0.25 packs/day for 40 years    Types: Cigarettes  . Smokeless tobacco: Not on file     Comment: reports also using e- cigarette but trying to quit  . Alcohol Use: No    Review of Systems  Constitutional: Negative for fever and chills.  HENT: Negative for congestion and rhinorrhea.   Eyes: Negative for visual disturbance.  Respiratory: Positive for cough and shortness of breath.   Gastrointestinal: Negative for nausea, vomiting, abdominal pain and diarrhea.  Genitourinary:       ESRD on T/Th/Sat, does not make urine  Musculoskeletal: Negative for back pain and neck pain.  Skin: Negative for pallor and rash.  Neurological: Negative for dizziness and headaches.  Psychiatric/Behavioral: Negative for confusion.      Allergies  Ace inhibitors  Home Medications   Prior to Admission medications   Medication Sig Start Date End Date Taking? Authorizing Provider  albuterol (PROVENTIL HFA;VENTOLIN HFA) 108 (90 Base) MCG/ACT inhaler Inhale 2 puffs into the lungs every 4 (four) hours. 02/10/15   Tanna Furry, MD  aspirin EC 81 MG tablet Take 81 mg by mouth daily.    Historical Provider, MD   atorvastatin (LIPITOR) 20 MG tablet Take 20 mg by mouth every morning.    Historical Provider, MD  cinacalcet (SENSIPAR) 30 MG tablet Take 60 mg by mouth daily with supper.     Historical Provider, MD  darbepoetin (ARANESP) 25 MCG/0.42ML SOLN injection Inject 0.42 mLs (25 mcg total) into the vein every Thursday with hemodialysis. 09/06/13   [name], PA-C  doxycycline (VIBRAMYCIN) 100 MG capsule Take 1 capsule (100 mg total) by mouth 2 (two) times daily. Patient not taking: Reported on 03/09/2015 02/10/15   Tanna Furry, MD  ferric gluconate 125 mg in sodium chloride 0.9 % 100 mL Inject 125 mg into the vein Every Tuesday,Thursday,and Saturday with dialysis. 09/06/13   John Giovanni, PA-C  metoprolol succinate (TOPROL-XL) 25 MG 24 hr tablet Take 25 tablets by mouth every Tuesday, Thursday, Saturday, and Sunday. Take 1 tablet on non dialysis days.  Skips dose on dialysis days 02/13/14  Historical Provider, MD  multivitamin (RENA-VIT) TABS tablet Take 1 tablet by mouth daily. 12/10/13   Historical Provider, MD  naphazoline-pheniramine (NAPHCON-A) 0.025-0.3 % ophthalmic solution Place 1 drop into both eyes 4 (four) times daily as needed for irritation. 09/06/13   [name], PA-C  omeprazole (PRILOSEC) 20 MG capsule Take 20 mg by mouth daily.    Historical Provider, MD  oxyCODONE-acetaminophen (ROXICET) 5-325 MG per tablet Take 1-2 tablets by mouth every 6 (six) hours as needed for moderate pain or severe pain. Post-operatively 02/05/14   Alexis Frock, MD  predniSONE (DELTASONE) 20 MG tablet Take 1 tablet (20 mg total) by mouth 2 (two) times daily with a meal. Patient not taking: Reported on 03/09/2015 02/10/15   Tanna Furry, MD  RENVELA 2.4 g PACK Take 3 Packages by mouth 3 (three) times daily. Takes 2 packs at supper and 3 packs at breakfast and dinner 04/06/15   Historical Provider, MD  senna-docusate (SENOKOT-S) 8.6-50 MG per tablet Take 1 tablet by mouth 2 (two) times daily. While taking pain meds to  prevent constipation 02/05/14   Alexis Frock, MD  tamsulosin (FLOMAX) 0.4 MG CAPS capsule Take 1 capsule by mouth daily. 01/09/14   Historical Provider, MD  VOLTAREN 1 % GEL Take 1 application by mouth daily as needed (for pain).  12/24/13   Historical Provider, MD   BP 104/81 mmHg  Pulse 99  Resp 15  SpO2 96% Physical Exam  Constitutional: He is oriented to person, place, and time. He appears well-developed and well-nourished. He appears distressed (mild respiratory distress).  HENT:  Head: Normocephalic and atraumatic.  Eyes: EOM are normal. Pupils are equal, round, and reactive to light.  Neck: Normal range of motion. Neck supple. JVD present.  Cardiovascular: Normal rate and intact distal pulses.  An irregularly irregular rhythm present.  Pulmonary/Chest: Accessory muscle usage present. He has rales.  Abdominal: Soft. He exhibits no distension. There is no tenderness.  Musculoskeletal: Normal range of motion. He exhibits edema (bilateral pedal edema). He exhibits no tenderness.  Neurological: He is alert and oriented to person, place, and time.  Skin: Skin is warm and dry. No rash noted.  Psychiatric: He has a normal mood and affect.  Nursing note and vitals reviewed.   ED Course  Procedures (including critical care time) Labs Review Labs Reviewed  CBC WITH DIFFERENTIAL/PLATELET - Abnormal; Notable for the following:    RBC 4.21 (*)    Hemoglobin 12.8 (*)    RDW 18.3 (*)    All other components within normal limits  I-STAT TROPOININ, ED - Abnormal; Notable for the following:    Troponin i, poc 0.29 (*)    All other components within normal limits  BASIC METABOLIC PANEL  BRAIN NATRIURETIC PEPTIDE  I-STAT TROPOININ, ED    Imaging Review No results found. I have personally reviewed and evaluated these images and lab results as part of my medical decision-making.   EKG Interpretation   Date/Time:  Thursday May 28 2015 08:14:08 EDT Ventricular Rate:  99 PR Interval:     QRS Duration: 192 QT Interval:  392 QTC Calculation: 503 R Axis:   168 Text Interpretation:  Atrial flutter with varied AV block, Nonspecific  intraventricular conduction delay Probable lateral infarct, age  indeterminate Otherwise no significant change Confirmed by FLOYD MD,  DANIEL [phone]) on 05/28/2015 8:54:59 AM      MDM   Final diagnoses:  None    Patient is a 70 year old male with past medical history  of paroxysmal A. fib Not on anticoagulation, end-stage renal disease on HD, diabetes, hypertension, CAD S/P CABG, polysubstance abuse who presents with worsening shortness of breath on exertion. Patient arrived via EMS and had been placed on BiPAP prior to arrival for hypoxia and increased work of breathing.   On presentation BiPAP was removed and patient is maintained appropriate O2 sats on Room air. Vital signs otherwise stable. Exam significant for JVD and bilateral rales consistently fluid overload. EKG is unchanged from prior. I-STAT troponin is elevated but on review of recent records this appears to be near patient's baseline, likely secondary to poor renal function rather than coronary artery disease. Aspirin given but anticoagulation was deferred. Potassium normal. Labs and x-rays consistent with patient's known end-stage renal disease. Patient does not have significant acidosis. BNP is elevated consistent with fluid overload. X-ray shows mild vascular congestion congestion without significant edema. Discussed results with patient reports that he is feeling better. He is not currently requiring oxygen to maintain appropriate O2 sat on room air without increased work of breathing. Discussed laboratory and imaging findings with patient reports that he is feeling better. Family called the Dialysis center who states the patient can get to dialysis bby 11:15 he will be dialyzed today. At this time I think discharge is reasonable as the patient needs dialysis for fluid overload but is  otherwise stable. Patient is discharged with strict instructions to go to dialysis immediately. He states that he is able to do so. Advised that if he continues to have worsening shortness of breath on exertion, chest pain or other new or worsening symptoms following dialysis that he should come back to the emergency department for evaluation. Patient and family are in agreement with this plan.  Discussed with Dr. Tyrone Nine, ED attending    Gibson Ramp, MD 05/28/15 [place], DO 05/29/15 2015

## 2015-06-11 DEATH — deceased

## 2015-08-09 IMAGING — CR DG CHEST 1V PORT
1 series · 1 of 1 positions shown · non-contrast
Comparison: 04/21/2013

CLINICAL DATA: Chest pain

EXAM:
PORTABLE CHEST - 1 VIEW

[AP]
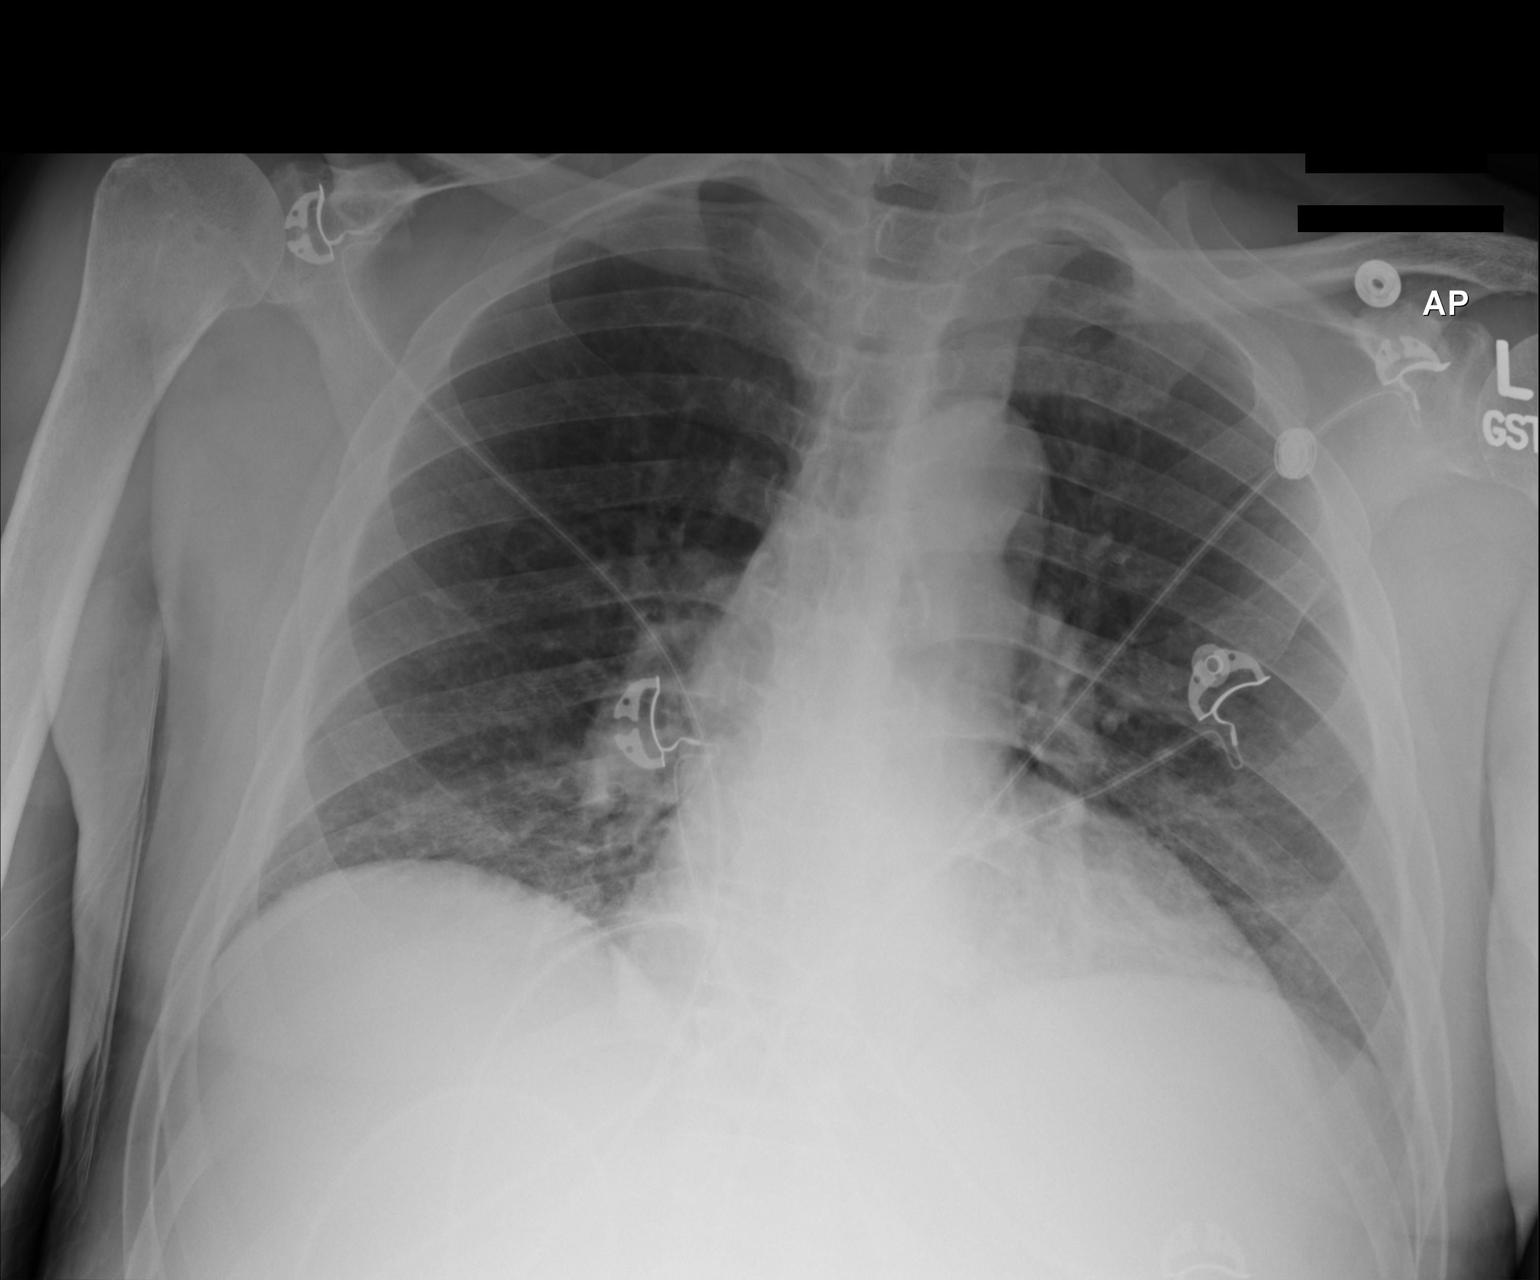

[1 of 1 positions shown; findings below may reference images not displayed]

FINDINGS: Mild bibasilar atelectasis/infiltrate. Negative for heart failure or
effusion. Heart size upper normal. Negative for mass lesion.
IMPRESSION: Mild bibasilar atelectasis/ infiltrate. Resolution of congestive
heart failure since the prior study.

## 2015-08-15 IMAGING — CR DG CHEST 1V PORT
1 series · 1 of 1 positions shown · non-contrast
Comparison: August 13, 2013

CLINICAL DATA: Coronary artery disease ; status post coronary
artery bypass grafting

EXAM:
PORTABLE CHEST - 1 VIEW

[AP]
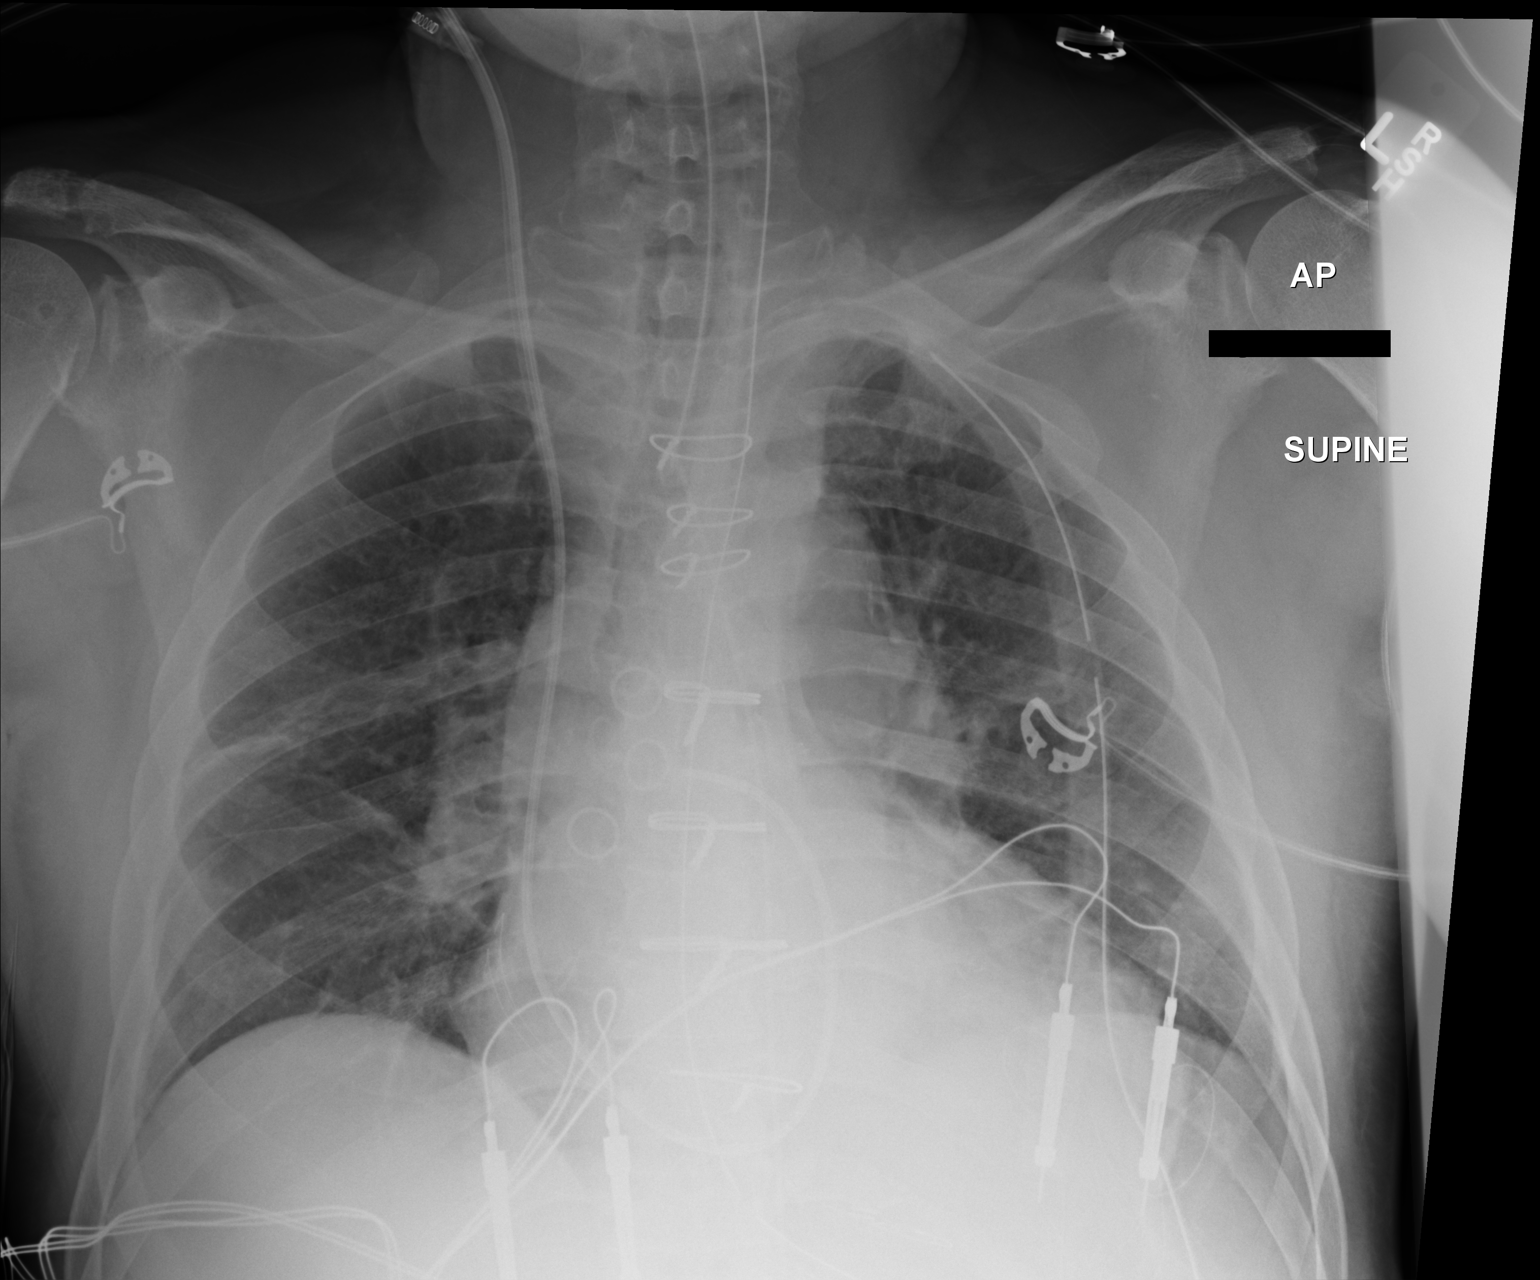

[1 of 1 positions shown; findings below may reference images not displayed]

FINDINGS: Endotracheal tube tip is 4.8 cm above the carina. There is a chest
tube on the left. There is an mediastinal drain. Swan-Ganz catheter
tip is in the proximal right main pulmonary artery. Nasogastric tube
tip and side port are below the diaphragm. Temporary pacemaker leads
are attached to the right heart. No pneumothorax. There is mild
bibasilar atelectatic change. Lungs are otherwise clear. Heart is
upper normal in size with pulmonary vascularity within normal
limits. No adenopathy. No bone lesions.
IMPRESSION: Tube and catheter positions as described without pneumothorax. Mild
bibasilar atelectatic change. Elsewhere lungs are clear.

## 2015-08-17 IMAGING — CR DG CHEST 1V PORT
1 series · 1 of 1 positions shown · non-contrast
Comparison: August 20, 2013

CLINICAL DATA: Coronary artery disease

EXAM:
PORTABLE CHEST - 1 VIEW

[portable]
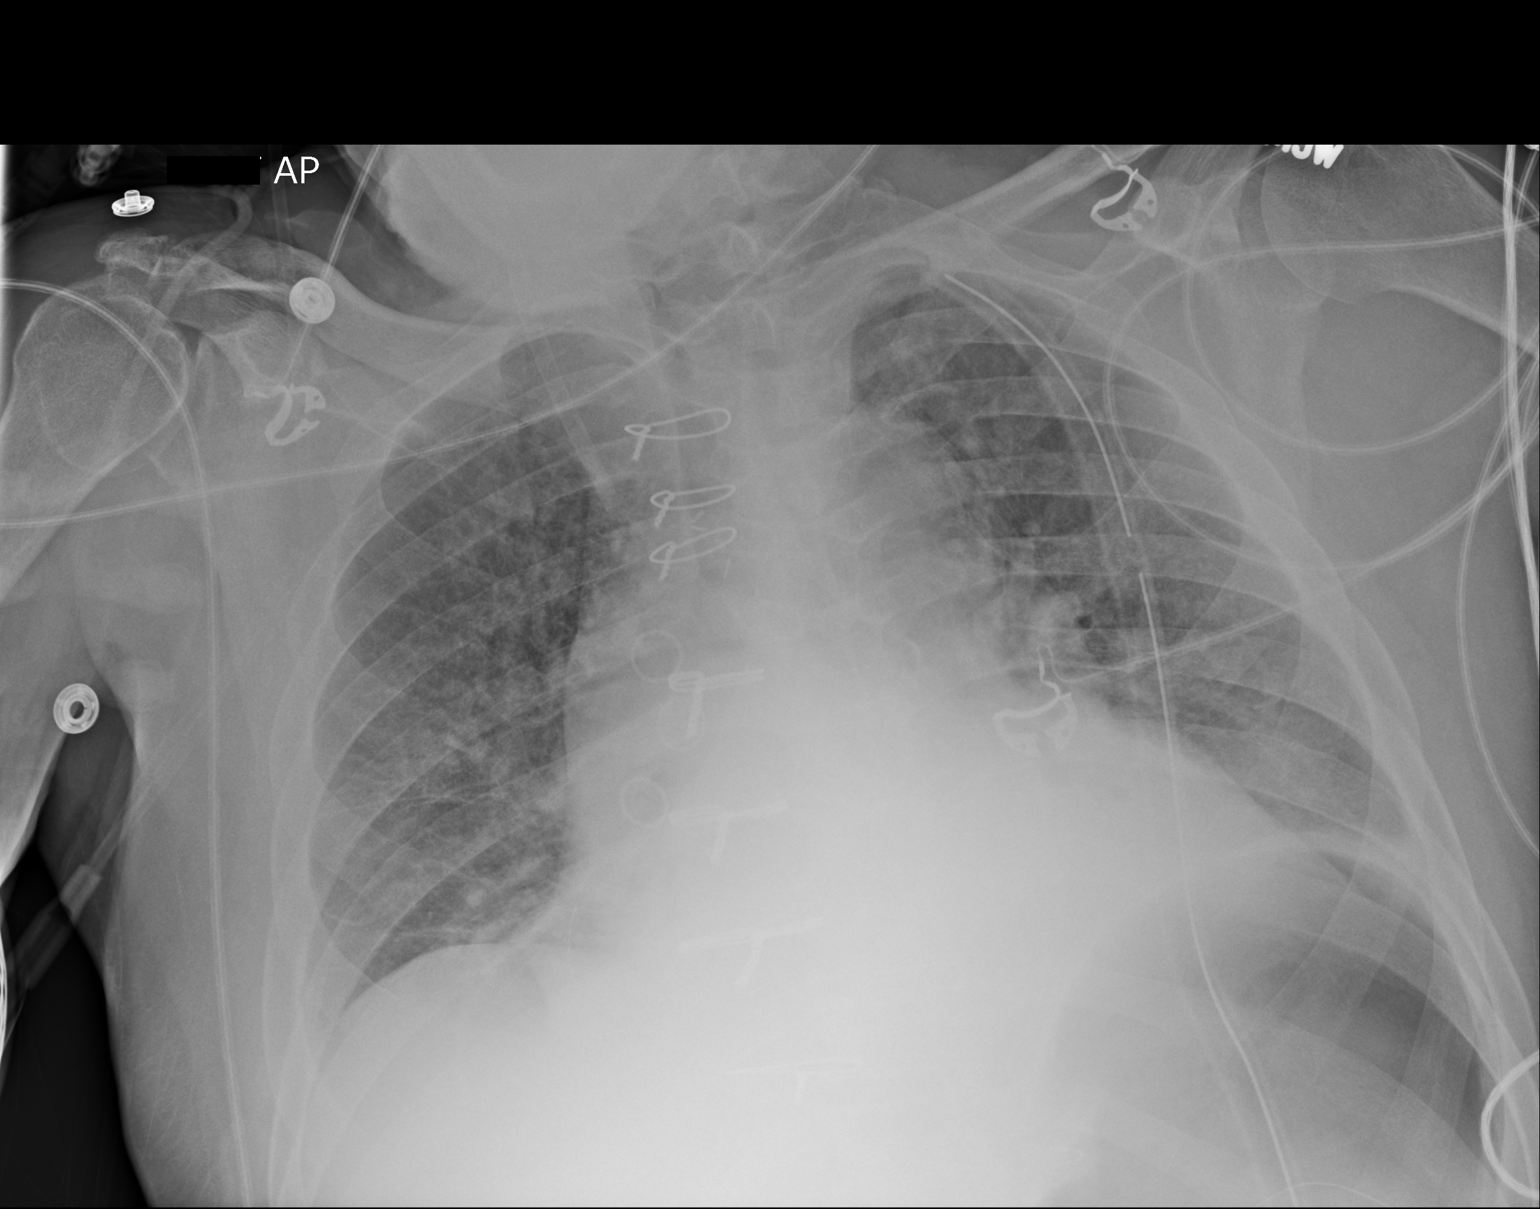

[1 of 1 positions shown; findings below may reference images not displayed]

FINDINGS: Swan-Ganz catheter has been removed. Cordis tip is in the superior
vena cava. Chest tube is present on the left, unchanged. There is no
appreciable pneumothorax.

There is persistent left lower lobe consolidation. There is also
consolidation in the medial right base, stable. There is trace
interstitial edema. Heart is mildly enlarged. The pulmonary
vascularity is normal. No adenopathy.
IMPRESSION: Bilateral lower lobe consolidation, more on the left than on the
right, stable. Trace interstitial edema. Stable cardiac enlargement.
Tube and catheter positions as described without apparent
pneumothorax.

## 2015-08-18 IMAGING — CR DG CHEST 1V PORT
1 series · 1 of 1 positions shown · non-contrast
Comparison: 08/21/2013

CLINICAL DATA: Pulmonary infiltrates.  Left-sided chest tube.

EXAM:
PORTABLE CHEST - 1 VIEW

[portable]
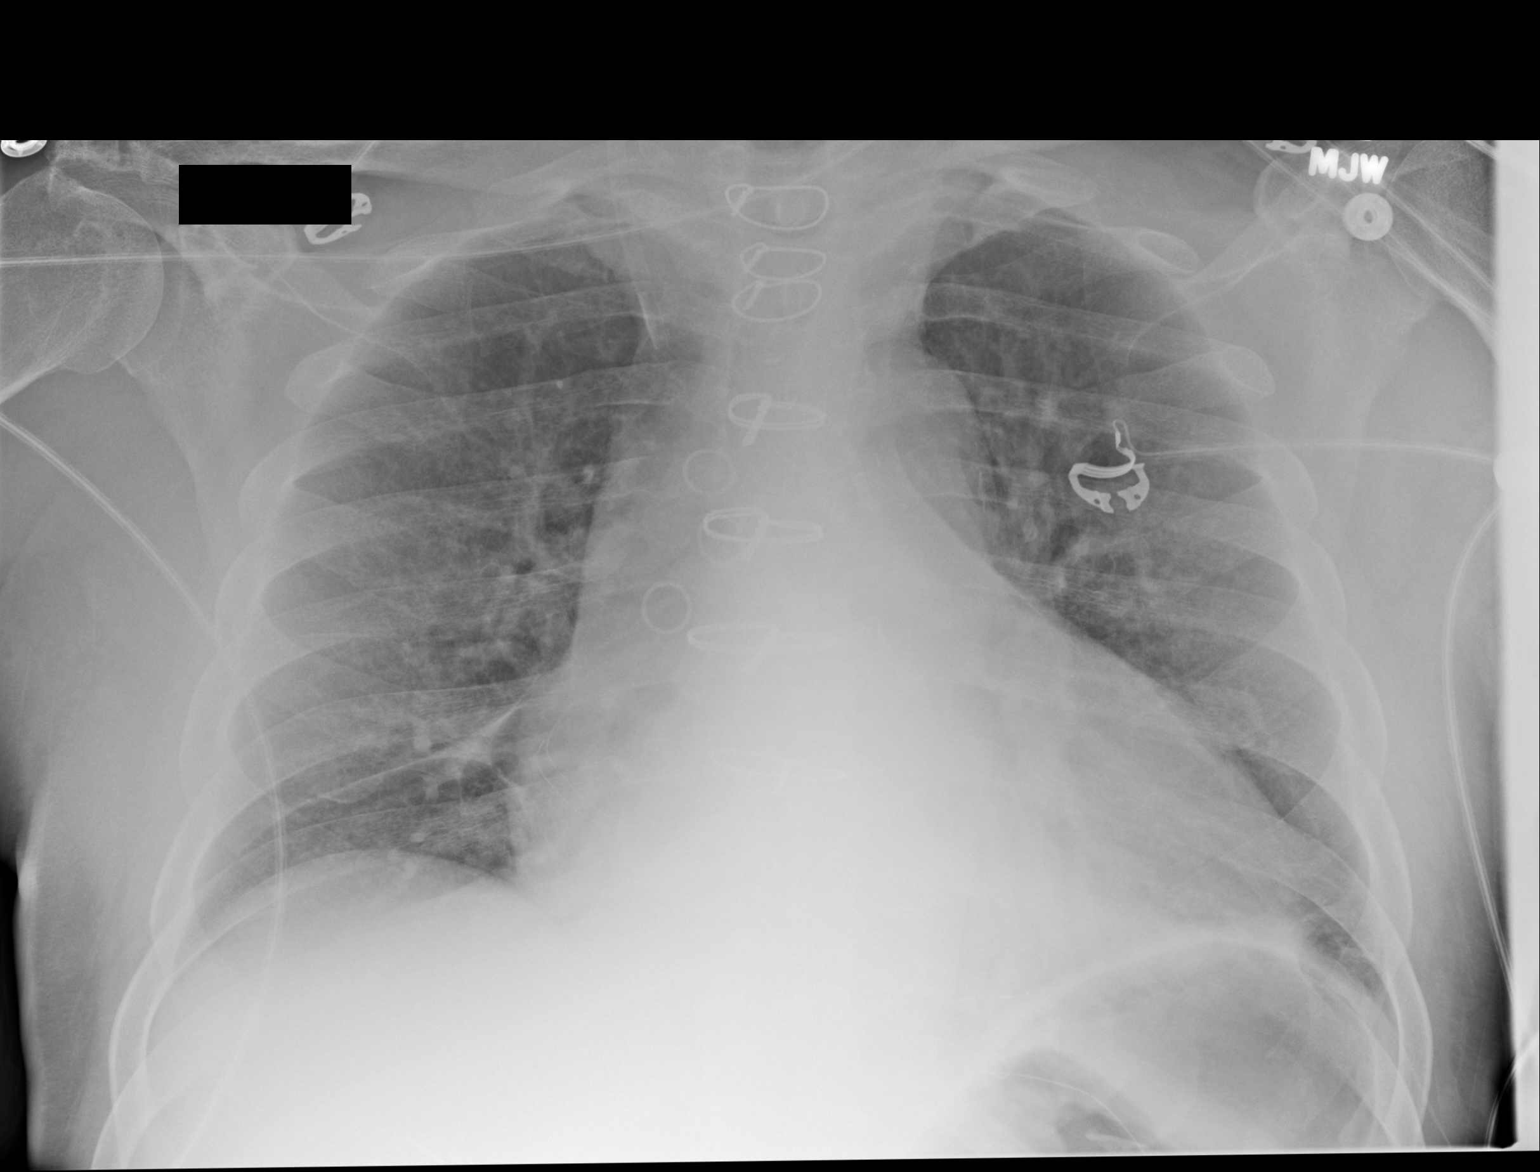

[1 of 1 positions shown; findings below may reference images not displayed]

FINDINGS: Left-sided chest tube has been removed. No pneumothorax. Sheath
remains in the superior vena cava in good position.

Improved aeration at both lung bases with minimal residual
atelectasis at the left base posterior medially. No effusions.

Pulmonary vascularity is normal. Slight prominence of cardiac
silhouette. CABG.
IMPRESSION: 1. Improved aeration at both bases with slight residual atelectasis
on the left.
2. No pneumothorax after left chest tube removal.

## 2015-08-19 IMAGING — CR DG CHEST 1V PORT
1 series · 3 of 3 positions shown · non-contrast
Comparison: 08/23/2013

CLINICAL DATA: Followup left pneumothorax. Status post chest tube
placement.

EXAM:
PORTABLE CHEST - 1 VIEW

[Series 2: portable · 0.17mm/px · 3 of 3 slices shown]
[im 1/3]
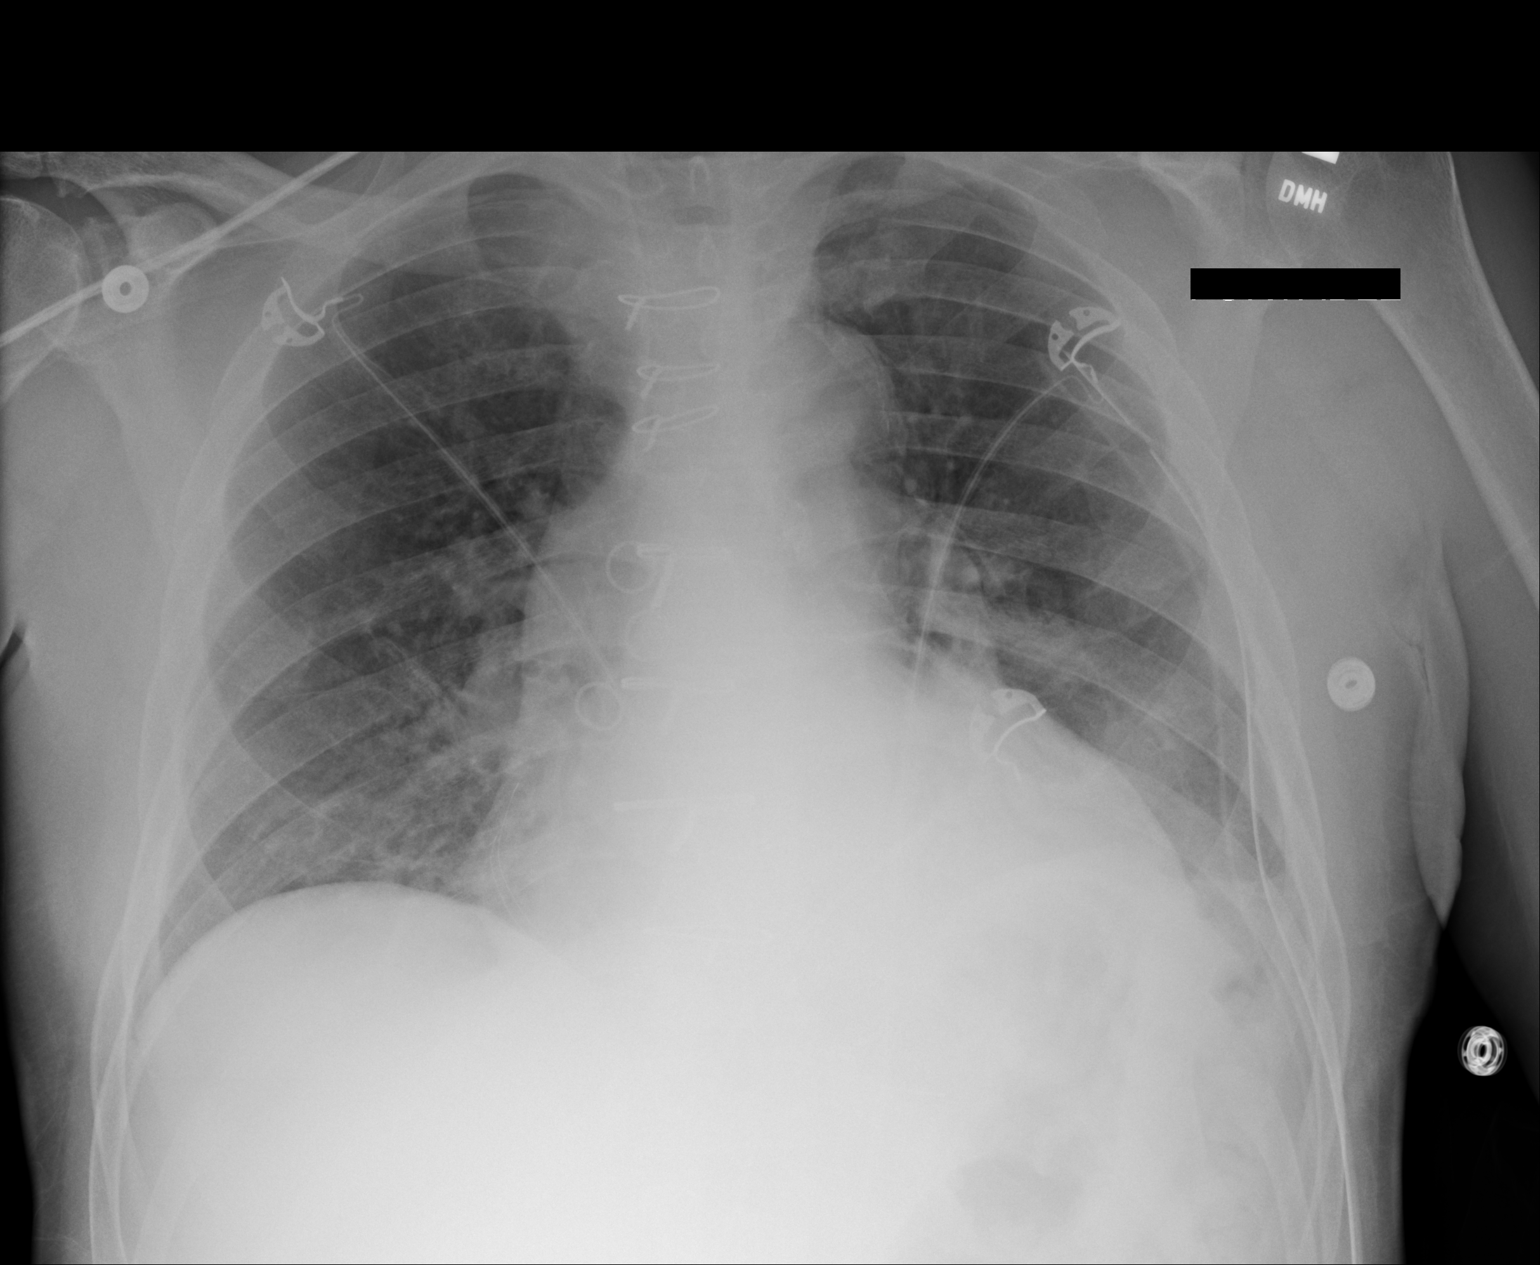
[im 2/3]
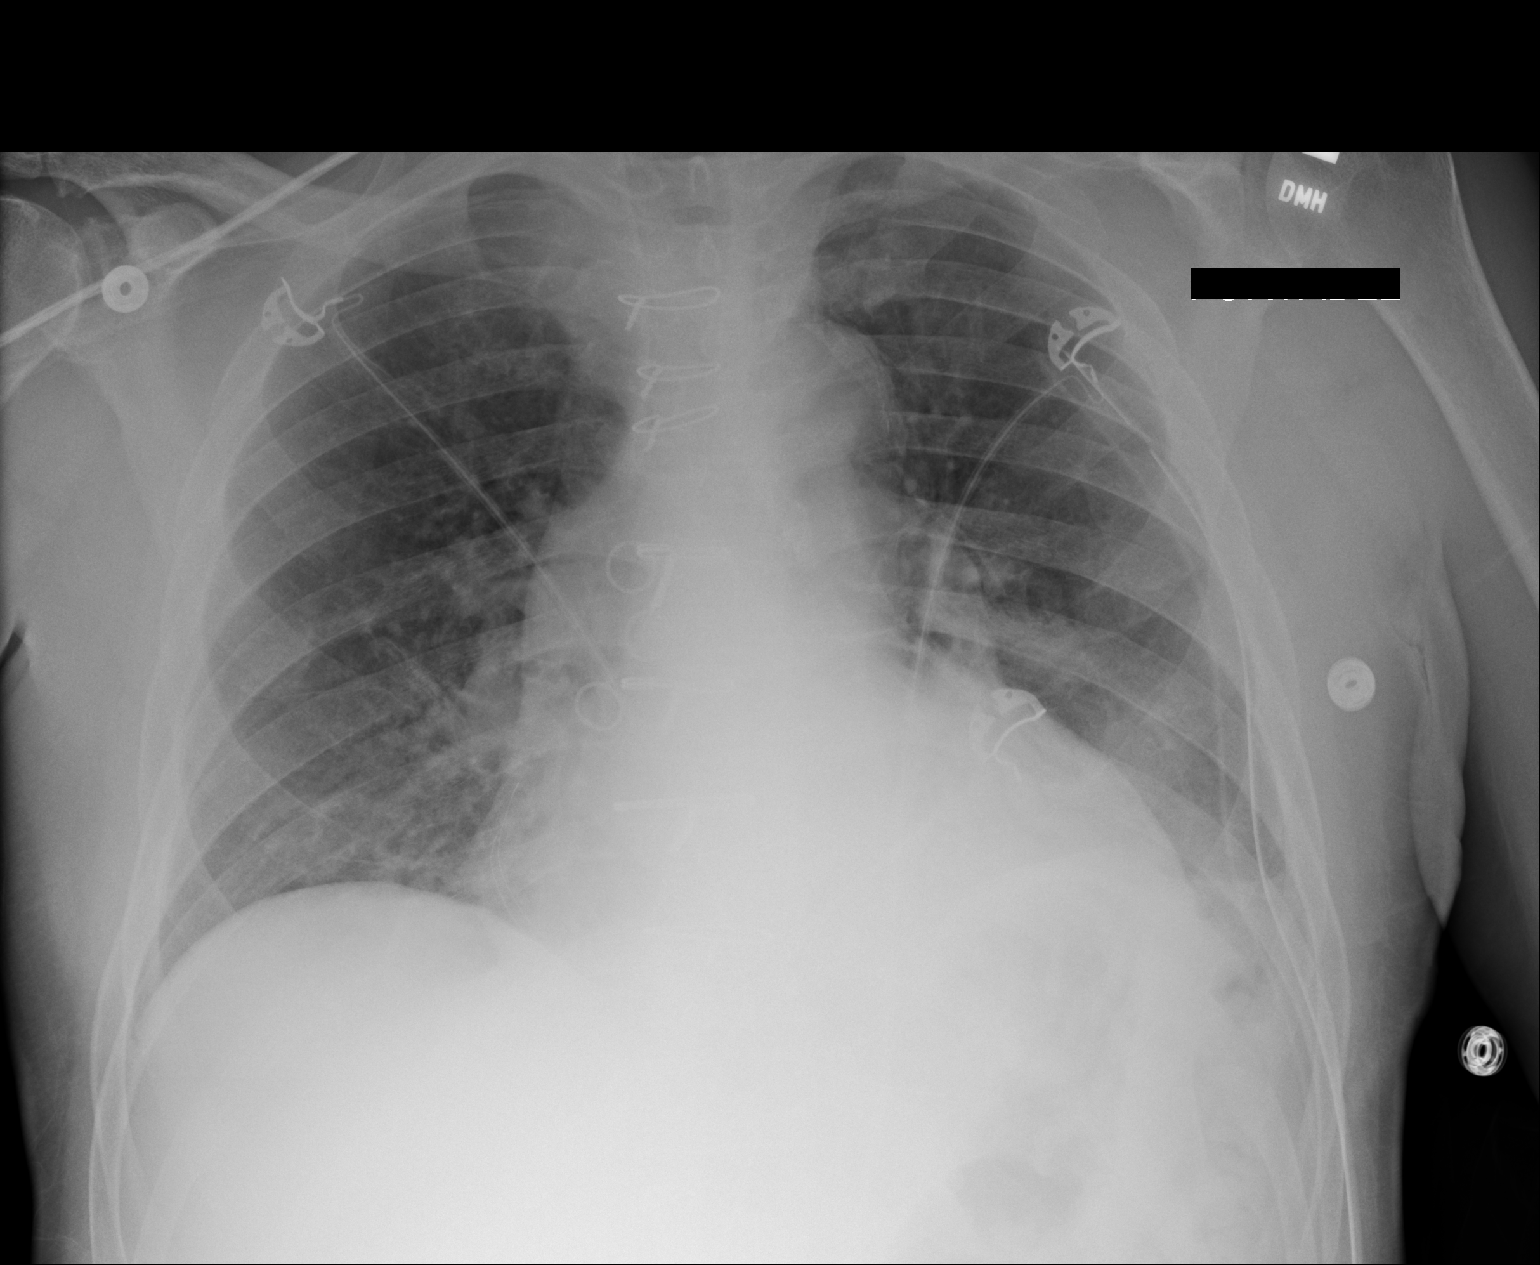
[im 3/3]
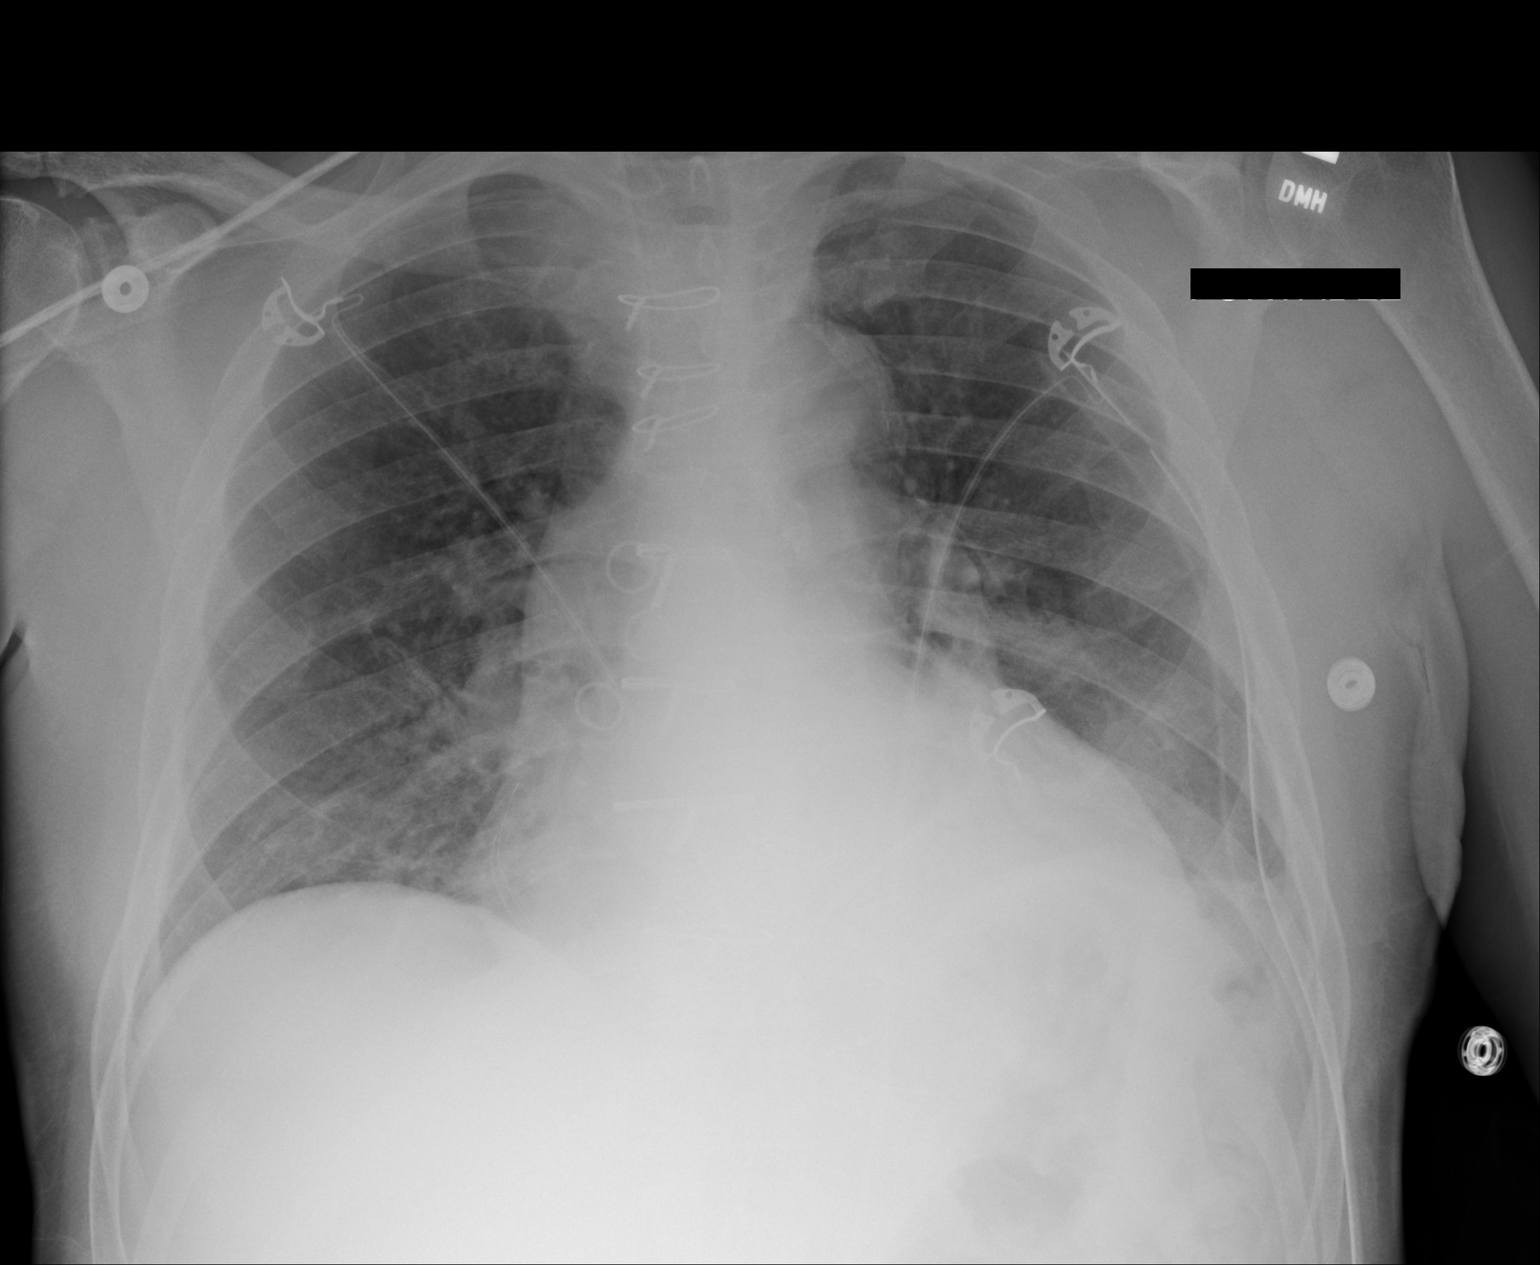

[3 of 3 positions shown; findings below may reference images not displayed]

FINDINGS: A new left chest tube is seen. There is re-expansion of the left
lung with only a small residual left apical pneumothorax visualized.
Opacity is seen in the left retrocardiac lung base which may be due
to atelectasis or infiltrate.

Cardiomegaly stable. No evidence of congestive heart failure. Prior
CABG again noted.
IMPRESSION: Near complete resolution of left pneumothorax following left chest
tube placement.

Left retrocardiac atelectasis versus infiltrate.

Stable cardiomegaly.
